# Patient Record
Sex: Female | Born: 1945
Health system: Southern US, Community
[De-identification: ages and names within clinical notes are randomized; demographics above are authoritative.]

## PROBLEM LIST (undated history)

## (undated) DIAGNOSIS — I639 Cerebral infarction, unspecified: Secondary | ICD-10-CM

## (undated) DIAGNOSIS — R9431 Abnormal electrocardiogram [ECG] [EKG]: Secondary | ICD-10-CM

## (undated) DIAGNOSIS — N189 Chronic kidney disease, unspecified: Secondary | ICD-10-CM

## (undated) DIAGNOSIS — R569 Unspecified convulsions: Secondary | ICD-10-CM

## (undated) DIAGNOSIS — J101 Influenza due to other identified influenza virus with other respiratory manifestations: Secondary | ICD-10-CM

## (undated) DIAGNOSIS — C50919 Malignant neoplasm of unspecified site of unspecified female breast: Secondary | ICD-10-CM

## (undated) DIAGNOSIS — K760 Fatty (change of) liver, not elsewhere classified: Secondary | ICD-10-CM

## (undated) DIAGNOSIS — I10 Essential (primary) hypertension: Secondary | ICD-10-CM

## (undated) DIAGNOSIS — T7840XA Allergy, unspecified, initial encounter: Secondary | ICD-10-CM

## (undated) DIAGNOSIS — K219 Gastro-esophageal reflux disease without esophagitis: Secondary | ICD-10-CM

## (undated) DIAGNOSIS — E7800 Pure hypercholesterolemia, unspecified: Secondary | ICD-10-CM

## (undated) DIAGNOSIS — Z923 Personal history of irradiation: Secondary | ICD-10-CM

## (undated) DIAGNOSIS — D352 Benign neoplasm of pituitary gland: Secondary | ICD-10-CM

## (undated) DIAGNOSIS — I4891 Unspecified atrial fibrillation: Secondary | ICD-10-CM

## (undated) DIAGNOSIS — M1990 Unspecified osteoarthritis, unspecified site: Secondary | ICD-10-CM

## (undated) DIAGNOSIS — E079 Disorder of thyroid, unspecified: Secondary | ICD-10-CM

## (undated) HISTORY — PX: UMBILICAL HERNIA REPAIR: SHX196

## (undated) HISTORY — PX: COLONOSCOPY: SHX174

## (undated) HISTORY — DX: Abnormal electrocardiogram (ECG) (EKG): R94.31

## (undated) HISTORY — PX: BREAST LUMPECTOMY: SHX2

## (undated) HISTORY — DX: Essential (primary) hypertension: I10

## (undated) HISTORY — DX: Unspecified atrial fibrillation: I48.91

## (undated) HISTORY — DX: Pure hypercholesterolemia, unspecified: E78.00

## (undated) HISTORY — DX: Fatty (change of) liver, not elsewhere classified: K76.0

## (undated) HISTORY — DX: Malignant neoplasm of unspecified site of unspecified female breast: C50.919

## (undated) HISTORY — DX: Unspecified convulsions: R56.9

## (undated) HISTORY — PX: PITUITARY SURGERY: SHX203

## (undated) HISTORY — DX: Cerebral infarction, unspecified: I63.9

## (undated) HISTORY — PX: SCAR REVISION: SHX5285

## (undated) HISTORY — DX: Gastro-esophageal reflux disease without esophagitis: K21.9

## (undated) HISTORY — DX: Disorder of thyroid, unspecified: E07.9

## (undated) HISTORY — DX: Influenza due to other identified influenza virus with other respiratory manifestations: J10.1

## (undated) HISTORY — DX: Chronic kidney disease, unspecified: N18.9

## (undated) HISTORY — PX: TUBAL LIGATION: SHX77

## (undated) HISTORY — DX: Allergy, unspecified, initial encounter: T78.40XA

## (undated) HISTORY — DX: Unspecified osteoarthritis, unspecified site: M19.90

---

## 1983-09-11 HISTORY — PX: RHINOPLASTY: SHX2354

## 1985-09-10 HISTORY — PX: BREAST ENHANCEMENT SURGERY: SHX7

## 2006-10-12 ENCOUNTER — Encounter: Admission: RE | Admit: 2006-10-12 | Discharge: 2006-10-12 | Payer: Self-pay | Admitting: Endocrinology

## 2007-10-30 ENCOUNTER — Encounter: Admission: RE | Admit: 2007-10-30 | Discharge: 2007-10-30 | Payer: Self-pay | Admitting: Endocrinology

## 2008-11-01 ENCOUNTER — Encounter: Admission: RE | Admit: 2008-11-01 | Discharge: 2008-11-01 | Payer: Self-pay | Admitting: Endocrinology

## 2009-02-28 ENCOUNTER — Ambulatory Visit: Payer: Self-pay | Admitting: Occupational Medicine

## 2009-02-28 DIAGNOSIS — L500 Allergic urticaria: Secondary | ICD-10-CM | POA: Insufficient documentation

## 2009-02-28 DIAGNOSIS — E785 Hyperlipidemia, unspecified: Secondary | ICD-10-CM | POA: Insufficient documentation

## 2009-02-28 DIAGNOSIS — I10 Essential (primary) hypertension: Secondary | ICD-10-CM | POA: Insufficient documentation

## 2009-09-10 DIAGNOSIS — C50919 Malignant neoplasm of unspecified site of unspecified female breast: Secondary | ICD-10-CM

## 2009-09-10 HISTORY — DX: Malignant neoplasm of unspecified site of unspecified female breast: C50.919

## 2009-10-31 ENCOUNTER — Encounter: Admission: RE | Admit: 2009-10-31 | Discharge: 2009-10-31 | Payer: Self-pay | Admitting: Internal Medicine

## 2009-11-08 ENCOUNTER — Encounter: Admission: RE | Admit: 2009-11-08 | Discharge: 2009-11-08 | Payer: Self-pay | Admitting: Internal Medicine

## 2009-11-16 ENCOUNTER — Ambulatory Visit: Payer: Self-pay | Admitting: Oncology

## 2009-11-23 LAB — CANCER ANTIGEN 27.29: CA 27.29: 26 U/mL (ref 0–39)

## 2009-11-23 LAB — CBC WITH DIFFERENTIAL/PLATELET
BASO%: 0.7 % (ref 0.0–2.0)
EOS%: 1.8 % (ref 0.0–7.0)
LYMPH%: 25.3 % (ref 14.0–49.7)
MCHC: 35.5 g/dL (ref 31.5–36.0)
MONO#: 0.7 10*3/uL (ref 0.1–0.9)
RBC: 4.57 10*6/uL (ref 3.70–5.45)
WBC: 7.2 10*3/uL (ref 3.9–10.3)
lymph#: 1.8 10*3/uL (ref 0.9–3.3)

## 2009-11-23 LAB — COMPREHENSIVE METABOLIC PANEL
ALT: 65 U/L — ABNORMAL HIGH (ref 0–35)
AST: 49 U/L — ABNORMAL HIGH (ref 0–37)
CO2: 30 mEq/L (ref 19–32)
Chloride: 101 mEq/L (ref 96–112)
Sodium: 138 mEq/L (ref 135–145)
Total Bilirubin: 1.1 mg/dL (ref 0.3–1.2)
Total Protein: 7.3 g/dL (ref 6.0–8.3)

## 2009-12-01 ENCOUNTER — Ambulatory Visit (HOSPITAL_COMMUNITY): Admission: RE | Admit: 2009-12-01 | Discharge: 2009-12-01 | Payer: Self-pay | Admitting: Surgery

## 2009-12-08 ENCOUNTER — Ambulatory Visit (HOSPITAL_COMMUNITY): Admission: RE | Admit: 2009-12-08 | Discharge: 2009-12-08 | Payer: Self-pay | Admitting: Oncology

## 2009-12-15 ENCOUNTER — Ambulatory Visit: Payer: Self-pay | Admitting: Oncology

## 2009-12-15 LAB — CBC WITH DIFFERENTIAL/PLATELET
BASO%: 1.3 % (ref 0.0–2.0)
Basophils Absolute: 0.1 10*3/uL (ref 0.0–0.1)
EOS%: 1.5 % (ref 0.0–7.0)
HGB: 15.7 g/dL (ref 11.6–15.9)
MCH: 33.7 pg (ref 25.1–34.0)
MONO%: 26.8 % — ABNORMAL HIGH (ref 0.0–14.0)
RBC: 4.66 10*6/uL (ref 3.70–5.45)
RDW: 11.8 % (ref 11.2–14.5)
lymph#: 1.2 10*3/uL (ref 0.9–3.3)
nRBC: 0 % (ref 0–0)

## 2009-12-22 LAB — CBC WITH DIFFERENTIAL/PLATELET
BASO%: 1.1 % (ref 0.0–2.0)
Eosinophils Absolute: 0 10*3/uL (ref 0.0–0.5)
MCHC: 34.7 g/dL (ref 31.5–36.0)
MONO#: 0.7 10*3/uL (ref 0.1–0.9)
NEUT#: 8.5 10*3/uL — ABNORMAL HIGH (ref 1.5–6.5)
RBC: 3.94 10*6/uL (ref 3.70–5.45)
RDW: 12.3 % (ref 11.2–14.5)
WBC: 11.1 10*3/uL — ABNORMAL HIGH (ref 3.9–10.3)
lymph#: 1.7 10*3/uL (ref 0.9–3.3)

## 2009-12-22 LAB — URINALYSIS, MICROSCOPIC - CHCC
Bilirubin (Urine): NEGATIVE
Glucose: NEGATIVE g/dL
Nitrite: NEGATIVE
Specific Gravity, Urine: 1.01 (ref 1.003–1.035)

## 2009-12-23 LAB — URINE CULTURE

## 2009-12-29 LAB — CBC WITH DIFFERENTIAL/PLATELET
BASO%: 0 % (ref 0.0–2.0)
Basophils Absolute: 0 10*3/uL (ref 0.0–0.1)
EOS%: 0 % (ref 0.0–7.0)
HCT: 42.4 % (ref 34.8–46.6)
HGB: 14.8 g/dL (ref 11.6–15.9)
MCH: 33.6 pg (ref 25.1–34.0)
MCHC: 34.9 g/dL (ref 31.5–36.0)
MCV: 96.4 fL (ref 79.5–101.0)
MONO%: 4.7 % (ref 0.0–14.0)
NEUT%: 90.3 % — ABNORMAL HIGH (ref 38.4–76.8)

## 2009-12-29 LAB — COMPREHENSIVE METABOLIC PANEL
ALT: 51 U/L — ABNORMAL HIGH (ref 0–35)
AST: 31 U/L (ref 0–37)
Albumin: 4.2 g/dL (ref 3.5–5.2)
Alkaline Phosphatase: 153 U/L — ABNORMAL HIGH (ref 39–117)
BUN: 14 mg/dL (ref 6–23)
Calcium: 9.5 mg/dL (ref 8.4–10.5)
Chloride: 100 mEq/L (ref 96–112)
Creatinine, Ser: 0.76 mg/dL (ref 0.40–1.20)
Potassium: 3.9 mEq/L (ref 3.5–5.3)

## 2009-12-29 LAB — URINALYSIS, MICROSCOPIC - CHCC
Leukocyte Esterase: NEGATIVE
Nitrite: NEGATIVE
Protein: NEGATIVE mg/dL
RBC count: NEGATIVE (ref 0–2)
pH: 6.5 (ref 4.6–8.0)

## 2010-01-05 LAB — CBC WITH DIFFERENTIAL/PLATELET
Basophils Absolute: 0.2 10*3/uL — ABNORMAL HIGH (ref 0.0–0.1)
EOS%: 1.8 % (ref 0.0–7.0)
HCT: 42 % (ref 34.8–46.6)
HGB: 14.8 g/dL (ref 11.6–15.9)
LYMPH%: 30.7 % (ref 14.0–49.7)
MCH: 34 pg (ref 25.1–34.0)
MCV: 96.6 fL (ref 79.5–101.0)
NEUT%: 43.1 % (ref 38.4–76.8)
Platelets: 171 10*3/uL (ref 145–400)
lymph#: 1.5 10*3/uL (ref 0.9–3.3)

## 2010-01-18 ENCOUNTER — Ambulatory Visit: Payer: Self-pay | Admitting: Oncology

## 2010-01-19 ENCOUNTER — Ambulatory Visit (HOSPITAL_COMMUNITY): Admission: RE | Admit: 2010-01-19 | Discharge: 2010-01-19 | Payer: Self-pay | Admitting: Oncology

## 2010-01-19 LAB — CBC WITH DIFFERENTIAL/PLATELET
Basophils Absolute: 0 10*3/uL (ref 0.0–0.1)
EOS%: 0 % (ref 0.0–7.0)
HGB: 14.4 g/dL (ref 11.6–15.9)
MCH: 34.2 pg — ABNORMAL HIGH (ref 25.1–34.0)
MCV: 96.2 fL (ref 79.5–101.0)
MONO%: 5.3 % (ref 0.0–14.0)
NEUT%: 89.8 % — ABNORMAL HIGH (ref 38.4–76.8)
RDW: 13.9 % (ref 11.2–14.5)

## 2010-01-19 LAB — COMPREHENSIVE METABOLIC PANEL
ALT: 38 U/L — ABNORMAL HIGH (ref 0–35)
CO2: 21 mEq/L (ref 19–32)
Calcium: 9 mg/dL (ref 8.4–10.5)
Chloride: 100 mEq/L (ref 96–112)
Sodium: 137 mEq/L (ref 135–145)
Total Protein: 6.6 g/dL (ref 6.0–8.3)

## 2010-01-26 LAB — CBC WITH DIFFERENTIAL/PLATELET
BASO%: 0.9 % (ref 0.0–2.0)
EOS%: 2.1 % (ref 0.0–7.0)
LYMPH%: 37.8 % (ref 14.0–49.7)
MCH: 34 pg (ref 25.1–34.0)
MCHC: 35.8 g/dL (ref 31.5–36.0)
MONO#: 0.8 10*3/uL (ref 0.1–0.9)
Platelets: 145 10*3/uL (ref 145–400)
RBC: 4.35 10*6/uL (ref 3.70–5.45)
WBC: 3.3 10*3/uL — ABNORMAL LOW (ref 3.9–10.3)
lymph#: 1.2 10*3/uL (ref 0.9–3.3)
nRBC: 1 % — ABNORMAL HIGH (ref 0–0)

## 2010-02-08 ENCOUNTER — Ambulatory Visit: Payer: Self-pay | Admitting: Oncology

## 2010-02-09 LAB — CBC WITH DIFFERENTIAL/PLATELET
Basophils Absolute: 0 10*3/uL (ref 0.0–0.1)
EOS%: 0.2 % (ref 0.0–7.0)
Eosinophils Absolute: 0 10*3/uL (ref 0.0–0.5)
LYMPH%: 3 % — ABNORMAL LOW (ref 14.0–49.7)
MCH: 34.2 pg — ABNORMAL HIGH (ref 25.1–34.0)
MCV: 99.8 fL (ref 79.5–101.0)
MONO%: 2.7 % (ref 0.0–14.0)
NEUT#: 14.4 10*3/uL — ABNORMAL HIGH (ref 1.5–6.5)
Platelets: 235 10*3/uL (ref 145–400)
RBC: 4.09 10*6/uL (ref 3.70–5.45)
nRBC: 0 % (ref 0–0)

## 2010-02-09 LAB — COMPREHENSIVE METABOLIC PANEL
AST: 23 U/L (ref 0–37)
Alkaline Phosphatase: 125 U/L — ABNORMAL HIGH (ref 39–117)
Glucose, Bld: 204 mg/dL — ABNORMAL HIGH (ref 70–99)
Potassium: 4.2 mEq/L (ref 3.5–5.3)
Sodium: 136 mEq/L (ref 135–145)
Total Bilirubin: 0.7 mg/dL (ref 0.3–1.2)
Total Protein: 6.3 g/dL (ref 6.0–8.3)

## 2010-02-15 ENCOUNTER — Encounter: Admission: RE | Admit: 2010-02-15 | Discharge: 2010-02-15 | Payer: Self-pay | Admitting: Oncology

## 2010-03-02 LAB — COMPREHENSIVE METABOLIC PANEL
ALT: 38 U/L — ABNORMAL HIGH (ref 0–35)
AST: 30 U/L (ref 0–37)
Alkaline Phosphatase: 125 U/L — ABNORMAL HIGH (ref 39–117)
Creatinine, Ser: 0.76 mg/dL (ref 0.40–1.20)
Total Bilirubin: 1 mg/dL (ref 0.3–1.2)

## 2010-03-02 LAB — CBC WITH DIFFERENTIAL/PLATELET
BASO%: 0 % (ref 0.0–2.0)
EOS%: 0 % (ref 0.0–7.0)
HCT: 38 % (ref 34.8–46.6)
LYMPH%: 3.5 % — ABNORMAL LOW (ref 14.0–49.7)
MCH: 34.6 pg — ABNORMAL HIGH (ref 25.1–34.0)
MCHC: 35 g/dL (ref 31.5–36.0)
NEUT%: 93.1 % — ABNORMAL HIGH (ref 38.4–76.8)
Platelets: 219 10*3/uL (ref 145–400)
RBC: 3.84 10*6/uL (ref 3.70–5.45)

## 2010-03-09 LAB — CBC WITH DIFFERENTIAL/PLATELET
BASO%: 0.8 % (ref 0.0–2.0)
HCT: 39.4 % (ref 34.8–46.6)
LYMPH%: 35.2 % (ref 14.0–49.7)
MCH: 34.3 pg — ABNORMAL HIGH (ref 25.1–34.0)
MCHC: 34.8 g/dL (ref 31.5–36.0)
MCV: 98.5 fL (ref 79.5–101.0)
MONO#: 0.8 10*3/uL (ref 0.1–0.9)
NEUT%: 32.2 % — ABNORMAL LOW (ref 38.4–76.8)
Platelets: 121 10*3/uL — ABNORMAL LOW (ref 145–400)

## 2010-03-23 ENCOUNTER — Ambulatory Visit: Payer: Self-pay | Admitting: Oncology

## 2010-03-23 LAB — COMPREHENSIVE METABOLIC PANEL
ALT: 36 U/L — ABNORMAL HIGH (ref 0–35)
AST: 32 U/L (ref 0–37)
Albumin: 4.1 g/dL (ref 3.5–5.2)
Alkaline Phosphatase: 129 U/L — ABNORMAL HIGH (ref 39–117)
BUN: 14 mg/dL (ref 6–23)
Calcium: 9 mg/dL (ref 8.4–10.5)
Chloride: 100 mEq/L (ref 96–112)
Creatinine, Ser: 0.65 mg/dL (ref 0.40–1.20)
Potassium: 4.4 mEq/L (ref 3.5–5.3)

## 2010-03-23 LAB — CBC WITH DIFFERENTIAL/PLATELET
BASO%: 0.2 % (ref 0.0–2.0)
Basophils Absolute: 0 10*3/uL (ref 0.0–0.1)
EOS%: 0 % (ref 0.0–7.0)
HCT: 41.1 % (ref 34.8–46.6)
HGB: 14.2 g/dL (ref 11.6–15.9)
MCH: 34.9 pg — ABNORMAL HIGH (ref 25.1–34.0)
MCHC: 34.5 g/dL (ref 31.5–36.0)
MCV: 101 fL (ref 79.5–101.0)
MONO%: 3 % (ref 0.0–14.0)
NEUT%: 92.2 % — ABNORMAL HIGH (ref 38.4–76.8)
lymph#: 0.4 10*3/uL — ABNORMAL LOW (ref 0.9–3.3)

## 2010-03-30 LAB — CBC WITH DIFFERENTIAL/PLATELET
BASO%: 0.8 % (ref 0.0–2.0)
EOS%: 0.6 % (ref 0.0–7.0)
LYMPH%: 28.8 % (ref 14.0–49.7)
MCH: 34.6 pg — ABNORMAL HIGH (ref 25.1–34.0)
MCHC: 34.4 g/dL (ref 31.5–36.0)
MCV: 100.5 fL (ref 79.5–101.0)
MONO%: 24 % — ABNORMAL HIGH (ref 0.0–14.0)
Platelets: 124 10*3/uL — ABNORMAL LOW (ref 145–400)
RBC: 4.05 10*6/uL (ref 3.70–5.45)
RDW: 14.4 % (ref 11.2–14.5)

## 2010-04-10 ENCOUNTER — Encounter: Admission: RE | Admit: 2010-04-10 | Discharge: 2010-04-10 | Payer: Self-pay | Admitting: Surgery

## 2010-04-13 ENCOUNTER — Ambulatory Visit (HOSPITAL_BASED_OUTPATIENT_CLINIC_OR_DEPARTMENT_OTHER): Admission: RE | Admit: 2010-04-13 | Discharge: 2010-04-14 | Payer: Self-pay | Admitting: Surgery

## 2010-04-13 ENCOUNTER — Encounter: Admission: RE | Admit: 2010-04-13 | Discharge: 2010-04-13 | Payer: Self-pay | Admitting: Surgery

## 2010-04-28 ENCOUNTER — Ambulatory Visit: Payer: Self-pay | Admitting: Oncology

## 2010-04-28 ENCOUNTER — Ambulatory Visit: Admission: RE | Admit: 2010-04-28 | Discharge: 2010-04-28 | Payer: Self-pay | Admitting: Oncology

## 2010-04-28 ENCOUNTER — Encounter: Payer: Self-pay | Admitting: Oncology

## 2010-04-28 ENCOUNTER — Ambulatory Visit: Payer: Self-pay | Admitting: Surgery

## 2010-04-28 LAB — URINALYSIS, MICROSCOPIC - CHCC
Blood: NEGATIVE
Glucose: NEGATIVE g/dL
Protein: NEGATIVE mg/dL
pH: 6 (ref 4.6–8.0)

## 2010-04-28 LAB — CBC WITH DIFFERENTIAL/PLATELET
BASO%: 0.9 % (ref 0.0–2.0)
Basophils Absolute: 0.1 10*3/uL (ref 0.0–0.1)
Eosinophils Absolute: 0.2 10*3/uL (ref 0.0–0.5)
MCH: 35.8 pg — ABNORMAL HIGH (ref 25.1–34.0)
MCHC: 35.2 g/dL (ref 31.5–36.0)
RBC: 3.91 10*6/uL (ref 3.70–5.45)
RDW: 14.6 % — ABNORMAL HIGH (ref 11.2–14.5)
lymph#: 0.8 10*3/uL — ABNORMAL LOW (ref 0.9–3.3)

## 2010-04-28 LAB — COMPREHENSIVE METABOLIC PANEL
AST: 27 U/L (ref 0–37)
Alkaline Phosphatase: 116 U/L (ref 39–117)
Chloride: 101 mEq/L (ref 96–112)
Creatinine, Ser: 0.7 mg/dL (ref 0.40–1.20)
Glucose, Bld: 83 mg/dL (ref 70–99)
Total Protein: 6.4 g/dL (ref 6.0–8.3)

## 2010-04-30 LAB — URINE CULTURE

## 2010-05-02 ENCOUNTER — Ambulatory Visit: Admission: RE | Admit: 2010-05-02 | Discharge: 2010-07-11 | Payer: Self-pay | Admitting: Radiation Oncology

## 2010-05-09 LAB — CBC WITH DIFFERENTIAL/PLATELET
BASO%: 1.1 % (ref 0.0–2.0)
EOS%: 3.5 % (ref 0.0–7.0)
MCH: 34.6 pg — ABNORMAL HIGH (ref 25.1–34.0)
MCHC: 34.2 g/dL (ref 31.5–36.0)
MCV: 101.2 fL — ABNORMAL HIGH (ref 79.5–101.0)
MONO%: 11.8 % (ref 0.0–14.0)
RDW: 13.6 % (ref 11.2–14.5)
lymph#: 0.9 10*3/uL (ref 0.9–3.3)

## 2010-05-15 ENCOUNTER — Ambulatory Visit: Payer: Self-pay | Admitting: Family Medicine

## 2010-05-15 DIAGNOSIS — N3000 Acute cystitis without hematuria: Secondary | ICD-10-CM | POA: Insufficient documentation

## 2010-05-15 LAB — CONVERTED CEMR LAB
Glucose, Urine, Semiquant: NEGATIVE
Ketones, urine, test strip: NEGATIVE
Nitrite: NEGATIVE
Protein, U semiquant: NEGATIVE
Urobilinogen, UA: 0.2

## 2010-05-16 ENCOUNTER — Encounter: Payer: Self-pay | Admitting: Family Medicine

## 2010-05-18 ENCOUNTER — Telehealth (INDEPENDENT_AMBULATORY_CARE_PROVIDER_SITE_OTHER): Payer: Self-pay | Admitting: *Deleted

## 2010-07-06 ENCOUNTER — Ambulatory Visit: Payer: Self-pay | Admitting: Oncology

## 2010-07-10 LAB — CBC WITH DIFFERENTIAL/PLATELET
BASO%: 0.8 % (ref 0.0–2.0)
Basophils Absolute: 0 10*3/uL (ref 0.0–0.1)
EOS%: 8.6 % — ABNORMAL HIGH (ref 0.0–7.0)
Eosinophils Absolute: 0.4 10*3/uL (ref 0.0–0.5)
HCT: 43 % (ref 34.8–46.6)
HGB: 15 g/dL (ref 11.6–15.9)
LYMPH%: 12.7 % — ABNORMAL LOW (ref 14.0–49.7)
MCH: 33 pg (ref 25.1–34.0)
MCHC: 34.9 g/dL (ref 31.5–36.0)
MCV: 94.7 fL (ref 79.5–101.0)
MONO#: 0.7 10*3/uL (ref 0.1–0.9)
MONO%: 15.1 % — ABNORMAL HIGH (ref 0.0–14.0)
NEUT#: 3.1 10*3/uL (ref 1.5–6.5)
NEUT%: 62.8 % (ref 38.4–76.8)
Platelets: 146 10*3/uL (ref 145–400)
RBC: 4.54 10*6/uL (ref 3.70–5.45)
RDW: 12.6 % (ref 11.2–14.5)
WBC: 4.9 10*3/uL (ref 3.9–10.3)
lymph#: 0.6 10*3/uL — ABNORMAL LOW (ref 0.9–3.3)
nRBC: 0 % (ref 0–0)

## 2010-09-19 ENCOUNTER — Ambulatory Visit: Payer: Self-pay | Admitting: Oncology

## 2010-09-21 ENCOUNTER — Encounter
Admission: RE | Admit: 2010-09-21 | Discharge: 2010-09-21 | Payer: Self-pay | Source: Home / Self Care | Attending: Oncology | Admitting: Oncology

## 2010-09-21 LAB — COMPREHENSIVE METABOLIC PANEL
ALT: 37 U/L — ABNORMAL HIGH (ref 0–35)
AST: 32 U/L (ref 0–37)
Albumin: 3.6 g/dL (ref 3.5–5.2)
Alkaline Phosphatase: 131 U/L — ABNORMAL HIGH (ref 39–117)
BUN: 12 mg/dL (ref 6–23)
CO2: 32 mEq/L (ref 19–32)
Calcium: 9.1 mg/dL (ref 8.4–10.5)
Chloride: 101 mEq/L (ref 96–112)
Creatinine, Ser: 0.65 mg/dL (ref 0.40–1.20)
Glucose, Bld: 109 mg/dL — ABNORMAL HIGH (ref 70–99)
Potassium: 3.8 mEq/L (ref 3.5–5.3)
Sodium: 142 mEq/L (ref 135–145)
Total Bilirubin: 0.6 mg/dL (ref 0.3–1.2)
Total Protein: 6.7 g/dL (ref 6.0–8.3)

## 2010-09-21 LAB — CBC WITH DIFFERENTIAL/PLATELET
BASO%: 0.2 % (ref 0.0–2.0)
Basophils Absolute: 0 10*3/uL (ref 0.0–0.1)
EOS%: 4.1 % (ref 0.0–7.0)
Eosinophils Absolute: 0.2 10*3/uL (ref 0.0–0.5)
HCT: 39.8 % (ref 34.8–46.6)
HGB: 14 g/dL (ref 11.6–15.9)
LYMPH%: 16.1 % (ref 14.0–49.7)
MCH: 34.7 pg — ABNORMAL HIGH (ref 25.1–34.0)
MCHC: 35.2 g/dL (ref 31.5–36.0)
MCV: 98.5 fL (ref 79.5–101.0)
MONO#: 0.6 10*3/uL (ref 0.1–0.9)
MONO%: 11.5 % (ref 0.0–14.0)
NEUT#: 3.6 10*3/uL (ref 1.5–6.5)
NEUT%: 68.1 % (ref 38.4–76.8)
Platelets: 194 10*3/uL (ref 145–400)
RBC: 4.04 10*6/uL (ref 3.70–5.45)
RDW: 12.4 % (ref 11.2–14.5)
WBC: 5.3 10*3/uL (ref 3.9–10.3)
lymph#: 0.8 10*3/uL — ABNORMAL LOW (ref 0.9–3.3)

## 2010-09-21 LAB — CANCER ANTIGEN 27.29: CA 27.29: 23 U/mL (ref 0–39)

## 2010-09-30 ENCOUNTER — Encounter: Payer: Self-pay | Admitting: Oncology

## 2010-10-01 ENCOUNTER — Encounter: Payer: Self-pay | Admitting: Endocrinology

## 2010-10-01 ENCOUNTER — Encounter: Payer: Self-pay | Admitting: Oncology

## 2010-10-01 ENCOUNTER — Encounter: Payer: Self-pay | Admitting: Internal Medicine

## 2010-10-02 ENCOUNTER — Other Ambulatory Visit: Payer: Self-pay | Admitting: Oncology

## 2010-10-02 DIAGNOSIS — Z9889 Other specified postprocedural states: Secondary | ICD-10-CM

## 2010-10-12 NOTE — Progress Notes (Signed)
  Phone Note Outgoing Call Call back at Work Phone [phone]   Call placed by: Lajean Saver RN,  May 18, 2010 9:45 AM Call placed to: Patient Action Taken: Phone Call Completed Summary of Call: Call back: patient informed of lab results and advised to finish antibiotic. PLease call back wit any questions.

## 2010-10-12 NOTE — Assessment & Plan Note (Signed)
Summary: Frequent,burning,painful urination x 1 dy rm 2   Vital Signs:  Patient Profile:   65 Years Old Female CC:      Frequent,painful urination x Height:     64 inches Weight:      200 pounds O2 Sat:      99 % O2 treatment:    Room Air Temp:     98.1 degrees F oral Pulse rate:   66 / minute Pulse rhythm:   regular Resp:     16 per minute BP sitting:   111 / 67  (left arm) Cuff size:   regular  Vitals Entered By: Areta Haber CMA (May 15, 2010 10:33 AM)                  Current Allergies: ! * TESSALON ! CODEINE      History of Present Illness Chief Complaint: Frequent,painful urination x History of Present Illness:  Subjective:  Patient presents complaining of UTI symptoms for 4 days.  Complains of dysuria, frequency, nocturia, mild hematuria, and urgency.   No abnormal vaginal discharge.  No fever/chills/sweats.  No abdominal pain.  No flank pain.  No nausea/vomiting.   She states that she was treated for a UTI 2 weeks ago with amoxicillin with resolution of symptoms.  Current Problems: ACUTE CYSTITIS (ICD-595.0) ALLERGIC URTICARIA (ICD-708.0) FAMILY HISTORY SEIZURES (ICD-V17.2) HYPERTENSION (ICD-401.9) HYPERLIPIDEMIA (ICD-272.4)   Current Meds AMLODIPINE BESYLATE 10 MG TABS (AMLODIPINE BESYLATE) 1 tab by mouth once daily ATENOLOL 25 MG TABS (ATENOLOL) 1 tab by mouth once daily TRIAMTERENE-HCTZ 37.5-25 MG TABS (TRIAMTERENE-HCTZ) 1 tab by mouth once daily RAMIPRIL 10 MG CAPS (RAMIPRIL) 1 tab po once daily ASPIRIN 325 MG TABS (ASPIRIN) 1 tab by mouth two times a day CRANBERRY 250 MG CAPS (CRANBERRY) 1 tab by mouth two times a day CIPROFLOXACIN HCL 250 MG TABS (CIPROFLOXACIN HCL) 1 po two times a day  REVIEW OF SYSTEMS Constitutional Symptoms      Denies fever, chills, night sweats, weight loss, weight gain, and fatigue.  Eyes       Denies change in vision, eye pain, eye discharge, glasses, contact lenses, and eye  surgery. Ear/Nose/Throat/Mouth       Denies hearing loss/aids, change in hearing, ear pain, ear discharge, dizziness, frequent runny nose, frequent nose bleeds, sinus problems, sore throat, hoarseness, and tooth pain or bleeding.  Respiratory       Denies dry cough, productive cough, wheezing, shortness of breath, asthma, bronchitis, and emphysema/COPD.  Cardiovascular       Denies murmurs, chest pain, and tires easily with exhertion.    Gastrointestinal       Denies stomach pain, nausea/vomiting, diarrhea, constipation, blood in bowel movements, and indigestion. Genitourniary       Complains of painful urination.      Denies kidney stones and loss of urinary control.      Comments: Frequent, burning x 1 dy Neurological       Denies paralysis, seizures, and fainting/blackouts. Musculoskeletal       Denies muscle pain, joint pain, joint stiffness, decreased range of motion, redness, swelling, muscle weakness, and gout.  Skin       Denies bruising, unusual mles/lumps or sores, and hair/skin or nail changes.  Psych       Denies mood changes, temper/anger issues, anxiety/stress, speech problems, depression, and sleep problems. Other Comments: Pt states she was seen by her Oncologist, Dx UTI, Tx w/ Amox 500mg  1 tab by mouth two times a day  x 7 dys which completed on 05/09/10. Pt states her symptoms returned 05/14/10.   Past History:  Past Medical History: Last updated: 02/28/2009 Hyperlipidemia Hypertension  Past Surgical History: Last updated: 02/28/2009 Caesarean section Pituitary adenomectomy Mammoplasty  Family History: Last updated: 02/28/2009 Family History Seizures Family History of Cardiovascular disorder - MI, CHF  Social History: Last updated: 02/28/2009 Married Never Smoked Alcohol use-no Drug use-no Regular exercise-no  Risk Factors: Exercise: no (02/28/2009)  Risk Factors: Smoking Status: never (02/28/2009)   Objective:  No acute distress  Mouth:   moist mucous membranes  Neck:  Supple.  No adenopathy is present.   Lungs:  Clear to auscultation.  Breath sounds are equal.  Heart:  Regular rate and rhythm without murmurs, rubs, or gallops.  Abdomen:  Nontender without masses or hepatosplenomegaly.  Bowel sounds are present.  No CVA or flank tenderness.  urinalysis (dipstick):  1+ leuks Assessment New Problems: ACUTE CYSTITIS (ICD-595.0)   Plan New Medications/Changes: CIPROFLOXACIN HCL 250 MG TABS (CIPROFLOXACIN HCL) 1 po two times a day  #14 x 0, 05/15/2010, Donna Christen MD  New Orders: Urinalysis [[id number]] T-Culture, Urine [[id number]] Est. Patient Level III [52841] Planning Comments:   Urine culture pending.  Begin Cipro.  Continue increased fluids. Follow-up with PCP if not improving.  If symptoms become significantly worse during the night  proceed to the local emergency room.   The patient and/or caregiver has been counseled thoroughly with regard to medications prescribed including dosage, schedule, interactions, rationale for use, and possible side effects and they verbalize understanding.  Diagnoses and expected course of recovery discussed and will return if not improved as expected or if the condition worsens. Patient and/or caregiver verbalized understanding.  Prescriptions: CIPROFLOXACIN HCL 250 MG TABS (CIPROFLOXACIN HCL) 1 po two times a day  #14 x 0   Entered and Authorized by:   Donna Christen MD   Signed by:   Donna Christen MD on 05/15/2010   Method used:   Print then Give to Patient   RxID:   [id number]   Orders Added: 1)  Urinalysis [[id number]] 2)  T-Culture, Urine [[id number]] 3)  Est. Patient Level III [38756]  Laboratory Results   Urine Tests  Date/Time Received: May 15, 2010 10:57 AM  Date/Time Reported: May 15, 2010 10:57 AM   Routine Urinalysis   Color: lt. yellow Appearance: Clear Glucose: negative   (Normal Range: Negative) Bilirubin: negative   (Normal Range:  Negative) Ketone: negative   (Normal Range: Negative) Spec. Gravity: <1.005   (Normal Range: 1.003-1.035) Blood: negative   (Normal Range: Negative) pH: 6.0   (Normal Range: 5.0-8.0) Protein: negative   (Normal Range: Negative) Urobilinogen: 0.2   (Normal Range: 0-1) Nitrite: negative   (Normal Range: Negative) Leukocyte Esterace: trace   (Normal Range: Negative)

## 2010-11-01 ENCOUNTER — Ambulatory Visit
Admission: RE | Admit: 2010-11-01 | Discharge: 2010-11-01 | Disposition: A | Discharge status: Home / Self Care | Payer: 59 | Source: Ambulatory Visit | Attending: Oncology | Admitting: Oncology

## 2010-11-01 ENCOUNTER — Other Ambulatory Visit: Payer: Self-pay | Admitting: Oncology

## 2010-11-01 DIAGNOSIS — Z9889 Other specified postprocedural states: Secondary | ICD-10-CM

## 2010-11-09 ENCOUNTER — Ambulatory Visit: Payer: 59 | Admitting: Radiation Oncology

## 2010-11-24 LAB — URINALYSIS, ROUTINE W REFLEX MICROSCOPIC
Bilirubin Urine: NEGATIVE
Hgb urine dipstick: NEGATIVE
Ketones, ur: NEGATIVE mg/dL
Nitrite: NEGATIVE
pH: 7 (ref 5.0–8.0)

## 2010-11-24 LAB — CBC
HCT: 39 % (ref 36.0–46.0)
MCHC: 35 g/dL (ref 30.0–36.0)
RDW: 15.7 % — ABNORMAL HIGH (ref 11.5–15.5)

## 2010-11-24 LAB — COMPREHENSIVE METABOLIC PANEL
BUN: 7 mg/dL (ref 6–23)
Calcium: 9 mg/dL (ref 8.4–10.5)
Glucose, Bld: 119 mg/dL — ABNORMAL HIGH (ref 70–99)
Sodium: 138 mEq/L (ref 135–145)
Total Protein: 5.6 g/dL — ABNORMAL LOW (ref 6.0–8.3)

## 2010-11-24 LAB — DIFFERENTIAL
Lymphs Abs: 0.8 10*3/uL (ref 0.7–4.0)
Monocytes Relative: 9 % (ref 3–12)
Neutro Abs: 5.1 10*3/uL (ref 1.7–7.7)
Neutrophils Relative %: 78 % — ABNORMAL HIGH (ref 43–77)

## 2010-12-03 ENCOUNTER — Emergency Department (HOSPITAL_COMMUNITY): Payer: Self-pay

## 2010-12-03 ENCOUNTER — Emergency Department (HOSPITAL_COMMUNITY)
Admission: EM | Admit: 2010-12-03 | Discharge: 2010-12-03 | Disposition: A | Discharge status: Home / Self Care | Payer: Self-pay | Attending: Emergency Medicine | Admitting: Emergency Medicine

## 2010-12-03 DIAGNOSIS — Z853 Personal history of malignant neoplasm of breast: Secondary | ICD-10-CM | POA: Insufficient documentation

## 2010-12-03 DIAGNOSIS — R002 Palpitations: Secondary | ICD-10-CM | POA: Insufficient documentation

## 2010-12-03 DIAGNOSIS — Z79899 Other long term (current) drug therapy: Secondary | ICD-10-CM | POA: Insufficient documentation

## 2010-12-03 DIAGNOSIS — R911 Solitary pulmonary nodule: Secondary | ICD-10-CM | POA: Insufficient documentation

## 2010-12-03 DIAGNOSIS — Z7982 Long term (current) use of aspirin: Secondary | ICD-10-CM | POA: Insufficient documentation

## 2010-12-03 DIAGNOSIS — R0609 Other forms of dyspnea: Secondary | ICD-10-CM | POA: Insufficient documentation

## 2010-12-03 DIAGNOSIS — R0989 Other specified symptoms and signs involving the circulatory and respiratory systems: Secondary | ICD-10-CM | POA: Insufficient documentation

## 2010-12-03 DIAGNOSIS — I4891 Unspecified atrial fibrillation: Secondary | ICD-10-CM | POA: Insufficient documentation

## 2010-12-03 DIAGNOSIS — R0602 Shortness of breath: Secondary | ICD-10-CM | POA: Insufficient documentation

## 2010-12-03 LAB — COMPREHENSIVE METABOLIC PANEL
AST: 59 U/L — ABNORMAL HIGH (ref 0–37)
Alkaline Phosphatase: 134 U/L — ABNORMAL HIGH (ref 39–117)
CO2: 27 mEq/L (ref 19–32)
Chloride: 103 mEq/L (ref 96–112)
Creatinine, Ser: 0.79 mg/dL (ref 0.4–1.2)
GFR calc Af Amer: >60 mL/min (ref >60)
GFR calc non Af Amer: >60 mL/min (ref >60)
Potassium: 4.4 mEq/L (ref 3.5–5.1)
Total Bilirubin: 0.7 mg/dL (ref 0.3–1.2)

## 2010-12-03 LAB — DIFFERENTIAL
Lymphocytes Relative: 21 % (ref 12–46)
Monocytes Absolute: 0.6 10*3/uL (ref 0.1–1.0)
Monocytes Relative: 10 % (ref 3–12)
Neutro Abs: 3.6 10*3/uL (ref 1.7–7.7)

## 2010-12-03 LAB — POCT I-STAT, CHEM 8
Creatinine, Ser: 1 mg/dL (ref 0.4–1.2)
Glucose, Bld: 145 mg/dL — ABNORMAL HIGH (ref 70–99)
Hemoglobin: 15 g/dL (ref 12.0–15.0)
Potassium: 4.5 mEq/L (ref 3.5–5.1)
TCO2: 26 mmol/L (ref 0–100)

## 2010-12-03 LAB — CBC
HCT: 42.8 % (ref 36.0–46.0)
Hemoglobin: 15.4 g/dL — ABNORMAL HIGH (ref 12.0–15.0)
MCH: 34.7 pg — ABNORMAL HIGH (ref 26.0–34.0)
MCHC: 36 g/dL (ref 30.0–36.0)

## 2010-12-04 LAB — BASIC METABOLIC PANEL
BUN: 13 mg/dL (ref 6–23)
Calcium: 9.6 mg/dL (ref 8.4–10.5)
Creatinine, Ser: 0.84 mg/dL (ref 0.4–1.2)
GFR calc non Af Amer: >60 mL/min (ref >60)
Potassium: 4.1 mEq/L (ref 3.5–5.1)

## 2010-12-04 LAB — CBC
Platelets: 176 10*3/uL (ref 150–400)
RDW: 12.1 % (ref 11.5–15.5)
WBC: 7.4 10*3/uL (ref 4.0–10.5)

## 2011-01-16 ENCOUNTER — Ambulatory Visit (HOSPITAL_COMMUNITY)
Admission: RE | Admit: 2011-01-16 | Discharge: 2011-01-16 | Disposition: A | Discharge status: Home / Self Care | Payer: Self-pay | Source: Ambulatory Visit | Attending: Oncology | Admitting: Oncology

## 2011-01-16 ENCOUNTER — Other Ambulatory Visit: Payer: Self-pay | Admitting: Oncology

## 2011-01-16 ENCOUNTER — Encounter (HOSPITAL_BASED_OUTPATIENT_CLINIC_OR_DEPARTMENT_OTHER): Payer: Self-pay | Admitting: Oncology

## 2011-01-16 DIAGNOSIS — C50919 Malignant neoplasm of unspecified site of unspecified female breast: Secondary | ICD-10-CM

## 2011-01-16 DIAGNOSIS — Z170 Estrogen receptor positive status [ER+]: Secondary | ICD-10-CM

## 2011-01-16 DIAGNOSIS — I4891 Unspecified atrial fibrillation: Secondary | ICD-10-CM | POA: Insufficient documentation

## 2011-01-16 DIAGNOSIS — R918 Other nonspecific abnormal finding of lung field: Secondary | ICD-10-CM | POA: Insufficient documentation

## 2011-01-16 DIAGNOSIS — I10 Essential (primary) hypertension: Secondary | ICD-10-CM | POA: Insufficient documentation

## 2011-01-16 DIAGNOSIS — C50219 Malignant neoplasm of upper-inner quadrant of unspecified female breast: Secondary | ICD-10-CM

## 2011-01-16 DIAGNOSIS — Z79899 Other long term (current) drug therapy: Secondary | ICD-10-CM | POA: Insufficient documentation

## 2011-01-16 LAB — COMPREHENSIVE METABOLIC PANEL
ALT: 46 U/L — ABNORMAL HIGH (ref 0–35)
AST: 44 U/L — ABNORMAL HIGH (ref 0–37)
Albumin: 3.8 g/dL (ref 3.5–5.2)
BUN: 13 mg/dL (ref 6–23)
CO2: 31 mEq/L (ref 19–32)
Calcium: 9.6 mg/dL (ref 8.4–10.5)
Chloride: 103 mEq/L (ref 96–112)
Creatinine, Ser: 0.75 mg/dL (ref 0.40–1.20)
Potassium: 4.1 mEq/L (ref 3.5–5.3)

## 2011-01-16 LAB — CBC WITH DIFFERENTIAL/PLATELET
Basophils Absolute: 0 10*3/uL (ref 0.0–0.1)
HCT: 40.2 % (ref 34.8–46.6)
HGB: 14.1 g/dL (ref 11.6–15.9)
MONO#: 0.5 10*3/uL (ref 0.1–0.9)
NEUT#: 3.6 10*3/uL (ref 1.5–6.5)
NEUT%: 66.8 % (ref 38.4–76.8)
RDW: 12.6 % (ref 11.2–14.5)
WBC: 5.4 10*3/uL (ref 3.9–10.3)
lymph#: 1 10*3/uL (ref 0.9–3.3)

## 2011-01-17 LAB — CANCER ANTIGEN 27.29: CA 27.29: 18 U/mL (ref 0–39)

## 2011-01-29 ENCOUNTER — Encounter (HOSPITAL_BASED_OUTPATIENT_CLINIC_OR_DEPARTMENT_OTHER): Payer: Self-pay | Admitting: Oncology

## 2011-01-29 DIAGNOSIS — Z170 Estrogen receptor positive status [ER+]: Secondary | ICD-10-CM

## 2011-01-29 DIAGNOSIS — C773 Secondary and unspecified malignant neoplasm of axilla and upper limb lymph nodes: Secondary | ICD-10-CM

## 2011-01-29 DIAGNOSIS — I4891 Unspecified atrial fibrillation: Secondary | ICD-10-CM

## 2011-01-29 DIAGNOSIS — C50219 Malignant neoplasm of upper-inner quadrant of unspecified female breast: Secondary | ICD-10-CM

## 2011-02-09 ENCOUNTER — Institutional Professional Consult (permissible substitution): Payer: Self-pay | Admitting: Cardiology

## 2011-07-24 ENCOUNTER — Telehealth: Payer: Self-pay | Admitting: Oncology

## 2011-07-25 NOTE — Telephone Encounter (Signed)
Patient has moved to [place], [place] and wants a referral to Fredderick Erb, MD @ [facility]; [address] [place], [place] [address] Phone [phone] She didn't know the fax number.

## 2011-09-27 ENCOUNTER — Telehealth: Payer: Self-pay | Admitting: Oncology

## 2011-09-27 ENCOUNTER — Other Ambulatory Visit: Payer: Self-pay | Admitting: Oncology

## 2011-09-27 DIAGNOSIS — Z853 Personal history of malignant neoplasm of breast: Secondary | ICD-10-CM

## 2011-09-27 NOTE — Telephone Encounter (Signed)
Pt called to r/s her appts that she missed in sept 2012

## 2011-10-18 ENCOUNTER — Other Ambulatory Visit: Payer: Self-pay | Admitting: Oncology

## 2011-10-19 ENCOUNTER — Encounter: Payer: Self-pay | Admitting: *Deleted

## 2011-10-19 ENCOUNTER — Other Ambulatory Visit: Payer: Self-pay | Admitting: Lab

## 2011-10-19 ENCOUNTER — Ambulatory Visit (HOSPITAL_BASED_OUTPATIENT_CLINIC_OR_DEPARTMENT_OTHER): Payer: Self-pay | Admitting: Oncology

## 2011-10-19 ENCOUNTER — Ambulatory Visit
Admission: RE | Admit: 2011-10-19 | Discharge: 2011-10-19 | Disposition: A | Discharge status: Home / Self Care | Payer: Self-pay | Source: Ambulatory Visit | Attending: Oncology | Admitting: Oncology

## 2011-10-19 VITALS — BP 130/78 | HR 64 | Temp 98.1°F | Ht 64.0 in | Wt 208.3 lb

## 2011-10-19 DIAGNOSIS — C50919 Malignant neoplasm of unspecified site of unspecified female breast: Secondary | ICD-10-CM | POA: Insufficient documentation

## 2011-10-19 DIAGNOSIS — Z853 Personal history of malignant neoplasm of breast: Secondary | ICD-10-CM

## 2011-10-19 DIAGNOSIS — Z170 Estrogen receptor positive status [ER+]: Secondary | ICD-10-CM

## 2011-10-19 DIAGNOSIS — Z79811 Long term (current) use of aromatase inhibitors: Secondary | ICD-10-CM

## 2011-10-19 LAB — CBC WITH DIFFERENTIAL/PLATELET
BASO%: 0.5 % (ref 0.0–2.0)
Eosinophils Absolute: 0.2 10*3/uL (ref 0.0–0.5)
LYMPH%: 15.1 % (ref 14.0–49.7)
MCHC: 35.1 g/dL (ref 31.5–36.0)
MCV: 97.5 fL (ref 79.5–101.0)
MONO%: 9.2 % (ref 0.0–14.0)
NEUT#: 3.7 10*3/uL (ref 1.5–6.5)
Platelets: 169 10*3/uL (ref 145–400)
RBC: 4.39 10*6/uL (ref 3.70–5.45)
RDW: 12.2 % (ref 11.2–14.5)
WBC: 5.1 10*3/uL (ref 3.9–10.3)

## 2011-10-19 LAB — COMPREHENSIVE METABOLIC PANEL
ALT: 45 U/L — ABNORMAL HIGH (ref 0–35)
AST: 45 U/L — ABNORMAL HIGH (ref 0–37)
Albumin: 4.2 g/dL (ref 3.5–5.2)
Alkaline Phosphatase: 134 U/L — ABNORMAL HIGH (ref 39–117)
Potassium: 4.2 mEq/L (ref 3.5–5.3)
Sodium: 140 mEq/L (ref 135–145)
Total Bilirubin: 0.6 mg/dL (ref 0.3–1.2)
Total Protein: 6.6 g/dL (ref 6.0–8.3)

## 2011-10-19 LAB — CANCER ANTIGEN 27.29: CA 27.29: 24 U/mL (ref 0–39)

## 2011-10-19 MED ORDER — LETROZOLE 2.5 MG PO TABS: 2.5000 mg | ORAL_TABLET | Freq: Every day | ORAL | Status: DC

## 2011-10-19 NOTE — Progress Notes (Signed)
ID: Jodene Nam  DOB: [birth year]  MR#: [id number]  CSN#: [id number]   Interval History:   Avanni returns today for follow-up of her breast cancer. Since the last visit here she and her husband Ron moved to [facility]. Unfortunately he suffered a stroke while driving and had a terrible AA, was in intensive care several weeks, and is only now able to live at home, still requiring 24/7 support (dressing, wiping after soiling, etc.). 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  ROS:  She is tolerating the letrozole well, with no significant hot flashes, no arthralgias/myalgias, and no worsening vaginal dryness problems. Occasionally she has pain in the Left breast, and the scar tissue there feels tight, though she has a fair ROM. Otherwise a detailed review of systems was negative.  Allergies  Allergen Reactions  . Benzonatate     REACTION: Swelling  . Codeine     REACTION: Nausea    Current Outpatient Prescriptions  Medication Sig Dispense Refill  . aspirin 325 MG EC tablet Take 325 mg by mouth daily.      Marland Kitchen atenolol (TENORMIN) 25 MG tablet Take 25 mg by mouth daily.      . calcium-vitamin D (OSCAL WITH D) 500-200 MG-UNIT per tablet Take 1 tablet by mouth daily.      . cholecalciferol (VITAMIN D) 1000 UNITS tablet Take 1,000 Units by mouth daily.      Marland Kitchen letrozole (FEMARA) 2.5 MG tablet Take 2.5 mg by mouth daily.      Marland Kitchen levothyroxine (SYNTHROID, LEVOTHROID) 125 MCG tablet Take 125 mcg by mouth daily.      Marland Kitchen losartan (COZAAR) 50 MG tablet Take 50 mg by mouth daily.      . rosuvastatin (CRESTOR) 20 MG tablet Take 20 mg by mouth daily.      Marland Kitchen triamterene-hydrochlorothiazide (MAXZIDE-25) 37.5-25 MG per tablet Take 1 tablet by mouth daily.         Objective: middle-aged White woman in no acute distress  Filed Vitals:   10/19/11 1129    BP: 130/78  Pulse: 64  Temp: 98.1 F (36.7 C)    BMI: Body mass index is 35.75 kg/(m^2).   ECOG FS: 0 Physical Exam:   Sclerae unicteric  Oropharynx clear  No peripheral adenopathy  Lungs clear -- no rales or rhonchi  Heart regular rate and rhythm  Abdomen benign  MSK no focal spinal tenderness, no peripheral edema  Neuro nonfocal  Breast exam: Right breast no suspicious findings. Left breast status post lumpectomy and radiation. No evidence of local recurrence. She has bilateral subglandular implants. There is mild capsule formation on the left  Lab Results:      Chemistry      Component Value Date/Time   NA 141 01/16/2011 1550   K 4.1 01/16/2011 1550   CL 103 01/16/2011 1550   CO2 31 01/16/2011 1550   BUN 13 01/16/2011 1550   CREATININE 0.75 01/16/2011 1550      Component Value Date/Time   CALCIUM 9.6 01/16/2011 1550   ALKPHOS 132* 01/16/2011 1550   AST 44* 01/16/2011 1550   ALT 46* 01/16/2011 1550   BILITOT 0.4 01/16/2011 1550       Lab Results  Component Value Date   WBC 5.1 10/19/2011   HGB 15.0 10/19/2011   HCT 42.8  10/19/2011   MCV 97.5 10/19/2011   PLT 169 10/19/2011   NEUTROABS 3.7 10/19/2011    Studies/Results:  Mm Digital Diagnostic Bilat  10/19/2011  *RADIOLOGY REPORT*  Clinical Data:  History of malignant left breast lumpectomy August, 2011, pathology revealing invasive lobular carcinoma with metastatic disease in 14 of 15 left axillary lymph nodes. Chemotherapy and radiation therapy.  Bilateral subpectoral implants in 1987.  Annual reevaluation, right breast.  DIGITAL DIAGNOSTIC BILATERAL MAMMOGRAM WITH CAD  Comparison:  11/01/2010, 04/13/2010, 10/31/2009 and dating back to 10/30/2007.  Findings:  Implant and standard CC and MLO views of both breasts and a spot tangential view of the left breast at the lumpectomy site were obtained.  Scattered fibroglandular parenchymal pattern. Post-surgical scarring in the lateral left breast at the site of lumpectomy.  Dystrophic calcification  involving the capsule of the left os subpectoral implant. No new suspicious mass, nonsurgical architectural distortion, or suspicious calcifications in either breast. Mammographic images were processed with CAD.  IMPRESSION:  1.  No mammographic evidence of malignancy. 2.  Post-surgical scarring at the lumpectomy site in the lateral left breast.  Recommendation:  Annual bilateral diagnostic mammography in 1 year, February, 2014.  BI-RADS CATEGORY 2:  Benign finding(s).  Original Report Authenticated By: Arnell Sieving, M.D.    Assessment: A 66 year old [place] woman,   (1) status post left breast biopsy in February of 2011 for an invasive lobular carcinoma measuring 2.9 cm by MRI, with a positive prechemotherapy axillary lymph node biopsy, strongly estrogen and progesterone receptor positive with no evidence of HER-2/neu amplification and an MIB-1 of 14%.   (2) received 6 cycles of docetaxel/cyclophosphamide in the neoadjuvant setting, completed in mid July of 2011  (3) s/p left lumpectomy and axillary lymph node dissection in August of 2011 for a ypT1b yTN3 (14 positive lymph nodes), Stage IIIC, grade 1 residual tumor.  HER-2/neu was repeated, again not amplified.   (4) Completed loco-regional radiation in late October of 2011  (5) on letrozole starting early November 2011, with good tolerance   Plan: She would like to establish yourself with an oncologist closer to home, and we are referring her to Kyla Balzarine, who used to be our partner here. Tentatively she has an appointment with him on February 18. The overall plan is still to continue letrozole and minimum of 5 years, and I would have very little objection to either switching her to tamoxifen or continuing letrozole an additional 5 years. We will be glad to see her at any point on an as needed basis but as of now no further appointments have been made for her here.    Lynx Goodrich C 10/19/2011

## 2011-10-19 NOTE — Progress Notes (Unsigned)
Per MD, request for medical records to fax all records to Dr Milton Ferguson. [phone] and fax [phone].

## 2012-09-15 ENCOUNTER — Other Ambulatory Visit: Payer: Self-pay | Admitting: Oncology

## 2012-09-15 DIAGNOSIS — Z853 Personal history of malignant neoplasm of breast: Secondary | ICD-10-CM

## 2012-10-10 ENCOUNTER — Other Ambulatory Visit: Payer: Self-pay | Admitting: *Deleted

## 2012-10-14 ENCOUNTER — Telehealth: Payer: Self-pay | Admitting: Oncology

## 2012-10-14 NOTE — Telephone Encounter (Signed)
Pt called for f/u w/GM. Per Val pt can be see 2/24 @ 4:30pm. Val will send pt script for lbs. S/w pt today re appt for 2/24 @ 4:30pm. Added phone number for home [phone].

## 2012-11-03 ENCOUNTER — Ambulatory Visit (HOSPITAL_BASED_OUTPATIENT_CLINIC_OR_DEPARTMENT_OTHER): Payer: No Typology Code available for payment source | Admitting: Oncology

## 2012-11-03 ENCOUNTER — Ambulatory Visit
Admission: RE | Admit: 2012-11-03 | Discharge: 2012-11-03 | Disposition: A | Discharge status: Home / Self Care | Payer: No Typology Code available for payment source | Source: Ambulatory Visit | Attending: Oncology | Admitting: Oncology

## 2012-11-03 VITALS — BP 114/74 | HR 67 | Temp 97.7°F | Resp 20 | Ht 64.0 in | Wt 223.0 lb

## 2012-11-03 DIAGNOSIS — C773 Secondary and unspecified malignant neoplasm of axilla and upper limb lymph nodes: Secondary | ICD-10-CM

## 2012-11-03 DIAGNOSIS — C50919 Malignant neoplasm of unspecified site of unspecified female breast: Secondary | ICD-10-CM

## 2012-11-03 DIAGNOSIS — Z853 Personal history of malignant neoplasm of breast: Secondary | ICD-10-CM

## 2012-11-03 DIAGNOSIS — Z170 Estrogen receptor positive status [ER+]: Secondary | ICD-10-CM

## 2012-11-03 MED ORDER — LETROZOLE 2.5 MG PO TABS: 2.5000 mg | ORAL_TABLET | Freq: Every day | ORAL | Status: DC

## 2012-11-03 NOTE — Progress Notes (Signed)
ID: Maria Pruitt   DOB: 08/11/1946  MR#: [id number]  DGU#:[id number]  PCP: Maria Pruitt GYN:  SU:  OTHER MD:   HISTORY OF PRESENT ILLNESS: Maria Pruitt herself palpated a change in her left breast, shortly before her mammogram was due.  She brought this to Dr. Jerelene Redden attention and he set her up for diagnostic mammography on October 31, 2009.   Routine and implant displaced views of both breasts showed a 2 cm irregular mass in the outer left breast, seen only on the spot tangential view.  The breast tissue itself was fatty.  There were no suspicious calcifications.  This mass was palpable to Dr. Si Gaul.  An ultrasound showed it to be 1.7 cm irregular and hypoechoic.  There were at least two enlarged left axillary lymph nodes identified.  Dr. Si Gaul recommended biopsy which was performed the same day and showed ([id number]) and invasive breast cancer felt most likely to be ductal with lobular features.  The left axillary lymph node biopsy also showed the same tumor (similar morphologic findings) and both prognostic profiles showed the cancer to be strongly ER and PR positive with a low to borderline proliferation fraction (13 and 14%).  Neither mass showed amplification of Her-2 by CISH.  (The ratios were 1.1 and 0.85.)    With this information, the patient was referred to Dr. Jamey Ripa and bilateral breast MRIs were obtained March 1st.  This showed several left axillary lymph nodes with cortical thickening, the largest one measuring 1.1 cm.  The mass itself measured 2.9 cm by MRI.  It was spiculated and irregular.  There was no evidence of contralateral disease.   Her subsequent history is as detailed below  INTERVAL HISTORY: Maria Pruitt returns today for followup of her breast cancer. She has moved to [facility], and establish herself there would Dr. Sabino Gasser, however he has left the area. Accordingly she is returning here for yearly followups.  REVIEW OF SYSTEMS: She tells me she has been  diagnosed with atrial fibrillation, but that is now well controlled on an unusual double beta blocker protocol. She has intermittent aches and pains, which are not more intense or frequent or extensive than before. She has some scalp keratoses and of course still has her hypothyroid problems. The biggest issue in her life is her husband's stroke leading to a severe automobile accident. He was in a coma for many weeks. He has made a miraculous recovery and of course she is still helping him with his rehabilitation and is and is "working as his chauffeur" at present. A detailed review of systems today was otherwise noncontributory and in particular she is tolerating the letrozole with no side effects that she is aware of  PAST MEDICAL HISTORY: No past medical history on file. 1. Significant for pituitary adenoma which was removed in 1983 but recurred, requiring radiation and briefly some Parlodel.  She has been off treatment since 1985.  She has some acromegaly secondary to this tumor.   2. She is status post bilateral submuscular breast implants under Dr. Shon Hough in 1987.   3. She is status post C-section for her third child who was hydrocephalic.   4. She underwent rhinoplasty in 1984.   5. She has a history of hypertension. 6. History of mitral valve prolapse, but she says she has not been receiving antibiotics prior to dental or similar procedures.   7. History of hypothyroidism.   8. History of congenital absence of one kidney.   9. History of hyperlipidemia.  10. History of obesity.   11. History of renal stones.   12. History of fatty liver.   13. History of gout.   14. History of palpitations.   History of childhood asthma  PAST SURGICAL HISTORY: No past surgical history on file.  FAMILY HISTORY No family history on file. The patient's father died from a myocardial infarction at the age of 73.  The patient's mother died from complications of congestive heart failure at the age of 85.   The patient had one brother who died from a myocardial infarction at the age of 3.  One of the brothers and two sisters are alive and well.   There is no history of breast or ovarian cancer in the family to her knowledge.  GYNECOLOGIC HISTORY: She is GX P3, first pregnancy to term at age 24.  She went through the change of life at the time of her pituitary surgery in 1983.  She took hormone replacement for less than a year because of poor tolerance.    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  She gave up her job in 2010-08-07.  Her first husband died from chronic myeloid leukemia in 1985.  Her three children from that marriage include her son Maria Pruitt who died from complications of hydrocephaly at age 33; son Maria Hua who is a Occupational hygienist and son Maria Pruitt who is an Youth worker in this area.  The patient has five grandchildren.  Her second husband of 20+ years, Maria Pruitt, is a Optician, dispensing of an independent [employer] and also does Production assistant, radio and is a Secretary/administrator.  He lost his first wife to endometrial cancer.  He has a daughter from that marriage.  She lives in [facility]   ADVANCED DIRECTIVES: not in place  HEALTH MAINTENANCE: History  Substance Use Topics  . Smoking status: Not on file  . Smokeless tobacco: Not on file  . Alcohol Use: Not on file     Colonoscopy:  PAP:  Bone density: January 2012/ osteopenia  Lipid panel:  Allergies  Allergen Reactions  . Benzonatate     REACTION: Swelling  . Codeine     REACTION: Nausea    Current Outpatient Prescriptions  Medication Sig Dispense Refill  . aspirin 325 MG EC tablet Take 325 mg by mouth daily.      Marland Kitchen atenolol (TENORMIN) 25 MG tablet Take 25 mg by mouth daily.      . calcium-vitamin D (OSCAL WITH D) 500-200 MG-UNIT per tablet Take 1 tablet by mouth daily.      . cholecalciferol (VITAMIN D) 1000 UNITS tablet Take 1,000 Units by mouth daily.      Marland Kitchen letrozole (FEMARA)  2.5 MG tablet Take 1 tablet (2.5 mg total) by mouth daily.  90 tablet  4  . levothyroxine (SYNTHROID, LEVOTHROID) 125 MCG tablet Take 125 mcg by mouth daily.      Marland Kitchen losartan (COZAAR) 50 MG tablet Take 50 mg by mouth daily.      . rosuvastatin (CRESTOR) 20 MG tablet Take 20 mg by mouth daily.      Marland Kitchen triamterene-hydrochlorothiazide (MAXZIDE-25) 37.5-25 MG per tablet Take 1 tablet by mouth daily.       No current facility-administered medications for this visit.    OBJECTIVE: Middle-aged white woman in no acute distress Filed Vitals:   11/03/12 1641  BP: 114/74  Pulse: 67  Temp: 97.7 F (36.5 C)  Resp: 20  Body mass index is 38.26 kg/(m^2).    ECOG FS: 1  Sclerae unicteric Oropharynx clear No cervical or supraclavicular adenopathy Lungs no rales or rhonchi Heart regular rate and rhythm Abd benign MSK no focal spinal tenderness Neuro: nonfocal, well oriented, positive affect Breasts: The right breast is unremarkable. The left breast is status post lumpectomy and radiation. There is no evidence of local recurrence. The left axilla is benign.   LAB RESULTS: Lab Results  Component Value Date   WBC 5.1 10/19/2011   NEUTROABS 3.7 10/19/2011   HGB 15.0 10/19/2011   HCT 42.8 10/19/2011   MCV 97.5 10/19/2011   PLT 169 10/19/2011      Chemistry      Component Value Date/Time   NA 140 10/19/2011 1111   K 4.2 10/19/2011 1111   CL 103 10/19/2011 1111   CO2 27 10/19/2011 1111   BUN 15 10/19/2011 1111   CREATININE 0.78 10/19/2011 1111      Component Value Date/Time   CALCIUM 9.5 10/19/2011 1111   ALKPHOS 134* 10/19/2011 1111   AST 45* 10/19/2011 1111   ALT 45* 10/19/2011 1111   BILITOT 0.6 10/19/2011 1111       Lab Results  Component Value Date   LABCA2 24 10/19/2011    No components found with this basename: [id number]    No results found for this basename: INR,  in the last 168 hours  Urinalysis    Component Value Date/Time   COLORURINE lt. yellow 05/15/2010 1025   APPEARANCEUR Clear 05/15/2010  1025   LABSPEC <1.005 05/15/2010 1025   LABSPEC 1.005 04/28/2010 1055   PHURINE 6.0 05/15/2010 1025   GLUCOSEU NEGATIVE 04/10/2010 0812   HGBUR negative 05/15/2010 1025   HGBUR NEGATIVE 04/10/2010 0812   BILIRUBINUR negative 05/15/2010 1025   KETONESUR NEGATIVE 04/10/2010 0812   PROTEINUR NEGATIVE 04/10/2010 0812   UROBILINOGEN 0.2 05/15/2010 1025   NITRITE negative 05/15/2010 1025   LEUKOCYTESUR NEGATIVE MICROSCOPIC NOT DONE ON URINES WITH NEGATIVE PROTEIN, BLOOD, LEUKOCYTES, NITRITE, OR GLUCOSE <1000 mg/dL. 04/10/2010 9562    STUDIES: Mm Digital Diagnostic Bilat  11/03/2012  *RADIOLOGY REPORT*  Clinical Data:  History of left breast cancer status post lumpectomy 2011  DIGITAL DIAGNOSTIC BILATERAL MAMMOGRAM WITH CAD  Comparison: With priors  Findings:  ACR Breast Density Category 2: There is a scattered fibroglandular pattern.  Standard and push back views were obtained.  There are bilateral subpectoral implants.  Stable lumpectomy changes are seen in the left breast.  There is no new suspicious mass or malignant-type microcalcifications in either breast.  Mammographic images were processed with CAD.  IMPRESSION: No evidence of malignancy in either breast.  RECOMMENDATION: Diagnostic mammogram in 1 year is recommended.  I have discussed the findings and recommendations with the patient. Results were also provided in writing at the conclusion of the visit.  BI-RADS CATEGORY 2:  Benign finding(s).   Original Report Authenticated By: Baird Lyons, M.D.     ASSESSMENT: 67 y.o. [place], [place] woman  (1) status post left breast biopsy in February of 2011 for an invasive lobular carcinoma measuring 2.9 cm by MRI, with a positive prechemotherapy axillary lymph node biopsy, strongly estrogen and progesterone receptor positive with no evidence of HER-2/neu amplification and an MIB-1 of 14%.  (2) received 6 cycles of docetaxel/cyclophosphamide in the neoadjuvant setting, completed in mid July of 2011  (3) s/p left lumpectomy  and axillary lymph node dissection in August of 2011 for a ypT1b yTN3 (14 positive lymph  nodes), Stage IIIC, grade 1 residual tumor. HER-2/neu was repeated, again not amplified.  (4) Completed loco-regional radiation in late October of 2011  (5) on letrozole starting early November 2011, with good tolerance   PLAN: Meloney is doing quite well, with no evidence of disease recurrence now 3 years out from her original diagnosis. The plan is to continue letrozole for a total of at least 5 years, and we will review data and see if we should be extending that to 10 years out when we get there.  Since Dr. Sabino Gasser is no longer available, she would like to be followed here and since we see her only once a year and she has quite a bit of family in [place] up it would not be a hardship for her. Accordingly she will see me again in one year. We will obtain lab work and her mammography the same day. She knows to call for any problems that may develop before the next visit.   Chelly Dombeck C    11/03/2012

## 2012-11-11 ENCOUNTER — Telehealth: Payer: Self-pay | Admitting: Oncology

## 2012-11-18 ENCOUNTER — Encounter: Payer: Self-pay | Admitting: Oncology

## 2013-05-13 ENCOUNTER — Telehealth: Payer: Self-pay | Admitting: Cardiology

## 2013-05-13 NOTE — Telephone Encounter (Signed)
New Problem  Pt is a New Pt// states that she was a prev pt w/ brackbill @ [facility]// pt has had problems w/ afib.Marland Kitchen Request to speak w nurse for help before appt on 10/30 w/Jordan

## 2013-05-13 NOTE — Telephone Encounter (Signed)
Returned call to patient she stated she has appointment with Dr.Jordan 07/09/13 and would like to be seen sooner.Stated she is having atrial fib more.Stated she is a old patient of Dr.Brackbill.Appointment scheduled with Dr.Jordan tomorrow 05/14/13 at 9:15 am.[facility] Cardiology chart requested.

## 2013-05-14 ENCOUNTER — Ambulatory Visit (INDEPENDENT_AMBULATORY_CARE_PROVIDER_SITE_OTHER): Payer: Medicare Other | Admitting: Cardiology

## 2013-05-14 ENCOUNTER — Encounter: Payer: Self-pay | Admitting: Cardiology

## 2013-05-14 VITALS — BP 126/76 | HR 54 | Ht 64.0 in | Wt 219.4 lb

## 2013-05-14 DIAGNOSIS — I10 Essential (primary) hypertension: Secondary | ICD-10-CM

## 2013-05-14 DIAGNOSIS — E785 Hyperlipidemia, unspecified: Secondary | ICD-10-CM

## 2013-05-14 DIAGNOSIS — R9431 Abnormal electrocardiogram [ECG] [EKG]: Secondary | ICD-10-CM | POA: Insufficient documentation

## 2013-05-14 DIAGNOSIS — I4891 Unspecified atrial fibrillation: Secondary | ICD-10-CM | POA: Insufficient documentation

## 2013-05-14 LAB — CBC WITH DIFFERENTIAL/PLATELET
Basophils Absolute: 0 10*3/uL (ref 0.0–0.1)
Eosinophils Absolute: 0.2 10*3/uL (ref 0.0–0.7)
HCT: 45.5 % (ref 36.0–46.0)
Lymphs Abs: 1.6 10*3/uL (ref 0.7–4.0)
Monocytes Absolute: 0.6 10*3/uL (ref 0.1–1.0)
Monocytes Relative: 9.3 % (ref 3.0–12.0)
Platelets: 196 10*3/uL (ref 150.0–400.0)
RDW: 12.4 % (ref 11.5–14.6)

## 2013-05-14 LAB — BASIC METABOLIC PANEL
CO2: 32 mEq/L (ref 19–32)
Calcium: 9.5 mg/dL (ref 8.4–10.5)
Chloride: 101 mEq/L (ref 96–112)
Glucose, Bld: 113 mg/dL — ABNORMAL HIGH (ref 70–99)
Potassium: 4.5 mEq/L (ref 3.5–5.1)
Sodium: 138 mEq/L (ref 135–145)

## 2013-05-14 LAB — TSH: TSH: 0.29 u[IU]/mL — ABNORMAL LOW (ref 0.35–5.50)

## 2013-05-14 LAB — LIPID PANEL
HDL: 41.9 mg/dL (ref >39.00)
Triglycerides: 121 mg/dL (ref 0.0–149.0)
VLDL: 24.2 mg/dL (ref 0.0–40.0)

## 2013-05-14 LAB — HEPATIC FUNCTION PANEL
Albumin: 4 g/dL (ref 3.5–5.2)
Bilirubin, Direct: 0.1 mg/dL (ref 0.0–0.3)
Total Bilirubin: 1.3 mg/dL — ABNORMAL HIGH (ref 0.3–1.2)

## 2013-05-14 NOTE — Patient Instructions (Signed)
We will check lab work today  We will schedule you for an Echocardiogram and Nuclear stress test.  Continue your current therapy.

## 2013-05-14 NOTE — Progress Notes (Signed)
Maria Pruitt Date of Birth: 1946-05-30 Medical Record #[id number]  History of Present Illness: Maria Pruitt is self-referred for evaluation of atrial fibrillation. She is a pleasant 67 year old white female who reports that she has had intermittent atrial fibrillation for years. She was told in the past that she probably has mitral valve prolapse. She has been on long-term therapy with beta blockers and Coumadin. Generally, her atrial fibrillation only occurs 3-4 times per year and is self terminated. She did go to the emergency room here in 2012 with a sustained episode of atrial fibrillation. This converted with IV procainamide. This past Tuesday she had been exercising when she developed recurrent tachycardia like her atrial fibrillation. She took an extra beta blocker and after several hours her arrhythmia subsided. She does note that when her arrhythmia last longer she does get short of breath and has some chest pain. She has cardiac risk factors of hypertension, hyperlipidemia, and very strong family history of early coronary disease.  Current Outpatient Prescriptions on File Prior to Visit  Medication Sig Dispense Refill  . atenolol (TENORMIN) 25 MG tablet Take 25 mg by mouth daily.      Marland Kitchen letrozole (FEMARA) 2.5 MG tablet Take 1 tablet (2.5 mg total) by mouth daily.  90 tablet  4  . levothyroxine (SYNTHROID, LEVOTHROID) 125 MCG tablet Take 112 mcg by mouth daily.       Marland Kitchen losartan (COZAAR) 50 MG tablet Take 50 mg by mouth daily.      Marland Kitchen triamterene-hydrochlorothiazide (MAXZIDE-25) 37.5-25 MG per tablet Take 1 tablet by mouth daily.       No current facility-administered medications on file prior to visit.    Allergies  Allergen Reactions  . Benzonatate     REACTION: Swelling  . Codeine     REACTION: Nausea    Past Medical History  Diagnosis Date  . Hypertension   . Breast cancer 2011    Stage 3  . Atrial fibrillation   . Hypercholesterolemia   . Abnormal ECG      Past Surgical History  Procedure Laterality Date  . Cesarean section  1980  . Breast enhancement surgery  1987  . Umbilical hernia repair    . Tubal ligation    . Scar revision    . Pituitary surgery      adenoma  . Rhinoplasty  1985    History  Smoking status  . Never Smoker   Smokeless tobacco  . Not on file    History  Alcohol Use No    Family History  Problem Relation Age of Onset  . Heart disease Mother   . Heart attack Father   . Heart attack Brother     Review of Systems: The review of systems is positive for elevated transaminases.  All other systems were reviewed and are negative.  Physical Exam: BP 126/76  Pulse 54  Ht 5\' 4"  (1.626 m)  Wt 219 lb 6.4 oz (99.519 kg)  BMI 37.64 kg/m2 She is a pleasant, overweight white female in no acute distress. HEENT: Normocephalic, atraumatic. Pupils are equal round and reactive to light and accommodation. Extraocular movements are full. Oropharynx is clear. Neck is supple no JVD, adenopathy, thyromegaly, or bruits. Lungs: Clear Cardiovascular: Regular rate and rhythm. Normal S1 and S2. No gallop, murmur, or click. Abdomen: Soft and nontender. No masses or bruits. No paraspinal medically. Extremities: Femoral and pedal pulses are 2+. She has no edema. Skin: Warm and dry Neuro: Alert and oriented x3  cranial nerves II through XII are intact.   LABORATORY DATA: ECG today demonstrates normal sinus rhythm with a rate of 56 beats per minute. There is left axis deviation. There is T wave abnormality consistent with anterolateral ischemia.  Assessment / Plan: 1. Paroxysmal atrial fibrillation. Fortunately, her episodes are fairly infrequent. I agree with continued strategy of rate control and anticoagulation. We discussed the fact that she is taking 2 beta blockers now. I recommended stopping the atenolol. If need be we could increase the metoprolol further. I recommended fasting lab work including chemistries, lipid  panel, CBC, TSH. We will also schedule her for an echocardiogram. 2. Abnormal ECG. Findings are consistent with anterolateral ischemia. She has multiple cardiac risk factors and does experience some chest pain when she is in atrial fibrillation. We will schedule her for a stress Myoview study. 3. Hypertension-controlled 4. Hyperlipidemia. She reports that she developed elevated liver function tests on Zocor. She has been on Crestor but states she quit taking it because of cost. We will assess her fasting lab work today.

## 2013-05-15 ENCOUNTER — Other Ambulatory Visit: Payer: Self-pay

## 2013-05-15 MED ORDER — LEVOTHYROXINE SODIUM 100 MCG PO TABS: 100.0000 ug | ORAL_TABLET | Freq: Every day | ORAL | Status: DC

## 2013-05-26 ENCOUNTER — Telehealth (HOSPITAL_COMMUNITY): Payer: Self-pay

## 2013-05-26 NOTE — Telephone Encounter (Addendum)
TC to Dr. Swaziland regarding patient,Maria Pruitt (DOB [birth year]),that is coming for an Exercise Cardiolite on Thursday, 05-28-13. She has history of PAF with recent episode of tachycardia while exercising that  lasted several hrs after took 2nd beta blocker. She has been on atenolol and Lopressor which you mentioned in your note recently to stop the atenolol.Routinely we hold the beta blocker 24 hrs prior test. My question is do we hold the lopressor 24 hrs prior to test? Leave on Lopressor per Dr. Swaziland for test. Irean Hong, RN

## 2013-05-28 ENCOUNTER — Ambulatory Visit (HOSPITAL_COMMUNITY): Payer: Medicare Other | Attending: Cardiology | Admitting: Radiology

## 2013-05-28 VITALS — BP 122/86 | HR 61 | Ht 64.0 in | Wt 218.0 lb

## 2013-05-28 DIAGNOSIS — R079 Chest pain, unspecified: Secondary | ICD-10-CM

## 2013-05-28 DIAGNOSIS — E785 Hyperlipidemia, unspecified: Secondary | ICD-10-CM | POA: Insufficient documentation

## 2013-05-28 DIAGNOSIS — R0602 Shortness of breath: Secondary | ICD-10-CM | POA: Insufficient documentation

## 2013-05-28 DIAGNOSIS — R002 Palpitations: Secondary | ICD-10-CM | POA: Insufficient documentation

## 2013-05-28 DIAGNOSIS — Z8249 Family history of ischemic heart disease and other diseases of the circulatory system: Secondary | ICD-10-CM | POA: Insufficient documentation

## 2013-05-28 DIAGNOSIS — I10 Essential (primary) hypertension: Secondary | ICD-10-CM | POA: Insufficient documentation

## 2013-05-28 DIAGNOSIS — R000 Tachycardia, unspecified: Secondary | ICD-10-CM | POA: Insufficient documentation

## 2013-05-28 DIAGNOSIS — R0789 Other chest pain: Secondary | ICD-10-CM | POA: Insufficient documentation

## 2013-05-28 DIAGNOSIS — R5381 Other malaise: Secondary | ICD-10-CM | POA: Insufficient documentation

## 2013-05-28 DIAGNOSIS — R9431 Abnormal electrocardiogram [ECG] [EKG]: Secondary | ICD-10-CM | POA: Insufficient documentation

## 2013-05-28 DIAGNOSIS — I4891 Unspecified atrial fibrillation: Secondary | ICD-10-CM

## 2013-05-28 MED ORDER — REGADENOSON 0.4 MG/5ML IV SOLN
0.4000 mg | Freq: Once | INTRAVENOUS | Status: AC
  Administered 2013-05-28: 0.4 mg via INTRAVENOUS

## 2013-05-28 MED ORDER — TECHNETIUM TC 99M SESTAMIBI GENERIC - CARDIOLITE
10.0000 | Freq: Once | INTRAVENOUS | Status: AC | PRN
  Administered 2013-05-28: 10 via INTRAVENOUS

## 2013-05-28 MED ORDER — TECHNETIUM TC 99M SESTAMIBI GENERIC - CARDIOLITE
30.0000 | Freq: Once | INTRAVENOUS | Status: AC | PRN
  Administered 2013-05-28: 30 via INTRAVENOUS

## 2013-05-28 NOTE — Progress Notes (Signed)
[facility] SITE 3 NUCLEAR MED [address] [place], [place] [address] [phone]    Cardiology Nuclear Med Study  Maria Pruitt is a 67 y.o. female     MRN : [id number]     DOB: [birth year]  Procedure Date: 05/28/2013  Nuclear Med Background Indication for Stress Test:  Evaluation for Ischemia and Abnormal EKG History:  ~15 yrs ago OZH:[id number] per pt.; h/o Chemo Cardiac Risk Factors: Family History - CAD, Hypertension and Lipids  Symptoms:  Chest Pain (last episode of chest discomfort was about 3-weeks ago), Fatigue, Palpitations, Rapid HR and SOB with atrial fib.   Nuclear Pre-Procedure Caffeine/Decaff Intake:  None NPO After: 9:30pm   Lungs:  Clear. O2 Sat: 94% on room air. IV 0.9% NS with Angio Cath:  22g  IV Site: R Hand  IV Started by:  Cathlyn Parsons, RN  Chest Size (in):  36 Cup Size: C  Height: 5\' 4"  (1.626 m)  Weight:  218 lb (98.884 kg)  BMI:  Body mass index is 37.4 kg/(m^2). Tech Comments:  Lopressor not held per MD    Nuclear Med Study 1 or 2 day study: 1 day  Stress Test Type:  Treadmill/Lexiscan  Reading MD: Wyvonna Plum  Order Authorizing Provider:  Vonna Drafts  Resting Radionuclide: Technetium 18m Sestamibi  Resting Radionuclide Dose: 11.0 mCi   Stress Radionuclide:  Technetium 56m Sestamibi  Stress Radionuclide Dose: 32.8 mCi           Stress Protocol Rest HR: 61 Stress HR: 116  Rest BP: 122/86 Stress BP: 204/89  Exercise Time (min): 8:00 METS: 7.0   Predicted Max HR: 155 bpm % Max HR: 74.84 bpm Rate Pressure Product: 46962   Dose of Adenosine (mg):  n/a Dose of Lexiscan: 0.4 mg  Dose of Atropine (mg): n/a Dose of Dobutamine: n/a mcg/kg/min (at max HR)  Stress Test Technologist: Smiley Houseman, CMA-N  Nuclear Technologist:  Domenic Polite, CNMT     Rest Procedure:  Myocardial perfusion imaging was performed at rest 45 minutes following the intravenous administration of Technetium 16m Sestamibi.  Rest ECG: A fib,  neg T waves in V1-4  Stress Procedure: The patient initially walked on the treadmill for six minutes utilizing the Bruce protocol, but was unable to obtain his target heart rate.   He was then given IV Lexiscan 0.4 mg over 15-seconds with concurrent low level exercise and then Technetium 39m Sestamibi was injected at 30-seconds while the patient continued walking one more minute.  There were occasional PVC's with rare couplets and occasional PAC's noted.  Quantitative spect images were obtained after a 45-minute delay.  Stress ECG: No significant change from baseline ECG  QPS Raw Data Images:  Gated studies not performed. Stress Images:  There is a perfusion defect in the basal and mid anterolateral walls.  Rest Images:  There is a perfusion defect in the basal and mid anterolateral walls.  Subtraction (SDS):  No evidence of ischemia. Transient Ischemic Dilatation (Normal <1.22):  n/a Lung/Heart Ratio (Normal <0.45):  n/a  Quantitative Gated Spect Images QGS EDV:  n/a ml QGS ESV:  n/a ml  Impression Exercise Capacity:  Good exercise capacity. BP Response:  Hypertensive blood pressure response. Clinical Symptoms:  There is dyspnea. ECG Impression:  No significant ST segment change suggestive of ischemia. Comparison with Prior Nuclear Study: No images to compare  Overall Impression:  Low risk stress nuclear study with  a fixed perfusion defect in the mid and basal anterolateral  walls consistent wiith a prior infarct in the diagonal/OM1 territory or an attenuation artifact caused by a breast implast. Because gated images were nor performed we are unable to provide information about wall motion in the area that would help [place] distinguish between prior inferct vs artifact. .  LV Ejection Fraction: Study not gated.  LV Wall Motion:  Gated images not perormed.   [name], [name] 05/28/2013

## 2013-06-01 ENCOUNTER — Telehealth: Payer: Self-pay | Admitting: Cardiology

## 2013-06-01 NOTE — Telephone Encounter (Signed)
Pt rtn call to cheryl

## 2013-06-01 NOTE — Telephone Encounter (Signed)
Returned call to patient myoview results given. 

## 2013-06-25 ENCOUNTER — Ambulatory Visit (HOSPITAL_COMMUNITY): Payer: Medicare Other | Attending: Cardiology | Admitting: Radiology

## 2013-06-25 ENCOUNTER — Encounter (INDEPENDENT_AMBULATORY_CARE_PROVIDER_SITE_OTHER): Payer: Self-pay

## 2013-06-25 ENCOUNTER — Other Ambulatory Visit (HOSPITAL_COMMUNITY): Payer: Self-pay | Admitting: Cardiology

## 2013-06-25 DIAGNOSIS — E785 Hyperlipidemia, unspecified: Secondary | ICD-10-CM | POA: Insufficient documentation

## 2013-06-25 DIAGNOSIS — I4891 Unspecified atrial fibrillation: Secondary | ICD-10-CM

## 2013-06-25 DIAGNOSIS — R079 Chest pain, unspecified: Secondary | ICD-10-CM | POA: Insufficient documentation

## 2013-06-25 DIAGNOSIS — R0609 Other forms of dyspnea: Secondary | ICD-10-CM | POA: Insufficient documentation

## 2013-06-25 DIAGNOSIS — R9431 Abnormal electrocardiogram [ECG] [EKG]: Secondary | ICD-10-CM

## 2013-06-25 DIAGNOSIS — C50919 Malignant neoplasm of unspecified site of unspecified female breast: Secondary | ICD-10-CM | POA: Insufficient documentation

## 2013-06-25 DIAGNOSIS — R0989 Other specified symptoms and signs involving the circulatory and respiratory systems: Secondary | ICD-10-CM | POA: Insufficient documentation

## 2013-06-25 DIAGNOSIS — I10 Essential (primary) hypertension: Secondary | ICD-10-CM | POA: Insufficient documentation

## 2013-06-25 MED ORDER — PERFLUTREN PROTEIN A MICROSPH IV SUSP
3.0000 mL | Freq: Once | INTRAVENOUS | Status: AC
  Administered 2013-06-25: 3 mL via INTRAVENOUS

## 2013-06-25 NOTE — Progress Notes (Signed)
Echocardiogram performed with [employer].  

## 2013-07-09 ENCOUNTER — Ambulatory Visit: Payer: Self-pay | Admitting: Cardiology

## 2013-09-28 ENCOUNTER — Other Ambulatory Visit: Payer: Self-pay | Admitting: Oncology

## 2013-09-28 DIAGNOSIS — Z853 Personal history of malignant neoplasm of breast: Secondary | ICD-10-CM

## 2013-09-28 DIAGNOSIS — C50919 Malignant neoplasm of unspecified site of unspecified female breast: Secondary | ICD-10-CM

## 2013-09-29 ENCOUNTER — Telehealth: Payer: Self-pay | Admitting: Oncology

## 2013-09-29 NOTE — Telephone Encounter (Signed)
, °

## 2013-11-04 ENCOUNTER — Other Ambulatory Visit: Payer: Self-pay | Admitting: Physician Assistant

## 2013-11-04 ENCOUNTER — Telehealth: Payer: Self-pay | Admitting: *Deleted

## 2013-11-04 NOTE — Progress Notes (Signed)
I have tried repeatedly today to get in touch with Anderson Malta. I have left her a message on her home answering machine (which has an identified message) to let her know I will not be in the office on 11/05/2013 due to the expected inclement weather, and we are moving her appointment from 1:15 to 145 with Dr. Owens Loffler.  I have asked her to call us with any problems.  A POF was generated to make this change.  Micah Flesher, PA-C 11/04/2013

## 2013-11-04 NOTE — Telephone Encounter (Signed)
Lm informed the pt that AGB will be out of the office tomorrow. gv appt for 11/05/13 @ 1:45pm...td

## 2013-11-05 ENCOUNTER — Ambulatory Visit: Admission: RE | Admit: 2013-11-05 | Payer: Medicare Other | Source: Ambulatory Visit

## 2013-11-05 ENCOUNTER — Other Ambulatory Visit: Payer: Self-pay | Admitting: *Deleted

## 2013-11-05 ENCOUNTER — Telehealth: Payer: Self-pay | Admitting: Oncology

## 2013-11-05 ENCOUNTER — Ambulatory Visit (HOSPITAL_BASED_OUTPATIENT_CLINIC_OR_DEPARTMENT_OTHER): Payer: Medicare Other | Admitting: Hematology and Oncology

## 2013-11-05 ENCOUNTER — Ambulatory Visit
Admission: RE | Admit: 2013-11-05 | Discharge: 2013-11-05 | Disposition: A | Discharge status: Home / Self Care | Payer: Medicare Other | Source: Ambulatory Visit | Attending: Oncology | Admitting: Oncology

## 2013-11-05 ENCOUNTER — Ambulatory Visit: Payer: Medicare Other | Admitting: Physician Assistant

## 2013-11-05 ENCOUNTER — Encounter (INDEPENDENT_AMBULATORY_CARE_PROVIDER_SITE_OTHER): Payer: Self-pay

## 2013-11-05 VITALS — BP 134/85 | HR 67 | Temp 98.2°F | Resp 18 | Ht 64.0 in | Wt 223.0 lb

## 2013-11-05 DIAGNOSIS — Z79890 Hormone replacement therapy (postmenopausal): Secondary | ICD-10-CM

## 2013-11-05 DIAGNOSIS — M949 Disorder of cartilage, unspecified: Secondary | ICD-10-CM

## 2013-11-05 DIAGNOSIS — Z170 Estrogen receptor positive status [ER+]: Secondary | ICD-10-CM

## 2013-11-05 DIAGNOSIS — C50919 Malignant neoplasm of unspecified site of unspecified female breast: Secondary | ICD-10-CM

## 2013-11-05 DIAGNOSIS — Z853 Personal history of malignant neoplasm of breast: Secondary | ICD-10-CM

## 2013-11-05 DIAGNOSIS — M899 Disorder of bone, unspecified: Secondary | ICD-10-CM

## 2013-11-05 DIAGNOSIS — C773 Secondary and unspecified malignant neoplasm of axilla and upper limb lymph nodes: Secondary | ICD-10-CM

## 2013-11-05 DIAGNOSIS — I4891 Unspecified atrial fibrillation: Secondary | ICD-10-CM

## 2013-11-05 MED ORDER — LETROZOLE 2.5 MG PO TABS: 2.5000 mg | ORAL_TABLET | Freq: Every day | ORAL | Status: DC

## 2013-11-05 NOTE — Telephone Encounter (Signed)
gv and printed aptp sched and avs for pt for Feb 2016 °

## 2013-11-05 NOTE — Telephone Encounter (Signed)
lvm for pt regarding to Feb 2016 appts....mailed pt appt sched/avs and letter

## 2013-11-05 NOTE — Progress Notes (Signed)
. [name]   DOB: 1946-08-16  MR#: [id number]  CSN#:[id number]  PCP: Everlene Other GYN:  SU:  OTHER MD:   HISTORY OF PRESENT ILLNESS: Maria Pruitt herself palpated a change in her left breast, shortly before her mammogram was due.  She brought this to [name] attention and he set her up for diagnostic mammography on October 31, 2009.   Routine and implant displaced views of both breasts showed a 2 cm irregular mass in the outer left breast, seen only on the spot tangential view.  The breast tissue itself was fatty.  There were no suspicious calcifications.  This mass was palpable to Dr. [name].  An ultrasound showed it to be 1.7 cm irregular and hypoechoic.  There were at least two enlarged left axillary lymph nodes identified.  Dr. [name] recommended biopsy which was performed the same day and showed ([id number]) and invasive breast cancer felt most likely to be ductal with lobular features.  The left axillary lymph node biopsy also showed the same tumor (similar morphologic findings) and both prognostic profiles showed the cancer to be strongly ER and PR positive with a low to borderline proliferation fraction (13 and 14%).  Neither mass showed amplification of Her-2 by CISH.  (The ratios were 1.1 and 0.85.)    With this information, the patient was referred to Dr. [name] and bilateral breast MRIs were obtained March 1st.  This showed several left axillary lymph nodes with cortical thickening, the largest one measuring 1.1 cm.  The mass itself measured 2.9 cm by MRI.  It was spiculated and irregular.  There was no evidence of contralateral disease.   Her subsequent history is as detailed below  INTERVAL HISTORY: Maria Pruitt returns today for followup of her breast cancer. She has moved to [facility], and establish herself there would Dr. Venia Minks, however he has left the area. Accordingly she is returning here for yearly followups.She reports today feeling well.She denies bone pain  or muscle aching due to Femara.She denies significant hot flushes.She denies abdominal pain,cough or bone pain.She does not take Vit D or calcium.She reports in the past hx of pituitary adenoma and at that time having elevated calcium.Her last Bone Density was in 2012.She takes regular Femara without skipping days.She is scheduled for mammogram today.She denies breast problems.  REVIEW OF SYSTEMS: She tells me she has been diagnosed with atrial fibrillation, but that is now well controlled on an unusual double beta blocker protocol.She continues on Coumadin.She has intermittent aches and pains, which are not more intense or frequent or extensive than before. She has some scalp keratoses and of course still has her hypothyroid problems.Patient has history of elevated liver enzymes seen by GI in the past and reports she was tested negative for hepatitis She denies alcohol.Caries diagnosis of fatty liver.   A detailed review of systems today was otherwise noncontributory and in particular she is tolerating the letrozole with no side effects that she is aware of  PAST MEDICAL HISTORY: Past Medical History  Diagnosis Date  . Hypertension   . Breast cancer 2011    Stage 3  . Atrial fibrillation   . Hypercholesterolemia   . Abnormal ECG    1. Significant for pituitary adenoma which was removed in 1983 but recurred, requiring radiation and briefly some Parlodel.  She has been off treatment since 1985.  She has some acromegaly secondary to this tumor.   2. She is status post bilateral submuscular breast implants under Dr. Towanda Malkin in 1987.  3. She is status post C-section for her third child who was hydrocephalic.   4. She underwent rhinoplasty in 1984.   5. She has a history of hypertension. 6. History of mitral valve prolapse, but she says she has not been receiving antibiotics prior to dental or similar procedures.   7. History of hypothyroidism.   8. History of congenital absence of one kidney.    9. History of hyperlipidemia.  10. History of obesity.   11. History of renal stones.   12. History of fatty liver.   13. History of gout.   14. History of palpitations.   History of childhood asthma  PAST SURGICAL HISTORY: Past Surgical History  Procedure Laterality Date  . Cesarean section  1980  . Breast enhancement surgery  1987  . Umbilical hernia repair    . Tubal ligation    . Scar revision    . Pituitary surgery      adenoma  . Rhinoplasty  1985    FAMILY HISTORY Family History  Problem Relation Age of Onset  . Heart disease Mother   . Heart attack Father   . Heart attack Brother    The patient's father died from a myocardial infarction at the age of 52.  The patient's mother died from complications of congestive heart failure at the age of 29.  The patient had one brother who died from a myocardial infarction at the age of 71.  One of the brothers and two sisters are alive and well.   There is no history of breast or ovarian cancer in the family to her knowledge.  GYNECOLOGIC HISTORY: She is GX P3, first pregnancy to term at age 24.  She went through the change of life at the time of her pituitary surgery in 1983.  She took hormone replacement for less than a year because of poor tolerance.    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  She gave up her job in 08-31-10.  Her first husband died from chronic myeloid leukemia in 1985.  Her three children from that marriage include her son Maria Pruitt who died from complications of hydrocephaly at age 50; son Maria Pruitt who is a Insurance underwriter and son Maria Pruitt who is an Public house manager in this area.  The patient has five grandchildren.  Her second husband of 20+ years, Ron, is a Company secretary of an independent [employer] and also does Writer and is a Oceanographer.  He lost his first wife to endometrial cancer.  He has a daughter from that marriage.  She lives in [place]: not in place  HEALTH MAINTENANCE: History  Substance Use Topics  . Smoking status: Never Smoker   . Smokeless tobacco: Not on file  . Alcohol Use: No     Colonoscopy:  PAP:  Bone density: January 2012/ osteopenia  Lipid panel:  Allergies  Allergen Reactions  . Benzonatate     REACTION: Swelling  . Codeine     REACTION: Nausea    Current Outpatient Prescriptions  Medication Sig Dispense Refill  . BIOTIN PO Take by mouth daily.      Marland Kitchen levothyroxine (SYNTHROID) 100 MCG tablet Take 1 tablet (100 mcg total) by mouth daily before breakfast.  30 tablet  6  . losartan (COZAAR) 50 MG tablet Take 50 mg by mouth daily.      . metoprolol (LOPRESSOR) 50 MG tablet Take  50 mg by mouth 2 (two) times daily.      Marland Kitchen triamterene-hydrochlorothiazide (MAXZIDE-25) 37.5-25 MG per tablet Take 1 tablet by mouth daily.      Marland Kitchen warfarin (COUMADIN) 5 MG tablet Take 5 mg by mouth as directed.      Marland Kitchen letrozole (FEMARA) 2.5 MG tablet Take 1 tablet (2.5 mg total) by mouth daily.  90 tablet  4   No current facility-administered medications for this visit.    OBJECTIVE: Middle-aged white woman in no acute distress Filed Vitals:   11/05/13 1229  BP: 134/85  Pulse: 67  Temp: 98.2 F (36.8 C)  Resp: 18     Body mass index is 38.26 kg/(m^2).    ECOG FS: 1  Sclerae unicteric Oropharynx clear No cervical or supraclavicular adenopathy Lungs no rales or rhonchi Heart regular rate and rhythm Abd benign MSK no focal spinal tenderness Neuro: nonfocal, well oriented, positive affect Breasts: The right breast is unremarkable. The left breast is status post lumpectomy and radiation. There is no evidence of local recurrence. The left axilla is benign.Well healed lumpectomy incision.Bilateral breast implants present.   LAB RESULTS: Lab Results  Component Value Date   WBC 6.7 05/14/2013   NEUTROABS 4.3 05/14/2013   HGB 15.9* 05/14/2013   HCT 45.5 05/14/2013   MCV 98.1 05/14/2013   PLT  196.0 05/14/2013      Chemistry      Component Value Date/Time   NA 138 05/14/2013 1031   K 4.5 05/14/2013 1031   CL 101 05/14/2013 1031   CO2 32 05/14/2013 1031   BUN 18 05/14/2013 1031   CREATININE 0.9 05/14/2013 1031      Component Value Date/Time   CALCIUM 9.5 05/14/2013 1031   ALKPHOS 120* 05/14/2013 1031   AST 93* 05/14/2013 1031   ALT 110* 05/14/2013 1031   BILITOT 1.3* 05/14/2013 1031     Outpatiet labs provided by patient collected 10/28/2013  WBC8.0 HCT 34.3 MCV 101 Plat 199  BS 89  Creat 0.76 Calcium 9.7 ALK phos 147 AST 52 ALT 67 TB 0.7  STUDIES: Mm Digital Diagnostic Bilat  11/03/2012  *RADIOLOGY REPORT*  Clinical Data:  History of left breast cancer status post lumpectomy 2011  DIGITAL DIAGNOSTIC BILATERAL MAMMOGRAM WITH CAD  Comparison: With priors  Findings:  ACR Breast Density Category 2: There is a scattered fibroglandular pattern.  Standard and push back views were obtained.  There are bilateral subpectoral implants.  Stable lumpectomy changes are seen in the left breast.  There is no new suspicious mass or malignant-type microcalcifications in either breast.  Mammographic images were processed with CAD.  IMPRESSION: No evidence of malignancy in either breast.  RECOMMENDATION: Diagnostic mammogram in 1 year is recommended.  I have discussed the findings and recommendations with the patient. Results were also provided in writing at the conclusion of the visit.  BI-RADS CATEGORY 2:  Benign finding(s).   Original Report Authenticated By: Lillia Mountain, M.D.     ASSESSMENT: 68 y.o. [place], [place] woman  (1) status post left breast biopsy in February of 2011 for an invasive lobular carcinoma measuring 2.9 cm by MRI, with a positive prechemotherapy axillary lymph node biopsy, strongly estrogen and progesterone receptor positive with no evidence of HER-2/neu amplification and an MIB-1 of 14%.  (2) received 6 cycles of docetaxel/cyclophosphamide in the neoadjuvant setting, completed in mid July  of 2011  (3) s/p left lumpectomy and axillary lymph node dissection in August of 2011 for a ypT1b yTN3 (  14 positive lymph nodes), Stage IIIC, grade 1 residual tumor. HER-2/neu was repeated, again not amplified.  (4) Completed loco-regional radiation in late October of 2011  (5) on letrozole starting early November 2011, with good tolerance   PLAN: Nisa is doing quite well, with no evidence of disease recurrence now 4 years out from her original diagnosis. The plan is to continue letrozole for a total of at least 5 years, and we will review data and see if we should be extending that to 10 years out . Will review mammogram scheduled for today. We were able to have a Bone density performed today as well since the patient is returning to [facility] tomorrow.I will call the patient depending on results.Bone density 09/2010 showed osteopenia. Emailed refill for Femara Patient advised to take Calcium 600 mg 2 tabl daily and Vit D3 2000 IU one daily OTC.Primary MD will draw Vit D level.Also monitor calcium level. If she continues to do well will plan to see her in 1 year.Patient to call us for any new concerns.  Since Dr. Venia Minks is no longer available, she would like to be followed here and since we see her only once a year and she has quite a bit of family in [place] up it would not be a hardship for her. Accordingly she will see me again in one year. We will obtain lab work and her mammography the same day. She knows to call for any problems that may develop before the next visit.   Amada Kingfisher  11/05/2013   Oncology/hematology

## 2013-11-07 ENCOUNTER — Encounter: Payer: Self-pay | Admitting: Oncology

## 2013-11-07 ENCOUNTER — Other Ambulatory Visit: Payer: Self-pay | Admitting: Oncology

## 2013-11-11 ENCOUNTER — Ambulatory Visit: Payer: No Typology Code available for payment source | Admitting: Physician Assistant

## 2013-11-11 ENCOUNTER — Other Ambulatory Visit: Payer: No Typology Code available for payment source | Admitting: Lab

## 2013-11-13 ENCOUNTER — Ambulatory Visit: Payer: No Typology Code available for payment source | Admitting: Physician Assistant

## 2013-11-13 ENCOUNTER — Other Ambulatory Visit: Payer: No Typology Code available for payment source

## 2013-11-13 ENCOUNTER — Other Ambulatory Visit: Payer: Self-pay | Admitting: Hematology and Oncology

## 2013-11-20 ENCOUNTER — Telehealth: Payer: Self-pay | Admitting: *Deleted

## 2013-11-20 NOTE — Telephone Encounter (Signed)
Rec'd message from patient that she understands Charlina Dwight Gwenlyn Found is out of the office today, bur request she call her next week when she has a moment. Will leave message for her return to clinic.

## 2013-11-30 ENCOUNTER — Encounter: Payer: Self-pay | Admitting: Oncology

## 2013-12-08 ENCOUNTER — Ambulatory Visit: Payer: Medicare Other | Admitting: Cardiology

## 2014-02-22 ENCOUNTER — Encounter: Payer: Self-pay | Admitting: Cardiology

## 2014-02-22 ENCOUNTER — Ambulatory Visit (INDEPENDENT_AMBULATORY_CARE_PROVIDER_SITE_OTHER): Payer: Medicare Other | Admitting: Cardiology

## 2014-02-22 VITALS — BP 129/61 | HR 58 | Ht 64.0 in | Wt 221.4 lb

## 2014-02-22 DIAGNOSIS — E785 Hyperlipidemia, unspecified: Secondary | ICD-10-CM

## 2014-02-22 DIAGNOSIS — I10 Essential (primary) hypertension: Secondary | ICD-10-CM

## 2014-02-22 DIAGNOSIS — I4891 Unspecified atrial fibrillation: Secondary | ICD-10-CM

## 2014-02-22 NOTE — Patient Instructions (Addendum)
Continue your current therapy  I will see you in one year.  Look up Wendi Snipes- cardiologist in [facility].

## 2014-02-22 NOTE — Progress Notes (Signed)
Maria Pruitt Date of Birth: 21-Jul-1946 Medical Record #[id number]  History of Present Illness: Maria Pruitt is seen for follow up of atrial fibrillation. She has had intermittent atrial fibrillation for years. She was told in the past that she probably has mitral valve prolapse. She has been on long-term therapy with beta blockers and Coumadin. Generally, her atrial fibrillation only occurs 3-4 times per year and is self terminated. She did go to the emergency room here in 2012 with a sustained episode of atrial fibrillation. This converted with IV procainamide.  She does note that when her arrhythmia last longer she does get short of breath and has some chest pain. She has cardiac risk factors of hypertension, hyperlipidemia, and very strong family history of early coronary disease. On her last visit her TSH was low at 0.29. Her thyroid dose was reduced to 88 mcg daily. Since then she has had no recurrent AFib. Feels well. Had TFTs checked by primary care recently and was told they were normal.   Current Outpatient Prescriptions on File Prior to Visit  Medication Sig Dispense Refill  . BIOTIN PO Take by mouth daily.      Marland Kitchen letrozole (FEMARA) 2.5 MG tablet Take 1 tablet (2.5 mg total) by mouth daily.  90 tablet  4  . losartan (COZAAR) 50 MG tablet Take 50 mg by mouth daily.      . metoprolol (LOPRESSOR) 50 MG tablet Take 50 mg by mouth 2 (two) times daily.      Marland Kitchen triamterene-hydrochlorothiazide (MAXZIDE-25) 37.5-25 MG per tablet Take 1 tablet by mouth daily.      Marland Kitchen warfarin (COUMADIN) 5 MG tablet Take 5 mg by mouth as directed.       No current facility-administered medications on file prior to visit.    Allergies  Allergen Reactions  . Benzonatate     REACTION: Swelling  . Codeine     REACTION: Nausea    Past Medical History  Diagnosis Date  . Hypertension   . Breast cancer 2011    Stage 3  . Atrial fibrillation   . Hypercholesterolemia   . Abnormal ECG     Past  Surgical History  Procedure Laterality Date  . Cesarean section  1980  . Breast enhancement surgery  1987  . Umbilical hernia repair    . Tubal ligation    . Scar revision    . Pituitary surgery      adenoma  . Rhinoplasty  1985    History  Smoking status  . Never Smoker   Smokeless tobacco  . Not on file    History  Alcohol Use No    Family History  Problem Relation Age of Onset  . Heart disease Mother   . Heart attack Father   . Heart attack Brother     Review of Systems: As noted in HPI. All other systems were reviewed and are negative.  Physical Exam: BP 129/61  Pulse 58  Ht 5\' 4"  (1.626 m)  Wt 221 lb 6.4 oz (100.426 kg)  BMI 37.98 kg/m2 She is a pleasant, overweight white female in no acute distress. HEENT: Normal. Neck is supple no JVD, adenopathy, thyromegaly, or bruits. Lungs: Clear Cardiovascular: Regular rate and rhythm. Normal S1 and S2. No gallop, murmur, or click. Abdomen: Soft and nontender. No masses or bruits. No paraspinal medically. Extremities: Femoral and pedal pulses are 2+. She has no edema. Skin: Warm and dry Neuro: Alert and oriented x3 cranial nerves II through  XII are intact.   LABORATORY DATA: Echo: 06/25/13:Study Conclusions  - Left ventricle: The cavity size was normal. There was mild concentric hypertrophy. Systolic function was normal. The estimated ejection fraction was in the range of 50% to 55%. Wall motion was normal; there were no regional wall motion abnormalities. Doppler parameters are consistent with abnormal left ventricular relaxation (grade 1 diastolic dysfunction). - Left atrium: The atrium was mildly dilated. - Atrial septum: No defect or patent foramen ovale was identified.  Stress Myoview: 05/14/13:Overall Impression: Low risk stress nuclear study with a fixed perfusion defect in the mid and basal anterolateral walls consistent wiith a prior infarct in the diagonal/OM1 territory or an attenuation artifact  caused by a breast implast. Because gated images were nor performed we are unable to provide information about wall motion in the area that would help [place] distinguish between prior inferct vs artifact. .  LV Ejection Fraction: Study not gated. LV Wall Motion: Gated images not perormed.  Maria Pruitt, H  05/28/2013   Assessment / Plan: 1. Paroxysmal atrial fibrillation.  I agree with continued strategy of rate control and anticoagulation. Symptoms significantly improved with reduction in thyroid dose. Echo shows mild LAE without MV prolapse.   2. Abnormal ECG. No definite ischemia by stress testing. Likely breast attenuation artifact. No symptoms.   4. Hyperlipidemia. Statins on hold due to elevated LFTs.

## 2014-10-26 ENCOUNTER — Ambulatory Visit
Admission: RE | Admit: 2014-10-26 | Discharge: 2014-10-26 | Disposition: A | Discharge status: Home / Self Care | Payer: Medicare Other | Source: Ambulatory Visit | Attending: Oncology | Admitting: Oncology

## 2014-10-26 ENCOUNTER — Other Ambulatory Visit: Payer: Self-pay | Admitting: *Deleted

## 2014-10-26 ENCOUNTER — Other Ambulatory Visit: Payer: Self-pay | Admitting: Oncology

## 2014-10-26 ENCOUNTER — Telehealth: Payer: Self-pay | Admitting: Oncology

## 2014-10-26 ENCOUNTER — Telehealth: Payer: Self-pay

## 2014-10-26 DIAGNOSIS — C50912 Malignant neoplasm of unspecified site of left female breast: Secondary | ICD-10-CM

## 2014-10-26 DIAGNOSIS — Z853 Personal history of malignant neoplasm of breast: Secondary | ICD-10-CM

## 2014-10-26 NOTE — Telephone Encounter (Signed)
Pt has found a new lump in her L breast. Same side she had cancer in before. She is requesting an order for a mammogram. She cannot get until after 2/25 without an order b/c insurance won't allow sooner.  She is requesting we notify [facility] on [facility] that she does not want the technician who did her mammogram last year. She is willing to pay the $50 if we feel a 3D mammogram would be more beneficial. She is also asking if she needs an appt w/Dr Magrinat sooner than her currently scheduled 2/25.

## 2014-10-26 NOTE — Telephone Encounter (Signed)
Please enter order for L diagnostic mammography with 3D at [facility] ASAP; visit w GM will depend on results but can schedule visit w Frutoso Chase i 2 weeks or so

## 2014-10-26 NOTE — Telephone Encounter (Signed)
, °

## 2014-11-03 ENCOUNTER — Telehealth: Payer: Self-pay | Admitting: Oncology

## 2014-11-03 NOTE — Telephone Encounter (Signed)
pt son Maria Pruitt cld to get time of appt for 2/26 wanting to know if it was f/u-adv not on appt-stated they would be here

## 2014-11-04 ENCOUNTER — Other Ambulatory Visit (HOSPITAL_BASED_OUTPATIENT_CLINIC_OR_DEPARTMENT_OTHER): Payer: Medicare Other

## 2014-11-04 ENCOUNTER — Telehealth: Payer: Self-pay | Admitting: Oncology

## 2014-11-04 ENCOUNTER — Ambulatory Visit (HOSPITAL_BASED_OUTPATIENT_CLINIC_OR_DEPARTMENT_OTHER): Payer: Medicare Other | Admitting: Oncology

## 2014-11-04 VITALS — BP 149/77 | HR 67 | Temp 98.5°F | Resp 18 | Ht 64.0 in | Wt 186.7 lb

## 2014-11-04 DIAGNOSIS — C773 Secondary and unspecified malignant neoplasm of axilla and upper limb lymph nodes: Secondary | ICD-10-CM

## 2014-11-04 DIAGNOSIS — C50412 Malignant neoplasm of upper-outer quadrant of left female breast: Secondary | ICD-10-CM | POA: Insufficient documentation

## 2014-11-04 DIAGNOSIS — C50919 Malignant neoplasm of unspecified site of unspecified female breast: Secondary | ICD-10-CM

## 2014-11-04 DIAGNOSIS — C50812 Malignant neoplasm of overlapping sites of left female breast: Secondary | ICD-10-CM

## 2014-11-04 DIAGNOSIS — Z8673 Personal history of transient ischemic attack (TIA), and cerebral infarction without residual deficits: Secondary | ICD-10-CM

## 2014-11-04 DIAGNOSIS — Z170 Estrogen receptor positive status [ER+]: Secondary | ICD-10-CM

## 2014-11-04 DIAGNOSIS — C50912 Malignant neoplasm of unspecified site of left female breast: Secondary | ICD-10-CM

## 2014-11-04 LAB — CBC WITH DIFFERENTIAL/PLATELET
BASO%: 0.7 % (ref 0.0–2.0)
Basophils Absolute: 0.1 10*3/uL (ref 0.0–0.1)
EOS%: 2.5 % (ref 0.0–7.0)
Eosinophils Absolute: 0.2 10*3/uL (ref 0.0–0.5)
HEMATOCRIT: 40.9 % (ref 34.8–46.6)
HGB: 14.8 g/dL (ref 11.6–15.9)
LYMPH#: 1.4 10*3/uL (ref 0.9–3.3)
LYMPH%: 21.5 % (ref 14.0–49.7)
MCH: 34.7 pg — ABNORMAL HIGH (ref 25.1–34.0)
MCHC: 36.2 g/dL — AB (ref 31.5–36.0)
MCV: 96 fL (ref 79.5–101.0)
MONO#: 0.7 10*3/uL (ref 0.1–0.9)
MONO%: 11.1 % (ref 0.0–14.0)
NEUT#: 4.3 10*3/uL (ref 1.5–6.5)
NEUT%: 64.2 % (ref 38.4–76.8)
Platelets: 176 10*3/uL (ref 145–400)
RBC: 4.26 10*6/uL (ref 3.70–5.45)
RDW: 12.3 % (ref 11.2–14.5)
WBC: 6.7 10*3/uL (ref 3.9–10.3)
nRBC: 0 % (ref 0–0)

## 2014-11-04 LAB — COMPREHENSIVE METABOLIC PANEL (CC13)
ALBUMIN: 3.7 g/dL (ref 3.5–5.0)
ALK PHOS: 197 U/L — AB (ref 40–150)
ALT: 50 U/L (ref 0–55)
AST: 59 U/L — ABNORMAL HIGH (ref 5–34)
Anion Gap: 11 mEq/L (ref 3–11)
BUN: 14.9 mg/dL (ref 7.0–26.0)
CALCIUM: 9.4 mg/dL (ref 8.4–10.4)
CO2: 25 mEq/L (ref 22–29)
Chloride: 101 mEq/L (ref 98–109)
Creatinine: 0.8 mg/dL (ref 0.6–1.1)
EGFR: 72 mL/min/{1.73_m2} — AB (ref >90)
Glucose: 101 mg/dl (ref 70–140)
POTASSIUM: 4.3 meq/L (ref 3.5–5.1)
Sodium: 136 mEq/L (ref 136–145)
Total Bilirubin: 0.73 mg/dL (ref 0.20–1.20)
Total Protein: 7.7 g/dL (ref 6.4–8.3)

## 2014-11-04 MED ORDER — ASPIRIN 81 MG PO CHEW: 81.0000 mg | CHEWABLE_TABLET | Freq: Every day | ORAL | Status: AC

## 2014-11-04 MED ORDER — LACOSAMIDE 100 MG PO TABS: 100.0000 mg | ORAL_TABLET | Freq: Two times a day (BID) | ORAL | Status: DC

## 2014-11-04 MED ORDER — FAMOTIDINE 20 MG PO TABS: 20.0000 mg | ORAL_TABLET | Freq: Two times a day (BID) | ORAL | Status: DC

## 2014-11-04 MED ORDER — ATORVASTATIN CALCIUM 40 MG PO TABS: 40.0000 mg | ORAL_TABLET | Freq: Every day | ORAL | Status: DC

## 2014-11-04 MED ORDER — LETROZOLE 2.5 MG PO TABS: 2.5000 mg | ORAL_TABLET | Freq: Every day | ORAL | Status: DC

## 2014-11-04 NOTE — Progress Notes (Signed)
. [name]   DOB: 02-Jun-1946  MR#: [id number]  CSN#:[id number]  PCP: Everlene Other GYN:  SU:  OTHER MD:    CHIEF COMPLAINT: Estrogen receptor positive breast cancer  CURRENT TREATMENT: letrozole  HISTORY OF PRESENT ILLNESS:  from the original intake note:  Ernisha herself palpated a change in her left breast, shortly before her mammogram was due.  She brought this to [name] attention and he set her up for diagnostic mammography on October 31, 2009.   Routine and implant displaced views of both breasts showed a 2 cm irregular mass in the outer left breast, seen only on the spot tangential view.  The breast tissue itself was fatty.  There were no suspicious calcifications.  This mass was palpable to Dr. [name].  An ultrasound showed it to be 1.7 cm irregular and hypoechoic.  There were at least two enlarged left axillary lymph nodes identified.  Dr. [name] recommended biopsy which was performed the same day and showed ([id number]) and invasive breast cancer felt most likely to be ductal with lobular features.  The left axillary lymph node biopsy also showed the same tumor (similar morphologic findings) and both prognostic profiles showed the cancer to be strongly ER and PR positive with a low to borderline proliferation fraction (13 and 14%).  Neither mass showed amplification of Her-2 by CISH.  (The ratios were 1.1 and 0.85.)    With this information, the patient was referred to Dr. [name] and bilateral breast MRIs were obtained March 1st.  This showed several left axillary lymph nodes with cortical thickening, the largest one measuring 1.1 cm.  The mass itself measured 2.9 cm by MRI.  It was spiculated and irregular.  There was no evidence of contralateral disease.   Her subsequent history is as detailed below  INTERVAL HISTORY: Yentl returns today for followup of her breast cancer Accompanied by her son. Since her last visit here she suffered a hemorrhagic stroke. She  was in [place] at the time. She was eventually flown to [facility]. She is making a good recovery , currently staying at her stepsons'.  She has mild headaches occasionally which are new and are easily controlled with Tylenol. From a breast cancer point of view she continues on letrozole. She is not aware of any side effects from that medication it she obtains it at a good price.  REVIEW OF SYSTEMS:  She is having some visual issues likely related to her recent stroke, but denies falls. She has no difficulty swallowing or pain on swallowing. Has been no cough, phlegm production, or pleurisy. There's been no change in bowel or bladder habits. A detailed review of systems today was otherwise stable.  PAST MEDICAL HISTORY: Past Medical History  Diagnosis Date  . Hypertension   . Breast cancer 2011    Stage 3  . Atrial fibrillation   . Hypercholesterolemia   . Abnormal ECG    1. Significant for pituitary adenoma which was removed in 1983 but recurred, requiring radiation and briefly some Parlodel.  She has been off treatment since 1985.  She has some acromegaly secondary to this tumor.   2. She is status post bilateral submuscular breast implants under Dr. Towanda Malkin in 1987.   3. She is status post C-section for her third child who was hydrocephalic.   4. She underwent rhinoplasty in 1984.   5. She has a history of hypertension. 6. History of mitral valve prolapse, but she says she has not been  receiving antibiotics prior to dental or similar procedures.   7. History of hypothyroidism.   8. History of congenital absence of one kidney.   9. History of hyperlipidemia.  10. History of obesity.   11. History of renal stones.   12. History of fatty liver.   13. History of gout.   14. History of palpitations.   History of childhood asthma  PAST SURGICAL HISTORY: Past Surgical History  Procedure Laterality Date  . Cesarean section  1980  . Breast enhancement surgery  1987  .  Umbilical hernia repair    . Tubal ligation    . Scar revision    . Pituitary surgery      adenoma  . Rhinoplasty  1985    FAMILY HISTORY Family History  Problem Relation Age of Onset  . Heart disease Mother   . Heart attack Father   . Heart attack Brother    The patient's father died from a myocardial infarction at the age of 79.  The patient's mother died from complications of congestive heart failure at the age of 32.  The patient had one brother who died from a myocardial infarction at the age of 40.  One of the brothers and two sisters are alive and well.   There is no history of breast or ovarian cancer in the family to her knowledge.  GYNECOLOGIC HISTORY: She is GX P3, first pregnancy to term at age 34.  She went through the change of life at the time of her pituitary surgery in 1983.  She took hormone replacement for less than a year because of poor tolerance.    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  She gave up her job in 2010-08-10.  Her first husband died from chronic myeloid leukemia in 1985.  Her three children from that marriage include her son Randall Hiss who died from complications of hydrocephaly at age 68; son [name] who is a Insurance underwriter and son [name] who is an Public house manager in this area.  The patient has five grandchildren.  Her second husband of 20+ years, Ron, is a Company secretary of an independent [employer] and also does Writer and is a Oceanographer.  He lost his first wife to endometrial cancer.  He has a daughter from that marriage.  She lives in [place]: not in place  HEALTH MAINTENANCE: History  Substance Use Topics  . Smoking status: Never Smoker   . Smokeless tobacco: Not on file  . Alcohol Use: No     Colonoscopy:  PAP:  Bone density: January 2012/ osteopenia  Lipid panel:  Allergies  Allergen Reactions  . Ciprofloxacin Other (See Comments)  .  Clonidine Derivatives Other (See Comments)  . Phenytoin     Other reaction(s): Other Elevated LFTs  . Sulfa Antibiotics Hives  . Benzonatate     REACTION: Swelling  . Codeine     REACTION: Nausea    Current Outpatient Prescriptions  Medication Sig Dispense Refill  . aspirin (ASPIRIN CHILDRENS) 81 MG chewable tablet Chew 1 tablet (81 mg total) by mouth daily. 100 tablet 4  . atorvastatin (LIPITOR) 40 MG tablet Take 1 tablet (40 mg total) by mouth daily. 90 tablet 4  . famotidine (PEPCID) 20 MG tablet Take 1 tablet (20 mg total) by mouth 2 (two) times daily. 180 tablet 4  . Lacosamide (VIMPAT) 100 MG TABS Take 1 tablet (100  mg total) by mouth 2 (two) times daily. 60 tablet 3  . letrozole (FEMARA) 2.5 MG tablet Take 1 tablet (2.5 mg total) by mouth daily. 90 tablet 4  . levothyroxine (SYNTHROID, LEVOTHROID) 100 MCG tablet Take 88 mcg by mouth daily before breakfast.    . losartan (COZAAR) 50 MG tablet Take 50 mg by mouth daily.    . metoprolol (LOPRESSOR) 50 MG tablet Take 50 mg by mouth 2 (two) times daily.     No current facility-administered medications for this visit.    OBJECTIVE: Middle-aged white woman Who appears stated age 36 Vitals:   11/04/14 1421  BP: 149/77  Pulse: 67  Temp: 98.5 F (36.9 C)  Resp: 18     Body mass index is 32.03 kg/(m^2).    ECOG FS: 1  Sclerae unicteric, pupils equal and  round Oropharynx clear and moist-- no thrush or other lesions No cervical or supraclavicular adenopathy Lungs no rales or rhonchi Heart regular rate and rhythm Abd soft,  Obese,nontender, positive bowel sounds MSK no focal spinal tenderness, no upper extremity lymphedema Neuro: nonfocal and specifically motor is 5 over 5 both upper and lower extremities, well oriented with some mild memory deficits,  positive affect Breasts:  The right breast is unremarkable. The left breast is status post remote biopsy and radiation. There is no evidence of chest wall recurrence. The left  axilla is benign.    LAB RESULTS: Lab Results  Component Value Date   WBC 6.7 11/04/2014   NEUTROABS 4.3 11/04/2014   HGB 14.8 11/04/2014   HCT 40.9 11/04/2014   MCV 96.0 11/04/2014   PLT 176 11/04/2014      Chemistry      Component Value Date/Time   NA 136 11/04/2014 1346   NA 138 05/14/2013 1031   K 4.3 11/04/2014 1346   K 4.5 05/14/2013 1031   CL 101 05/14/2013 1031   CO2 25 11/04/2014 1346   CO2 32 05/14/2013 1031   BUN 14.9 11/04/2014 1346   BUN 18 05/14/2013 1031   CREATININE 0.8 11/04/2014 1346   CREATININE 0.9 05/14/2013 1031      Component Value Date/Time   CALCIUM 9.4 11/04/2014 1346   CALCIUM 9.5 05/14/2013 1031   ALKPHOS 197* 11/04/2014 1346   ALKPHOS 120* 05/14/2013 1031   AST 59* 11/04/2014 1346   AST 93* 05/14/2013 1031   ALT 50 11/04/2014 1346   ALT 110* 05/14/2013 1031   BILITOT 0.73 11/04/2014 1346   BILITOT 1.3* 05/14/2013 1031     Outpatiet labs provided by patient collected 10/28/2013  WBC8.0 HCT 34.3 MCV 101 Plat 199  BS 89  Creat 0.76 Calcium 9.7 ALK phos 147 AST 52 ALT 67 TB 0.7  STUDIES: CLINICAL DATA: Postmenopausal. History of breast cancer. The patient has taken tamoxifen for the past 3 years.  EXAM: DUAL X-RAY ABSORPTIOMETRY (DXA) FOR BONE MINERAL DENSITY  FINDINGS: AP LUMBAR SPINE  Bone Mineral Density (BMD): 0.847 g/cm2  Young Adult T-Score: -1.8  Z-Score: 0.0  LEFT FEMUR NECK  Bone Mineral Density (BMD): 0.767 g/cm2  Young Adult T-Score: -0.7  Z-Score: 0.8  ASSESSMENT: Patient's diagnostic category is LOW BONE MASS by WHO Criteria.  FRACTURE RISK: Moderate  FRAX: Based on the [place], the 10 year probability of a major osteoporotic fracture is 11%. The 10 year probability of a hip fracture is 0.7%.  COMPARISON: 09/21/2010. Since that time, there has been a statistically significant 3.3% decrease in bone mineral density of  the lumbar spine and a  statistically significant 6.4% increase in bone mineral density of the left hip.  Mm Digital Diagnostic Unilat L  10/26/2014   CLINICAL DATA:  Palpable lump in the upper outer quadrant of the left breast which the patient noticed initially approximately 1 week ago. Prior malignant lumpectomy of the left breast in 2011.  EXAM: DIGITAL DIAGNOSTIC LEFT MAMMOGRAM WITH IMPLANTS AND CAD  ULTRASOUND LEFT BREAST  COMPARISON:  Mammography 11/05/2013 dating back to 10/30/2007. Left breast ultrasound 10/31/2009, 04/13/2010.  ACR Breast Density Category b: There are scattered areas of fibroglandular density.  FINDINGS: The patient has retropectoral implants. Standard and implant displaced views were performed. A spot tangential view of the area of palpable concern in the upper outer left breast was also obtained.  Post lumpectomy scarring in the upper outer quadrant of the left breast, unchanged. No new or suspicious findings in the left breast.  Mammographic images were processed with CAD.  On physical exam, there is no discrete palpable mass in the upper outer quadrant of the left breast. The implant is palpable and is firm.  Targeted ultrasound is performed, showing normal fibrofatty and scattered fibroglandular tissue throughout the upper outer quadrant of the left breast, including the area of palpable concern. No cyst, solid mass or abnormal acoustic shadowing was identified.  IMPRESSION: No mammographic or sonographic evidence of malignancy, left breast.  RECOMMENDATION: Annual bilateral diagnostic mammography which is due in 1 month.  I have discussed the findings and recommendations with the patient. Results were also provided in writing at the conclusion of the visit. If applicable, a reminder letter will be sent to the patient regarding the next appointment.  BI-RADS CATEGORY  2: Benign.   Electronically Signed   By: Evangeline Dakin M.D.   On: 10/26/2014 16:37   US Breast Ltd Uni Left Inc Axilla  10/26/2014    CLINICAL DATA:  Palpable lump in the upper outer quadrant of the left breast which the patient noticed initially approximately 1 week ago. Prior malignant lumpectomy of the left breast in 2011.  EXAM: DIGITAL DIAGNOSTIC LEFT MAMMOGRAM WITH IMPLANTS AND CAD  ULTRASOUND LEFT BREAST  COMPARISON:  Mammography 11/05/2013 dating back to 10/30/2007. Left breast ultrasound 10/31/2009, 04/13/2010.  ACR Breast Density Category b: There are scattered areas of fibroglandular density.  FINDINGS: The patient has retropectoral implants. Standard and implant displaced views were performed. A spot tangential view of the area of palpable concern in the upper outer left breast was also obtained.  Post lumpectomy scarring in the upper outer quadrant of the left breast, unchanged. No new or suspicious findings in the left breast.  Mammographic images were processed with CAD.  On physical exam, there is no discrete palpable mass in the upper outer quadrant of the left breast. The implant is palpable and is firm.  Targeted ultrasound is performed, showing normal fibrofatty and scattered fibroglandular tissue throughout the upper outer quadrant of the left breast, including the area of palpable concern. No cyst, solid mass or abnormal acoustic shadowing was identified.  IMPRESSION: No mammographic or sonographic evidence of malignancy, left breast.  RECOMMENDATION: Annual bilateral diagnostic mammography which is due in 1 month.  I have discussed the findings and recommendations with the patient. Results were also provided in writing at the conclusion of the visit. If applicable, a reminder letter will be sent to the patient regarding the next appointment.  BI-RADS CATEGORY  2: Benign.   Electronically Signed   By: Evangeline Dakin  M.D.   On: 10/26/2014 16:37    ASSESSMENT: 69 y.o. [place], [place] woman  (1) status post left breast biopsy in February of 2011 for an invasive lobular carcinoma measuring 2.9 cm by MRI, with a positive  prechemotherapy axillary lymph node biopsy, strongly estrogen and progesterone receptor positive with no evidence of HER-2/neu amplification and an MIB-1 of 14%.   (2) received 6 cycles of docetaxel/cyclophosphamide in the neoadjuvant setting, completed in mid July of 2011   (3) s/p left lumpectomy and axillary lymph node dissection in August of 2011 for a ypT1b yTN3 (14 positive lymph nodes), Stage IIIC, grade 1 residual tumor. HER-2/neu was repeated, again not amplified.   (4) Completed loco-regional radiation in late October of 2011   (5) on letrozole starting early November 2011, with good tolerance   PLAN:   Gianni is recovering well from her stroke. She does have some new limitations. The headaches are going to be secondary to irritation from the blood and hopefully they will improve. Thankfully they are not terribly serious and she is doing well controlling them on Tylenol.  From a breast cancer point of view she is doing very well. She will complete 5 years of letrozole this November and she will be ready to "graduate" at that point.   I went ahead and revised all her medications. Her hydrochlorothiazide/ triamterene has dropped out of her medicine medication list. If additional blood pressure control were decided that could be added again.   She will need to have her right mammogram in March. That order was put in as well.   data concerned because someone had  Added a "congestive heart failure" diagnosis to her chart. She had an echocardiogram with normal ejection fraction here in 2014. That apparently has been corrected.  They know to call for any problems that may develop the for her next visit here.  [name], MD 2:56 PM  11/04/2014   Oncology/hematology

## 2014-11-04 NOTE — Telephone Encounter (Signed)
per pof to sch to sch pt appt-sch pt mamma-gave pt copy of sch

## 2014-12-03 ENCOUNTER — Ambulatory Visit
Admission: RE | Admit: 2014-12-03 | Discharge: 2014-12-03 | Disposition: A | Discharge status: Home / Self Care | Payer: Medicare Other | Source: Ambulatory Visit | Attending: Oncology | Admitting: Oncology

## 2014-12-03 DIAGNOSIS — C50912 Malignant neoplasm of unspecified site of left female breast: Secondary | ICD-10-CM

## 2015-05-10 ENCOUNTER — Telehealth: Payer: Self-pay | Admitting: *Deleted

## 2015-05-10 NOTE — Telephone Encounter (Signed)
This RN received call from pt stating she has recently had a CT of the abdomen " which shows a mass on my adrenal gland ".  Maria Pruitt is now living in [place] due to having a stroke in January 2016 - " but the Maria Pruitt has touched me and though I was on the ventilator for 3 weeks and then had to have a lot of rehab - I am pretty much back to my normal except forget words at time "  Maria Pruitt went to her primary MD due to ongoing nausea and vomiting- obtained the CT.  Her inquiry is - who should do the biopsy and if surgery is needed who would Dr Jannifer Rodney recommend.  Return call number for patient given as [phone].  This RN obtained results per Care Everywhere of CT.  Per review with MD recommendation is to proceed with biopsy per IR in [place].  Then if it is positive for cancer schedule an appointment to be seen by Dr Jannifer Rodney.  This RN attempted to contact patient. Pt not at home at the time.

## 2015-05-13 ENCOUNTER — Telehealth: Payer: Self-pay

## 2015-05-13 NOTE — Telephone Encounter (Signed)
Writer called patient back regarding questions on biopsy.  Per Dr. Jana Hakim patient should go ahead and schedule biopsy in [place] then call and schedule a follow up with Dr. Jana Hakim .  LVM at patient's home, also spoke to patient's daughter in law and she was given information.  She stated an understanding and will call back to schedule an appt after the biopsy has been scheduled.

## 2015-05-17 ENCOUNTER — Telehealth: Payer: Self-pay | Admitting: *Deleted

## 2015-05-17 ENCOUNTER — Telehealth: Payer: Self-pay

## 2015-05-17 NOTE — Telephone Encounter (Signed)
"  DR.MAGRINAT'S NURSE KNOWS THE SITUATION".

## 2015-05-17 NOTE — Telephone Encounter (Signed)
Patient called regarding having a repeat CT scan of her abdomen prior to having the biopsy of the mass on her adrenal gland.  Writer called the number back that the patient left on VM and reached her son.  Patient and her husband are presently living with patient's son since she had her stroke.  Patient's son states that patient no longer feels that she has an adrenal mass and that is why she is requesting a repeat CT scan.  Writer explained that the procedure will be done with imaging and if the lesion has disappeared the biopsy will not be done.  Patient's son stated an understanding of the situation especially after it was stressed that patient have the biopsy if the mass is there so treatment can be started now rather then later if applicable.  Patient to have biopsy on 05/25/15.

## 2015-05-27 ENCOUNTER — Telehealth: Payer: Self-pay | Admitting: *Deleted

## 2015-05-27 NOTE — Telephone Encounter (Signed)
Call received via voicemail from patient to "Update Dr. Jana Hakim on my progress here for biopsy of adrenal gland.  Tuesday's biopsy was postponed till Monday (05-30-2015)because I wasn't instructed to stop the aspirin for five days.  I currently have a cold so this was not neglected on my part."

## 2015-05-28 NOTE — Telephone Encounter (Signed)
Please thanks  The patient for calling and reassure her she's not at fault. Please ask her to call us a few days after Bx to discuss recults

## 2015-06-03 ENCOUNTER — Telehealth: Payer: Self-pay | Admitting: *Deleted

## 2015-06-03 NOTE — Telephone Encounter (Signed)
Patient called stating that she would like to know the results of her recent biopsy. Message forwarded to MD Magrinat

## 2015-06-06 ENCOUNTER — Other Ambulatory Visit: Payer: Self-pay | Admitting: Oncology

## 2015-06-07 ENCOUNTER — Other Ambulatory Visit: Payer: Self-pay | Admitting: Oncology

## 2015-06-07 NOTE — Telephone Encounter (Signed)
Cannot find results in EPIC-- please f/u, let me have copy of results and I can call pt

## 2015-06-08 ENCOUNTER — Other Ambulatory Visit (HOSPITAL_BASED_OUTPATIENT_CLINIC_OR_DEPARTMENT_OTHER): Payer: Medicare Other

## 2015-06-08 ENCOUNTER — Ambulatory Visit (HOSPITAL_BASED_OUTPATIENT_CLINIC_OR_DEPARTMENT_OTHER): Payer: Medicare Other | Admitting: Oncology

## 2015-06-08 VITALS — BP 142/51 | HR 90 | Temp 98.1°F | Resp 18 | Ht 64.0 in | Wt 176.7 lb

## 2015-06-08 DIAGNOSIS — C50912 Malignant neoplasm of unspecified site of left female breast: Secondary | ICD-10-CM | POA: Diagnosis not present

## 2015-06-08 DIAGNOSIS — C7972 Secondary malignant neoplasm of left adrenal gland: Secondary | ICD-10-CM

## 2015-06-08 DIAGNOSIS — C773 Secondary and unspecified malignant neoplasm of axilla and upper limb lymph nodes: Secondary | ICD-10-CM

## 2015-06-08 DIAGNOSIS — E278 Other specified disorders of adrenal gland: Secondary | ICD-10-CM | POA: Insufficient documentation

## 2015-06-08 DIAGNOSIS — Z8673 Personal history of transient ischemic attack (TIA), and cerebral infarction without residual deficits: Secondary | ICD-10-CM

## 2015-06-08 DIAGNOSIS — C50919 Malignant neoplasm of unspecified site of unspecified female breast: Secondary | ICD-10-CM

## 2015-06-08 LAB — COMPREHENSIVE METABOLIC PANEL (CC13)
ALT: 24 U/L (ref 0–55)
ANION GAP: 9 meq/L (ref 3–11)
AST: 33 U/L (ref 5–34)
Albumin: 3.7 g/dL (ref 3.5–5.0)
Alkaline Phosphatase: 156 U/L — ABNORMAL HIGH (ref 40–150)
BILIRUBIN TOTAL: 0.79 mg/dL (ref 0.20–1.20)
BUN: 17.8 mg/dL (ref 7.0–26.0)
CALCIUM: 9.1 mg/dL (ref 8.4–10.4)
CO2: 27 meq/L (ref 22–29)
CREATININE: 0.8 mg/dL (ref 0.6–1.1)
Chloride: 99 mEq/L (ref 98–109)
EGFR: 72 mL/min/{1.73_m2} — ABNORMAL LOW (ref >90)
Glucose: 95 mg/dl (ref 70–140)
Potassium: 3.7 mEq/L (ref 3.5–5.1)
Sodium: 135 mEq/L — ABNORMAL LOW (ref 136–145)
TOTAL PROTEIN: 7.1 g/dL (ref 6.4–8.3)

## 2015-06-08 LAB — CBC WITH DIFFERENTIAL/PLATELET
BASO%: 0.5 % (ref 0.0–2.0)
Basophils Absolute: 0 10*3/uL (ref 0.0–0.1)
EOS ABS: 0.1 10*3/uL (ref 0.0–0.5)
EOS%: 1.9 % (ref 0.0–7.0)
HCT: 36 % (ref 34.8–46.6)
HGB: 12.9 g/dL (ref 11.6–15.9)
LYMPH%: 25.7 % (ref 14.0–49.7)
MCH: 34.2 pg — ABNORMAL HIGH (ref 25.1–34.0)
MCHC: 35.8 g/dL (ref 31.5–36.0)
MCV: 95.5 fL (ref 79.5–101.0)
MONO#: 0.8 10*3/uL (ref 0.1–0.9)
MONO%: 12.8 % (ref 0.0–14.0)
NEUT#: 3.7 10*3/uL (ref 1.5–6.5)
NEUT%: 59.1 % (ref 38.4–76.8)
PLATELETS: 181 10*3/uL (ref 145–400)
RBC: 3.77 10*6/uL (ref 3.70–5.45)
RDW: 12.3 % (ref 11.2–14.5)
WBC: 6.2 10*3/uL (ref 3.9–10.3)
lymph#: 1.6 10*3/uL (ref 0.9–3.3)

## 2015-06-08 NOTE — Progress Notes (Signed)
. ID: Maria Pruitt   DOB: 02-19-1946  MR#: [id number]  EVO#:[id number]  Maria Nova, PA-C GYN:  SU:  OTHER MD:    CHIEF COMPLAINT: left adrenal metastatsis CURRENT TREATMENT: workup in progress  HISTORY OF PRESENT ILLNESS:  from the original intake note:  Maria Pruitt herself palpated a change in her left breast, shortly before her mammogram was due.  She brought this to [name] attention and he set her up for diagnostic mammography on October 31, 2009.   Routine and implant displaced views of both breasts showed a 2 cm irregular mass in the outer left breast, seen only on the spot tangential view.  The breast tissue itself was fatty.  There were no suspicious calcifications.  This mass was palpable to Dr. [name].  An ultrasound showed it to be 1.7 cm irregular and hypoechoic.  There were at least two enlarged left axillary lymph nodes identified.  Dr. [name] recommended biopsy which was performed the same day and showed ([id number]) and invasive breast cancer felt most likely to be ductal with lobular features.  The left axillary lymph node biopsy also showed the same tumor (similar morphologic findings) and both prognostic profiles showed the cancer to be strongly ER and PR positive with a low to borderline proliferation fraction (13 and 14%).  Neither mass showed amplification of Her-2 by CISH.  (The ratios were 1.1 and 0.85.)    With this information, the patient was referred to Dr. [name] and bilateral breast MRIs were obtained March 1st.  This showed several left axillary lymph nodes with cortical thickening, the largest one measuring 1.1 cm.  The mass itself measured 2.9 cm by MRI.  It was spiculated and irregular.  There was no evidence of contralateral disease.   Her subsequent history is as detailed below  INTERVAL HISTORY: Maria Pruitt returns today for a new problem accompanied by her sister Maria Pruitt. To summarize the recent history: She had a hemorrhagic stroke in January 2016  with some residuals. She has been living next door to her son in the [place] area since then.--In August she presented to her primary care physician, Dr. Thornton Papas, with a complaint of abdominal discomfort, nausea and vomiting. He treated her for a UTI w/o resolution and obtained a KUB in his office; this was nondiagnostic. Accordingly he set up the patient for CT scan of the abdomen and pelvis 05/06/2015. This showed the left adrenal gland to be enlarged to 7.5 x 4.0 cm. There was eccentric wall thickening of the gastric cardia. There were small lymph nodes in the upper abdomen and retroperitoneum. There was no liver involvement. The right adrenal gland was unremarkable. There was severe left kidney atrophy (congenital).  The patient was then set up for biopsy of the left adrenal mass on 05/30/2015. This showed Maria Pruitt [id number]) metastatic carcinoma which was cytokeratin 7 positive, with rare cytokeratin 20 positivity and was negative for gross cystic disease fluid protein. The prognostic panel showed this tumor to be estrogen and progesterone receptor negative, HER-2 negative, with an MIB-1 of 90-100%.  The patient is now referred for further evaluation and treatment.  REVIEW OF SYSTEMS: Maria Pruitt generally has done well from her stroke. She did have a seizure and is on anti-seizure medicine, which she finds very expensive. She does housework, works in the garden with her son, and is the main caregiver for her husband, who lives with her, and who is disabled secondary to a stroke and an automobile accident (severe traumatic brain injury). He needs  assistant with dressing, and pretty much all activities of daily living.--She complains of pain in the left upper quadrant of the abdomen when she eats a full meal, especially if she has not had a bowel movement earlier in the day. Sometimes she eats and it doesn't hurt. She has nausea pretty much all the time but no vomiting. She describes the nausea is mild. She also  complains of back pain. When she sits a church she puts a prior book behind her and that helps. Bowel movements are regular, about every other day, normal in color, no melena, no blood. She does state that her appetite has been decreased. Her sense of taste has not changed. As far as his drug is concerned she denies headaches, and her peripheral vision she says is getting a little better. She hasn't had any recent falls. She has allergy symptoms but no significant cough, shortness of breath, phlegm production or pleurisy. She has stress urinary incontinence which is not a new symptom. A detailed review of systems today was otherwise stable  PAST MEDICAL HISTORY: Past Medical History  Diagnosis Date  . Hypertension   . Breast cancer 2011    Stage 3  . Atrial fibrillation   . Hypercholesterolemia   . Abnormal ECG    1. Significant for pituitary adenoma which was removed in 1983 but recurred, requiring radiation and briefly some Parlodel.  She has been off treatment since 1985.  She has some acromegaly secondary to this tumor.   2. She is status post bilateral submuscular breast implants under Dr. Towanda Malkin in 1987.   3. She is status post C-section for her third child who was hydrocephalic.   4. She underwent rhinoplasty in 1984.   5. She has a history of hypertension. 6. History of mitral valve prolapse, but she says she has not been receiving antibiotics prior to dental or similar procedures.   7. History of hypothyroidism.   8. History of congenital absence of one kidney.   9. History of hyperlipidemia.  10. History of obesity.   11. History of renal stones.   12. History of fatty liver.   13. History of gout.   14. History of palpitations.   History of childhood asthma  PAST SURGICAL HISTORY: Past Surgical History  Procedure Laterality Date  . Cesarean section  1980  . Breast enhancement surgery  1987  . Umbilical hernia repair    . Tubal ligation    . Scar revision    .  Pituitary surgery      adenoma  . Rhinoplasty  1985    FAMILY HISTORY Family History  Problem Relation Age of Onset  . Heart disease Mother   . Heart attack Father   . Heart attack Brother    The patient's father died from a myocardial infarction at the age of 81.  The patient's mother died from complications of congestive heart failure at the age of 36.  The patient had one brother who died from a myocardial infarction at the age of 72.  One of the brothers and two sisters are alive and well.  Sr. Maria Pruitt is an ICU nurse. There is no history of breast or ovarian cancer in the family to her knowledge.  GYNECOLOGIC HISTORY: She is GX P3, first pregnancy to term at age 15.  She went through the change of life at the time of her pituitary surgery in 1983.  She took hormone replacement for less than a year because of poor tolerance.  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  She gave up her job in 08/12/10.  Her first husband died from chronic myeloid leukemia in 1985.  Her three children from that marriage include her son Randall Hiss who died from complications of hydrocephaly at age 26; son [name] who is a Insurance underwriter and son [name] who is an Public house manager in this area.  The patient has five grandchildren.  Her second husband of 20+ years, Ron, is a Company secretary of an independent [employer] and also does Writer and is a Oceanographer. He is now disabled due toa traumatic brain injury. He lost his first wife to endometrial cancer.  He has a daughter from that marriage.  She lives in [place]: not in place  HEALTH MAINTENANCE: Social History  Substance Use Topics  . Smoking status: Never Smoker   . Smokeless tobacco: Not on file  . Alcohol Use: No     Colonoscopy:  PAP:  Bone density: January 2012/ osteopenia  Lipid panel:  Allergies  Allergen Reactions  . Ciprofloxacin Other (See  Comments)  . Clonidine Derivatives Other (See Comments)  . Phenytoin     Other reaction(s): Other Elevated LFTs  . Sulfa Antibiotics Hives  . Benzonatate     REACTION: Swelling  . Codeine     REACTION: Nausea    Current Outpatient Prescriptions  Medication Sig Dispense Refill  . aspirin (ASPIRIN CHILDRENS) 81 MG chewable tablet Chew 1 tablet (81 mg total) by mouth daily. 100 tablet 4  . atorvastatin (LIPITOR) 40 MG tablet Take 1 tablet (40 mg total) by mouth daily. 90 tablet 4  . famotidine (PEPCID) 20 MG tablet Take 1 tablet (20 mg total) by mouth 2 (two) times daily. 180 tablet 4  . hydrochlorothiazide (HYDRODIURIL) 25 MG tablet Take 1 tablet (25 mg total) by mouth daily.    . Lacosamide (VIMPAT) 100 MG TABS Take 1 tablet (100 mg total) by mouth 2 (two) times daily. 60 tablet 3  . letrozole (FEMARA) 2.5 MG tablet Take 1 tablet (2.5 mg total) by mouth daily. 90 tablet 4  . levothyroxine (SYNTHROID, LEVOTHROID) 100 MCG tablet Take 88 mcg by mouth daily before breakfast.    . losartan (COZAAR) 50 MG tablet Take 50 mg by mouth daily.    . metoprolol (LOPRESSOR) 50 MG tablet Take 50 mg by mouth 2 (two) times daily.     No current facility-administered medications for this visit.    OBJECTIVE: Middle-aged white woman in no acute distress Filed Vitals:   06/08/15 1628  BP: 142/51  Pulse: 90  Temp: 98.1 F (36.7 C)  Resp: 18     Body mass index is 30.32 kg/(m^2).    ECOG FS: 1  Sclerae unicteric, EOMs intact, pupils round and equal Oropharynx clear, and moist No cervical or supraclavicular adenopathy Lungs no rales or rhonchi Heart regular rate and rhythm Abd soft, nontender including palpation of the left upper quadrant, positive bowel sounds MSK no focal spinal tenderness to vigorous percussion including the lumbar spine Neuro: nonfocal, well oriented, appropriate affect Breasts: Implants bilaterally; right breast otherwise unremarkable; left breast status post lumpectomy and  radiation, with no evidence of local recurrence. Both axillae are benign.     LAB RESULTS: Lab Results  Component Value Date   WBC 6.2 06/08/2015   NEUTROABS 3.7 06/08/2015   HGB 12.9 06/08/2015   HCT 36.0 06/08/2015  MCV 95.5 06/08/2015   PLT 181 06/08/2015      Chemistry      Component Value Date/Time   NA 135* 06/08/2015 1611   NA 138 05/14/2013 1031   K 3.7 06/08/2015 1611   K 4.5 05/14/2013 1031   CL 101 05/14/2013 1031   CO2 27 06/08/2015 1611   CO2 32 05/14/2013 1031   BUN 17.8 06/08/2015 1611   BUN 18 05/14/2013 1031   CREATININE 0.8 06/08/2015 1611   CREATININE 0.9 05/14/2013 1031      Component Value Date/Time   CALCIUM 9.1 06/08/2015 1611   CALCIUM 9.5 05/14/2013 1031   ALKPHOS 156* 06/08/2015 1611   ALKPHOS 120* 05/14/2013 1031   AST 33 06/08/2015 1611   AST 93* 05/14/2013 1031   ALT 24 06/08/2015 1611   ALT 110* 05/14/2013 1031   BILITOT 0.79 06/08/2015 1611   BILITOT 1.3* 05/14/2013 1031      STUDIES:    PROCEDURE:  QCT 5053- CT ABD/PELVIS W/CONT - May 06 2015    [employer] Accession #: [id number] [facility] Accession #: [id number]     TECHNIQUE: Abdomen pelvic CT with contrast. 75 cc Isovue-370 IV. Multiple  contiguous axial images obtained.  HISTORY: Left lower quadrant pain. Breast cancer.   FINDINGS: Bilateral breast implants. Small hiatal hernia. Liver, spleen,  pancreas, right adrenal gland and right kidney unremarkable. Severe  atrophy left kidney. Complex process involving left adrenal gland  extending inferiorly measuring 7.5 x 4 cm. Eccentric wall thickening  gastric cardia. Small lymph nodes upper abdomen and retroperitoneum.  Small fat-containing periumbilical hernia. Vascular calcifications.  Appendix unremarkable.  Uterus and bladder unremarkable. No adnexal mass identified.      IMPRESSION: 1. Complex 7.5 x 4 cm mass involving left adrenal gland and extending  inferiorly. Its appearance is worrisome for a metastatic  process, but  small amount of hemorrhage cannot be excluded. A primary adrenal neoplasm  could also have this appearance. 2. Small upper abdominal and retroperitoneal lymph nodes. 3. Eccentric wall thickening gastric fundus. If clinically indicated,  upper endoscopy could be helpful in further evaluation.   ___________________________________________________________ Current Report Read byDorthula Perfect  [id number] on May 06 2015  3:59P Transcribed byJari Pigg   on May 06 2015  4:10P Electronically Signed by:  DR. Dorthula Perfect on:  May 06 2015  4:10P   Narrative  PROCEDURE:CT-guided percutaneous biopsy of the left adrenal mass.    DATE: 05/30/2015    CLINICAL HISTORY:Maria Pruitt with history of pituitary tumor and breast cancer with large left adrenal mass. Atrophic left kidney.    COMPARISON: CT May 06, 2015.    TECHNIQUE/FINDINGS:   Risks, benefits and alternatives to the procedure were discussed prior to the procedure.All questions were answered and written informed consent was obtained. A timeout was performed immediately prior to the procedure to confirm patient identity,   birthdate, and procedure to be performed including laterality.    Patient was placed on the CT table in the left lateral decubitus position and scout axial CT images obtained. This demonstrated left adrenal mass. Once appropriate site for skin entry was identified, the skin was prepped and draped in routine sterile   fashion utilizing maximum sterile barrier technique throughout the procedure. Buffered lidocaine was then injected subcutaneously for local anesthesia.    Under CT guidance, a 20-gauge 15 cm guider needle was advanced into the lesion from a left posterior paravertebral approach and core biopsy samples were obtained with the automated  biopsy gun. Adequate tissue specimens were obtained and given to   pathology for further analysis. The needle was removed  and hemostasis achieved with manual compression. Skin was cleaned and sterile dressing applied.The patient tolerated the procedure well, no immediate complications were identified. The patient was   comfortable throughout the procedure.      CT Needle Biopsy Adrenal Glands (05/30/2015 3:04 PM)  Procedure Note  Acute Interface, Incoming Rad Results - 05/31/2015 1:01 PM EDT Acute Interface, Incoming Rad Results - 05/31/2015 1:01 PM EDT  PROCEDURE: CT-guided percutaneous biopsy of the left adrenal mass.  DATE: 05/30/2015  CLINICAL HISTORY: Maria Pruitt with history of pituitary tumor and breast cancer with large left adrenal mass. Atrophic left kidney.  COMPARISON: CT May 06, 2015.  TECHNIQUE/FINDINGS:  Risks, benefits and alternatives to the procedure were discussed prior to the procedure. All questions were answered and written informed consent was obtained. A timeout was performed immediately prior to the procedure to confirm patient identity,  birthdate, and procedure to be performed including laterality.  Patient was placed on the CT table in the left lateral decubitus position and scout axial CT images obtained. This demonstrated left adrenal mass. Once appropriate site for skin entry was identified, the skin was prepped and draped in routine sterile  fashion utilizing maximum sterile barrier technique throughout the procedure. Buffered lidocaine was then injected subcutaneously for local anesthesia.  Under CT guidance, a 20-gauge 15 cm guider needle was advanced into the lesion from a left posterior paravertebral approach and core biopsy samples were obtained with the automated biopsy gun. Adequate tissue specimens were obtained and given to  pathology for further analysis. The needle was removed and hemostasis achieved with manual compression. Skin was cleaned and sterile dressing applied. The patient tolerated the procedure well, no immediate  complications were identified. The patient was  comfortable throughout the procedure.   IMPRESSION:  Successful and uncomplicated CT-guided percutaneous biopsy of the large left adrenal mass.       Gayla Doss MD   PATHOLOGIST   (SUPPLEMENTAL REPORT SIGNED 06/01/2015)   (REPORTED: 06/02/2015 AT 11:59)       DIAGNOSIS     ADRENAL GLAND, LEFT, NEEDLE CORE BIOPSY:   -METASTATIC CARCINOMA.     NOTE:BY IMMUNOHISTOCHEMICAL STAINS, TUMOR CELLS ARE POSITIVE FOR   CYTOKERATIN 7, HAVE RARE POSITIVITY FOR CYTOKERATIN 20 AND ARE NEGATIVE   FOR GCDFP.     THE STAINING PATTERN IS NOT SPECIFIC TO SITE BUT ON MORPHOLOGIC GROUNDS   AND IN CONSIDERATION OF THE PATIENT'S HISTORY OF BREAST CARCINOMA, WOULD   BE COMPATIBLE WITH BREAST PRIMARY.     ADDITIONAL STUDIES INCLUDING ER, PR AND HER-2/NEU ARE IN PROGRESS AND   THE RESULTS WILL BE REPORTED IN A SEPARATE ADDENDUM.   (BRA)         BILAL R. AHMAD MD   PATHOLOGIST   (CASE SIGNED 06/01/2015)    SUPPLEMENTAL REPORT   METASTATIC CARCINOMA.     BREAST CANCER PROGNOSTIC PANEL         TEST NAME STAINING PERCENT PROGNOSTIC SIGNIFICANCE  INTENSITYPOSITIVE   (AVERAGE)(AVERAGE)  ERNONE 0%UNFAVORABLE  PRNONE 0%UNFAVORABLE  HER2/NEU0NOT AMPLIFIED, SENT    FOR FISH  KI-67 STRONG 90-100% HIGH, UNFAVORABLE  REFERENCE  RANGES  TEST NAME FAVORABLEBORDERLINEUNFAVORABLE  ER> OR = 1%N/A < 1%  PR> OR = 1%N/A < 1%  HER2/NEU0 - 1+ 2+3+   KI-67 < OR = 20% N/A > 40%  NOTE: THE SPECIMEN INFORMALINBETWEEN 6 AND  72 HOURS;   FIXATIVE  PARAFFIN-EMBEDDEDSECTIONS. PERFORMED USING DAB   TEST  POLYMERHER2 CLONE SP3, CLONE SP1, PR CLONE 16,  DETECTION: ER   KI67 CLONE SP6.      Gayla Doss MD   PATHOLOGIST   (SUPPLEMENTAL REPORT SIGNED 06/01/2015)   (REPORTED: 06/02/2015 AT 11:59)       CLINICAL INFORMATION     HISTORY OF BREAST CANCER WITH PITUITARY MASS, ADRENAL MASS.     ASSESSMENT: 69 y.o. [place], [place] woman  (1) status post left breast biopsy in February of 2011 for an invasive lobular carcinoma measuring 2.9 cm by MRI, with a positive prechemotherapy axillary lymph node biopsy, strongly estrogen and progesterone receptor positive with no evidence of HER-2/neu amplification and an MIB-1 of 14%.   (2) received 6 cycles of docetaxel/cyclophosphamide in the neoadjuvant setting, completed in mid July of 2011   (3) s/p left lumpectomy and axillary lymph node dissection in August of 2011 for a ypT1b yTN3 (14 positive lymph nodes), Stage IIIC, grade 1 residual tumor. HER-2/neu was repeated, again not amplified.   (4) Completed loco-regional radiation in late October of 2011   (5) on letrozole starting early November 2011, with good tolerance   METASTATIC DISEASE: September 2016: Involving left adrenal gland  (6) biopsy of a 7.5 cm left adrenal mass 05/30/2015 showed metastatic carcinoma, estrogen, progesterone, and HER-2 negative, with an MIB-1 of 90-100%; the tumor cells were cytokeratin 7 positive, cytokeratin 20 focally positive, gross cystic disease fluid protein negative  (7) the patient is status post hemorrhagic CVA January 2016  (8) congenital absence of left kidney   PLAN:  I spent approximately an  hour with Maria Pruitt and her sister going over her situation. Lobular breast cancers are very distinctive and they're almost always estrogen receptor positive. I tried to get more information from pathology but multiple phone calls today proved unsuccessful. At any rate I do not believe we are dealing with melanoma metastasis from her old breast cancer. I think this is a different carcinoma and given the fact that she has persistent left upper quadrant discomfort associated with eating and the fact that some irregular thickening was noted on the gastric wall when she had her original CT in August, I think the money may be in an upper endoscopy.  Accordingly I am referring her to GI with that in mind. I am going to obtain a PET scan as well. Even if we get pathologic confirmation that this is a gastric cancer from the EGD we still need to complete her staging and that is what the PET will do for [place].  We discussed the fact that every type of cancer is treated differently and that until we know exactly what she has we will not know what her treatment options are. Because she wanted to know I did explain that metastatic carcinoma Gen. he is not curable. If this is gastric cancer we do have some combination chemotherapy regimens that can be effective and generally quite well tolerated. The goal would not be curable at control and we would have to carefully balance this with concerns regarding quality of life particularly since she already has other medical problems and because she is the primary caregiver for her husband who is severely disabled at this point.  I reassured her that if I thought treatment was futile I would let her know and we would then moved to a palliative/comfort care approach.  She will return to see me in 2 and  half weeks. I'm hopeful we will have the answer at that time and can come up with a definitive plan.    Maria Cruel, MD 5:37 PM  06/08/2015   Oncology/hematology

## 2015-06-09 ENCOUNTER — Telehealth: Payer: Self-pay | Admitting: Oncology

## 2015-06-09 ENCOUNTER — Encounter: Payer: Self-pay | Admitting: Internal Medicine

## 2015-06-09 ENCOUNTER — Telehealth: Payer: Self-pay | Admitting: Internal Medicine

## 2015-06-09 LAB — CEA: CEA: 9.7 ng/mL — AB (ref 0.0–5.0)

## 2015-06-09 LAB — CANCER ANTIGEN 27.29: CA 27.29: 125 U/mL — ABNORMAL HIGH (ref 0–39)

## 2015-06-09 LAB — CANCER ANTIGEN 19-9: CA 19-9: 19.4 U/mL (ref <35.0)

## 2015-06-09 NOTE — Telephone Encounter (Signed)
Sch'd Endo w/Dr. Carlean Purl for 06-16-15 and PV on 06-13-15 @ 2:30 per Barb Merino.

## 2015-06-09 NOTE — Telephone Encounter (Signed)
s.w. pt son and advised on OCT appt....ok and aware

## 2015-06-13 ENCOUNTER — Other Ambulatory Visit: Payer: Self-pay | Admitting: Oncology

## 2015-06-13 ENCOUNTER — Ambulatory Visit (AMBULATORY_SURGERY_CENTER): Payer: Self-pay | Admitting: *Deleted

## 2015-06-13 VITALS — Ht 64.0 in | Wt 175.0 lb

## 2015-06-13 DIAGNOSIS — R933 Abnormal findings on diagnostic imaging of other parts of digestive tract: Secondary | ICD-10-CM

## 2015-06-13 NOTE — Progress Notes (Signed)
No egg or soy allergy. No anesthesia problems.  No home O2.  No diet meds.  

## 2015-06-16 ENCOUNTER — Ambulatory Visit (AMBULATORY_SURGERY_CENTER): Payer: Medicare Other | Admitting: Internal Medicine

## 2015-06-16 ENCOUNTER — Encounter: Payer: Self-pay | Admitting: Internal Medicine

## 2015-06-16 VITALS — BP 158/99 | HR 87 | Temp 98.1°F | Resp 19 | Ht 64.0 in | Wt 175.0 lb

## 2015-06-16 DIAGNOSIS — K449 Diaphragmatic hernia without obstruction or gangrene: Secondary | ICD-10-CM

## 2015-06-16 DIAGNOSIS — R198 Other specified symptoms and signs involving the digestive system and abdomen: Secondary | ICD-10-CM

## 2015-06-16 DIAGNOSIS — R933 Abnormal findings on diagnostic imaging of other parts of digestive tract: Secondary | ICD-10-CM

## 2015-06-16 MED ORDER — SODIUM CHLORIDE 0.9 % IV SOLN: 500.0000 mL | INTRAVENOUS | Status: DC

## 2015-06-16 NOTE — Patient Instructions (Addendum)
You have a hiatal hernia - stomach moves from abdomen into chest. This is what was seen on the CT scan.  Good luck with your treatment.  I appreciate the opportunity to care for you. Gatha Mayer, MD, [facility]  Discharge instructions given. Handout on a hiatal hernia. Resume previous medications. YOU HAD AN ENDOSCOPIC PROCEDURE TODAY AT [facility]:   Refer to the procedure report that was given to you for any specific questions about what was found during the examination.  If the procedure report does not answer your questions, please call your gastroenterologist to clarify.  If you requested that your care partner not be given the details of your procedure findings, then the procedure report has been included in a sealed envelope for you to review at your convenience later.  YOU SHOULD EXPECT: Some feelings of bloating in the abdomen. Passage of more gas than usual.  Walking can help get rid of the air that was put into your GI tract during the procedure and reduce the bloating. If you had a lower endoscopy (such as a colonoscopy or flexible sigmoidoscopy) you may notice spotting of blood in your stool or on the toilet paper. If you underwent a bowel prep for your procedure, you may not have a normal bowel movement for a few days.  Please Note:  You might notice some irritation and congestion in your nose or some drainage.  This is from the oxygen used during your procedure.  There is no need for concern and it should clear up in a day or so.  SYMPTOMS TO REPORT IMMEDIATELY:    Following upper endoscopy (EGD)  Vomiting of blood or coffee ground material  New chest pain or pain under the shoulder blades  Painful or persistently difficult swallowing  New shortness of breath  Fever of 100F or higher  Black, tarry-looking stools  For urgent or emergent issues, a gastroenterologist can be reached at any hour by calling [phone].   DIET: Your first meal  following the procedure should be a small meal and then it is ok to progress to your normal diet. Heavy or fried foods are harder to digest and may make you feel nauseous or bloated.  Likewise, meals heavy in dairy and vegetables can increase bloating.  Drink plenty of fluids but you should avoid alcoholic beverages for 24 hours.  ACTIVITY:  You should plan to take it easy for the rest of today and you should NOT DRIVE or use heavy machinery until tomorrow (because of the sedation medicines used during the test).    FOLLOW UP: Our staff will call the number listed on your records the next business day following your procedure to check on you and address any questions or concerns that you may have regarding the information given to you following your procedure. If we do not reach you, we will leave a message.  However, if you are feeling well and you are not experiencing any problems, there is no need to return our call.  We will assume that you have returned to your regular daily activities without incident.  If any biopsies were taken you will be contacted by phone or by letter within the next 1-3 weeks.  Please call us at [phone] if you have not heard about the biopsies in 3 weeks.    SIGNATURES/CONFIDENTIALITY: You and/or your care partner have signed paperwork which will be entered into your electronic medical record.  These signatures attest to  the fact that that the information above on your After Visit Summary has been reviewed and is understood.  Full responsibility of the confidentiality of this discharge information lies with you and/or your care-partner.

## 2015-06-16 NOTE — Progress Notes (Signed)
Report to PACU, RN, vss, BBS= Clear.  

## 2015-06-16 NOTE — Op Note (Signed)
[facility]  [address] [place], [address]   ENDOSCOPY PROCEDURE REPORT  PATIENT: Maria, Pruitt  MR#: [id number] BIRTHDATE: 1946-06-08 , 16  yrs. old GENDER: female ENDOSCOPIST: Gatha Mayer, MD, [facility] PROCEDURE DATE:  06/16/2015 PROCEDURE:  EGD, diagnostic ASA CLASS:     Class III INDICATIONS:  abnormal CT of the GI tract. MEDICATIONS: Propofol 100 mg IV and Monitored anesthesia care TOPICAL ANESTHETIC: none  DESCRIPTION OF PROCEDURE: After the risks benefits and alternatives of the procedure were thoroughly explained, informed consent was obtained.  The LB [id number] endoscope was introduced through the mouth and advanced to the second portion of the duodenum , Without limitations.  The instrument was slowly withdrawn as the mucosa was fully examined.    Hiatal hernia 35-39 cm.  Otherwise normal EGD.  Retroflexed views revealed a hiatal hernia.     The scope was then withdrawn from the patient and the procedure completed.  COMPLICATIONS: There were no immediate complications.  ENDOSCOPIC IMPRESSION: Hiatal hernia 35-39 cm.  Otherwise normal EGD  RECOMMENDATIONS: No further GI eval at this time   eSigned:  Gatha Mayer, MD, [facility] 06/16/2015 2:41 PM    CC: The Patient, Thornton Papas, MD and Gus Magrinat, MD

## 2015-06-17 ENCOUNTER — Telehealth: Payer: Self-pay | Admitting: *Deleted

## 2015-06-17 ENCOUNTER — Encounter (HOSPITAL_COMMUNITY)
Admission: RE | Admit: 2015-06-17 | Discharge: 2015-06-17 | Disposition: A | Discharge status: Home / Self Care | Payer: Medicare Other | Source: Ambulatory Visit | Attending: Oncology | Admitting: Oncology

## 2015-06-17 DIAGNOSIS — C7972 Secondary malignant neoplasm of left adrenal gland: Secondary | ICD-10-CM | POA: Insufficient documentation

## 2015-06-17 DIAGNOSIS — C7951 Secondary malignant neoplasm of bone: Secondary | ICD-10-CM | POA: Insufficient documentation

## 2015-06-17 DIAGNOSIS — C50912 Malignant neoplasm of unspecified site of left female breast: Secondary | ICD-10-CM | POA: Insufficient documentation

## 2015-06-17 DIAGNOSIS — I700 Atherosclerosis of aorta: Secondary | ICD-10-CM | POA: Insufficient documentation

## 2015-06-17 DIAGNOSIS — K8020 Calculus of gallbladder without cholecystitis without obstruction: Secondary | ICD-10-CM | POA: Diagnosis not present

## 2015-06-17 DIAGNOSIS — C50919 Malignant neoplasm of unspecified site of unspecified female breast: Secondary | ICD-10-CM

## 2015-06-17 DIAGNOSIS — C772 Secondary and unspecified malignant neoplasm of intra-abdominal lymph nodes: Secondary | ICD-10-CM | POA: Diagnosis not present

## 2015-06-17 DIAGNOSIS — I2510 Atherosclerotic heart disease of native coronary artery without angina pectoris: Secondary | ICD-10-CM | POA: Insufficient documentation

## 2015-06-17 DIAGNOSIS — E278 Other specified disorders of adrenal gland: Secondary | ICD-10-CM

## 2015-06-17 LAB — GLUCOSE, CAPILLARY: GLUCOSE-CAPILLARY: 87 mg/dL (ref 65–99)

## 2015-06-17 MED ORDER — FLUDEOXYGLUCOSE F - 18 (FDG) INJECTION
8.5400 | Freq: Once | INTRAVENOUS | Status: DC | PRN
  Administered 2015-06-17: 8.54 via INTRAVENOUS
  Filled 2015-06-17: qty 8.54

## 2015-06-17 NOTE — Telephone Encounter (Signed)
Number identifier, left message, follow-up  

## 2015-06-19 ENCOUNTER — Other Ambulatory Visit: Payer: Self-pay | Admitting: Oncology

## 2015-06-24 ENCOUNTER — Ambulatory Visit (HOSPITAL_BASED_OUTPATIENT_CLINIC_OR_DEPARTMENT_OTHER): Payer: Medicare Other | Admitting: Oncology

## 2015-06-24 ENCOUNTER — Telehealth: Payer: Self-pay | Admitting: Oncology

## 2015-06-24 VITALS — BP 119/61 | HR 60 | Temp 98.2°F | Resp 19 | Ht 64.0 in | Wt 174.9 lb

## 2015-06-24 DIAGNOSIS — C50812 Malignant neoplasm of overlapping sites of left female breast: Secondary | ICD-10-CM

## 2015-06-24 DIAGNOSIS — C7951 Secondary malignant neoplasm of bone: Secondary | ICD-10-CM

## 2015-06-24 DIAGNOSIS — C773 Secondary and unspecified malignant neoplasm of axilla and upper limb lymph nodes: Secondary | ICD-10-CM | POA: Diagnosis not present

## 2015-06-24 DIAGNOSIS — E278 Other specified disorders of adrenal gland: Secondary | ICD-10-CM

## 2015-06-24 DIAGNOSIS — C50919 Malignant neoplasm of unspecified site of unspecified female breast: Secondary | ICD-10-CM

## 2015-06-24 DIAGNOSIS — C7972 Secondary malignant neoplasm of left adrenal gland: Secondary | ICD-10-CM

## 2015-06-24 DIAGNOSIS — Z171 Estrogen receptor negative status [ER-]: Secondary | ICD-10-CM | POA: Diagnosis not present

## 2015-06-24 MED ORDER — TRAMADOL HCL 50 MG PO TABS: 50.0000 mg | ORAL_TABLET | Freq: Four times a day (QID) | ORAL | Status: DC | PRN

## 2015-06-24 MED ORDER — CAPECITABINE 500 MG PO TABS: 1500.0000 mg | ORAL_TABLET | Freq: Two times a day (BID) | ORAL | Status: DC

## 2015-06-24 MED ORDER — HYDROCODONE-IBUPROFEN 5-200 MG PO TABS: 1.0000 | ORAL_TABLET | Freq: Three times a day (TID) | ORAL | Status: DC | PRN

## 2015-06-24 NOTE — Telephone Encounter (Signed)
Appointments made and avs printed for patient °

## 2015-06-24 NOTE — Progress Notes (Signed)
. [name]   DOB: 1946-02-27  MR#: [id number]  ZDG#:[id number]  [name], PA-C GYN:  SU:  OTHER MD:    CHIEF COMPLAINT: left adrenal metastatsis  CURRENT TREATMENT: capecitabine, denosumab  BREAST CANCER HISTORY:  from the original intake note:  Charlett herself palpated a change in her left breast, shortly before her mammogram was due.  She brought this to [name] attention and he set her up for diagnostic mammography on October 31, 2009.   Routine and implant displaced views of both breasts showed a 2 cm irregular mass in the outer left breast, seen only on the spot tangential view.  The breast tissue itself was fatty.  There were no suspicious calcifications.  This mass was palpable to Dr. [name].  An ultrasound showed it to be 1.7 cm irregular and hypoechoic.  There were at least two enlarged left axillary lymph nodes identified.  Dr. [name] recommended biopsy which was performed the same day and showed ([id number]) and invasive breast cancer felt most likely to be ductal with lobular features.  The left axillary lymph node biopsy also showed the same tumor (similar morphologic findings) and both prognostic profiles showed the cancer to be strongly ER and PR positive with a low to borderline proliferation fraction (13 and 14%).  Neither mass showed amplification of Her-2 by CISH.  (The ratios were 1.1 and 0.85.)    With this information, the patient was referred to Dr. [name] and bilateral breast MRIs were obtained March 1st.  This showed several left axillary lymph nodes with cortical thickening, the largest one measuring 1.1 cm.  The mass itself measured 2.9 cm by MRI.  It was spiculated and irregular.  There was no evidence of contralateral disease.   She was treated neoadjuvantly with docetaxel and cyclophosphamide for 6 cycles completed in July 2011, after which she underwent left lumpectomy and axillary dissection August 2011 for a ypT1b yTN3 (14 positive lymph  nodes), Stage IIIC, grade 1 residual tumor. HER-2/neu was repeated, again not amplified. After completing local regional radiation October 2011 she started letrozole November 2011, continued to September 2016,which she was found to have metastatic disease   METASTATIC DISEASE HISTORY: From the 06/08/2015 summary:   Roneisha returns today for a new problem accompanied by her sister [name]. To summarize the recent history: She had a hemorrhagic stroke in January 2016 with some residuals. She has been living next door to her son in the [place] area since then.--In August she presented to her primary care physician, Dr. Thornton Papas, with a complaint of abdominal discomfort, nausea and vomiting. He treated her for a UTI w/o resolution and obtained a KUB in his office; this was nondiagnostic. Accordingly he set up the patient for CT scan of the abdomen and pelvis 05/06/2015. This showed the left adrenal gland to be enlarged to 7.5 x 4.0 cm. There was eccentric wall thickening of the gastric cardia. There were small lymph nodes in the upper abdomen and retroperitoneum. There was no liver involvement. The right adrenal gland was unremarkable. There was severe left kidney atrophy (congenital).  The patient was then set up for biopsy of the left adrenal mass on 05/30/2015. This showed [name] [id number]) metastatic carcinoma which was cytokeratin 7 positive, with rare cytokeratin 20 positivity and was negative for gross cystic disease fluid protein. The prognostic panel showed this tumor to be estrogen and progesterone receptor negative, HER-2 negative, with an MIB-1 of 90-100%.  Her subsequent history is as detailed below  INTERVAL HISTORY: Audyn returns today with her sister for review of her recent developments. She had a PET scan10/03/2015 which showed in additional to the left adrenal mass, some regional lymph nodes and multiple bone lesions. There was no obvious primary tumor. She also had an EGD to evaluate a  possible gastric primary suggested by the yearly a CT scan of the chest. This was normal. We also obtained tumor markerswhich showed a normal CA 19,a mildly elevated CEA at 9.7,[name] is significantly elevated CA-27-29 at 125.  Given this data, the most likely interpretation is that we are dealing with an estrogen receptor negative recurrence of her earlier estrogen receptor positive breast cancer, now stage IV  REVIEW OF SYSTEMS: Jinna still feels significantly tired. She is having insomnia problems. She describes her back and joint pain as throbbing aching and cramping. It is very intermittent. She short of breath particularly with moderate activity or going up stairs. She sleeps on 4 pillows. She keeps a dry cough. She has heartburn. Her appetite is poor but she is gained 3 pounds. She has occasional cramping abdominal pain. Again this is very intermittent. She feels weak, anxious, and depressed. A detailed review of systems was otherwise stable  PAST MEDICAL HISTORY: Past Medical History  Diagnosis Date  . Hypertension   . Breast cancer ([place]) 2011    Stage 3  . Atrial fibrillation ([place])   . Hypercholesterolemia   . Abnormal ECG   . Allergy     seasonal  . Arthritis   . GERD (gastroesophageal reflux disease)   . 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 Seizures ([place])   . Stroke ([place])     dec 15  . Thyroid disease    1. Significant for pituitary adenoma which was removed in 1983 but recurred, requiring radiation and briefly some Parlodel.  She has been off treatment since 1985.  She has some acromegaly secondary to this tumor.   2. She is status post bilateral submuscular breast implants under Dr. Towanda Malkin in 1987.   3. She is status post C-section for her third child who was hydrocephalic.   4. She underwent rhinoplasty in 1984.   5. She has a history of hypertension. 6. History of mitral valve prolapse, but she says she has not been receiving antibiotics prior to  dental or similar procedures.   7. History of hypothyroidism.   8. History of congenital absence of one kidney.   9. History of hyperlipidemia.  10. History of obesity.   11. History of renal stones.   12. History of fatty liver.   13. History of gout.   14. History of palpitations.   History of childhood asthma  PAST SURGICAL HISTORY: Past Surgical History  Procedure Laterality Date  . Cesarean section  1980  . Breast enhancement surgery  1987  . Umbilical hernia repair    . Tubal ligation    . Scar revision    . Pituitary surgery      adenoma  . Rhinoplasty  1985  . Breast lumpectomy Left   . Colonoscopy      FAMILY HISTORY Family History  Problem Relation Age of Onset  . Heart disease Mother   . Heart attack Father   . Heart attack Brother   . Colon cancer Neg Hx    The patient's father died from a myocardial infarction at the age of 63.  The patient's mother died from complications of congestive heart failure  at the age of 91.  The patient had one brother who died from a myocardial infarction at the age of 73.  One of the brothers and two sisters are alive and well.  Sr. [name] is an ICU nurse. There is no history of breast or ovarian cancer in the family to her knowledge.  GYNECOLOGIC HISTORY: She is GX P3, first pregnancy to term at age 5.  She went through the change of life at the time of her pituitary surgery in 1983.  She took hormone replacement for less than a year because of poor tolerance.    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  She gave up her job in 08/23/2010.  Her first husband died from chronic myeloid leukemia in 1985.  Her three children from that marriage include her son Randall Hiss who died from complications of hydrocephaly at age 47; son [name] who is a Insurance underwriter and son [name] who is an Public house manager in this area.  The patient has five grandchildren.  Her second husband of  20+ years, Ron, is a Company secretary of an independent [employer] and also does Writer and is a Oceanographer. He is now disabled due toa traumatic brain injury. He lost his first wife to endometrial cancer.  He has a daughter from that marriage.  She lives in [place]: not in place  HEALTH MAINTENANCE: Social History  Substance Use Topics  . Smoking status: Never Smoker   . Smokeless tobacco: Never Used  . Alcohol Use: No     Colonoscopy:  PAP:  Bone density: January 2012/ osteopenia  Lipid panel:  Allergies  Allergen Reactions  . Ciprofloxacin Other (See Comments)  . Clonidine Derivatives Other (See Comments)  . Phenytoin     Other reaction(s): Other Elevated LFTs  . Sulfa Antibiotics Hives  . Benzonatate     REACTION: Swelling, tessalon perles  . Codeine     REACTION: Nausea    Current Outpatient Prescriptions  Medication Sig Dispense Refill  . ALPRAZolam (XANAX) 0.25 MG tablet Take 0.25 mg by mouth.    . Ascorbic Acid (VITAMIN C) 100 MG tablet Take 1,000 mg by mouth.    Marland Kitchen aspirin (ASPIRIN CHILDRENS) 81 MG chewable tablet Chew 1 tablet (81 mg total) by mouth daily. 100 tablet 4  . atorvastatin (LIPITOR) 40 MG tablet Take 1 tablet (40 mg total) by mouth daily. 90 tablet 4  . DANDELION PO Take by mouth.    . famotidine (PEPCID) 20 MG tablet Take 1 tablet (20 mg total) by mouth 2 (two) times daily. 180 tablet 4  . hydrALAZINE (APRESOLINE) 10 MG tablet Take 1 tablet (10 mg total) by mouth 4 (four) times a day as needed (For SBP > 200 or DBP > 110).    . hydrochlorothiazide (HYDRODIURIL) 25 MG tablet Take 1 tablet (25 mg total) by mouth daily.    Marland Kitchen HYDROcodone-acetaminophen (NORCO/VICODIN) 5-325 MG tablet Take 1 tablet by mouth.    . Lacosamide (VIMPAT) 100 MG TABS Take 1 tablet (100 mg total) by mouth 2 (two) times daily. 60 tablet 3  . letrozole (FEMARA) 2.5 MG tablet Take 1 tablet (2.5 mg total) by mouth daily. 90 tablet 4  . levothyroxine (SYNTHROID,  LEVOTHROID) 100 MCG tablet Take 88 mcg by mouth daily before breakfast.    . losartan (COZAAR) 50 MG tablet Take 50 mg by mouth daily.    Marland Kitchen  metoprolol (LOPRESSOR) 50 MG tablet Take 50 mg by mouth 2 (two) times daily.     No current facility-administered medications for this visit.    OBJECTIVE: Middle-aged white woman who appears stated age  89 Vitals:   06/24/15 1113  BP: 119/61  Pulse: 60  Temp: 98.2 F (36.8 C)  Resp: 19     Body mass index is 30.01 kg/(m^2).    ECOG FS: 1  Sclerae unicteric, pupils round and equal Oropharynx clear and moist-- dentition in good repair No cervical or supraclavicular adenopathy Lungs no rales or rhonchi Heart regular rate and rhythm Abd soft, nontender, positive bowel sounds, no masses palpated MSK no focal spinal tenderness, no upper extremity lymphedema Neuro: nonfocal, well oriented, appropriate affect Breasts: deferred    LAB RESULTS: Lab Results  Component Value Date   WBC 6.2 06/08/2015   NEUTROABS 3.7 06/08/2015   HGB 12.9 06/08/2015   HCT 36.0 06/08/2015   MCV 95.5 06/08/2015   PLT 181 06/08/2015      Chemistry      Component Value Date/Time   NA 135* 06/08/2015 1611   NA 138 05/14/2013 1031   K 3.7 06/08/2015 1611   K 4.5 05/14/2013 1031   CL 101 05/14/2013 1031   CO2 27 06/08/2015 1611   CO2 32 05/14/2013 1031   BUN 17.8 06/08/2015 1611   BUN 18 05/14/2013 1031   CREATININE 0.8 06/08/2015 1611   CREATININE 0.9 05/14/2013 1031      Component Value Date/Time   CALCIUM 9.1 06/08/2015 1611   CALCIUM 9.5 05/14/2013 1031   ALKPHOS 156* 06/08/2015 1611   ALKPHOS 120* 05/14/2013 1031   AST 33 06/08/2015 1611   AST 93* 05/14/2013 1031   ALT 24 06/08/2015 1611   ALT 110* 05/14/2013 1031   BILITOT 0.79 06/08/2015 1611   BILITOT 1.3* 05/14/2013 1031      STUDIES: Nm Pet Image Initial (pi) Skull Base To Thigh  06/17/2015  CLINICAL DATA:  Initial treatment strategy for breast cancer. EXAM: NUCLEAR MEDICINE PET  SKULL BASE TO THIGH TECHNIQUE: 8.54 mCi F-18 FDG was injected intravenously. Full-ring PET imaging was performed from the skull base to thigh after the radiotracer. CT data was obtained and used for attenuation correction and anatomic localization. FASTING BLOOD GLUCOSE:  Value: 87 mg/dl COMPARISON:  None. FINDINGS: NECK No hypermetabolic lymph nodes in the neck. CHEST The heart size is normal. There is no pericardial effusion. Aortic atherosclerosis noted. Calcification involving the LAD, RCA and left circumflex coronary artery noted. The trachea is patent and is midline. Normal appearance of the esophagus. Right paratracheal lymph node has an SUV max equal to 3.9. Bilateral breast implants noted. Left axillary lymph node dissection has been performed. No supraclavicular or axillary adenopathy. 6 mm nodule is identified within the posterior left lung base. This has an SUV max equal to 1.8. ABDOMEN/PELVIS No focal areas of increased uptake within the liver. Gallstones noted. The pancreas is unremarkable. Normal appearance of the spleen. The right adrenal gland is negative. There is a left adrenal mass which measures 5 x 2.3 cm and has an SUV max equal to 28.39. Adjacent perianal or Dick adenopathy is identified. Index node measures 2.9 cm and has an SUV max equal to 21.9. Extensive hypermetabolic SKELETON Multifocal hypermetabolic bone metastases identified. Index lesion involving the L2 vertebra has an SUV max equal to 13.6. On the corresponding CT images this appears sclerotic. T9 lesion has an SUV max equal to 13.7. Hypermetabolic lesion involving  the costovertebral junction on the right at the T2 level has an SUV max equal to 9.8. Multiple small hypermetabolic foci are identified within the ventral abdominal wall. Nonspecific but are favored to room reflect injection granulomas. Subcutaneous metastasis not excluded. IMPRESSION: 1. Multifocal hypermetabolic bone metastases. 2. Periaortic metastatic adenopathy  within the upper abdomen 3. Left adrenal gland metastasis. 4. Aortic atherosclerosis. Electronically Signed   By: Kerby Moors M.D.   On: 06/17/2015 10:07     Gayla Doss MD   PATHOLOGIST   (SUPPLEMENTAL REPORT SIGNED 06/01/2015)   (REPORTED: 06/02/2015 AT 11:59)       DIAGNOSIS     ADRENAL GLAND, LEFT, NEEDLE CORE BIOPSY:   -METASTATIC CARCINOMA.     NOTE:BY IMMUNOHISTOCHEMICAL STAINS, TUMOR CELLS ARE POSITIVE FOR   CYTOKERATIN 7, HAVE RARE POSITIVITY FOR CYTOKERATIN 20 AND ARE NEGATIVE   FOR GCDFP.     THE STAINING PATTERN IS NOT SPECIFIC TO SITE BUT ON MORPHOLOGIC GROUNDS   AND IN CONSIDERATION OF THE PATIENT'S HISTORY OF BREAST CARCINOMA, WOULD   BE COMPATIBLE WITH BREAST PRIMARY.     ADDITIONAL STUDIES INCLUDING ER, PR AND HER-2/NEU ARE IN PROGRESS AND   THE RESULTS WILL BE REPORTED IN A SEPARATE ADDENDUM.   (BRA)         BILAL R. AHMAD MD   PATHOLOGIST   (CASE SIGNED 06/01/2015)    SUPPLEMENTAL REPORT   METASTATIC CARCINOMA.     BREAST CANCER PROGNOSTIC PANEL         TEST NAME STAINING PERCENT PROGNOSTIC SIGNIFICANCE  INTENSITYPOSITIVE   (AVERAGE)(AVERAGE)  ERNONE 0%UNFAVORABLE  PRNONE 0%UNFAVORABLE  HER2/NEU0NOT AMPLIFIED, SENT    FOR FISH  KI-67 STRONG 90-100% HIGH, UNFAVORABLE  REFERENCE  RANGES  TEST NAME FAVORABLEBORDERLINEUNFAVORABLE  ER> OR = 1%N/A < 1%  PR> OR = 1%N/A < 1%  HER2/NEU0 - 1+ 2+3+  KI-67 < OR = 20%  N/A > 40%  NOTE: THE SPECIMEN INFORMALINBETWEEN 6 AND 72 HOURS;   FIXATIVE  PARAFFIN-EMBEDDEDSECTIONS. PERFORMED USING DAB   TEST  POLYMERHER2 CLONE SP3, CLONE SP1, PR CLONE 16,  DETECTION: ER   KI67 CLONE SP6.      Gayla Doss MD   PATHOLOGIST   (SUPPLEMENTAL REPORT SIGNED 06/01/2015)   (REPORTED: 06/02/2015 AT 11:59)     ASSESSMENT: 69 y.o. [place], [place] woman  (1) status post left breast biopsy in February of 2011 for an invasive lobular carcinoma measuring 2.9 cm by MRI, with a positive prechemotherapy axillary lymph node biopsy, strongly estrogen and progesterone receptor positive with no evidence of HER-2/neu amplification and an MIB-1 of 14%.   (2) received 6 cycles of docetaxel/ cyclophosphamide in the neoadjuvant setting, completed in mid July of 2011   (3) s/p left lumpectomy and axillary lymph node dissection in August of 2011 for a ypT1b yTN3 (14 positive lymph nodes), Stage IIIC, grade 1 residual tumor. HER-2/neu was repeated, again not amplified.   (4) Completed loco-regional radiation in late October of 2011   (5) on letrozole starting early November 2011, continued to October 2016, with progression  METASTATIC DISEASE: September 2016: Involving left adrenal gland, regional nodes and bones  (6) biopsy of a 7.5 cm left adrenal mass 05/30/2015 showed metastatic carcinoma, estrogen, progesterone, and HER-2 negative, with an MIB-1 of 90-100%; the tumor cells were cytokeratin 7 positive, cytokeratin 20 focally positive, gross cystic disease fluid protein negative  (a) the CA-27-29 is informative  (b) PET scan 06/17/2015 shows, in addition to the left adrenal  mass, some periaortic adenopathy and multiple hypermetabolic bone metastases (L2, T9, T2 and others)  (c) EGD on  06/16/2015 to evaluate suspicious cardiac wall thickening showed no evidence of malignancy  (7) capecitabine to start 07/04/2015 at 1.5 g BID 7/7  (8) denosumab/ Xgeva to start 07/18/2015, repeated monthly   OTHER PROBLEMS: (a) the patient is status post hemorrhagic CVA January 2016  (b) congenital absence of left kidney   PLAN:  I spent approximately one hour with Anderson [place] and her sister, who is a Marine scientist, going over the results of the PET scan and the prior biopsy. The fact that the CA-27-29 is elevated is consistent with a breast primary. The working diagnosis then is recurrent metastatic breast cancer, which is now triple negative where as before it was estrogen receptor positive.the PET scan does not suggest any other primary and EGD did not show gastric cancer, which was a consideration based on the earlier CT scan  We reviewed the fact that breast cancer, stage IV, or really any adenocarcinoma stage IV is not curable with her current knowledge base.the goal of treatment accordingly is control. The general strategy is to do the least necessary to keep the cancer under control. This should allow the patient to optimize quality of life despite having what is essentially chronic ailment  We then reviewed the 3 questions we ask in these cases. The first is whether to treat or not. In her case she has is sufficiently good functional status despite her other problems to undergo treatment. She is also the primary caregiver for her husband. Accordingly she does desire treatment.  The second question is whether we should go for very aggressive or less aggressive treatment. Since we do not have involvement of the liver or lungs, or another works we do not have an immediately life-threatening problem, I think being less aggressive makes sense.  The third question is which agents to use and in this case were going to go with capecitabine. We discussed the possible toxicities, side effects and  complications of this agent. I'm going to try to obtain it for her so she can start October 24. She will need to see [place] every 2 weeks, at the start of each cycle, when we will assess tolerance as well as repeat labs. She will be restaged after 3 cycles (9 weeks) of treatment  She has a good understanding of the possible toxicities, side effects and complications of this agent and wishes to proceed.  We also discussed Denosumab, which we will start on November 7. She tells me her teeth are "fine" and she has had no recent extractions. She was instructed to take Tums on the day of Denosumab and the following to prevent hypocalcemia. She understands she may have some bony aches and pains for one or 2 days after each dose.  As far as her pain is concerned she is going to use hydrocodone with APAP or tramadol, both of which I wrote for her today.  Raeana will call with any problems that may develop before her next visit here, which will be November 7.  [name], MD 11:39 AM  06/24/2015   Oncology/hematology

## 2015-06-27 ENCOUNTER — Telehealth: Payer: Self-pay | Admitting: *Deleted

## 2015-06-27 ENCOUNTER — Encounter: Payer: Self-pay | Admitting: Oncology

## 2015-06-27 NOTE — Progress Notes (Signed)
Returned pt's phone call about financial assistance. Advised pt I would try to find out who may have called her. Pt  States someone called on Friday and her sister took the message. I was on [place] on Friday. I researched and didn't see any notes in reference to this. I contacted pt back to obtain financial assistance information such as income verification and household size. Pt didn't answer so I left voicemail for her to return my call.

## 2015-06-27 NOTE — Telephone Encounter (Signed)
Received call from patient stating that she was returning a call about financial assistance for her [employer]el[employer]. Left voicemail on [place] phone.

## 2015-07-05 ENCOUNTER — Other Ambulatory Visit: Payer: Self-pay | Admitting: *Deleted

## 2015-07-05 MED ORDER — ONDANSETRON 8 MG PO TBDP: 8.0000 mg | ORAL_TABLET | Freq: Two times a day (BID) | ORAL | Status: DC | PRN

## 2015-07-05 MED ORDER — HYDROCODONE-ACETAMINOPHEN 5-325 MG PO TABS: 1.0000 | ORAL_TABLET | Freq: Four times a day (QID) | ORAL | Status: DC | PRN

## 2015-07-06 ENCOUNTER — Other Ambulatory Visit: Payer: Self-pay | Admitting: *Deleted

## 2015-07-06 MED ORDER — HYDROCODONE-ACETAMINOPHEN 5-325 MG PO TABS: 1.0000 | ORAL_TABLET | Freq: Four times a day (QID) | ORAL | Status: DC | PRN

## 2015-07-12 ENCOUNTER — Ambulatory Visit: Payer: Medicare Other | Admitting: Internal Medicine

## 2015-07-18 ENCOUNTER — Ambulatory Visit (HOSPITAL_BASED_OUTPATIENT_CLINIC_OR_DEPARTMENT_OTHER): Payer: Medicare Other

## 2015-07-18 ENCOUNTER — Other Ambulatory Visit (HOSPITAL_BASED_OUTPATIENT_CLINIC_OR_DEPARTMENT_OTHER): Payer: Medicare Other

## 2015-07-18 ENCOUNTER — Other Ambulatory Visit: Payer: Self-pay | Admitting: Oncology

## 2015-07-18 ENCOUNTER — Ambulatory Visit (HOSPITAL_BASED_OUTPATIENT_CLINIC_OR_DEPARTMENT_OTHER): Payer: Medicare Other | Admitting: Physician Assistant

## 2015-07-18 ENCOUNTER — Encounter: Payer: Self-pay | Admitting: Physician Assistant

## 2015-07-18 VITALS — BP 148/57 | HR 60 | Temp 99.0°F | Resp 19 | Ht 64.0 in | Wt 173.2 lb

## 2015-07-18 DIAGNOSIS — C50919 Malignant neoplasm of unspecified site of unspecified female breast: Secondary | ICD-10-CM

## 2015-07-18 DIAGNOSIS — C7951 Secondary malignant neoplasm of bone: Secondary | ICD-10-CM

## 2015-07-18 DIAGNOSIS — C50912 Malignant neoplasm of unspecified site of left female breast: Secondary | ICD-10-CM

## 2015-07-18 DIAGNOSIS — C50812 Malignant neoplasm of overlapping sites of left female breast: Secondary | ICD-10-CM

## 2015-07-18 DIAGNOSIS — C7972 Secondary malignant neoplasm of left adrenal gland: Secondary | ICD-10-CM | POA: Diagnosis not present

## 2015-07-18 DIAGNOSIS — C773 Secondary and unspecified malignant neoplasm of axilla and upper limb lymph nodes: Secondary | ICD-10-CM

## 2015-07-18 DIAGNOSIS — E278 Other specified disorders of adrenal gland: Secondary | ICD-10-CM

## 2015-07-18 DIAGNOSIS — Z8673 Personal history of transient ischemic attack (TIA), and cerebral infarction without residual deficits: Secondary | ICD-10-CM

## 2015-07-18 DIAGNOSIS — Z79890 Hormone replacement therapy (postmenopausal): Secondary | ICD-10-CM

## 2015-07-18 LAB — COMPREHENSIVE METABOLIC PANEL (CC13)
ALT: 39 U/L (ref 0–55)
ANION GAP: 8 meq/L (ref 3–11)
AST: 36 U/L — AB (ref 5–34)
Albumin: 3.7 g/dL (ref 3.5–5.0)
Alkaline Phosphatase: 154 U/L — ABNORMAL HIGH (ref 40–150)
BUN: 17.7 mg/dL (ref 7.0–26.0)
CHLORIDE: 98 meq/L (ref 98–109)
CO2: 28 meq/L (ref 22–29)
CREATININE: 0.8 mg/dL (ref 0.6–1.1)
Calcium: 9.8 mg/dL (ref 8.4–10.4)
EGFR: 75 mL/min/{1.73_m2} — ABNORMAL LOW (ref >90)
Glucose: 95 mg/dl (ref 70–140)
POTASSIUM: 4.4 meq/L (ref 3.5–5.1)
Sodium: 134 mEq/L — ABNORMAL LOW (ref 136–145)
Total Bilirubin: 0.84 mg/dL (ref 0.20–1.20)
Total Protein: 7.3 g/dL (ref 6.4–8.3)

## 2015-07-18 LAB — CBC WITH DIFFERENTIAL/PLATELET
BASO%: 0.8 % (ref 0.0–2.0)
Basophils Absolute: 0 10*3/uL (ref 0.0–0.1)
EOS ABS: 0.3 10*3/uL (ref 0.0–0.5)
EOS%: 4.7 % (ref 0.0–7.0)
HEMATOCRIT: 37.3 % (ref 34.8–46.6)
HEMOGLOBIN: 13.2 g/dL (ref 11.6–15.9)
LYMPH%: 20.3 % (ref 14.0–49.7)
MCH: 34.9 pg — ABNORMAL HIGH (ref 25.1–34.0)
MCHC: 35.3 g/dL (ref 31.5–36.0)
MCV: 98.8 fL (ref 79.5–101.0)
MONO#: 0.9 10*3/uL (ref 0.1–0.9)
MONO%: 15.1 % — ABNORMAL HIGH (ref 0.0–14.0)
NEUT%: 59.1 % (ref 38.4–76.8)
NEUTROS ABS: 3.4 10*3/uL (ref 1.5–6.5)
PLATELETS: 189 10*3/uL (ref 145–400)
RBC: 3.78 10*6/uL (ref 3.70–5.45)
RDW: 13 % (ref 11.2–14.5)
WBC: 5.7 10*3/uL (ref 3.9–10.3)
lymph#: 1.2 10*3/uL (ref 0.9–3.3)

## 2015-07-18 MED ORDER — DENOSUMAB 120 MG/1.7ML ~~LOC~~ SOLN
120.0000 mg | Freq: Once | SUBCUTANEOUS | Status: AC
  Administered 2015-07-18: 120 mg via SUBCUTANEOUS
  Filled 2015-07-18: qty 1.7

## 2015-07-18 NOTE — Progress Notes (Signed)
. ID: Maria Pruitt   DOB: [birth year]  MR#: [id number]  PPJ#:[id number]  Maria Pruitt GYN:  SU:  OTHER MD:    CHIEF COMPLAINT: left adrenal metastatsis  CURRENT TREATMENT: capecitabine, denosumab  BREAST CANCER HISTORY:  from the original intake note:  Maria Pruitt palpated a change in her left breast, shortly before her mammogram was due.  She brought this to [name] attention and he set her up for diagnostic mammography on October 31, 2009.   Routine and implant displaced views of both breasts showed a 2 cm irregular mass in the outer left breast, seen only on the spot tangential view.  The breast tissue itself was fatty.  There were no suspicious calcifications.  This mass was palpable to Dr. [name].  An ultrasound showed it to be 1.7 cm irregular and hypoechoic.  There were at least two enlarged left axillary lymph nodes identified.  Dr. [name] recommended biopsy which was performed the same day and showed ([id number]) and invasive breast cancer felt most likely to be ductal with lobular features.  The left axillary lymph node biopsy also showed the same tumor (similar morphologic findings) and both prognostic profiles showed the cancer to be strongly ER and PR positive with a low to borderline proliferation fraction (13 and 14%).  Neither mass showed amplification of Her-2 by CISH.  (The ratios were 1.1 and 0.85.)    With this information, the patient was referred to Dr. [name] and bilateral breast MRIs were obtained March 1st.  This showed several left axillary lymph nodes with cortical thickening, the largest one measuring 1.1 cm.  The mass itself measured 2.9 cm by MRI.  It was spiculated and irregular.  There was no evidence of contralateral disease.   She was treated neoadjuvantly with docetaxel and cyclophosphamide for 6 cycles completed in July 2011, after which she underwent left lumpectomy and axillary dissection August 2011 for a ypT1b yTN3 (14 positive lymph  nodes), Stage IIIC, grade 1 residual tumor. HER-2/neu was repeated, again not amplified. After completing local regional radiation October 2011 she started letrozole November 2011, continued to September 2016, which she was found to have metastatic disease   METASTATIC DISEASE HISTORY: From the 06/08/2015 summary:   Maria Pruitt returns today for a new problem accompanied by her sister Maria Pruitt. To summarize the recent history: She had a hemorrhagic stroke in January 2016 with some residuals. She has been living next door to her son in the [place] area since then.--In August she presented to her primary care physician, Dr. Thornton Papas, with a complaint of abdominal discomfort, nausea and vomiting. He treated her for a UTI w/o resolution and obtained a KUB in his office; this was nondiagnostic. Accordingly he set up the patient for CT scan of the abdomen and pelvis 05/06/2015. This showed the left adrenal gland to be enlarged to 7.5 x 4.0 cm. There was eccentric wall thickening of the gastric cardia. There were small lymph nodes in the upper abdomen and retroperitoneum. There was no liver involvement. The right adrenal gland was unremarkable. There was severe left kidney atrophy (congenital).  The patient was then set up for biopsy of the left adrenal mass on 05/30/2015. This showed [name] [id number]) metastatic carcinoma which was cytokeratin 7 positive, with rare cytokeratin 20 positivity and was negative for gross cystic disease fluid protein. The prognostic panel showed this tumor to be estrogen and progesterone receptor negative, HER-2 negative, with an MIB-1 of 90-100%.   PET scan10/03/2015 which showed in additional  to the left adrenal mass, some regional lymph nodes and multiple bone lesions. There was no obvious primary tumor. She also had an EGD to evaluate a possible gastric primary suggested by the yearly a CT scan of the chest. This was normal. We also obtained tumor markerswhich showed a normal CA 19,a  mildly elevated CEA at 9.7,Maria Pruitt is significantly elevated CA-27-29 at 125. Given this data, the most likely interpretation is that we are dealing with an estrogen receptor negative recurrence of her earlier estrogen receptor positive breast cancer, now stage IV  Her subsequent history is as detailed below  INTERVAL HISTORY: Maria Pruitt returns today prior to her scheduled cycle 1  of Denosumab therapy  REVIEW OF SYSTEMS: [name] reports being less tired. Denies insomnia. She describes her back and joint pain as throbbing aching, intermittent and cramping. She denies shortness of breath at rest, only  withmoderate activity or going up stairs. She sleeps on 4 pillows. She keeps a dry cough. She has intermittent heartburn. Her appetite is fair. She has occasional, intermittent cramping abdominal pain. She feels anxious, and depressed. A detailed review of systems was otherwise stable  PAST MEDICAL HISTORY: Past Medical History  Diagnosis Date  . Hypertension   . Breast cancer (HCC) 2011    Stage 3  . Atrial fibrillation (HCC)   . Hypercholesterolemia   . Abnormal ECG   . Allergy     seasonal  . Arthritis   . GERD (gastroesophageal reflux disease)   . 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 Seizures (HCC)   . Stroke (HCC)     dec 15  . Thyroid disease    1. Significant for pituitary adenoma which was removed in 1983 but recurred, requiring radiation and briefly some Parlodel.  She has been off treatment since 1985.  She has some acromegaly secondary to this tumor.   2. She is status post bilateral submuscular breast implants under Dr. Truesdale in 1987.   3. She is status post C-section for her third child who was hydrocephalic.   4. She underwent rhinoplasty in 1984.   5. She has a history of hypertension. 6. History of mitral valve prolapse, but she says she has not been receiving antibiotics prior to dental or similar procedures.   7. History of hypothyroidism.    8. History of congenital absence of one kidney.   9. History of hyperlipidemia.  10. History of obesity.   11. History of renal stones.   12. History of fatty liver.   13. History of gout.   14. History of palpitations.   History of childhood asthma  PAST SURGICAL HISTORY: Past Surgical History  Procedure Laterality Date  . Cesarean section  1980  . Breast enhancement surgery  1987  . Umbilical hernia repair    . Tubal ligation    . Scar revision    . Pituitary surgery      adenoma  . Rhinoplasty  1985  . Breast lumpectomy Left   . Colonoscopy      FAMILY HISTORY Family History  Problem Relation Age of Onset  . Heart disease Mother   . Heart attack Father   . Heart attack Brother   . Colon cancer Neg Hx    The patient's father died from a myocardial infarction at the age of 50.  The patient's mother died from complications of congestive heart failure at the age of 86.  The patient had one brother who died   from a myocardial infarction at the age of 36.  One of the brothers and two sisters are alive and well.  Sr. Joanne is an ICU nurse. There is no history of breast or ovarian cancer in the family to her knowledge.  GYNECOLOGIC HISTORY: She is GX P3, first pregnancy to term at age 22.  She went through the change of life at the time of her pituitary surgery in 1983.  She took hormone replacement for less than a year because of poor tolerance.    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  She gave up her job in November 2011.  Her first husband died from chronic myeloid leukemia in 1985.  Her three children from that marriage include her son Eric who died from complications of hydrocephaly at age 4; son David who is a pilot and son Brian who is an assistant pastor in this area.  The patient has five grandchildren.  Her second husband of 20+ years, Ron, is a minister of an independent [employer] church and  also does portraits and is a boat builder. He is now disabled due toa traumatic brain injury. He lost his first wife to endometrial cancer.  He has a daughter from that marriage.  She lives in [place]   ADVANCED DIRECTIVES: not in place  HEALTH MAINTENANCE: Social History  Substance Use Topics  . Smoking status: Never Smoker   . Smokeless tobacco: Never Used  . Alcohol Use: No     Colonoscopy:  PAP:  Bone density: January 2012/ osteopenia  Lipid panel:  Allergies  Allergen Reactions  . Ciprofloxacin Other (See Comments)  . Clonidine Derivatives Other (See Comments)  . Phenytoin     Other reaction(s): Other Elevated LFTs  . Sulfa Antibiotics Hives  . Benzonatate     REACTION: Swelling, tessalon perles  . Codeine     REACTION: Nausea    Current Outpatient Prescriptions  Medication Sig Dispense Refill  . ALPRAZolam (XANAX) 0.25 MG tablet Take 0.25 mg by mouth.    . Ascorbic Acid (VITAMIN C) 100 MG tablet Take 1,000 mg by mouth.    . aspirin (ASPIRIN CHILDRENS) 81 MG chewable tablet Chew 1 tablet (81 mg total) by mouth daily. 100 tablet 4  . atorvastatin (LIPITOR) 40 MG tablet Take 1 tablet (40 mg total) by mouth daily. 90 tablet 4  . capecitabine (XELODA) 500 MG tablet Take 3 tablets (1,500 mg total) by mouth 2 (two) times daily after a meal. 42 tablet 6  . DANDELION PO Take by mouth.    . famotidine (PEPCID) 20 MG tablet Take 1 tablet (20 mg total) by mouth 2 (two) times daily. 180 tablet 4  . hydrALAZINE (APRESOLINE) 10 MG tablet Take 1 tablet (10 mg total) by mouth 4 (four) times a day as needed (For SBP > 200 or DBP > 110).    . hydrochlorothiazide (HYDRODIURIL) 25 MG tablet Take 1 tablet (25 mg total) by mouth daily.    . HYDROcodone-acetaminophen (NORCO/VICODIN) 5-325 MG tablet Take 1 tablet by mouth every 6 (six) hours as needed for moderate pain. 60 tablet 0  . Lacosamide (VIMPAT) 100 MG TABS Take 1 tablet (100 mg total) by mouth 2 (two) times daily. 60 tablet 3  .  levothyroxine (SYNTHROID, LEVOTHROID) 100 MCG tablet Take 88 mcg by mouth daily before breakfast.    . losartan (COZAAR) 50 MG tablet Take 50 mg by mouth daily.    .   metoprolol (LOPRESSOR) 50 MG tablet Take 50 mg by mouth 2 (two) times daily.    . ondansetron (ZOFRAN-ODT) 8 MG disintegrating tablet Take 1 tablet (8 mg total) by mouth 2 (two) times daily as needed for nausea or vomiting. 20 tablet 3  . traMADol (ULTRAM) 50 MG tablet Take 1 tablet (50 mg total) by mouth every 6 (six) hours as needed. 30 tablet 3   No current facility-administered medications for this visit.    OBJECTIVE: Middle-aged white woman who appears stated age  Filed Vitals:   07/18/15 1027  BP: 148/57  Pulse: 60  Temp: 99 F (37.2 C)  Resp: 19     Body mass index is 29.72 kg/(m^2).    ECOG FS: 1  Sclerae unicteric, pupils round and equal Oropharynx clear and moist-- dentition in good repair No cervical or supraclavicular adenopathy Lungs no rales or rhonchi Heart regular rate and rhythm Abd soft, nontender, positive bowel sounds, no masses palpated MSK no focal spinal tenderness, no upper extremity lymphedema Neuro: nonfocal, well oriented, appropriate affect Breasts: deferred    LAB RESULTS: CBC Latest Ref Rng 07/18/2015 06/08/2015 11/04/2014  WBC 3.9 - 10.3 10e3/uL 5.7 6.2 6.7  Hemoglobin 11.6 - 15.9 g/dL 13.2 12.9 14.8  Hematocrit 34.8 - 46.6 % 37.3 36.0 40.9  Platelets 145 - 400 10e3/uL 189 181 176     CMP Latest Ref Rng 07/18/2015 06/08/2015 11/04/2014  Glucose 70 - 140 mg/dl 95 95 101  BUN 7.0 - 26.0 mg/dL 17.7 17.8 14.9  Creatinine 0.6 - 1.1 mg/dL 0.8 0.8 0.8  Sodium 136 - 145 mEq/L 134(L) 135(L) 136  Potassium 3.5 - 5.1 mEq/L 4.4 3.7 4.3  Chloride 96 - 112 mEq/L - - -  CO2 22 - 29 mEq/L 28 27 25  Calcium 8.4 - 10.4 mg/dL 9.8 9.1 9.4  Total Protein 6.4 - 8.3 g/dL 7.3 7.1 7.7  Total Bilirubin 0.20 - 1.20 mg/dL 0.84 0.79 0.73  Alkaline Phos 40 - 150 U/L 154(H) 156(H) 197(H)  AST 5 - 34 U/L  36(H) 33 59(H)  ALT 0 - 55 U/L 39 24 50    STUDIES: No results found.   Maria S. NORMAN MD   PATHOLOGIST   (SUPPLEMENTAL REPORT SIGNED 06/01/2015)   (REPORTED: 06/02/2015 AT 11:59)       DIAGNOSIS     ADRENAL GLAND, LEFT, NEEDLE CORE BIOPSY:   -METASTATIC CARCINOMA.     NOTE:BY IMMUNOHISTOCHEMICAL STAINS, TUMOR CELLS ARE POSITIVE FOR   CYTOKERATIN 7, HAVE RARE POSITIVITY FOR CYTOKERATIN 20 AND ARE NEGATIVE   FOR GCDFP.     THE STAINING PATTERN IS NOT SPECIFIC TO SITE BUT ON MORPHOLOGIC GROUNDS   AND IN CONSIDERATION OF THE PATIENT'S HISTORY OF BREAST CARCINOMA, WOULD   BE COMPATIBLE WITH BREAST PRIMARY.     ADDITIONAL STUDIES INCLUDING ER, PR AND HER-2/NEU ARE IN PROGRESS AND   THE RESULTS WILL BE REPORTED IN A SEPARATE ADDENDUM.   (BRA)         BILAL R. AHMAD MD   PATHOLOGIST   (CASE SIGNED 06/01/2015)    SUPPLEMENTAL REPORT   METASTATIC CARCINOMA.     BREAST CANCER PROGNOSTIC PANEL         TEST NAME STAINING PERCENT PROGNOSTIC SIGNIFICANCE  INTENSITYPOSITIVE   (AVERAGE)(AVERAGE)  ERNONE 0%UNFAVORABLE  PRNONE 0%UNFAVORABLE  HER2/NEU0NOT AMPLIFIED, SENT    FOR FISH  KI-67 STRONG 90-100% HIGH, UNFAVORABLE  REFERENCE  RANGES  TEST NAME FAVORABLEBORDERLINEUNFAVORABLE  ER> OR = 1%N/A < 1%  PR>   OR = 1%N/A < 1%  HER2/NEU0 - 1+ 2+3+  KI-67 < OR = 20% N/A > 40%  NOTE: THE SPECIMEN INFORMALINBETWEEN 6 AND 72 HOURS;    FIXATIVE  PARAFFIN-EMBEDDEDSECTIONS. PERFORMED USING DAB   TEST  POLYMERHER2 CLONE SP3, CLONE SP1, PR CLONE 16,  DETECTION: ER   KI67 CLONE SP6.      Gayla Doss MD   PATHOLOGIST   (SUPPLEMENTAL REPORT SIGNED 06/01/2015)   (REPORTED: 06/02/2015 AT 11:59)     ASSESSMENT: 69 y.o. Maria Pruitt, [place] woman  (1) status post left breast biopsy in February of 2011 for an invasive lobular carcinoma measuring 2.9 cm by MRI, with a positive prechemotherapy axillary lymph node biopsy, strongly estrogen and progesterone receptor positive with no evidence of HER-2/neu amplification and an MIB-1 of 14%.   (2) received 6 cycles of docetaxel/ cyclophosphamide in the neoadjuvant setting, completed in mid July of 2011   (3) s/p left lumpectomy and axillary lymph node dissection in August of 2011 for a ypT1b yTN3 (14 positive lymph nodes), Stage IIIC, grade 1 residual tumor. HER-2/neu was repeated, again not amplified.   (4) Completed loco-regional radiation in late October of 2011   (5) on letrozole starting early November 2011, continued to October 2016, with progression  METASTATIC DISEASE: September 2016: Involving left adrenal gland, regional nodes and bones  (6) biopsy of a 7.5 cm left adrenal mass 05/30/2015 showed metastatic carcinoma, estrogen, progesterone, and HER-2 negative, with an MIB-1 of 90-100%; the tumor cells were cytokeratin 7 positive, cytokeratin 20 focally positive, gross cystic disease fluid protein negative  (a) the CA-27-29 is informative  (b) PET scan 06/17/2015 shows, in addition to the left adrenal mass, some periaortic adenopathy and multiple hypermetabolic bone metastases (L2, T9, T2 and others)  (c) EGD on 06/16/2015 to evaluate suspicious cardiac wall thickening showed no evidence of  malignancy  (7) capecitabine to start 07/04/2015 at 1.5 g BID 7/7  (8) denosumab/ Xgeva to start 07/18/2015, repeated monthly   OTHER PROBLEMS: (a) the patient is status post hemorrhagic CVA January 2016  (b) congenital absence of left kidney   PLAN:  Recurrent metastatic breast cancer, which is now triple negative where as before it was estrogen receptor positive. We also discussed Delton See, which we will start today, on November 7.  We discussed the possible toxicities, side effects and complications of this agent. She will need to see [place] every 2 weeks, at the start of each cycle, when we will assess tolerance as well as repeat labs.  She will be restaged after 3 cycles (9 weeks) of treatment She wishes to proceed.  She was instructed to take Tums on the day of Denosumab and the following to prevent hypocalcemia. She understands she may have some bony aches and pains for one or 2 days after each dose.  As far as her pain is concerned she is going to use hydrocodone with APAP or tramadol written during her prior visit on 10/14. She apparently did not pick up her Vicodin prescription by mistake and she is going to go after her visit today  Annick will call with any problems that may develop before her next visit here, which will be November 7.  Rondel Jumbo, Pruitt 11:04 AM  07/18/2015  Oncology/hematology

## 2015-07-18 NOTE — Patient Instructions (Signed)
Denosumab injection  What is this medicine?  DENOSUMAB (den oh sue mab) slows bone breakdown. Prolia is used to treat osteoporosis in women after menopause and in men. Xgeva is used to prevent bone fractures and other bone problems caused by cancer bone metastases. Xgeva is also used to treat giant cell tumor of the bone.  This medicine may be used for other purposes; ask your health care provider or pharmacist if you have questions.  What should I tell my health care provider before I take this medicine?  They need to know if you have any of these conditions:  -dental disease  -eczema  -infection or history of infections  -kidney disease or on dialysis  -low blood calcium or vitamin D  -malabsorption syndrome  -scheduled to have surgery or tooth extraction  -taking medicine that contains denosumab  -thyroid or parathyroid disease  -an unusual reaction to denosumab, other medicines, foods, dyes, or preservatives  -pregnant or trying to get pregnant  -breast-feeding  How should I use this medicine?  This medicine is for injection under the skin. It is given by a health care professional in a hospital or clinic setting.  If you are getting Prolia, a special MedGuide will be given to you by the pharmacist with each prescription and refill. Be sure to read this information carefully each time.  For Prolia, talk to your pediatrician regarding the use of this medicine in children. Special care may be needed. For Xgeva, talk to your pediatrician regarding the use of this medicine in children. While this drug may be prescribed for children as young as 13 years for selected conditions, precautions do apply.  Overdosage: If you think you have taken too much of this medicine contact a poison control center or emergency room at once.  NOTE: This medicine is only for you. Do not share this medicine with others.  What if I miss a dose?  It is important not to miss your dose. Call your doctor or health care professional if you are  unable to keep an appointment.  What may interact with this medicine?  Do not take this medicine with any of the following medications:  -other medicines containing denosumab  This medicine may also interact with the following medications:  -medicines that suppress the immune system  -medicines that treat cancer  -steroid medicines like prednisone or cortisone  This list may not describe all possible interactions. Give your health care provider a list of all the medicines, herbs, non-prescription drugs, or dietary supplements you use. Also tell them if you smoke, drink alcohol, or use illegal drugs. Some items may interact with your medicine.  What should I watch for while using this medicine?  Visit your doctor or health care professional for regular checks on your progress. Your doctor or health care professional may order blood tests and other tests to see how you are doing.  Call your doctor or health care professional if you get a cold or other infection while receiving this medicine. Do not treat yourself. This medicine may decrease your body's ability to fight infection.  You should make sure you get enough calcium and vitamin D while you are taking this medicine, unless your doctor tells you not to. Discuss the foods you eat and the vitamins you take with your health care professional.  See your dentist regularly. Brush and floss your teeth as directed. Before you have any dental work done, tell your dentist you are receiving this medicine.  Do   not become pregnant while taking this medicine or for 5 months after stopping it. Women should inform their doctor if they wish to become pregnant or think they might be pregnant. There is a potential for serious side effects to an unborn child. Talk to your health care professional or pharmacist for more information.  What side effects may I notice from receiving this medicine?  Side effects that you should report to your doctor or health care professional as soon as  possible:  -allergic reactions like skin rash, itching or hives, swelling of the face, lips, or tongue  -breathing problems  -chest pain  -fast, irregular heartbeat  -feeling faint or lightheaded, falls  -fever, chills, or any other sign of infection  -muscle spasms, tightening, or twitches  -numbness or tingling  -skin blisters or bumps, or is dry, peels, or red  -slow healing or unexplained pain in the mouth or jaw  -unusual bleeding or bruising  Side effects that usually do not require medical attention (Report these to your doctor or health care professional if they continue or are bothersome.):  -muscle pain  -stomach upset, gas  This list may not describe all possible side effects. Call your doctor for medical advice about side effects. You may report side effects to FDA at 1-800-FDA-1088.  Where should I keep my medicine?  This medicine is only given in a clinic, doctor's office, or other health care setting and will not be stored at home.  NOTE: This sheet is a summary. It may not cover all possible information. If you have questions about this medicine, talk to your doctor, pharmacist, or health care provider.      2016, Elsevier/Gold Standard. (2012-02-25 12:37:47)

## 2015-07-18 NOTE — Progress Notes (Signed)
Patient's daughter will either pick up patient's prescription today before 4:30 or first thing in the morning.  Thayer Headings, RN aware.

## 2015-07-21 ENCOUNTER — Other Ambulatory Visit: Payer: Medicare Other

## 2015-07-22 ENCOUNTER — Telehealth: Payer: Self-pay | Admitting: Pharmacist

## 2015-07-22 NOTE — Telephone Encounter (Signed)
11/11: attempted to call patient for follow up on xeloda. No answer. Left VM requesting patient to call back ([phone]).   Thank you,  Montel Clock, PharmD, [facility]

## 2015-07-28 ENCOUNTER — Telehealth: Payer: Self-pay | Admitting: Nurse Practitioner

## 2015-07-28 ENCOUNTER — Ambulatory Visit (HOSPITAL_BASED_OUTPATIENT_CLINIC_OR_DEPARTMENT_OTHER): Payer: Medicare Other | Admitting: Oncology

## 2015-07-28 VITALS — BP 108/53 | HR 58 | Temp 98.3°F | Resp 18 | Ht 64.0 in | Wt 173.7 lb

## 2015-07-28 DIAGNOSIS — C7951 Secondary malignant neoplasm of bone: Secondary | ICD-10-CM | POA: Diagnosis not present

## 2015-07-28 DIAGNOSIS — C50412 Malignant neoplasm of upper-outer quadrant of left female breast: Secondary | ICD-10-CM

## 2015-07-28 DIAGNOSIS — C50912 Malignant neoplasm of unspecified site of left female breast: Secondary | ICD-10-CM

## 2015-07-28 DIAGNOSIS — C50812 Malignant neoplasm of overlapping sites of left female breast: Secondary | ICD-10-CM

## 2015-07-28 MED ORDER — CAPECITABINE 500 MG PO TABS: 1500.0000 mg | ORAL_TABLET | Freq: Two times a day (BID) | ORAL | Status: DC

## 2015-07-28 NOTE — Telephone Encounter (Signed)
Appointments made and avs printed for patient °

## 2015-07-28 NOTE — Progress Notes (Signed)
. Maria Pruitt   DOB: [birth year]  MR#: [id number]  LHT#:[id number]  Maria Nova, PA-C GYN:  SU:  OTHER MD:    CHIEF COMPLAINT: left adrenal metastatsis  CURRENT TREATMENT: capecitabine, denosumab  BREAST CANCER HISTORY:  from the original intake note:  Maria Pruitt herself palpated a change in her left breast, shortly before her mammogram was due.  She brought this to Maria Pruitt attention and he set her up for diagnostic mammography on October 31, 2009.   Routine and implant displaced views of both breasts showed a 2 cm irregular mass in the outer left breast, seen only on the spot tangential view.  The breast tissue itself was fatty.  There were no suspicious calcifications.  This mass was palpable to Dr. Melanee Pruitt.  An ultrasound showed it to be 1.7 cm irregular and hypoechoic.  There were at least two enlarged left axillary lymph nodes identified.  Dr. Melanee Pruitt recommended biopsy which was performed the same day and showed ([id number]) and invasive breast cancer felt most likely to be ductal with lobular features.  The left axillary lymph node biopsy also showed the same tumor (similar morphologic findings) and both prognostic profiles showed the cancer to be strongly ER and PR positive with a low to borderline proliferation fraction (13 and 14%).  Neither mass showed amplification of Her-2 by CISH.  (The ratios were 1.1 and 0.85.)    With this information, the patient was referred to Dr. Margot Pruitt and bilateral breast MRIs were obtained March 1st.  This showed several left axillary lymph nodes with cortical thickening, the largest one measuring 1.1 cm.  The mass itself measured 2.9 cm by MRI.  It was spiculated and irregular.  There was no evidence of contralateral disease.   She was treated neoadjuvantly with docetaxel and cyclophosphamide for 6 cycles completed in July 2011, after which she underwent left lumpectomy and axillary dissection August 2011 for a ypT1b yTN3 (14 positive lymph  nodes), Stage IIIC, grade 1 residual tumor. HER-2/neu was repeated, again not amplified. After completing local regional radiation October 2011 she started letrozole November 2011, continued to September 2016, which she was found to have metastatic disease   METASTATIC DISEASE HISTORY: From the 06/08/2015 summary:   Maria Pruitt returns today for a new problem accompanied by her sister Maria Pruitt. To summarize the recent history: She had a hemorrhagic stroke in January 2016 with some residuals. She has been living next door to her son in the [place] area since then.--In August she presented to her primary care physician, Dr. Thornton Pruitt, with a complaint of abdominal discomfort, nausea and vomiting. He treated her for a UTI w/o resolution and obtained a KUB in his office; this was nondiagnostic. Accordingly he set up the patient for CT scan of the abdomen and pelvis 05/06/2015. This showed the left adrenal gland to be enlarged to 7.5 x 4.0 cm. There was eccentric wall thickening of the gastric cardia. There were small lymph nodes in the upper abdomen and retroperitoneum. There was no liver involvement. The right adrenal gland was unremarkable. There was severe left kidney atrophy (congenital).  The patient was then set up for biopsy of the left adrenal mass on 05/30/2015. This showed Maria Pruitt [id number]) metastatic carcinoma which was cytokeratin 7 positive, with rare cytokeratin 20 positivity and was negative for gross cystic disease fluid protein. The prognostic panel showed this tumor to be estrogen and progesterone receptor negative, HER-2 negative, with an MIB-1 of 90-100%.   PET scan10/03/2015 which showed in additional  to the left adrenal mass, some regional lymph nodes and multiple bone lesions. There was no obvious primary tumor. She also had an EGD to evaluate a possible gastric primary suggested by the yearly a CT scan of the chest. This was normal. We also obtained tumor markerswhich showed a normal CA 19,a  mildly elevated CEA at 9.7,[name] is significantly elevated CA-27-29 at 125. Given this data, the most likely interpretation is that we are dealing with an estrogen receptor negative recurrence of her earlier estrogen receptor positive breast cancer, now stage IV  Her subsequent history is as detailed below  INTERVAL HISTORY: Maria Pruitt returns today for follow-up of her metastatic triple negative breast cancer, accompanied by her son Maria Pruitt. She is being treated with capecitabine, 1.5 g twice daily 7 days on and 7 days off. She is currently on the "off" week and is scheduled to restart 08/01/2015.  She is tolerating the capecitabine well. Specifically she has had no diarrhea, no mouth sores, and no palmar plantar erythrodysesthesia. She obtains the drug currently at no cost.. She also received her first dose of Denosumab this month. She had no side effects from that either.  REVIEW OF SYSTEMS: Maria Pruitt tells me the pain she was having in the left side and back is considerably diminished. She is not taking any pain medication for this. She is very encouraged by this. She does get leg cramps occasionally but they also are diminished. She feels forgetful but not depressed. She has stress urinary incontinence. She remains more concerned about her husband's situation than her up. A detailed review of systems today was otherwise noncontributory  PAST MEDICAL HISTORY: Past Medical History  Diagnosis Date  . Hypertension   . Breast cancer ([place]) 2011    Stage 3  . Atrial fibrillation ([place])   . Hypercholesterolemia   . Abnormal ECG   . Allergy     seasonal  . Arthritis   . GERD (gastroesophageal reflux disease)   . 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 Seizures ([place])   . Stroke ([place])     dec 15  . Thyroid disease    1. Significant for pituitary adenoma which was removed in 1983 but recurred, requiring radiation and briefly some Parlodel.  She has been off treatment since 1985.   She has some acromegaly secondary to this tumor.   2. She is status post bilateral submuscular breast implants under Dr. Towanda Malkin in 1987.   3. She is status post C-section for her third child who was hydrocephalic.   4. She underwent rhinoplasty in 1984.   5. She has a history of hypertension. 6. History of mitral valve prolapse, but she says she has not been receiving antibiotics prior to dental or similar procedures.   7. History of hypothyroidism.   8. History of congenital absence of one kidney.   9. History of hyperlipidemia.  10. History of obesity.   11. History of renal stones.   12. History of fatty liver.   13. History of gout.   14. History of palpitations.   History of childhood asthma  PAST SURGICAL HISTORY: Past Surgical History  Procedure Laterality Date  . Cesarean section  1980  . Breast enhancement surgery  1987  . Umbilical hernia repair    . Tubal ligation    . Scar revision    . Pituitary surgery      adenoma  . Rhinoplasty  1985  . Breast lumpectomy Left   .  Colonoscopy      FAMILY HISTORY Family History  Problem Relation Age of Onset  . Heart disease Mother   . Heart attack Father   . Heart attack Brother   . Colon cancer Neg Hx    The patient's father died from a myocardial infarction at the age of 67.  The patient's mother died from complications of congestive heart failure at the age of 90.  The patient had one brother who died from a myocardial infarction at the age of 86.  One of the brothers and two sisters are alive and well.  Sr. Maria Pruitt is an ICU nurse. There is no history of breast or ovarian cancer in the family to her knowledge.  GYNECOLOGIC HISTORY: She is GX P3, first pregnancy to term at age 39.  She went through the change of life at the time of her pituitary surgery in 1983.  She took hormone replacement for less than a year because of poor tolerance.    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  She gave up her job in August 12, 2010.  Her first husband died from chronic myeloid leukemia in 1985.  Her three children from that marriage include her son Randall Hiss who died from complications of hydrocephaly at age 51; son [name] who is a Insurance underwriter and son Maria Pruitt who is an Public house manager in this area.  The patient has five grandchildren.  Her second husband of 20+ years, Ron, is a Company secretary of an independent [employer] and also does Writer and is a Oceanographer. He is now disabled due toa traumatic brain injury. He lost his first wife to endometrial cancer.  He has a daughter from that marriage.  She lives in [place]: not in place  HEALTH MAINTENANCE: Social History  Substance Use Topics  . Smoking status: Never Smoker   . Smokeless tobacco: Never Used  . Alcohol Use: No     Colonoscopy:  PAP:  Bone density: January 2012/ osteopenia  Lipid panel:  Allergies  Allergen Reactions  . Ciprofloxacin Other (See Comments)  . Clonidine Derivatives Other (See Comments)  . Phenytoin     Other reaction(s): Other Elevated LFTs  . Sulfa Antibiotics Hives  . Benzonatate     REACTION: Swelling, tessalon perles  . Codeine     REACTION: Nausea    Current Outpatient Prescriptions  Medication Sig Dispense Refill  . ALPRAZolam (XANAX) 0.25 MG tablet Take 0.25 mg by mouth.    . Ascorbic Acid (VITAMIN C) 100 MG tablet Take 1,000 mg by mouth.    Marland Kitchen aspirin (ASPIRIN CHILDRENS) 81 MG chewable tablet Chew 1 tablet (81 mg total) by mouth daily. 100 tablet 4  . atorvastatin (LIPITOR) 40 MG tablet Take 1 tablet (40 mg total) by mouth daily. 90 tablet 4  . capecitabine (XELODA) 500 MG tablet Take 3 tablets (1,500 mg total) by mouth 2 (two) times daily after a meal. 42 tablet 6  . DANDELION PO Take by mouth.    . famotidine (PEPCID) 20 MG tablet Take 1 tablet (20 mg total) by mouth 2 (two) times daily. 180 tablet 4  . hydrALAZINE (APRESOLINE)  10 MG tablet Take 1 tablet (10 mg total) by mouth 4 (four) times a day as needed (For SBP > 200 or DBP > 110).    . hydrochlorothiazide (HYDRODIURIL) 25 MG tablet Take 1 tablet (25 mg total) by mouth  daily.    Marland Kitchen HYDROcodone-acetaminophen (NORCO/VICODIN) 5-325 MG tablet Take 1 tablet by mouth every 6 (six) hours as needed for moderate pain. 60 tablet 0  . Lacosamide (VIMPAT) 100 MG TABS Take 1 tablet (100 mg total) by mouth 2 (two) times daily. 60 tablet 3  . levothyroxine (SYNTHROID, LEVOTHROID) 100 MCG tablet Take 88 mcg by mouth daily before breakfast.    . losartan (COZAAR) 50 MG tablet Take 50 mg by mouth daily.    . metoprolol (LOPRESSOR) 50 MG tablet Take 50 mg by mouth 2 (two) times daily.    . ondansetron (ZOFRAN-ODT) 8 MG disintegrating tablet Take 1 tablet (8 mg total) by mouth 2 (two) times daily as needed for nausea or vomiting. 20 tablet 3  . traMADol (ULTRAM) 50 MG tablet Take 1 tablet (50 mg total) by mouth every 6 (six) hours as needed. 30 tablet 3   No current facility-administered medications for this visit.    OBJECTIVE: Middle-aged white woman in no acute distress Filed Vitals:   07/28/15 1507  BP: 108/53  Pulse: 58  Temp: 98.3 F (36.8 C)  Resp: 18     Body mass index is 29.8 kg/(m^2).    ECOG FS: 1  Sclerae unicteric, EOMs intact Oropharynx clear, no thrush or other lesions No cervical or supraclavicular adenopathy Lungs no rales or rhonchi Heart regular rate and rhythm Abd soft, nontender, positive bowel sounds MSK no focal spinal tenderness, no upper extremity lymphedema Neuro: nonfocal, well oriented, appropriate affect Breasts: The right breast is unremarkable. The left breast is status post lumpectomy and radiation. There is no evidence of local recurrence. The left axilla is benign.   LAB RESULTS: CBC Latest Ref Rng 07/18/2015 06/08/2015 11/04/2014  WBC 3.9 - 10.3 10e3/uL 5.7 6.2 6.7  Hemoglobin 11.6 - 15.9 g/dL 13.2 12.9 14.8  Hematocrit 34.8 - 46.6 %  37.3 36.0 40.9  Platelets 145 - 400 10e3/uL 189 181 176     CMP Latest Ref Rng 07/18/2015 06/08/2015 11/04/2014  Glucose 70 - 140 mg/dl 95 95 101  BUN 7.0 - 26.0 mg/dL 17.7 17.8 14.9  Creatinine 0.6 - 1.1 mg/dL 0.8 0.8 0.8  Sodium 136 - 145 mEq/L 134(L) 135(L) 136  Potassium 3.5 - 5.1 mEq/L 4.4 3.7 4.3  Chloride 96 - 112 mEq/L - - -  CO2 22 - 29 mEq/L 28 27 25   Calcium 8.4 - 10.4 mg/dL 9.8 9.1 9.4  Total Protein 6.4 - 8.3 g/dL 7.3 7.1 7.7  Total Bilirubin 0.20 - 1.20 mg/dL 0.84 0.79 0.73  Alkaline Phos 40 - 150 U/L 154(H) 156(H) 197(H)  AST 5 - 34 U/L 36(H) 33 59(H)  ALT 0 - 55 U/L 39 24 50    STUDIES: No results found.  ASSESSMENT: 69 y.o. [place], [place] woman  (1) status post left breast biopsy in February of 2011 for an invasive lobular carcinoma measuring 2.9 cm by MRI, with a positive prechemotherapy axillary lymph node biopsy, strongly estrogen and progesterone receptor positive with no evidence of HER-2/neu amplification and an MIB-1 of 14%.   (2) received 6 cycles of docetaxel/ cyclophosphamide in the neoadjuvant setting, completed in mid July of 2011   (3) s/p left lumpectomy and axillary lymph node dissection in August of 2011 for a ypT1b yTN3 (14 positive lymph nodes), Stage IIIC, grade 1 residual tumor. HER-2/neu was repeated, again not amplified.   (4) Completed loco-regional radiation in late October of 2011   (5) on letrozole starting early November 2011, continued  to October 2016, with progression  METASTATIC DISEASE: September 2016: Involving left adrenal gland, regional nodes and bones  (6) biopsy of a 7.5 cm left adrenal mass 05/30/2015 showed metastatic carcinoma, estrogen, progesterone, and HER-2 negative, with an MIB-1 of 90-100%; the tumor cells were cytokeratin 7 positive, cytokeratin 20 focally positive, gross cystic disease fluid protein negative  (a) the CA-27-29 is informative  (b) PET scan 06/17/2015 shows, in addition to the left adrenal  mass, some periaortic adenopathy and multiple hypermetabolic bone metastases (L2, T9, T2 and others)  (c) EGD on 06/16/2015 to evaluate suspicious cardiac wall thickening showed no evidence of malignancy  (7) capecitabine started 07/04/2015 at 1.5 g BID 7/7  (8) denosumab/ Xgeva started 07/18/2015, repeated monthly   OTHER PROBLEMS: (a) the patient is status post hemorrhagic CVA January 2016  (b) congenital absence of left kidney   PLAN:  Maria Pruitt is tolerating both the Denosumab and capecitabine treatments well and clinically she is already having a response, with decreased pain and improved functional status. Accordingly we are continuing as planned, with her next capecitabine cycle due to start 08/01/2015 and her next Denosumab dose 08/15/2015.  We're going to see her every 2 weeks to make sure she continues to tolerate treatment well and to check her counts before the start of each cycle. We will also be following the CA-27-29. I have set her up for repeat abdominal MRI mid December, to follow her measurable disease. If we can document at least disease control, we will probably repeat abdominal MRIs every 3-4 months for the next year. We will need a repeat bone density sometime early in 2017 since Denosumab can unmask sites of bony disease not seen on prior scans, and we need to make sure we do not miss take this for disease progression.  Maria Pruitt knows to call for any problems that may develop before the next visit here. Maria Cruel, MD 3:18 PM  07/28/2015

## 2015-08-11 ENCOUNTER — Telehealth: Payer: Self-pay | Admitting: Oncology

## 2015-08-11 NOTE — Telephone Encounter (Signed)
Patient left a message today re calling several times over the past few days and not getting a return call. I have returned patient calls multiple times and have not been able to reach her or leave a message. I called patients son who happens to live next door and took the phone to her as he also cannot reach her by phone. Gave patient new appointment for tomorrow at 11:15 am. Patient made aware that I returned her calls but was not able to reach her or leave a message.

## 2015-08-12 ENCOUNTER — Other Ambulatory Visit: Payer: Self-pay | Admitting: *Deleted

## 2015-08-12 ENCOUNTER — Telehealth: Payer: Self-pay | Admitting: Oncology

## 2015-08-12 ENCOUNTER — Ambulatory Visit (HOSPITAL_BASED_OUTPATIENT_CLINIC_OR_DEPARTMENT_OTHER): Payer: Medicare Other | Admitting: Nurse Practitioner

## 2015-08-12 ENCOUNTER — Other Ambulatory Visit (HOSPITAL_BASED_OUTPATIENT_CLINIC_OR_DEPARTMENT_OTHER): Payer: Medicare Other

## 2015-08-12 ENCOUNTER — Ambulatory Visit (HOSPITAL_BASED_OUTPATIENT_CLINIC_OR_DEPARTMENT_OTHER): Payer: Medicare Other

## 2015-08-12 ENCOUNTER — Encounter: Payer: Self-pay | Admitting: Nurse Practitioner

## 2015-08-12 VITALS — BP 138/78 | HR 54 | Temp 97.5°F | Resp 18 | Ht 64.0 in | Wt 173.2 lb

## 2015-08-12 DIAGNOSIS — C50412 Malignant neoplasm of upper-outer quadrant of left female breast: Secondary | ICD-10-CM

## 2015-08-12 DIAGNOSIS — Z171 Estrogen receptor negative status [ER-]: Secondary | ICD-10-CM

## 2015-08-12 DIAGNOSIS — Z905 Acquired absence of kidney: Secondary | ICD-10-CM

## 2015-08-12 DIAGNOSIS — R197 Diarrhea, unspecified: Secondary | ICD-10-CM

## 2015-08-12 DIAGNOSIS — C7972 Secondary malignant neoplasm of left adrenal gland: Secondary | ICD-10-CM

## 2015-08-12 DIAGNOSIS — C7951 Secondary malignant neoplasm of bone: Secondary | ICD-10-CM

## 2015-08-12 DIAGNOSIS — C50912 Malignant neoplasm of unspecified site of left female breast: Secondary | ICD-10-CM

## 2015-08-12 DIAGNOSIS — C773 Secondary and unspecified malignant neoplasm of axilla and upper limb lymph nodes: Secondary | ICD-10-CM

## 2015-08-12 DIAGNOSIS — G893 Neoplasm related pain (acute) (chronic): Secondary | ICD-10-CM

## 2015-08-12 DIAGNOSIS — Z8673 Personal history of transient ischemic attack (TIA), and cerebral infarction without residual deficits: Secondary | ICD-10-CM

## 2015-08-12 DIAGNOSIS — R682 Dry mouth, unspecified: Secondary | ICD-10-CM

## 2015-08-12 DIAGNOSIS — C50812 Malignant neoplasm of overlapping sites of left female breast: Secondary | ICD-10-CM

## 2015-08-12 LAB — COMPREHENSIVE METABOLIC PANEL
ALT: 25 U/L (ref 0–55)
AST: 38 U/L — AB (ref 5–34)
Albumin: 3.8 g/dL (ref 3.5–5.0)
Alkaline Phosphatase: 156 U/L — ABNORMAL HIGH (ref 40–150)
Anion Gap: 10 mEq/L (ref 3–11)
BUN: 19.6 mg/dL (ref 7.0–26.0)
CHLORIDE: 98 meq/L (ref 98–109)
CO2: 25 meq/L (ref 22–29)
CREATININE: 0.9 mg/dL (ref 0.6–1.1)
Calcium: 9 mg/dL (ref 8.4–10.4)
EGFR: 69 mL/min/{1.73_m2} — ABNORMAL LOW (ref >90)
GLUCOSE: 118 mg/dL (ref 70–140)
Potassium: 4 mEq/L (ref 3.5–5.1)
Sodium: 134 mEq/L — ABNORMAL LOW (ref 136–145)
TOTAL PROTEIN: 7.5 g/dL (ref 6.4–8.3)
Total Bilirubin: 1.11 mg/dL (ref 0.20–1.20)

## 2015-08-12 LAB — CBC WITH DIFFERENTIAL/PLATELET
BASO%: 1 % (ref 0.0–2.0)
Basophils Absolute: 0 10*3/uL (ref 0.0–0.1)
EOS ABS: 0.2 10*3/uL (ref 0.0–0.5)
EOS%: 3.9 % (ref 0.0–7.0)
HCT: 36.6 % (ref 34.8–46.6)
HGB: 12.7 g/dL (ref 11.6–15.9)
LYMPH#: 1 10*3/uL (ref 0.9–3.3)
LYMPH%: 20.4 % (ref 14.0–49.7)
MCH: 36.1 pg — ABNORMAL HIGH (ref 25.1–34.0)
MCHC: 34.6 g/dL (ref 31.5–36.0)
MCV: 104.4 fL — ABNORMAL HIGH (ref 79.5–101.0)
MONO#: 0.6 10*3/uL (ref 0.1–0.9)
MONO%: 12.3 % (ref 0.0–14.0)
NEUT%: 62.4 % (ref 38.4–76.8)
NEUTROS ABS: 3 10*3/uL (ref 1.5–6.5)
Platelets: 195 10*3/uL (ref 145–400)
RBC: 3.51 10*6/uL — AB (ref 3.70–5.45)
RDW: 14.5 % (ref 11.2–14.5)
WBC: 4.7 10*3/uL (ref 3.9–10.3)

## 2015-08-12 MED ORDER — CAPECITABINE 500 MG PO TABS
1500.0000 mg | ORAL_TABLET | Freq: Two times a day (BID) | ORAL | Status: DC
Start: 2015-08-12 — End: 2015-10-04

## 2015-08-12 MED ORDER — HYDROCODONE-ACETAMINOPHEN 5-325 MG PO TABS: 1.0000 | ORAL_TABLET | Freq: Four times a day (QID) | ORAL | Status: DC | PRN

## 2015-08-12 MED ORDER — DENOSUMAB 120 MG/1.7ML ~~LOC~~ SOLN
120.0000 mg | Freq: Once | SUBCUTANEOUS | Status: AC
  Administered 2015-08-12: 120 mg via SUBCUTANEOUS
  Filled 2015-08-12: qty 1.7

## 2015-08-12 NOTE — Patient Instructions (Signed)
Denosumab injection  What is this medicine?  DENOSUMAB (den oh sue mab) slows bone breakdown. Prolia is used to treat osteoporosis in women after menopause and in men. Xgeva is used to prevent bone fractures and other bone problems caused by cancer bone metastases. Xgeva is also used to treat giant cell tumor of the bone.  This medicine may be used for other purposes; ask your health care provider or pharmacist if you have questions.  What should I tell my health care provider before I take this medicine?  They need to know if you have any of these conditions:  -dental disease  -eczema  -infection or history of infections  -kidney disease or on dialysis  -low blood calcium or vitamin D  -malabsorption syndrome  -scheduled to have surgery or tooth extraction  -taking medicine that contains denosumab  -thyroid or parathyroid disease  -an unusual reaction to denosumab, other medicines, foods, dyes, or preservatives  -pregnant or trying to get pregnant  -breast-feeding  How should I use this medicine?  This medicine is for injection under the skin. It is given by a health care professional in a hospital or clinic setting.  If you are getting Prolia, a special MedGuide will be given to you by the pharmacist with each prescription and refill. Be sure to read this information carefully each time.  For Prolia, talk to your pediatrician regarding the use of this medicine in children. Special care may be needed. For Xgeva, talk to your pediatrician regarding the use of this medicine in children. While this drug may be prescribed for children as young as 13 years for selected conditions, precautions do apply.  Overdosage: If you think you have taken too much of this medicine contact a poison control center or emergency room at once.  NOTE: This medicine is only for you. Do not share this medicine with others.  What if I miss a dose?  It is important not to miss your dose. Call your doctor or health care professional if you are  unable to keep an appointment.  What may interact with this medicine?  Do not take this medicine with any of the following medications:  -other medicines containing denosumab  This medicine may also interact with the following medications:  -medicines that suppress the immune system  -medicines that treat cancer  -steroid medicines like prednisone or cortisone  This list may not describe all possible interactions. Give your health care provider a list of all the medicines, herbs, non-prescription drugs, or dietary supplements you use. Also tell them if you smoke, drink alcohol, or use illegal drugs. Some items may interact with your medicine.  What should I watch for while using this medicine?  Visit your doctor or health care professional for regular checks on your progress. Your doctor or health care professional may order blood tests and other tests to see how you are doing.  Call your doctor or health care professional if you get a cold or other infection while receiving this medicine. Do not treat yourself. This medicine may decrease your body's ability to fight infection.  You should make sure you get enough calcium and vitamin D while you are taking this medicine, unless your doctor tells you not to. Discuss the foods you eat and the vitamins you take with your health care professional.  See your dentist regularly. Brush and floss your teeth as directed. Before you have any dental work done, tell your dentist you are receiving this medicine.  Do   not become pregnant while taking this medicine or for 5 months after stopping it. Women should inform their doctor if they wish to become pregnant or think they might be pregnant. There is a potential for serious side effects to an unborn child. Talk to your health care professional or pharmacist for more information.  What side effects may I notice from receiving this medicine?  Side effects that you should report to your doctor or health care professional as soon as  possible:  -allergic reactions like skin rash, itching or hives, swelling of the face, lips, or tongue  -breathing problems  -chest pain  -fast, irregular heartbeat  -feeling faint or lightheaded, falls  -fever, chills, or any other sign of infection  -muscle spasms, tightening, or twitches  -numbness or tingling  -skin blisters or bumps, or is dry, peels, or red  -slow healing or unexplained pain in the mouth or jaw  -unusual bleeding or bruising  Side effects that usually do not require medical attention (Report these to your doctor or health care professional if they continue or are bothersome.):  -muscle pain  -stomach upset, gas  This list may not describe all possible side effects. Call your doctor for medical advice about side effects. You may report side effects to FDA at 1-800-FDA-1088.  Where should I keep my medicine?  This medicine is only given in a clinic, doctor's office, or other health care setting and will not be stored at home.  NOTE: This sheet is a summary. It may not cover all possible information. If you have questions about this medicine, talk to your doctor, pharmacist, or health care provider.      2016, Elsevier/Gold Standard. (2012-02-25 12:37:47)

## 2015-08-12 NOTE — Progress Notes (Signed)
. [name]   DOB: Nov 15, 1945  MR#: [id number]  PTW#:[id number]  Maria Nova, PA-C GYN:  SU:  OTHER MD:    CHIEF COMPLAINT: left adrenal metastatsis  CURRENT TREATMENT: capecitabine, denosumab  BREAST CANCER HISTORY:  from the original intake note:  Maria Pruitt palpated a change in her left breast, shortly before her mammogram was due.  Maria Pruitt brought this to [name] attention and he set her up for diagnostic mammography on October 31, 2009.   Routine and implant displaced views of both breasts showed a 2 cm irregular mass in the outer left breast, seen only on the spot tangential view.  The breast tissue itself was fatty.  There were no suspicious calcifications.  This mass was palpable to Dr. [name].  An ultrasound showed it to be 1.7 cm irregular and hypoechoic.  There were at least two enlarged left axillary lymph nodes identified.  Dr. [name] recommended biopsy which was performed the same day and showed ([id number]) and invasive breast cancer felt most likely to be ductal with lobular features.  The left axillary lymph node biopsy also showed the same tumor (similar morphologic findings) and both prognostic profiles showed the cancer to be strongly ER and PR positive with a low to borderline proliferation fraction (13 and 14%).  Neither mass showed amplification of Her-2 by CISH.  (The ratios were 1.1 and 0.85.)    With this information, the patient was referred to Dr. [name] and bilateral breast MRIs were obtained March 1st.  This showed several left axillary lymph nodes with cortical thickening, the largest one measuring 1.1 cm.  The mass itself measured 2.9 cm by MRI.  It was spiculated and irregular.  There was no evidence of contralateral disease.   Maria Pruitt was treated neoadjuvantly with docetaxel and cyclophosphamide for 6 cycles completed in July 2011, after which Maria Pruitt underwent left lumpectomy and axillary dissection August 2011 for a ypT1b yTN3 (14 positive lymph  nodes), Stage IIIC, grade 1 residual tumor. HER-2/neu was repeated, again not amplified. After completing local regional radiation October 2011 Maria Pruitt started letrozole November 2011, continued to September 2016, which Maria Pruitt was found to have metastatic disease   METASTATIC DISEASE HISTORY: From the 06/08/2015 summary:   Maria Pruitt for a Maria problem accompanied by her sister Maria Pruitt. To summarize the recent history: Maria Pruitt had a hemorrhagic stroke in January 2016 with some residuals. Maria Pruitt has been living next door to her son in the [place] area since then.--In August Maria Pruitt presented to her primary care physician, Dr. Thornton Papas, with a complaint of abdominal discomfort, nausea and vomiting. He treated her for a UTI w/o resolution and obtained a KUB in his office; this was nondiagnostic. Accordingly he set up the patient for CT scan of the abdomen and pelvis 05/06/2015. This showed the left adrenal gland to be enlarged to 7.5 x 4.0 cm. There was eccentric wall thickening of the gastric cardia. There were small lymph nodes in the upper abdomen and retroperitoneum. There was no liver involvement. The right adrenal gland was unremarkable. There was severe left kidney atrophy (congenital).  The patient was then set up for biopsy of the left adrenal mass on 05/30/2015. This showed Maria Pruitt [id number]) metastatic carcinoma which was cytokeratin 7 positive, with rare cytokeratin 20 positivity and was negative for gross cystic disease fluid protein. The prognostic panel showed this tumor to be estrogen and progesterone receptor negative, HER-2 negative, with an MIB-1 of 90-100%.   PET scan10/03/2015 which showed in additional  to the left adrenal mass, some regional lymph nodes and multiple bone lesions. There was no obvious primary tumor. Maria Pruitt also had an EGD to evaluate a possible gastric primary suggested by the yearly a CT scan of the chest. This was normal. We also obtained tumor markerswhich showed a normal CA 19,a  mildly elevated CEA at 9.7,Maria Pruitt is significantly elevated CA-27-29 at 125. Given this data, the most likely interpretation is that we are dealing with an estrogen receptor negative recurrence of her earlier estrogen receptor positive breast cancer, now stage IV  Her subsequent history is as detailed below  INTERVAL HISTORY: Maria Pruitt for follow-up of her metastatic triple negative breast cancer, accompanied by a friend. Maria Pruitt is being treated with capecitabine, 1.5 g twice daily 7 days on and 7 days off. Maria Pruitt is currently on the "off" week and is scheduled to restart 08/15/2015. Maria Pruitt is due for her next dose of denosumab Pruitt.  Maria Pruitt tolerates the capecitabine well for the most part. Maria Pruitt had diarrhea for the first time with this cycle. Maria Pruitt denies mouth sores or palmar plantar erythrodysesthesia. Her mouth is excessively dry, however.   REVIEW OF SYSTEMS: Maria Pruitt feels better ON the drug, than on her off week. The pain to her left back and flank increases and the pain spurs nausea. Maria Pruitt is using zofran PRN. The pain was more intense this cycle, but Maria Pruitt is reluctant to use her norco prescription. Maria Pruitt has leg cramps as well. Maria Pruitt has had blurred vision off and on, especially when going from high to dim light situations. A detailed review of systems is otherwise stable.   PAST MEDICAL HISTORY: Past Medical History  Diagnosis Date  . Hypertension   . Breast cancer ([place]) 2011    Stage 3  . Atrial fibrillation ([place])   . Hypercholesterolemia   . Abnormal ECG   . Allergy     seasonal  . Arthritis   . GERD (gastroesophageal reflux disease)   . 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 Seizures ([place])   . Stroke ([place])     dec 15  . Thyroid disease    1. Significant for pituitary adenoma which was removed in 1983 but recurred, requiring radiation and briefly some Parlodel.  Maria Pruitt has been off treatment since 1985.  Maria Pruitt has some acromegaly secondary to this tumor.   2. Maria Pruitt is  status post bilateral submuscular breast implants under Dr. Towanda Malkin in 1987.   3. Maria Pruitt is status post C-section for her third child who was hydrocephalic.   4. Maria Pruitt underwent rhinoplasty in 1984.   5. Maria Pruitt has a history of hypertension. 6. History of mitral valve prolapse, but Maria Pruitt says Maria Pruitt has not been receiving antibiotics prior to dental or similar procedures.   7. History of hypothyroidism.   8. History of congenital absence of one kidney.   9. History of hyperlipidemia.  10. History of obesity.   11. History of renal stones.   12. History of fatty liver.   13. History of gout.   14. History of palpitations.   History of childhood asthma  PAST SURGICAL HISTORY: Past Surgical History  Procedure Laterality Date  . Cesarean section  1980  . Breast enhancement surgery  1987  . Umbilical hernia repair    . Tubal ligation    . Scar revision    . Pituitary surgery      adenoma  . Rhinoplasty  1985  . Breast lumpectomy Left   .  Colonoscopy      FAMILY HISTORY Family History  Problem Relation Age of Onset  . Heart disease Mother   . Heart attack Father   . Heart attack Brother   . Colon cancer Neg Hx    The patient's father died from a myocardial infarction at the age of 74.  The patient's mother died from complications of congestive heart failure at the age of 50.  The patient had one brother who died from a myocardial infarction at the age of 76.  One of the brothers and two sisters are alive and well.  Sr. Maria Pruitt is an ICU nurse. There is no history of breast or ovarian cancer in the family to her knowledge.  GYNECOLOGIC HISTORY: Maria Pruitt is GX P3, first pregnancy to term at age 22.  Maria Pruitt went through the change of life at the time of her pituitary surgery in 1983.  Maria Pruitt took hormone replacement for less than a year because of poor tolerance.    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  Maria Pruitt gave up her job  in 08-13-10.  Her first husband died from chronic myeloid leukemia in 1985.  Her three children from that marriage include her son Randall Hiss who died from complications of hydrocephaly at age 45; son [name] who is a Insurance underwriter and son [name] who is an Public house manager in this area.  The patient has five grandchildren.  Her second husband of 20+ years, Ron, is a Company secretary of an independent [employer] and also does Writer and is a Oceanographer. He is now disabled due toa traumatic brain injury. He lost his first wife to endometrial cancer.  He has a daughter from that marriage.  Maria Pruitt lives in [place]: not in place  HEALTH MAINTENANCE: Social History  Substance Use Topics  . Smoking status: Never Smoker   . Smokeless tobacco: Never Used  . Alcohol Use: No     Colonoscopy:  PAP:  Bone density: January 2012/ osteopenia  Lipid panel:  Allergies  Allergen Reactions  . Ciprofloxacin Other (See Comments)  . Clonidine Derivatives Other (See Comments)  . Phenytoin     Other reaction(s): Other Elevated LFTs  . Sulfa Antibiotics Hives  . Benzonatate     REACTION: Swelling, tessalon perles  . Codeine     REACTION: Nausea    Current Outpatient Prescriptions  Medication Sig Dispense Refill  . acetaminophen (TYLENOL) 500 MG tablet Take 500 mg by mouth every 6 (six) hours as needed.    . Ascorbic Acid (VITAMIN C) 100 MG tablet Take 1,000 mg by mouth.    Marland Kitchen aspirin (ASPIRIN CHILDRENS) 81 MG chewable tablet Chew 1 tablet (81 mg total) by mouth daily. 100 tablet 4  . atorvastatin (LIPITOR) 40 MG tablet Take 1 tablet (40 mg total) by mouth daily. 90 tablet 4  . DANDELION PO Take by mouth.    . famotidine (PEPCID) 20 MG tablet Take 1 tablet (20 mg total) by mouth 2 (two) times daily. 180 tablet 4  . hydrochlorothiazide (HYDRODIURIL) 25 MG tablet Take 1 tablet (25 mg total) by mouth daily.    Marland Kitchen levothyroxine (SYNTHROID, LEVOTHROID) 100 MCG tablet Take 88 mcg by mouth daily before  breakfast.    . losartan (COZAAR) 50 MG tablet Take 50 mg by mouth daily.    . metoprolol (LOPRESSOR) 50 MG tablet Take 50 mg by mouth 2 (two) times  daily.    . ondansetron (ZOFRAN-ODT) 8 MG disintegrating tablet Take 1 tablet (8 mg total) by mouth 2 (two) times daily as needed for nausea or vomiting. 20 tablet 3  . ALPRAZolam (XANAX) 0.25 MG tablet Take 0.25 mg by mouth.    . capecitabine (XELODA) 500 MG tablet Take 3 tablets (1,500 mg total) by mouth 2 (two) times daily after a meal. 42 tablet 6  . hydrALAZINE (APRESOLINE) 10 MG tablet Take 1 tablet (10 mg total) by mouth 4 (four) times a day as needed (For SBP > 200 or DBP > 110).    Marland Kitchen HYDROcodone-acetaminophen (NORCO/VICODIN) 5-325 MG tablet Take 1 tablet by mouth every 6 (six) hours as needed for moderate pain. 60 tablet 0  . traMADol (ULTRAM) 50 MG tablet Take 1 tablet (50 mg total) by mouth every 6 (six) hours as needed. (Patient not taking: Reported on 08/12/2015) 30 tablet 3   No current facility-administered medications for this visit.    OBJECTIVE: Middle-aged white Pruitt in no acute distress Filed Vitals:   08/12/15 1142 08/12/15 1247  BP: 127/49 138/78  Pulse: 54   Temp: 97.5 F (36.4 C)   Resp: 18      Body mass index is 29.72 kg/(m^2).    ECOG FS: 1  Skin: warm, dry  HEENT: sclerae anicteric, conjunctivae pink, oropharynx clear. No thrush or mucositis.  Lymph Nodes: No cervical or supraclavicular lymphadenopathy  Lungs: clear to auscultation bilaterally, no rales, wheezes, or rhonci  Heart: regular rate and rhythm  Abdomen: round, soft, non tender, positive bowel sounds  Musculoskeletal: No focal spinal tenderness, no peripheral edema  Neuro: non focal, well oriented, positive affect  Breasts: deferred   LAB RESULTS: CBC Latest Ref Rng 08/12/2015 07/18/2015 06/08/2015  WBC 3.9 - 10.3 10e3/uL 4.7 5.7 6.2  Hemoglobin 11.6 - 15.9 g/dL 12.7 13.2 12.9  Hematocrit 34.8 - 46.6 % 36.6 37.3 36.0  Platelets 145 - 400 10e3/uL  195 189 181     CMP Latest Ref Rng 08/12/2015 07/18/2015 06/08/2015  Glucose 70 - 140 mg/dl 118 95 95  BUN 7.0 - 26.0 mg/dL 19.6 17.7 17.8  Creatinine 0.6 - 1.1 mg/dL 0.9 0.8 0.8  Sodium 136 - 145 mEq/L 134(L) 134(L) 135(L)  Potassium 3.5 - 5.1 mEq/L 4.0 4.4 3.7  Chloride 96 - 112 mEq/L - - -  CO2 22 - 29 mEq/L _0 Calcium 8.4 - 10.4 mg/dL 9.0 9.8 9.1  Total Protein 6.4 - 8.3 g/dL 7.5 7.3 7.1  Total Bilirubin 0.20 - 1.20 mg/dL 1.11 0.84 0.79  Alkaline Phos 40 - 150 U/L 156(H) 154(H) 156(H)  AST 5 - 34 U/L 38(H) 36(H) 33  ALT 0 - 55 U/L 25 39 24    STUDIES: No results found.  ASSESSMENT: 69 y.o. Maria Pruitt, Maria Pruitt  (1) status post left breast biopsy in February of 2011 for an invasive lobular carcinoma measuring 2.9 cm by MRI, with a positive prechemotherapy axillary lymph node biopsy, strongly estrogen and progesterone receptor positive with no evidence of HER-2/neu amplification and an MIB-1 of 14%.   (2) received 6 cycles of docetaxel/ cyclophosphamide in the neoadjuvant setting, completed in mid July of 2011   (3) s/p left lumpectomy and axillary lymph node dissection in August of 2011 for a ypT1b yTN3 (14 positive lymph nodes), Stage IIIC, grade 1 residual tumor. HER-2/neu was repeated, again not amplified.   (4) Completed loco-regional radiation in late October of 2011   (5) on letrozole  starting early November 2011, continued to October 2016, with progression  METASTATIC DISEASE: September 2016: Involving left adrenal gland, regional nodes and bones  (6) biopsy of a 7.5 cm left adrenal mass 05/30/2015 showed metastatic carcinoma, estrogen, progesterone, and HER-2 negative, with an MIB-1 of 90-100%; the tumor cells were cytokeratin 7 positive, cytokeratin 20 focally positive, gross cystic disease fluid protein negative  (a) the CA-27-29 is informative  (b) PET scan 06/17/2015 shows, in addition to the left adrenal mass, some periaortic adenopathy and  multiple hypermetabolic bone metastases (L2, T9, T2 and others)  (c) EGD on 06/16/2015 to evaluate suspicious cardiac wall thickening showed no evidence of malignancy  (7) capecitabine started 07/04/2015 at 1.5 g BID 7/7  (8) denosumab/ Xgeva started 07/18/2015, repeated monthly   OTHER PROBLEMS: (a) the patient is status post hemorrhagic CVA January 2016  (b) congenital absence of left kidney   PLAN:  Maria Pruitt continues to manage the capecitabine well. We reviewed the use of imodium should her diarrhea be a recurrent symptom, though it has not been a problem in the past. We discussed biotene and restarting the xylitol rinses for her dry mouth. I also encouraged her not to let the pain get too uncontrolled before considering use of her narcotics.  Maria Pruitt will proceed with her next xgeva injection as planned Pruitt.  The abdominal MRI that is supposed to occur within in the next 2 weeks has not been scheduled yet. I will try to follow up on this.   Maria Pruitt will return in 2 weeks prior to the start of her next cycle. Maria Pruitt understands and agrees with this plan. Maria Pruitt knows the goal of treatment in her case is control. Maria Pruitt has been encouraged to call with any issues that might arise before her next visit here.   Maria Panda, NP 4:41 PM  08/12/2015

## 2015-08-12 NOTE — Telephone Encounter (Signed)
Gave and printd appt sched and avs for pt for DEC and Jan  °

## 2015-08-13 LAB — CANCER ANTIGEN 27.29: CA 27.29: 207 U/mL — ABNORMAL HIGH (ref 0–39)

## 2015-08-15 ENCOUNTER — Ambulatory Visit: Payer: Medicare Other | Admitting: Nurse Practitioner

## 2015-08-15 ENCOUNTER — Ambulatory Visit: Payer: Medicare Other

## 2015-08-15 ENCOUNTER — Other Ambulatory Visit: Payer: Medicare Other

## 2015-08-23 ENCOUNTER — Ambulatory Visit (HOSPITAL_COMMUNITY)
Admission: RE | Admit: 2015-08-23 | Discharge: 2015-08-23 | Disposition: A | Discharge status: Home / Self Care | Payer: Medicare Other | Source: Ambulatory Visit | Attending: Nurse Practitioner | Admitting: Nurse Practitioner

## 2015-08-23 DIAGNOSIS — C7951 Secondary malignant neoplasm of bone: Secondary | ICD-10-CM | POA: Insufficient documentation

## 2015-08-23 DIAGNOSIS — C50412 Malignant neoplasm of upper-outer quadrant of left female breast: Secondary | ICD-10-CM | POA: Diagnosis not present

## 2015-08-23 DIAGNOSIS — R599 Enlarged lymph nodes, unspecified: Secondary | ICD-10-CM | POA: Insufficient documentation

## 2015-08-23 DIAGNOSIS — C779 Secondary and unspecified malignant neoplasm of lymph node, unspecified: Secondary | ICD-10-CM | POA: Diagnosis not present

## 2015-08-23 MED ORDER — GADOBENATE DIMEGLUMINE 529 MG/ML IV SOLN
15.0000 mL | Freq: Once | INTRAVENOUS | Status: AC | PRN
  Administered 2015-08-23: 15 mL via INTRAVENOUS

## 2015-08-29 ENCOUNTER — Other Ambulatory Visit: Payer: Self-pay | Admitting: *Deleted

## 2015-08-29 ENCOUNTER — Encounter: Payer: Self-pay | Admitting: Nurse Practitioner

## 2015-08-29 ENCOUNTER — Telehealth: Payer: Self-pay | Admitting: Nurse Practitioner

## 2015-08-29 ENCOUNTER — Ambulatory Visit (HOSPITAL_BASED_OUTPATIENT_CLINIC_OR_DEPARTMENT_OTHER): Payer: Medicare Other | Admitting: Nurse Practitioner

## 2015-08-29 ENCOUNTER — Other Ambulatory Visit (HOSPITAL_BASED_OUTPATIENT_CLINIC_OR_DEPARTMENT_OTHER): Payer: Medicare Other

## 2015-08-29 VITALS — BP 114/75 | HR 68 | Temp 97.4°F | Resp 18 | Wt 171.6 lb

## 2015-08-29 DIAGNOSIS — C50912 Malignant neoplasm of unspecified site of left female breast: Secondary | ICD-10-CM

## 2015-08-29 DIAGNOSIS — C7972 Secondary malignant neoplasm of left adrenal gland: Secondary | ICD-10-CM | POA: Diagnosis not present

## 2015-08-29 DIAGNOSIS — Z8673 Personal history of transient ischemic attack (TIA), and cerebral infarction without residual deficits: Secondary | ICD-10-CM

## 2015-08-29 DIAGNOSIS — C7951 Secondary malignant neoplasm of bone: Secondary | ICD-10-CM

## 2015-08-29 LAB — CBC WITH DIFFERENTIAL/PLATELET
BASO%: 0.8 % (ref 0.0–2.0)
Basophils Absolute: 0 10*3/uL (ref 0.0–0.1)
EOS%: 2.1 % (ref 0.0–7.0)
Eosinophils Absolute: 0.1 10*3/uL (ref 0.0–0.5)
HEMATOCRIT: 38.9 % (ref 34.8–46.6)
HGB: 13.4 g/dL (ref 11.6–15.9)
LYMPH#: 1.2 10*3/uL (ref 0.9–3.3)
LYMPH%: 22.9 % (ref 14.0–49.7)
MCH: 36.3 pg — ABNORMAL HIGH (ref 25.1–34.0)
MCHC: 34.6 g/dL (ref 31.5–36.0)
MCV: 105.1 fL — ABNORMAL HIGH (ref 79.5–101.0)
MONO#: 0.7 10*3/uL (ref 0.1–0.9)
MONO%: 13.5 % (ref 0.0–14.0)
NEUT%: 60.7 % (ref 38.4–76.8)
NEUTROS ABS: 3.2 10*3/uL (ref 1.5–6.5)
PLATELETS: 215 10*3/uL (ref 145–400)
RBC: 3.7 10*6/uL (ref 3.70–5.45)
RDW: 17.6 % — ABNORMAL HIGH (ref 11.2–14.5)
WBC: 5.3 10*3/uL (ref 3.9–10.3)

## 2015-08-29 LAB — COMPREHENSIVE METABOLIC PANEL
ALT: 33 U/L (ref 0–55)
ANION GAP: 8 meq/L (ref 3–11)
AST: 45 U/L — ABNORMAL HIGH (ref 5–34)
Albumin: 3.9 g/dL (ref 3.5–5.0)
Alkaline Phosphatase: 183 U/L — ABNORMAL HIGH (ref 40–150)
BILIRUBIN TOTAL: 1.38 mg/dL — AB (ref 0.20–1.20)
BUN: 16.5 mg/dL (ref 7.0–26.0)
CALCIUM: 8.8 mg/dL (ref 8.4–10.4)
CO2: 28 meq/L (ref 22–29)
CREATININE: 1 mg/dL (ref 0.6–1.1)
Chloride: 95 mEq/L — ABNORMAL LOW (ref 98–109)
EGFR: 59 mL/min/{1.73_m2} — ABNORMAL LOW (ref >90)
Glucose: 87 mg/dl (ref 70–140)
Potassium: 4.2 mEq/L (ref 3.5–5.1)
Sodium: 132 mEq/L — ABNORMAL LOW (ref 136–145)
TOTAL PROTEIN: 7.7 g/dL (ref 6.4–8.3)

## 2015-08-29 MED ORDER — ONDANSETRON 8 MG PO TBDP: 8.0000 mg | ORAL_TABLET | Freq: Two times a day (BID) | ORAL | Status: DC | PRN

## 2015-08-29 NOTE — Telephone Encounter (Signed)
Appointments made and avs printed for patient,i called [facility] for [facility] to call me to coordinate her chemo ed class as well  Maria Pruitt

## 2015-08-29 NOTE — Telephone Encounter (Signed)
Spoke with patients son whom was at her home and he is aware of her chemo ed and port placement appointment,tiffany states baby asp. Is ok to take

## 2015-08-29 NOTE — Progress Notes (Signed)
. IDEna Pruitt   DOB: 1946/06/22  MR#: [id number]  PPI#:[id number]  Maria Nova, PA-C GYN:  SU:  OTHER MD:  CHIEF COMPLAINT: left adrenal metastatsis  CURRENT TREATMENT: capecitabine, denosumab  BREAST CANCER HISTORY:  from the original intake note:  Maria Pruitt herself palpated a change in her left breast, shortly before her mammogram was due.  She brought this to Dr. Wilder Pruitt attention and he set her up for diagnostic mammography on October 31, 2009.   Routine and implant displaced views of both breasts showed a 2 cm irregular mass in the outer left breast, seen only on the spot tangential view.  The breast tissue itself was fatty.  There were no suspicious calcifications.  This mass was palpable to Dr. Melanee Pruitt.  An ultrasound showed it to be 1.7 cm irregular and hypoechoic.  There were at least two enlarged left axillary lymph nodes identified.  Dr. Melanee Pruitt recommended biopsy which was performed the same day and showed ([id number]) and invasive breast cancer felt most likely to be ductal with lobular features.  The left axillary lymph node biopsy also showed the same tumor (similar morphologic findings) and both prognostic profiles showed the cancer to be strongly ER and PR positive with a low to borderline proliferation fraction (13 and 14%).  Neither mass showed amplification of Her-2 by CISH.  (The ratios were 1.1 and 0.85.)    With this information, the patient was referred to Dr. Margot Pruitt and bilateral breast MRIs were obtained March 1st.  This showed several left axillary lymph nodes with cortical thickening, the largest one measuring 1.1 cm.  The mass itself measured 2.9 cm by MRI.  It was spiculated and irregular.  There was no evidence of contralateral disease.   She was treated neoadjuvantly with docetaxel and cyclophosphamide for 6 cycles completed in July 2011, after which she underwent left lumpectomy and axillary dissection August 2011 for a ypT1b yTN3 (14 positive lymph  nodes), Stage IIIC, grade 1 residual tumor. HER-2/neu was repeated, again not amplified. After completing local regional radiation October 2011 she started letrozole November 2011, continued to September 2016, which she was found to have metastatic disease   METASTATIC DISEASE HISTORY: From the 06/08/2015 summary:   Maria Pruitt returns today for a new problem accompanied by her sister Maria Pruitt. To summarize the recent history: She had a hemorrhagic stroke in January 2016 with some residuals. She has been living next door to her son in the [place] area since then.--In August she presented to her primary care physician, Dr. Thornton Pruitt, with a complaint of abdominal discomfort, nausea and vomiting. He treated her for a UTI w/o resolution and obtained a KUB in his office; this was nondiagnostic. Accordingly he set up the patient for CT scan of the abdomen and pelvis 05/06/2015. This showed the left adrenal gland to be enlarged to 7.5 x 4.0 cm. There was eccentric wall thickening of the gastric cardia. There were small lymph nodes in the upper abdomen and retroperitoneum. There was no liver involvement. The right adrenal gland was unremarkable. There was severe left kidney atrophy (congenital).  The patient was then set up for biopsy of the left adrenal mass on 05/30/2015. This showed Maria Pruitt [id number]) metastatic carcinoma which was cytokeratin 7 positive, with rare cytokeratin 20 positivity and was negative for gross cystic disease fluid protein. The prognostic panel showed this tumor to be estrogen and progesterone receptor negative, HER-2 negative, with an MIB-1 of 90-100%.   PET scan10/03/2015 which showed in additional to the  left adrenal mass, some regional lymph nodes and multiple bone lesions. There was no obvious primary tumor. She also had an EGD to evaluate a possible gastric primary suggested by the yearly a CT scan of the chest. This was normal. We also obtained tumor markerswhich showed a normal CA 19,a  mildly elevated CEA at 9.7,Maria Pruitt is significantly elevated CA-27-29 at 125. Given this data, the most likely interpretation is that we are dealing with an estrogen receptor negative recurrence of her earlier estrogen receptor positive breast cancer, now stage IV  Her subsequent history is as detailed below  INTERVAL HISTORY: Maria Pruitt returns today for follow-up of her metastatic triple negative breast cancer, accompanied by her son. She is being treated with capecitabine, 1.5 g twice daily 7 days on and 7 days off. She is here to review the results of the abdominal MRI she had performed last week.  REVIEW OF SYSTEMS: Maria Pruitt feels better ON the drug, than on her off week. The pain to her left back and flank increases and the pain spurs nausea. She is using zofran PRN. She has used her norco more for pain and this is helpful along side tylenol. This constipates her and she is using miralax off and on. She has some shortness of breath, but denies chest pain, cough, or palpitations. She has no headaches, dizziness, or vision changes. A detailed review of systems is otherwise stable.   PAST MEDICAL HISTORY: Past Medical History  Diagnosis Date  . Hypertension   . Breast cancer ([place]) 2011    Stage 3  . Atrial fibrillation ([place])   . Hypercholesterolemia   . Abnormal ECG   . Allergy     seasonal  . Arthritis   . GERD (gastroesophageal reflux disease)   . 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 Seizures ([place])   . Stroke ([place])     dec 15  . Thyroid disease    1. Significant for pituitary adenoma which was removed in 1983 but recurred, requiring radiation and briefly some Parlodel.  She has been off treatment since 1985.  She has some acromegaly secondary to this tumor.   2. She is status post bilateral submuscular breast implants under Maria Pruitt in 1987.   3. She is status post C-section for her third child who was hydrocephalic.   4. She underwent rhinoplasty in 1984.    5. She has a history of hypertension. 6. History of mitral valve prolapse, but she says she has not been receiving antibiotics prior to dental or similar procedures.   7. History of hypothyroidism.   8. History of congenital absence of one kidney.   9. History of hyperlipidemia.  10. History of obesity.   11. History of renal stones.   12. History of fatty liver.   13. History of gout.   14. History of palpitations.   History of childhood asthma  PAST SURGICAL HISTORY: Past Surgical History  Procedure Laterality Date  . Cesarean section  1980  . Breast enhancement surgery  1987  . Umbilical hernia repair    . Tubal ligation    . Scar revision    . Pituitary surgery      adenoma  . Rhinoplasty  1985  . Breast lumpectomy Left   . Colonoscopy      FAMILY HISTORY Family History  Problem Relation Age of Onset  . Heart disease Mother   . Heart attack Father   . Heart attack Brother   .  Colon cancer Neg Hx    The patient's father died from a myocardial infarction at the age of 67.  The patient's mother died from complications of congestive heart failure at the age of 37.  The patient had one brother who died from a myocardial infarction at the age of 42.  One of the brothers and two sisters are alive and well.  Sr. Maria Pruitt is an ICU nurse. There is no history of breast or ovarian cancer in the family to her knowledge.  GYNECOLOGIC HISTORY: She is GX P3, first pregnancy to term at age 39.  She went through the change of life at the time of her pituitary surgery in 1983.  She took hormone replacement for less than a year because of poor tolerance.    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  She gave up her job in 08/24/2010.  Her first husband died from chronic myeloid leukemia in 1985.  Her three children from that marriage include her son Randall Hiss who died from complications of hydrocephaly at age 42; son  [name] who is a Insurance underwriter and son [name] who is an Public house manager in this area.  The patient has five grandchildren.  Her second husband of 20+ years, Ron, is a Company secretary of an independent [employer] and also does Writer and is a Oceanographer. He is now disabled due toa traumatic brain injury. He lost his first wife to endometrial cancer.  He has a daughter from that marriage.  She lives in [place]: not in place  HEALTH MAINTENANCE: Social History  Substance Use Topics  . Smoking status: Never Smoker   . Smokeless tobacco: Never Used  . Alcohol Use: No     Colonoscopy:  PAP:  Bone density: January 2012/ osteopenia  Lipid panel:  Allergies  Allergen Reactions  . Ciprofloxacin Other (See Comments)  . Clonidine Derivatives Other (See Comments)  . Phenytoin     Other reaction(s): Other Elevated LFTs  . Sulfa Antibiotics Hives  . Benzonatate     REACTION: Swelling, tessalon perles  . Codeine     REACTION: Nausea    Current Outpatient Prescriptions  Medication Sig Dispense Refill  . acetaminophen (TYLENOL) 500 MG tablet Take 500 mg by mouth every 6 (six) hours as needed.    . ALPRAZolam (XANAX) 0.25 MG tablet Take 0.25 mg by mouth.    . Ascorbic Acid (VITAMIN C) 100 MG tablet Take 1,000 mg by mouth.    Marland Kitchen aspirin (ASPIRIN CHILDRENS) 81 MG chewable tablet Chew 1 tablet (81 mg total) by mouth daily. 100 tablet 4  . atorvastatin (LIPITOR) 40 MG tablet Take 1 tablet (40 mg total) by mouth daily. 90 tablet 4  . capecitabine (XELODA) 500 MG tablet Take 3 tablets (1,500 mg total) by mouth 2 (two) times daily after a meal. 42 tablet 6  . DANDELION PO Take by mouth.    . famotidine (PEPCID) 20 MG tablet Take 1 tablet (20 mg total) by mouth 2 (two) times daily. 180 tablet 4  . hydrALAZINE (APRESOLINE) 10 MG tablet Take 1 tablet (10 mg total) by mouth 4 (four) times a day as needed (For SBP > 200 or DBP > 110).    . hydrochlorothiazide (HYDRODIURIL) 25 MG tablet Take  1 tablet (25 mg total) by mouth daily.    Marland Kitchen HYDROcodone-acetaminophen (NORCO/VICODIN) 5-325 MG tablet Take 1 tablet by mouth every  6 (six) hours as needed for moderate pain. 60 tablet 0  . levothyroxine (SYNTHROID, LEVOTHROID) 100 MCG tablet Take 88 mcg by mouth daily before breakfast.    . losartan (COZAAR) 50 MG tablet Take 50 mg by mouth daily.    . metoprolol (LOPRESSOR) 50 MG tablet Take 50 mg by mouth 2 (two) times daily.    . traMADol (ULTRAM) 50 MG tablet Take 1 tablet (50 mg total) by mouth every 6 (six) hours as needed. 30 tablet 3  . ondansetron (ZOFRAN-ODT) 8 MG disintegrating tablet Take 1 tablet (8 mg total) by mouth 2 (two) times daily as needed for nausea or vomiting. 20 tablet 3   No current facility-administered medications for this visit.    OBJECTIVE: Middle-aged white woman in no acute distress Filed Vitals:   08/29/15 1221  BP: 114/75  Pulse: 68  Temp: 97.4 F (36.3 C)  Resp: 18     Body mass index is 29.44 kg/(m^2).    ECOG FS: 1  Sclerae unicteric, pupils round and equal Oropharynx clear and moist-- no thrush or other lesions No cervical or supraclavicular adenopathy Lungs no rales or rhonchi Heart regular rate and rhythm Abd soft, nontender, positive bowel sounds MSK no focal spinal tenderness, no upper extremity lymphedema Neuro: nonfocal, well oriented, appropriate affect Breasts: deferred   LAB RESULTS: CBC Latest Ref Rng 08/29/2015 08/12/2015 07/18/2015  WBC 3.9 - 10.3 10e3/uL 5.3 4.7 5.7  Hemoglobin 11.6 - 15.9 g/dL 13.4 12.7 13.2  Hematocrit 34.8 - 46.6 % 38.9 36.6 37.3  Platelets 145 - 400 10e3/uL 215 195 189     CMP Latest Ref Rng 08/29/2015 08/12/2015 07/18/2015  Glucose 70 - 140 mg/dl 87 118 95  BUN 7.0 - 26.0 mg/dL 16.5 19.6 17.7  Creatinine 0.6 - 1.1 mg/dL 1.0 0.9 0.8  Sodium 136 - 145 mEq/L 132(L) 134(L) 134(L)  Potassium 3.5 - 5.1 mEq/L 4.2 4.0 4.4  Chloride 96 - 112 mEq/L - - -  CO2 22 - 29 mEq/L _0 Calcium 8.4 - 10.4 mg/dL  8.8 9.0 9.8  Total Protein 6.4 - 8.3 g/dL 7.7 7.5 7.3  Total Bilirubin 0.20 - 1.20 mg/dL 1.38(H) 1.11 0.84  Alkaline Phos 40 - 150 U/L 183(H) 156(H) 154(H)  AST 5 - 34 U/L 45(H) 38(H) 36(H)  ALT 0 - 55 U/L 33 25 39    STUDIES: Mr Abdomen W Wo Contrast  08/24/2015  CLINICAL DATA:  69 year old female with history of metastatic breast cancer. Known bone metastasis, metastatic periaortic adenopathy in the upper abdomen and left adrenal gland metastasis. EXAM: MRI ABDOMEN WITHOUT AND WITH CONTRAST TECHNIQUE: Multiplanar multisequence MR imaging of the abdomen was performed both before and after the administration of intravenous contrast. CONTRAST:  23m MULTIHANCE GADOBENATE DIMEGLUMINE 529 MG/ML IV SOLN COMPARISON:  PET-CT 06/17/2015. FINDINGS: Lower chest: Bilateral breast implants incidentally noted. Otherwise, unremarkable. Hepatobiliary: The liver has a nodular contour, suggestive of underlying cirrhosis. No discrete cystic or solid hepatic lesions are identified. No intra or extrahepatic biliary ductal dilatation. Gallbladder is normal in appearance. Pancreas: No pancreatic mass. No pancreatic ductal dilatation. No pancreatic or peripancreatic fluid or inflammatory changes. Spleen: Unremarkable. Adrenals/Urinary Tract: 4.7 x 2.5 cm mass in the left adrenal gland demonstrates heterogeneous enhancement (image 39 of series 1303), compatible with a metastatic lesion. This lesion is inseparable from the adjacent left para-aortic lymphadenopathy. Right adrenal gland and right kidney are normal in appearance. Left kidney is not visualized, presumably surgically resected or congenitally absent (no  susceptibility artifact from surgical clips are identified). No right-sided hydroureteronephrosis in the visualized abdomen. Stomach/Bowel: Visualized portions are unremarkable. Vascular/Lymphatic: No aneurysm identified in the visualized abdominal vasculature. Bulky of left-sided para-aortic lymphadenopathy again  noted, measuring up to 4.7 x 3.9 cm (image 50 of series 1303), extending cephalad from the level of the aortic bifurcation to the upper retroperitoneum. Mildly enlarged right common iliac lymph node measuring 1 cm in short axis also noted. Other: No significant volume of ascites in the visualized peritoneal cavity. Musculoskeletal: Aggressive appearing lesion in the L2 vertebral body anteriorly is low T1 signal intensity, heterogeneously hyperintense T2 signal intensity, and avid enhancement, compatible with a metastatic lesion. IMPRESSION: 1. Bulky metastatic lymphadenopathy in the left para-aortic nodal stations and right common iliac station appears slightly increased in number and bulk compared to prior PET-CT 06/17/2015, presumably reflective of progression of metastatic adenopathy. 2. 4.7 x 2.5 cm left adrenal mass is unchanged, also likely metastatic. 3. Aggressive appearing osseous lesion in the L2 vertebral body is similar to the prior examination in terms of size, also likely metastatic. 4. Left kidney is not visualized, either surgically absent or congenitally absent. Electronically Signed   By: Vinnie Langton M.D.   On: 08/24/2015 08:18    ASSESSMENT: 69 y.o. Maria Pruitt, [place] woman  (1) status post left breast biopsy in February of 2011 for an invasive lobular carcinoma measuring 2.9 cm by MRI, with a positive prechemotherapy axillary lymph node biopsy, strongly estrogen and progesterone receptor positive with no evidence of HER-2/neu amplification and an MIB-1 of 14%.   (2) received 6 cycles of docetaxel/ cyclophosphamide in the neoadjuvant setting, completed in mid July of 2011   (3) s/p left lumpectomy and axillary lymph node dissection in August of 2011 for a ypT1b yTN3 (14 positive lymph nodes), Stage IIIC, grade 1 residual tumor. HER-2/neu was repeated, again not amplified.   (4) Completed loco-regional radiation in late October of 2011   (5) on letrozole starting early  November 2011, continued to October 2016, with progression  METASTATIC DISEASE: September 2016: Involving left adrenal gland, regional nodes and bones  (6) biopsy of a 7.5 cm left adrenal mass 05/30/2015 showed metastatic carcinoma, estrogen, progesterone, and HER-2 negative, with an MIB-1 of 90-100%; the tumor cells were cytokeratin 7 positive, cytokeratin 20 focally positive, gross cystic disease fluid protein negative  (a) the CA-27-29 is informative  (b) PET scan 06/17/2015 shows, in addition to the left adrenal mass, some periaortic adenopathy and multiple hypermetabolic bone metastases (L2, T9, T2 and others)  (c) EGD on 06/16/2015 to evaluate suspicious cardiac wall thickening showed no evidence of malignancy  (7) capecitabine started 07/04/2015 at 1.5 g BID 7/7  (8) denosumab/ Xgeva started 07/18/2015, repeated monthly   OTHER PROBLEMS: (a) the patient is status post hemorrhagic CVA January 2016  (b) congenital absence of left kidney   PLAN:  I reviewed the results of Maria Pruitt's most recent abdominal MRI with Dr. Jana Hakim. Unfortunately these is progressive disease noted. She will stop the capecitabine immediately, and we will move on to another treatment.   Dr. Jana Hakim discussed initiating eribulin every 21 days, given on days 1 and day 8 of every cycle. She understands that like any chemotherapy she runs the risk of nausea, vomiting, bowel changes, drop in counts, and alopecia. Eribulin has also been knows to cause peripheral neuropathy symptoms, which we will monitor closely. We anticipate beginning after the holidays in the first week in July. She understands that this is a "  milder" treatment in comparison to other chemotherapy regimens, and wants to make it clear that should this not be effective, harder treatments are not off the table as far as she is concerned. We will give the eribulin 9 weeks (6 doses) before we repeat a liver MRI.   Maysoon will have a port placed next  week, and will attend chemotherapy school before treatment is initiated. She will return next Friday to discuss her antiemetic schedule since she will not be able to see me the day of her first cycle. She understands and agrees with this plan. She knows the goal of treatment in her case is control. She has been encouraged to call with any issues that might arise before her next visit here.   Laurie Panda, NP 08/29/2015 3:40 PM

## 2015-09-01 ENCOUNTER — Telehealth: Payer: Self-pay | Admitting: *Deleted

## 2015-09-01 NOTE — Telephone Encounter (Signed)
TC from pt's sister, Olean Ree. She states that pt is experiencing increased back pain that radiates to her abd, not being controlled with current meds. She is concerned about the tylenol dosages she is taking daily.  Currently she takes Tylenol ES 2 tabs in the morning and 2 tabs at night and then uses hydrocodone/apap 5/325 for break through pain, though now using 2 tabs 2-3 times a day. She is requesting stronger pain meds but with  less tylenol-perhaps plain oxycodone.  Cannot atke Ibuprofen d/t h/o hemorrhagic stroke.  Call sister, Di Kindle on her cell phone  [phone])

## 2015-09-03 ENCOUNTER — Other Ambulatory Visit: Payer: Self-pay | Admitting: Oncology

## 2015-09-03 NOTE — Progress Notes (Unsigned)
Called patient-- she tells me she had a couple of rough days w low BP and nausea-- she was unable to take pain meds-- so she had more pain-- but it was the same pain. The problem is now over-- she feels back to normal-- and her pain is well controlled.  (She feels her sister, who is now working in a Hospice setting, is overly concenred).  No changes made

## 2015-09-06 NOTE — Telephone Encounter (Signed)
This RN spoke with pt today - she states MD contacted her over the weekend.  Overall her pain is controlled- with " pain really more nausea ".  Maria Pruitt states " it was my sister that called because she does not like to see uncomfortable "  Per discussion today Maria Pruitt states overall symptoms are " ok " . She verbalized understanding to call if needed.  No other concerns at this time.

## 2015-09-07 ENCOUNTER — Other Ambulatory Visit: Payer: Self-pay | Admitting: Radiology

## 2015-09-07 ENCOUNTER — Telehealth: Payer: Self-pay | Admitting: *Deleted

## 2015-09-07 NOTE — Telephone Encounter (Signed)
Patient called "requesting antibiotic.  May have a fever but do not have a thermometer. My cheeks are flushed, I have a nagging headache to my forehead, ache at my left adrenal mass, nausea.  I do not want to put off port-a-cath placement tomorrow.  Cannot take ibuprofen due to stroke this year so I'm taking the Norco 5-325 mg and occasional ES Tylenol knowing not to take more than 3,000 mg.  Will send a family member to get a thermometer when someone arrives home this evening."    Denies cough, nasal or chest congestion, trouble urinating, Advised she FF and continue tylenol.  Notified APP Gentry Fitz of this call.  Verbal orders received to FF, take tylenol, assure her temperature will be checked before port placement.  If her warmth persists, go to ED or temperature persists, doesn't respond to tylenol, T >101.5 with chills go to the ED.  No questions or further complaints at this time.

## 2015-09-08 ENCOUNTER — Encounter: Payer: Self-pay | Admitting: *Deleted

## 2015-09-08 ENCOUNTER — Other Ambulatory Visit: Payer: Self-pay | Admitting: *Deleted

## 2015-09-08 ENCOUNTER — Other Ambulatory Visit: Payer: Medicare Other

## 2015-09-08 ENCOUNTER — Ambulatory Visit (HOSPITAL_COMMUNITY)
Admission: RE | Admit: 2015-09-08 | Discharge: 2015-09-08 | Disposition: A | Discharge status: Home / Self Care | Payer: Medicare Other | Source: Ambulatory Visit | Attending: Nurse Practitioner | Admitting: Nurse Practitioner

## 2015-09-08 ENCOUNTER — Other Ambulatory Visit: Payer: Self-pay | Admitting: Nurse Practitioner

## 2015-09-08 ENCOUNTER — Encounter (HOSPITAL_COMMUNITY): Payer: Self-pay

## 2015-09-08 ENCOUNTER — Ambulatory Visit (HOSPITAL_BASED_OUTPATIENT_CLINIC_OR_DEPARTMENT_OTHER): Payer: Medicare Other

## 2015-09-08 ENCOUNTER — Ambulatory Visit (HOSPITAL_COMMUNITY)
Admission: RE | Admit: 2015-09-08 | Discharge: 2015-09-08 | Disposition: A | Discharge status: Home / Self Care | Payer: Medicare Other | Source: Ambulatory Visit | Attending: Oncology | Admitting: Oncology

## 2015-09-08 DIAGNOSIS — C50912 Malignant neoplasm of unspecified site of left female breast: Secondary | ICD-10-CM

## 2015-09-08 DIAGNOSIS — C50919 Malignant neoplasm of unspecified site of unspecified female breast: Secondary | ICD-10-CM | POA: Diagnosis present

## 2015-09-08 DIAGNOSIS — R300 Dysuria: Secondary | ICD-10-CM

## 2015-09-08 DIAGNOSIS — C7951 Secondary malignant neoplasm of bone: Principal | ICD-10-CM

## 2015-09-08 DIAGNOSIS — Z7982 Long term (current) use of aspirin: Secondary | ICD-10-CM | POA: Insufficient documentation

## 2015-09-08 LAB — URINALYSIS, MICROSCOPIC - CHCC
BLOOD: NEGATIVE
Bilirubin (Urine): NEGATIVE
GLUCOSE UR CHCC: NEGATIVE mg/dL
Ketones: NEGATIVE mg/dL
LEUKOCYTE ESTERASE: NEGATIVE
Nitrite: NEGATIVE
Protein: NEGATIVE mg/dL
SPECIFIC GRAVITY, URINE: 1.01 (ref 1.003–1.035)
UROBILINOGEN UR: 0.2 mg/dL (ref 0.2–1)
pH: 7.5 (ref 4.6–8.0)

## 2015-09-08 LAB — CBC WITH DIFFERENTIAL/PLATELET
BASOS ABS: 0 10*3/uL (ref 0.0–0.1)
BASOS PCT: 1 %
EOS ABS: 0.1 10*3/uL (ref 0.0–0.7)
Eosinophils Relative: 1 %
HEMATOCRIT: 37.3 % (ref 36.0–46.0)
HEMOGLOBIN: 13.4 g/dL (ref 12.0–15.0)
Lymphocytes Relative: 23 %
Lymphs Abs: 1.1 10*3/uL (ref 0.7–4.0)
MCH: 36.4 pg — ABNORMAL HIGH (ref 26.0–34.0)
MCHC: 35.9 g/dL (ref 30.0–36.0)
MCV: 101.4 fL — ABNORMAL HIGH (ref 78.0–100.0)
Monocytes Absolute: 0.8 10*3/uL (ref 0.1–1.0)
Monocytes Relative: 16 %
NEUTROS ABS: 2.9 10*3/uL (ref 1.7–7.7)
NEUTROS PCT: 59 %
Platelets: 178 10*3/uL (ref 150–400)
RBC: 3.68 MIL/uL — AB (ref 3.87–5.11)
RDW: 14.6 % (ref 11.5–15.5)
WBC: 4.9 10*3/uL (ref 4.0–10.5)

## 2015-09-08 LAB — PROTIME-INR
INR: 1.07 (ref 0.00–1.49)
PROTHROMBIN TIME: 14.1 s (ref 11.6–15.2)

## 2015-09-08 MED ORDER — SODIUM CHLORIDE 0.9 % IV SOLN
INTRAVENOUS | Status: DC
  Administered 2015-09-08: 13:00:00 via INTRAVENOUS

## 2015-09-08 MED ORDER — MIDAZOLAM HCL 2 MG/2ML IJ SOLN
INTRAMUSCULAR | Status: AC | PRN
  Administered 2015-09-08 (×4): 1 mg via INTRAVENOUS

## 2015-09-08 MED ORDER — FENTANYL CITRATE (PF) 100 MCG/2ML IJ SOLN
INTRAMUSCULAR | Status: AC
  Filled 2015-09-08: qty 4

## 2015-09-08 MED ORDER — HEPARIN SOD (PORK) LOCK FLUSH 100 UNIT/ML IV SOLN
INTRAVENOUS | Status: AC | PRN
  Administered 2015-09-08: 500 [IU]

## 2015-09-08 MED ORDER — LIDOCAINE-EPINEPHRINE 2 %-1:100000 IJ SOLN
INTRAMUSCULAR | Status: AC
  Filled 2015-09-08: qty 1

## 2015-09-08 MED ORDER — CEFAZOLIN SODIUM-DEXTROSE 2-3 GM-% IV SOLR
2.0000 g | Freq: Once | INTRAVENOUS | Status: AC
  Administered 2015-09-08: 2 g via INTRAVENOUS

## 2015-09-08 MED ORDER — HEPARIN SOD (PORK) LOCK FLUSH 100 UNIT/ML IV SOLN
INTRAVENOUS | Status: AC
  Filled 2015-09-08: qty 5

## 2015-09-08 MED ORDER — OXYCODONE HCL 5 MG PO TABS: 5.0000 mg | ORAL_TABLET | ORAL | Status: DC | PRN

## 2015-09-08 MED ORDER — LIDOCAINE HCL 1 % IJ SOLN
INTRAMUSCULAR | Status: AC
  Filled 2015-09-08: qty 20

## 2015-09-08 MED ORDER — CEFAZOLIN SODIUM-DEXTROSE 2-3 GM-% IV SOLR
INTRAVENOUS | Status: AC
  Administered 2015-09-08: 2 g via INTRAVENOUS
  Filled 2015-09-08: qty 50

## 2015-09-08 MED ORDER — FENTANYL CITRATE (PF) 100 MCG/2ML IJ SOLN
INTRAMUSCULAR | Status: AC | PRN
  Administered 2015-09-08: 50 ug via INTRAVENOUS

## 2015-09-08 MED ORDER — MIDAZOLAM HCL 2 MG/2ML IJ SOLN
INTRAMUSCULAR | Status: AC
  Filled 2015-09-08: qty 6

## 2015-09-08 NOTE — H&P (Signed)
HPI: Patient with metastatic breast cancer who has been seen by Oncology on 08/29/15 and scheduled today for image guided port a catheter placement  The patient has had a H&P performed within the last 30 days, all history, medications, and exam have been reviewed. The patient denies any interval changes since the H&P.  Medications: Prior to Admission medications   Medication Sig Start Date End Date Taking? Authorizing Provider  acetaminophen (TYLENOL) 500 MG tablet Take 500 mg by mouth every 6 (six) hours as needed.   Yes Historical Provider, MD  ALPRAZolam (XANAX) 0.25 MG tablet Take 0.25 mg by mouth. 10/21/14 10/21/15 Yes Historical Provider, MD  Ascorbic Acid (VITAMIN C) 100 MG tablet Take 1,000 mg by mouth.   Yes Historical Provider, MD  aspirin (ASPIRIN CHILDRENS) 81 MG chewable tablet Chew 1 tablet (81 mg total) by mouth daily. 11/04/14  Yes [name], MD  atorvastatin (LIPITOR) 40 MG tablet Take 1 tablet (40 mg total) by mouth daily. 11/04/14  Yes [name], MD  capecitabine (XELODA) 500 MG tablet Take 3 tablets (1,500 mg total) by mouth 2 (two) times daily after a meal. 08/12/15  Yes Laurie Panda, NP  famotidine (PEPCID) 20 MG tablet Take 1 tablet (20 mg total) by mouth 2 (two) times daily. 11/04/14  Yes [name], MD  hydrochlorothiazide (HYDRODIURIL) 25 MG tablet Take 1 tablet (25 mg total) by mouth daily. 06/08/15  Yes [name], MD  HYDROcodone-acetaminophen (NORCO/VICODIN) 5-325 MG tablet Take 1 tablet by mouth every 6 (six) hours as needed for moderate pain. 08/12/15  Yes Laurie Panda, NP  levothyroxine (SYNTHROID, LEVOTHROID) 100 MCG tablet Take 88 mcg by mouth daily before breakfast. 05/15/13  Yes Peter M Martinique, MD  losartan (COZAAR) 50 MG tablet Take 50 mg by mouth daily.   Yes Historical Provider, MD  metoprolol (LOPRESSOR) 50 MG tablet Take 50 mg by mouth 2 (two) times daily.   Yes Historical Provider, MD  DANDELION PO Take by mouth.     Historical Provider, MD  hydrALAZINE (APRESOLINE) 10 MG tablet Take 1 tablet (10 mg total) by mouth 4 (four) times a day as needed (For SBP > 200 or DBP > 110). 12/09/14   Historical Provider, MD  ondansetron (ZOFRAN-ODT) 8 MG disintegrating tablet Take 1 tablet (8 mg total) by mouth 2 (two) times daily as needed for nausea or vomiting. 08/29/15   Laurie Panda, NP  traMADol (ULTRAM) 50 MG tablet Take 1 tablet (50 mg total) by mouth every 6 (six) hours as needed. 06/24/15   [name], MD     Vital Signs: BP 153/87 mmHg  Pulse 54  Temp(Src) 97.7 F (36.5 C) (Oral)  Resp 18  Ht 5\' 4"  (1.626 m)  Wt 171 lb 9.6 oz (77.837 kg)  BMI 29.44 kg/m2  SpO2 100%  Physical Exam  Constitutional: She is oriented to person, place, and time. No distress.  HENT:  Head: Normocephalic and atraumatic.  Cardiovascular: Normal rate and regular rhythm.  Exam reveals no gallop and no friction rub.   No murmur heard. Pulmonary/Chest: Effort normal and breath sounds normal. No respiratory distress. She has no wheezes. She has no rales.  Abdominal: Soft. Bowel sounds are normal. She exhibits no distension. There is no tenderness.  Neurological: She is alert and oriented to person, place, and time.  Skin: Skin is warm and dry. She is not diaphoretic.    Mallampati Score:  MD Evaluation Airway: WNL Heart:  WNL Abdomen: WNL Chest/ Lungs: WNL ASA  Classification: 3 Mallampati/Airway Score: Two  Labs:  CBC:  Recent Labs  07/18/15 1010 08/12/15 1127 08/29/15 1144 09/08/15 1203  WBC 5.7 4.7 5.3 4.9  HGB 13.2 12.7 13.4 13.4  HCT 37.3 36.6 38.9 37.3  PLT 189 195 215 178    COAGS:  Recent Labs  09/08/15 1203  INR 1.07    BMP:  Recent Labs  06/08/15 1611 07/18/15 1011 08/12/15 1127 08/29/15 1145  NA 135* 134* 134* 132*  K 3.7 4.4 4.0 4.2  CO2 27 28 25 28   GLUCOSE 95 95 118 87  BUN 17.8 17.7 19.6 16.5  CALCIUM 9.1 9.8 9.0 8.8  CREATININE 0.8 0.8 0.9 1.0    LIVER  FUNCTION TESTS:  Recent Labs  06/08/15 1611 07/18/15 1011 08/12/15 1127 08/29/15 1145  BILITOT 0.79 0.84 1.11 1.38*  AST 33 36* 38* 45*  ALT 24 39 25 33  ALKPHOS 156* 154* 156* 183*  PROT 7.1 7.3 7.5 7.7  ALBUMIN 3.7 3.7 3.8 3.9    Assessment/Plan:  Metastatic breast cancer Seen by Oncology 08/29/15  Scheduled today for image guided port a catheter placement with sedation The patient has been NPO, no blood thinners taken, labs and vitals have been reviewed. Risks and Benefits discussed with the patient including, but not limited to bleeding, infection, pneumothorax, or fibrin sheath development and need for additional procedures. All of the patient's questions were answered, patient is agreeable to proceed. Consent signed and in chart.   SignedHedy Jacob 09/08/2015, 1:03 PM

## 2015-09-08 NOTE — Procedures (Signed)
Interventional Radiology Procedure Note  Procedure: Placement of a right IJ approach single lumen PowerPort.  Tip is positioned at the superior cavoatrial junction and catheter is ready for immediate use.  Complications: No immediate Recommendations:  - Ok to shower tomorrow - Do not submerge for 7 days - Routine line care   Signed,  Jawann Urbani S. Destry Bezdek, DO    

## 2015-09-08 NOTE — Discharge Instructions (Signed)
Implanted Port Insertion, Care After °Refer to this sheet in the next few weeks. These instructions provide you with information on caring for yourself after your procedure. Your health care provider may also give you more specific instructions. Your treatment has been planned according to current medical practices, but problems sometimes occur. Call your health care provider if you have any problems or questions after your procedure. °WHAT TO EXPECT AFTER THE PROCEDURE °After your procedure, it is typical to have the following:  °· Discomfort at the port insertion site. Ice packs to the area will help. °· Bruising on the skin over the port. This will subside in 3-4 days. °HOME CARE INSTRUCTIONS °· After your port is placed, you will get a manufacturer's information card. The card has information about your port. Keep this card with you at all times.   °· Know what kind of port you have. There are many types of ports available.   °· Wear a medical alert bracelet in case of an emergency. This can help alert health care workers that you have a port.   °· The port can stay in for as long as your health care provider believes it is necessary.   °· A home health care nurse may give medicines and take care of the port.   °· You or a family member can get special training and directions for giving medicine and taking care of the port at home.   °SEEK MEDICAL CARE IF:  °· Your port does not flush or you are unable to get a blood return.   °· You have a fever or chills. °SEEK IMMEDIATE MEDICAL CARE IF: °· You have new fluid or pus coming from your incision.   °· You notice a bad smell coming from your incision site.   °· You have swelling, pain, or more redness at the incision or port site.   °· You have chest pain or shortness of breath. °  °This information is not intended to replace advice given to you by your health care provider. Make sure you discuss any questions you have with your health care provider. °  °Document  Released: 06/17/2013 Document Revised: 09/01/2013 Document Reviewed: 06/17/2013 °Elsevier Interactive Patient Education ©2016 [employer] °Implanted Port Home Guide °An implanted port is a type of central line that is placed under the skin. Central lines are used to provide IV access when treatment or nutrition needs to be given through a person's veins. Implanted ports are used for long-term IV access. An implanted port may be placed because:  °· You need IV medicine that would be irritating to the small veins in your hands or arms.   °· You need long-term IV medicines, such as antibiotics.   °· You need IV nutrition for a long period.   °· You need frequent blood draws for lab tests.   °· You need dialysis.   °Implanted ports are usually placed in the chest area, but they can also be placed in the upper arm, the abdomen, or the leg. An implanted port has two main parts:  °· Reservoir. The reservoir is round and will appear as a small, raised area under your skin. The reservoir is the part where a needle is inserted to give medicines or draw blood.   °· Catheter. The catheter is a thin, flexible tube that extends from the reservoir. The catheter is placed into a large vein. Medicine that is inserted into the reservoir goes into the catheter and then into the vein.   °HOW WILL I CARE FOR MY INCISION SITE? °Do not get the   incision site wet. Bathe or shower as directed by your health care provider.  °HOW IS MY PORT ACCESSED? °Special steps must be taken to access the port:  °· Before the port is accessed, a numbing cream can be placed on the skin. This helps numb the skin over the port site.   °· Your health care provider uses a sterile technique to access the port. °· Your health care provider must put on a mask and sterile gloves. °· The skin over your port is cleaned carefully with an antiseptic and allowed to dry. °· The port is gently pinched between sterile gloves, and a needle is inserted into the  port. °· Only "non-coring" port needles should be used to access the port. Once the port is accessed, a blood return should be checked. This helps ensure that the port is in the vein and is not clogged.   °· If your port needs to remain accessed for a constant infusion, a clear (transparent) bandage will be placed over the needle site. The bandage and needle will need to be changed every week, or as directed by your health care provider.   °· Keep the bandage covering the needle clean and dry. Do not get it wet. Follow your health care provider's instructions on how to take a shower or bath while the port is accessed.   °· If your port does not need to stay accessed, no bandage is needed over the port.   °WHAT IS FLUSHING? °Flushing helps keep the port from getting clogged. Follow your health care provider's instructions on how and when to flush the port. Ports are usually flushed with saline solution or a medicine called heparin. The need for flushing will depend on how the port is used.  °· If the port is used for intermittent medicines or blood draws, the port will need to be flushed:   °· After medicines have been given.   °· After blood has been drawn.   °· As part of routine maintenance.   °· If a constant infusion is running, the port may not need to be flushed.   °HOW LONG WILL MY PORT STAY IMPLANTED? °The port can stay in for as long as your health care provider thinks it is needed. When it is time for the port to come out, surgery will be done to remove it. The procedure is similar to the one performed when the port was put in.  °WHEN SHOULD I SEEK IMMEDIATE MEDICAL CARE? °When you have an implanted port, you should seek immediate medical care if:  °· You notice a bad smell coming from the incision site.   °· You have swelling, redness, or drainage at the incision site.   °· You have more swelling or pain at the port site or the surrounding area.   °· You have a fever that is not controlled with  medicine. °  °This information is not intended to replace advice given to you by your health care provider. Make sure you discuss any questions you have with your health care provider. °  °Document Released: 08/27/2005 Document Revised: 06/17/2013 Document Reviewed: 05/04/2013 °Elsevier Interactive Patient Education ©2016 [employer] °Moderate Conscious Sedation, Adult °Sedation is the use of medicines to promote relaxation and relieve discomfort and anxiety. Moderate conscious sedation is a type of sedation. Under moderate conscious sedation you are less alert than normal but are still able to respond to instructions or stimulation. Moderate conscious sedation is used during short medical and dental procedures. It is milder than deep sedation or general anesthesia and   allows you to return to your regular activities sooner. °LET YOUR HEALTH CARE PROVIDER KNOW ABOUT:  °· Any allergies you have. °· All medicines you are taking, including vitamins, herbs, eye drops, creams, and over-the-counter medicines. °· Use of steroids (by mouth or creams). °· Previous problems you or members of your family have had with the use of anesthetics. °· Any blood disorders you have. °· Previous surgeries you have had. °· Medical conditions you have. °· Possibility of pregnancy, if this applies. °· Use of cigarettes, alcohol, or illegal drugs. °RISKS AND COMPLICATIONS °Generally, this is a safe procedure. However, as with any procedure, problems can occur. Possible problems include: °· Oversedation. °· Trouble breathing on your own. You may need to have a breathing tube until you are awake and breathing on your own. °· Allergic reaction to any of the medicines used for the procedure. °BEFORE THE PROCEDURE °· You may have blood tests done. These tests can help show how well your kidneys and liver are working. They can also show how well your blood clots. °· A physical exam will be done.   °· Only take medicines as directed by your  health care provider. You may need to stop taking medicines (such as blood thinners, aspirin, or nonsteroidal anti-inflammatory drugs) before the procedure.   °· Do not eat or drink at least 6 hours before the procedure or as directed by your health care provider. °· Arrange for a responsible adult, family member, or friend to take you home after the procedure. He or she should stay with you for at least 24 hours after the procedure, until the medicine has worn off. °PROCEDURE  °· An intravenous (IV) catheter will be inserted into one of your veins. Medicine will be able to flow directly into your body through this catheter. You may be given medicine through this tube to help prevent pain and help you relax. °· The medical or dental procedure will be done. °AFTER THE PROCEDURE °· You will stay in a recovery area until the medicine has worn off. Your blood pressure and pulse will be checked.   °·  Depending on the procedure you had, you may be allowed to go home when you can tolerate liquids and your pain is under control. °  °This information is not intended to replace advice given to you by your health care provider. Make sure you discuss any questions you have with your health care provider. °  °Document Released: 05/22/2001 Document Revised: 09/17/2014 Document Reviewed: 05/04/2013 °Elsevier Interactive Patient Education ©2016 [employer] °Moderate Conscious Sedation, Adult, Care After °Refer to this sheet in the next few weeks. These instructions provide you with information on caring for yourself after your procedure. Your health care provider may also give you more specific instructions. Your treatment has been planned according to current medical practices, but problems sometimes occur. Call your health care provider if you have any problems or questions after your procedure. °WHAT TO EXPECT AFTER THE PROCEDURE  °After your procedure: °· You may feel sleepy, clumsy, and have poor balance for several  hours. °· Vomiting may occur if you eat too soon after the procedure. °HOME CARE INSTRUCTIONS °· Do not participate in any activities where you could become injured for at least 24 hours. Do not: °¨ Drive. °¨ Swim. °¨ Ride a bicycle. °¨ Operate heavy machinery. °¨ Cook. °¨ Use power tools. °¨ Climb ladders. °¨ Work from a high place. °· Do not make important decisions or sign legal documents until you are improved. °·   If you vomit, drink water, juice, or soup when you can drink without vomiting. Make sure you have little or no nausea before eating solid foods. °· Only take over-the-counter or prescription medicines for pain, discomfort, or fever as directed by your health care provider. °· Make sure you and your family fully understand everything about the medicines given to you, including what side effects may occur. °· You should not drink alcohol, take sleeping pills, or take medicines that cause drowsiness for at least 24 hours. °· If you smoke, do not smoke without supervision. °· If you are feeling better, you may resume normal activities 24 hours after you were sedated. °· Keep all appointments with your health care provider. °SEEK MEDICAL CARE IF: °· Your skin is pale or bluish in color. °· You continue to feel nauseous or vomit. °· Your pain is getting worse and is not helped by medicine. °· You have bleeding or swelling. °· You are still sleepy or feeling clumsy after 24 hours. °SEEK IMMEDIATE MEDICAL CARE IF: °· You develop a rash. °· You have difficulty breathing. °· You develop any type of allergic problem. °· You have a fever. °MAKE SURE YOU: °· Understand these instructions. °· Will watch your condition. °· Will get help right away if you are not doing well or get worse. °  °This information is not intended to replace advice given to you by your health care provider. Make sure you discuss any questions you have with your health care provider. °  °Document Released: 06/17/2013 Document Revised:  09/17/2014 Document Reviewed: 06/17/2013 °Elsevier Interactive Patient Education ©2016 [employer] ° °

## 2015-09-08 NOTE — Progress Notes (Signed)
Here for class.  Still c/o of pain in side.  No fever.  Requests a u/a. Called Heather.  She will put in order.

## 2015-09-09 ENCOUNTER — Encounter: Payer: Self-pay | Admitting: Nurse Practitioner

## 2015-09-09 ENCOUNTER — Other Ambulatory Visit (HOSPITAL_BASED_OUTPATIENT_CLINIC_OR_DEPARTMENT_OTHER): Payer: Medicare Other

## 2015-09-09 ENCOUNTER — Ambulatory Visit (HOSPITAL_BASED_OUTPATIENT_CLINIC_OR_DEPARTMENT_OTHER): Payer: Medicare Other | Admitting: Nurse Practitioner

## 2015-09-09 ENCOUNTER — Ambulatory Visit (HOSPITAL_BASED_OUTPATIENT_CLINIC_OR_DEPARTMENT_OTHER): Payer: Medicare Other

## 2015-09-09 VITALS — BP 120/59 | HR 58 | Temp 97.5°F | Resp 20 | Ht 64.0 in | Wt 168.7 lb

## 2015-09-09 DIAGNOSIS — Z8673 Personal history of transient ischemic attack (TIA), and cerebral infarction without residual deficits: Secondary | ICD-10-CM

## 2015-09-09 DIAGNOSIS — C7951 Secondary malignant neoplasm of bone: Secondary | ICD-10-CM

## 2015-09-09 DIAGNOSIS — C50919 Malignant neoplasm of unspecified site of unspecified female breast: Secondary | ICD-10-CM

## 2015-09-09 DIAGNOSIS — C50912 Malignant neoplasm of unspecified site of left female breast: Secondary | ICD-10-CM | POA: Diagnosis not present

## 2015-09-09 DIAGNOSIS — C7972 Secondary malignant neoplasm of left adrenal gland: Secondary | ICD-10-CM | POA: Diagnosis not present

## 2015-09-09 DIAGNOSIS — Z171 Estrogen receptor negative status [ER-]: Secondary | ICD-10-CM

## 2015-09-09 DIAGNOSIS — C773 Secondary and unspecified malignant neoplasm of axilla and upper limb lymph nodes: Secondary | ICD-10-CM

## 2015-09-09 DIAGNOSIS — C50812 Malignant neoplasm of overlapping sites of left female breast: Secondary | ICD-10-CM

## 2015-09-09 DIAGNOSIS — C50412 Malignant neoplasm of upper-outer quadrant of left female breast: Secondary | ICD-10-CM

## 2015-09-09 LAB — COMPREHENSIVE METABOLIC PANEL
ALBUMIN: 4 g/dL (ref 3.5–5.0)
ALK PHOS: 206 U/L — AB (ref 40–150)
ALT: 17 U/L (ref 0–55)
AST: 26 U/L (ref 5–34)
Anion Gap: 10 mEq/L (ref 3–11)
BILIRUBIN TOTAL: 1.69 mg/dL — AB (ref 0.20–1.20)
BUN: 10.8 mg/dL (ref 7.0–26.0)
CO2: 27 mEq/L (ref 22–29)
CREATININE: 1 mg/dL (ref 0.6–1.1)
Calcium: 9.9 mg/dL (ref 8.4–10.4)
Chloride: 92 mEq/L — ABNORMAL LOW (ref 98–109)
EGFR: 57 mL/min/{1.73_m2} — ABNORMAL LOW (ref >90)
GLUCOSE: 102 mg/dL (ref 70–140)
POTASSIUM: 4.8 meq/L (ref 3.5–5.1)
SODIUM: 130 meq/L — AB (ref 136–145)
TOTAL PROTEIN: 8.4 g/dL — AB (ref 6.4–8.3)

## 2015-09-09 LAB — CBC WITH DIFFERENTIAL/PLATELET
BASO%: 0.7 % (ref 0.0–2.0)
Basophils Absolute: 0 10*3/uL (ref 0.0–0.1)
EOS%: 1.5 % (ref 0.0–7.0)
Eosinophils Absolute: 0.1 10*3/uL (ref 0.0–0.5)
HEMATOCRIT: 43.1 % (ref 34.8–46.6)
HEMOGLOBIN: 14.5 g/dL (ref 11.6–15.9)
LYMPH#: 1.1 10*3/uL (ref 0.9–3.3)
LYMPH%: 18 % (ref 14.0–49.7)
MCH: 35.9 pg — ABNORMAL HIGH (ref 25.1–34.0)
MCHC: 33.7 g/dL (ref 31.5–36.0)
MCV: 106.6 fL — ABNORMAL HIGH (ref 79.5–101.0)
MONO#: 0.9 10*3/uL (ref 0.1–0.9)
MONO%: 14.8 % — AB (ref 0.0–14.0)
NEUT%: 65 % (ref 38.4–76.8)
NEUTROS ABS: 4.1 10*3/uL (ref 1.5–6.5)
Platelets: 186 10*3/uL (ref 145–400)
RBC: 4.04 10*6/uL (ref 3.70–5.45)
RDW: 15.8 % — AB (ref 11.2–14.5)
WBC: 6.2 10*3/uL (ref 3.9–10.3)

## 2015-09-09 LAB — URINE CULTURE

## 2015-09-09 MED ORDER — PROCHLORPERAZINE MALEATE 10 MG PO TABS: 10.0000 mg | ORAL_TABLET | Freq: Four times a day (QID) | ORAL | Status: DC | PRN

## 2015-09-09 MED ORDER — LIDOCAINE-PRILOCAINE 2.5-2.5 % EX CREA: TOPICAL_CREAM | CUTANEOUS | Status: DC

## 2015-09-09 MED ORDER — ONDANSETRON HCL 8 MG PO TABS: 8.0000 mg | ORAL_TABLET | Freq: Two times a day (BID) | ORAL | Status: DC

## 2015-09-09 MED ORDER — DENOSUMAB 120 MG/1.7ML ~~LOC~~ SOLN
120.0000 mg | Freq: Once | SUBCUTANEOUS | Status: AC
  Administered 2015-09-09: 120 mg via SUBCUTANEOUS
  Filled 2015-09-09: qty 1.7

## 2015-09-09 NOTE — Progress Notes (Signed)
. ID: Maria Pruitt   DOB: June 22, 1946  MR#: [id number]  CHY#:[id number]  Maria Nova, PA-C GYN:  SU:  OTHER MD:  CHIEF COMPLAINT: left adrenal metastatsis  CURRENT TREATMENT: capecitabine, denosumab  BREAST CANCER HISTORY:  from the original intake note:  Maria Pruitt herself palpated a change in her left breast, shortly before her mammogram was due.  She brought this to [name] attention and he set her up for diagnostic mammography on October 31, 2009.   Routine and implant displaced views of both breasts showed a 2 cm irregular mass in the outer left breast, seen only on the spot tangential view.  The breast tissue itself was fatty.  There were no suspicious calcifications.  This mass was palpable to Dr. [name].  An ultrasound showed it to be 1.7 cm irregular and hypoechoic.  There were at least two enlarged left axillary lymph nodes identified.  Dr. [name] recommended biopsy which was performed the same day and showed ([id number]) and invasive breast cancer felt most likely to be ductal with lobular features.  The left axillary lymph node biopsy also showed the same tumor (similar morphologic findings) and both prognostic profiles showed the cancer to be strongly ER and PR positive with a low to borderline proliferation fraction (13 and 14%).  Neither mass showed amplification of Her-2 by CISH.  (The ratios were 1.1 and 0.85.)    With this information, the patient was referred to Dr. [name] and bilateral breast MRIs were obtained March 1st.  This showed several left axillary lymph nodes with cortical thickening, the largest one measuring 1.1 cm.  The mass itself measured 2.9 cm by MRI.  It was spiculated and irregular.  There was no evidence of contralateral disease.   She was treated neoadjuvantly with docetaxel and cyclophosphamide for 6 cycles completed in July 2011, after which she underwent left lumpectomy and axillary dissection August 2011 for a ypT1b yTN3 (14 positive lymph  nodes), Stage IIIC, grade 1 residual tumor. HER-2/neu was repeated, again not amplified. After completing local regional radiation October 2011 she started letrozole November 2011, continued to September 2016, which she was found to have metastatic disease   METASTATIC DISEASE HISTORY: From the 06/08/2015 summary:   Maria Pruitt returns today for a new problem accompanied by her sister Maria Pruitt. To summarize the recent history: She had a hemorrhagic stroke in January 2016 with some residuals. She has been living next door to her son in the [place] area since then.--In August she presented to her primary care physician, Dr. Thornton Papas, with a complaint of abdominal discomfort, nausea and vomiting. He treated her for a UTI w/o resolution and obtained a KUB in his office; this was nondiagnostic. Accordingly he set up the patient for CT scan of the abdomen and pelvis 05/06/2015. This showed the left adrenal gland to be enlarged to 7.5 x 4.0 cm. There was eccentric wall thickening of the gastric cardia. There were small lymph nodes in the upper abdomen and retroperitoneum. There was no liver involvement. The right adrenal gland was unremarkable. There was severe left kidney atrophy (congenital).  The patient was then set up for biopsy of the left adrenal mass on 05/30/2015. This showed Maria Pruitt [id number]) metastatic carcinoma which was cytokeratin 7 positive, with rare cytokeratin 20 positivity and was negative for gross cystic disease fluid protein. The prognostic panel showed this tumor to be estrogen and progesterone receptor negative, HER-2 negative, with an Maria Pruitt of 90-100%.   PET scan10/03/2015 which showed in additional to the  left adrenal mass, some regional lymph nodes and multiple bone lesions. There was no obvious primary tumor. She also had an EGD to evaluate a possible gastric primary suggested by the yearly a CT scan of the chest. This was normal. We also obtained tumor markerswhich showed a normal CA 19,a  mildly elevated CEA at 9.7,Maria Pruitt is significantly elevated CA-27-29 at 125. Given this data, the most likely interpretation is that we are dealing with an estrogen receptor negative recurrence of her earlier estrogen receptor positive breast cancer, now stage IV  Her subsequent history is as detailed below  INTERVAL HISTORY: Maria Pruitt returns today for follow-up of her metastatic triple negative breast cancer, accompanied by her son. Since her last visit, she has had a port placed and has attended chemotherapy school. She is to begin eribulin, given on day 1 and day 8 every 21 days, with neulasta onpro given on day 2 for granulocyte support   REVIEW OF SYSTEMS: Maria Pruitt's appetite has decreased. She has experienced more nausea with the pain to her left back and flank. She vomited twice. She is using zofran PRN. She continues on norco PRN. She is using miralax and colace for constipation. She had an episode of facial flushing and warmth, but couldn't check her temperature because her thermometer was broken. This broke with tylenol use. Her blood pressure was very high during this time, but is stable today. She has some shortness of breath, but denies chest pain, cough, or palpitations. She has no headaches, dizziness, or vision changes. A detailed review of systems is otherwise stable.   PAST MEDICAL HISTORY: Past Medical History  Diagnosis Date  . Hypertension   . Breast cancer ([place]) 2011    Stage 3  . Atrial fibrillation ([place])   . Hypercholesterolemia   . Abnormal ECG   . Allergy     seasonal  . Arthritis   . GERD (gastroesophageal reflux disease)   . 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 Seizures ([place])   . Stroke ([place])     dec 15  . Thyroid disease    1. Significant for pituitary adenoma which was removed in 1983 but recurred, requiring radiation and briefly some Parlodel.  She has been off treatment since 1985.  She has some acromegaly secondary to this tumor.    2. She is status post bilateral submuscular breast implants under Dr. Towanda Malkin in 1987.   3. She is status post C-section for her third child who was hydrocephalic.   4. She underwent rhinoplasty in 1984.   5. She has a history of hypertension. 6. History of mitral valve prolapse, but she says she has not been receiving antibiotics prior to dental or similar procedures.   7. History of hypothyroidism.   8. History of congenital absence of one kidney.   9. History of hyperlipidemia.  10. History of obesity.   11. History of renal stones.   12. History of fatty liver.   13. History of gout.   14. History of palpitations.   History of childhood asthma  PAST SURGICAL HISTORY: Past Surgical History  Procedure Laterality Date  . Cesarean section  1980  . Breast enhancement surgery  1987  . Umbilical hernia repair    . Tubal ligation    . Scar revision    . Pituitary surgery      adenoma  . Rhinoplasty  1985  . Breast lumpectomy Left   . Colonoscopy  FAMILY HISTORY Family History  Problem Relation Age of Onset  . Heart disease Mother   . Heart attack Father   . Heart attack Brother   . Colon cancer Neg Hx    The patient's father died from a myocardial infarction at the age of 75.  The patient's mother died from complications of congestive heart failure at the age of 24.  The patient had one brother who died from a myocardial infarction at the age of 64.  One of the brothers and two sisters are alive and well.  Sr. Maria Pruitt is an ICU nurse. There is no history of breast or ovarian cancer in the family to her knowledge.  GYNECOLOGIC HISTORY: She is GX P3, first pregnancy to term at age 69.  She went through the change of life at the time of her pituitary surgery in 1983.  She took hormone replacement for less than a year because of poor tolerance.    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  She  gave up her job in 08-12-2010.  Her first husband died from chronic myeloid leukemia in 1985.  Her three children from that marriage include her son Randall Hiss who died from complications of hydrocephaly at age 43; son [name] who is a Insurance underwriter and son [name] who is an Public house manager in this area.  The patient has five grandchildren.  Her second husband of 20+ years, Ron, is a Company secretary of an independent [employer] and also does Writer and is a Oceanographer. He is now disabled due toa traumatic brain injury. He lost his first wife to endometrial cancer.  He has a daughter from that marriage.  She lives in [place]: not in place  HEALTH MAINTENANCE: Social History  Substance Use Topics  . Smoking status: Never Smoker   . Smokeless tobacco: Never Used  . Alcohol Use: No     Colonoscopy:  PAP:  Bone density: January 2012/ osteopenia  Lipid panel:  Allergies  Allergen Reactions  . Ciprofloxacin Other (See Comments)  . Clonidine Derivatives Other (See Comments)  . Phenytoin     Other reaction(s): Other Elevated LFTs  . Sulfa Antibiotics Hives  . Benzonatate     REACTION: Swelling, tessalon perles  . Codeine     REACTION: Nausea    Current Outpatient Prescriptions  Medication Sig Dispense Refill  . acetaminophen (TYLENOL) 500 MG tablet Take 500 mg by mouth every 6 (six) hours as needed.    . ALPRAZolam (XANAX) 0.25 MG tablet Take 0.25 mg by mouth.    . Ascorbic Acid (VITAMIN C) 100 MG tablet Take 1,000 mg by mouth.    Marland Kitchen aspirin (ASPIRIN CHILDRENS) 81 MG chewable tablet Chew 1 tablet (81 mg total) by mouth daily. 100 tablet 4  . atorvastatin (LIPITOR) 40 MG tablet Take 1 tablet (40 mg total) by mouth daily. 90 tablet 4  . capecitabine (XELODA) 500 MG tablet Take 3 tablets (1,500 mg total) by mouth 2 (two) times daily after a meal. 42 tablet 6  . DANDELION PO Take by mouth.    . famotidine (PEPCID) 20 MG tablet Take 1 tablet (20 mg total) by mouth 2 (two) times  daily. 180 tablet 4  . hydrALAZINE (APRESOLINE) 10 MG tablet Take 1 tablet (10 mg total) by mouth 4 (four) times a day as needed (For SBP > 200 or DBP > 110).    Marland Kitchen  hydrochlorothiazide (HYDRODIURIL) 25 MG tablet Take 1 tablet (25 mg total) by mouth daily.    Marland Kitchen levothyroxine (SYNTHROID, LEVOTHROID) 100 MCG tablet Take 88 mcg by mouth daily before breakfast.    . losartan (COZAAR) 50 MG tablet Take 50 mg by mouth daily.    . metoprolol (LOPRESSOR) 50 MG tablet Take 50 mg by mouth 2 (two) times daily.    . ondansetron (ZOFRAN-ODT) 8 MG disintegrating tablet Take 1 tablet (8 mg total) by mouth 2 (two) times daily as needed for nausea or vomiting. 20 tablet 3  . oxyCODONE (OXY IR/ROXICODONE) 5 MG immediate release tablet Take 1 tablet (5 mg total) by mouth every 4 (four) hours as needed for severe pain. 30 tablet 0  . HYDROcodone-acetaminophen (NORCO/VICODIN) 5-325 MG tablet Take 1 tablet by mouth every 6 (six) hours as needed for moderate pain. (Patient not taking: Reported on 09/09/2015) 60 tablet 0  . lidocaine-prilocaine (EMLA) cream Apply to affected area once 30 g 3  . ondansetron (ZOFRAN) 8 MG tablet Take 1 tablet (8 mg total) by mouth 2 (two) times daily. Start the day after chemo for 2 days. Then take as needed for nausea or vomiting. 30 tablet 1  . prochlorperazine (COMPAZINE) 10 MG tablet Take 1 tablet (10 mg total) by mouth every 6 (six) hours as needed (Nausea or vomiting). 30 tablet 1  . traMADol (ULTRAM) 50 MG tablet Take 1 tablet (50 mg total) by mouth every 6 (six) hours as needed. (Patient not taking: Reported on 09/09/2015) 30 tablet 3   No current facility-administered medications for this visit.    OBJECTIVE: Middle-aged white woman in no acute distress Filed Vitals:   09/09/15 1106  BP: 120/59  Pulse: 58  Temp: 97.5 F (36.4 C)  Resp: 20     Body mass index is 28.94 kg/(m^2).    ECOG FS: 1  Skin: warm, dry  HEENT: sclerae anicteric, conjunctivae pink, oropharynx clear. No  thrush or mucositis.  Lymph Nodes: No cervical or supraclavicular lymphadenopathy  Lungs: clear to auscultation bilaterally, no rales, wheezes, or rhonci  Heart: regular rate and rhythm  Abdomen: round, soft, non tender, positive bowel sounds  Musculoskeletal: No focal spinal tenderness, no peripheral edema  Neuro: non focal, well oriented, positive affect  Breasts; Deferred  LAB RESULTS: CBC Latest Ref Rng 09/09/2015 09/08/2015 08/29/2015  WBC 3.9 - 10.3 10e3/uL 6.2 4.9 5.3  Hemoglobin 11.6 - 15.9 g/dL 14.5 13.4 13.4  Hematocrit 34.8 - 46.6 % 43.1 37.3 38.9  Platelets 145 - 400 10e3/uL 186 178 215     CMP Latest Ref Rng 08/29/2015 08/12/2015 07/18/2015  Glucose 70 - 140 mg/dl 87 118 95  BUN 7.0 - 26.0 mg/dL 16.5 19.6 17.7  Creatinine 0.6 - 1.1 mg/dL 1.0 0.9 0.8  Sodium 136 - 145 mEq/L 132(L) 134(L) 134(L)  Potassium 3.5 - 5.1 mEq/L 4.2 4.0 4.4  Chloride 96 - 112 mEq/L - - -  CO2 22 - 29 mEq/L 28 25 28   Calcium 8.4 - 10.4 mg/dL 8.8 9.0 9.8  Total Protein 6.4 - 8.3 g/dL 7.7 7.5 7.3  Total Bilirubin 0.20 - 1.20 mg/dL 1.38(H) 1.11 0.84  Alkaline Phos 40 - 150 U/L 183(H) 156(H) 154(H)  AST 5 - 34 U/L 45(H) 38(H) 36(H)  ALT 0 - 55 U/L 33 25 39    STUDIES: Mr Abdomen W Wo Contrast  08/24/2015  CLINICAL DATA:  69 year old female with history of metastatic breast cancer. Known bone metastasis, metastatic periaortic adenopathy in the  upper abdomen and left adrenal gland metastasis. EXAM: MRI ABDOMEN WITHOUT AND WITH CONTRAST TECHNIQUE: Multiplanar multisequence MR imaging of the abdomen was performed both before and after the administration of intravenous contrast. CONTRAST:  1m MULTIHANCE GADOBENATE DIMEGLUMINE 529 MG/ML IV SOLN COMPARISON:  PET-CT 06/17/2015. FINDINGS: Lower chest: Bilateral breast implants incidentally noted. Otherwise, unremarkable. Hepatobiliary: The liver has a nodular contour, suggestive of underlying cirrhosis. No discrete cystic or solid hepatic lesions are  identified. No intra or extrahepatic biliary ductal dilatation. Gallbladder is normal in appearance. Pancreas: No pancreatic mass. No pancreatic ductal dilatation. No pancreatic or peripancreatic fluid or inflammatory changes. Spleen: Unremarkable. Adrenals/Urinary Tract: 4.7 x 2.5 cm mass in the left adrenal gland demonstrates heterogeneous enhancement (image 39 of series 1303), compatible with a metastatic lesion. This lesion is inseparable from the adjacent left para-aortic lymphadenopathy. Right adrenal gland and right kidney are normal in appearance. Left kidney is not visualized, presumably surgically resected or congenitally absent (no susceptibility artifact from surgical clips are identified). No right-sided hydroureteronephrosis in the visualized abdomen. Stomach/Bowel: Visualized portions are unremarkable. Vascular/Lymphatic: No aneurysm identified in the visualized abdominal vasculature. Bulky of left-sided para-aortic lymphadenopathy again noted, measuring up to 4.7 x 3.9 cm (image 50 of series 1303), extending cephalad from the level of the aortic bifurcation to the upper retroperitoneum. Mildly enlarged right common iliac lymph node measuring 1 cm in short axis also noted. Other: No significant volume of ascites in the visualized peritoneal cavity. Musculoskeletal: Aggressive appearing lesion in the L2 vertebral body anteriorly is low T1 signal intensity, heterogeneously hyperintense T2 signal intensity, and avid enhancement, compatible with a metastatic lesion. IMPRESSION: 1. Bulky metastatic lymphadenopathy in the left para-aortic nodal stations and right common iliac station appears slightly increased in number and bulk compared to prior PET-CT 06/17/2015, presumably reflective of progression of metastatic adenopathy. 2. 4.7 x 2.5 cm left adrenal mass is unchanged, also likely metastatic. 3. Aggressive appearing osseous lesion in the L2 vertebral body is similar to the prior examination in terms of  size, also likely metastatic. 4. Left kidney is not visualized, either surgically absent or congenitally absent. Electronically Signed   By: DVinnie LangtonM.D.   On: 08/24/2015 08:18   Ir Fluoro Guide Cv Line Right  09/08/2015  CLINICAL DATA:  69year old female with a history of breast cancer. She has been referred for port catheter placement EXAM: IR RIGHT FLOURO GUIDE CV LINE; IR ULTRASOUND GUIDANCE VASC ACCESS RIGHT Date: 09/08/2015 ANESTHESIA/SEDATION: Moderate (conscious) sedation was administered during this procedure. A total of 4.0 mg Versed and 50 mg Fentanyl were administered intravenously. The patient's vital signs were monitored continuously by radiology nursing throughout the course of the procedure. Total sedation time: 25 minutes FLUOROSCOPY TIME:  18 second TECHNIQUE: The procedure, risks, benefits, and alternatives were explained to the patient. Questions regarding the procedure were encouraged and answered. The patient understands and consents to the procedure. Ultrasound survey was performed with images stored and sent to PACs. The right neck and chest was prepped with chlorhexidine, and draped in the usual sterile fashion using maximum barrier technique (cap and mask, sterile gown, sterile gloves, large sterile sheet, hand hygiene and cutaneous antiseptic). Antibiotic prophylaxis was provided with 2.0g Ancef administered IV one hour prior to skin incision. Local anesthesia was attained by infiltration with 1% lidocaine without epinephrine. Ultrasound demonstrated patency of the right internal jugular vein, and this was documented with an image. Under real-time ultrasound guidance, this vein was accessed with a 21 gauge micropuncture needle  and image documentation was performed. A small dermatotomy was made at the access site with an 11 scalpel. A 0.018" wire was advanced into the SVC and used to estimate the length of the internal catheter. The access needle exchanged for a 72F  micropuncture vascular sheath. The 0.018" wire was then removed and a 0.035" wire advanced into the IVC. An appropriate location for the subcutaneous reservoir was selected below the clavicle and an incision was made through the skin and underlying soft tissues. The subcutaneous tissues were then dissected using a combination of blunt and sharp surgical technique and a pocket was formed. A single lumen power injectable portacatheter was then tunneled through the subcutaneous tissues from the pocket to the dermatotomy and the port reservoir placed within the subcutaneous pocket. The venous access site was then serially dilated and a peel away vascular sheath placed over the wire. The wire was removed and the port catheter advanced into position under fluoroscopic guidance. The catheter tip is positioned in the cavoatrial junction. This was documented with a spot image. The portacatheter was then tested and found to flush and aspirate well. The port was flushed with saline followed by 100 units/mL heparinized saline. The pocket was then closed in two layers using first subdermal inverted interrupted absorbable sutures followed by a running subcuticular suture. The epidermis was then sealed with Dermabond. The dermatotomy at the venous access site was also seal with Dermabond. Patient tolerated the procedure well and remained hemodynamically stable throughout. No complications encountered and no significant blood loss encountered COMPLICATIONS: None.  The patient tolerated the procedure well. IMPRESSION: Status post placement of right IJ port.  Catheter ready for use. Signed, Dulcy Fanny. Earleen Newport, DO [facility] Rad[facility] Electronically Signed   By: Corrie Mckusick D.O.   On: 09/08/2015 18:38   Ir US Guide Vasc Access Right  09/08/2015  CLINICAL DATA:  69 year old female with a history of breast cancer. She has been referred for port catheter placement EXAM: IR RIGHT FLOURO  GUIDE CV LINE; IR ULTRASOUND GUIDANCE VASC ACCESS RIGHT Date: 09/08/2015 ANESTHESIA/SEDATION: Moderate (conscious) sedation was administered during this procedure. A total of 4.0 mg Versed and 50 mg Fentanyl were administered intravenously. The patient's vital signs were monitored continuously by radiology nursing throughout the course of the procedure. Total sedation time: 25 minutes FLUOROSCOPY TIME:  18 second TECHNIQUE: The procedure, risks, benefits, and alternatives were explained to the patient. Questions regarding the procedure were encouraged and answered. The patient understands and consents to the procedure. Ultrasound survey was performed with images stored and sent to PACs. The right neck and chest was prepped with chlorhexidine, and draped in the usual sterile fashion using maximum barrier technique (cap and mask, sterile gown, sterile gloves, large sterile sheet, hand hygiene and cutaneous antiseptic). Antibiotic prophylaxis was provided with 2.0g Ancef administered IV one hour prior to skin incision. Local anesthesia was attained by infiltration with 1% lidocaine without epinephrine. Ultrasound demonstrated patency of the right internal jugular vein, and this was documented with an image. Under real-time ultrasound guidance, this vein was accessed with a 21 gauge micropuncture needle and image documentation was performed. A small dermatotomy was made at the access site with an 11 scalpel. A 0.018" wire was advanced into the SVC and used to estimate the length of the internal catheter. The access needle exchanged for a 72F micropuncture vascular sheath. The 0.018" wire was then removed and a 0.035" wire advanced into the IVC. An appropriate location for  the subcutaneous reservoir was selected below the clavicle and an incision was made through the skin and underlying soft tissues. The subcutaneous tissues were then dissected using a combination of blunt and sharp surgical technique and a pocket was  formed. A single lumen power injectable portacatheter was then tunneled through the subcutaneous tissues from the pocket to the dermatotomy and the port reservoir placed within the subcutaneous pocket. The venous access site was then serially dilated and a peel away vascular sheath placed over the wire. The wire was removed and the port catheter advanced into position under fluoroscopic guidance. The catheter tip is positioned in the cavoatrial junction. This was documented with a spot image. The portacatheter was then tested and found to flush and aspirate well. The port was flushed with saline followed by 100 units/mL heparinized saline. The pocket was then closed in two layers using first subdermal inverted interrupted absorbable sutures followed by a running subcuticular suture. The epidermis was then sealed with Dermabond. The dermatotomy at the venous access site was also seal with Dermabond. Patient tolerated the procedure well and remained hemodynamically stable throughout. No complications encountered and no significant blood loss encountered COMPLICATIONS: None.  The patient tolerated the procedure well. IMPRESSION: Status post placement of right IJ port.  Catheter ready for use. Signed, Dulcy Fanny. Earleen Newport, DO [facility] Radiology Electronically Signed   By: Corrie Mckusick D.O.   On: 09/08/2015 18:38    ASSESSMENT: 69 y.o. [place], [place] woman  (1) status post left breast biopsy in February of 2011 for an invasive lobular carcinoma measuring 2.9 cm by MRI, with a positive prechemotherapy axillary lymph node biopsy, strongly estrogen and progesterone receptor positive with no evidence of HER-2/neu amplification and an Maria Pruitt of 14%.   (2) received 6 cycles of docetaxel/ cyclophosphamide in the neoadjuvant setting, completed in mid July of 2011   (3) s/p left lumpectomy and axillary lymph node dissection in August of 2011 for a ypT1b yTN3 (14  positive lymph nodes), Stage IIIC, grade 1 residual tumor. HER-2/neu was repeated, again not amplified.   (4) Completed loco-regional radiation in late October of 2011   (5) on letrozole starting early November 2011, continued to October 2016, with progression  METASTATIC DISEASE: September 2016: Involving left adrenal gland, regional nodes and bones  (6) biopsy of a 7.5 cm left adrenal mass 05/30/2015 showed metastatic carcinoma, estrogen, progesterone, and HER-2 negative, with an Maria Pruitt of 90-100%; the tumor cells were cytokeratin 7 positive, cytokeratin 20 focally positive, gross cystic disease fluid protein negative  (a) the CA-27-29 is informative  (b) PET scan 06/17/2015 shows, in addition to the left adrenal mass, some periaortic adenopathy and multiple hypermetabolic bone metastases (L2, T9, T2 and others)  (c) EGD on 06/16/2015 to evaluate suspicious cardiac wall thickening showed no evidence of malignancy  (7) capecitabine started 07/04/2015 at 1.5 g BID 7/7  (8) denosumab/ Xgeva started 07/18/2015, repeated monthly   OTHER PROBLEMS: (a) 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 She was given paper copies of the "roadmap" to share with her children. The prescriptions for these meds were sent to her pharmacy during this visit. She understands that we will be utilizing the neulasta onpro device on day 8 of treatment, and that she is to wear this  device until it is no longer active and the full dose has been administered.  We will complete 3 total cycles (9 weeks) before restaging scans are performed again.  Dystany will have her labs performed this afternoon prior to her xgeva injection. She understands and agrees with this plan. She knows the goal of treatment in her case is control. She  has been encouraged to call with any issues that might arise before her next visit here.  Laurie Panda, NP 09/09/2015 12:33 PM

## 2015-09-10 LAB — CANCER ANTIGEN 27.29: CA 27.29: 280 U/mL — AB (ref 0–39)

## 2015-09-13 ENCOUNTER — Other Ambulatory Visit: Payer: Self-pay | Admitting: *Deleted

## 2015-09-13 ENCOUNTER — Other Ambulatory Visit: Payer: Medicare Other

## 2015-09-13 ENCOUNTER — Ambulatory Visit: Payer: Medicare Other

## 2015-09-13 DIAGNOSIS — I959 Hypotension, unspecified: Secondary | ICD-10-CM

## 2015-09-13 MED ORDER — SODIUM CHLORIDE 0.9 % IV SOLN: Freq: Once | INTRAVENOUS | Status: DC

## 2015-09-13 NOTE — Progress Notes (Unsigned)
Patient here today for first time halaven.  Patient's BP was 81/44 initially. Per Dr. Jana Hakim he wanted patient to get fluids 530ml, recheck BP and give halaven if her BP was back to her normal baseline which is much higher.  This RN was unable to access her newly placed port x's 2 with Jan, Therapist, sports.  A peripheral line was attempted 5 times with no success.  Patient to go home and drink fluids.  She will be in tomorrow for BP check and possibly halaven.  Patient's BP upon leaving was 115/63. Val, RN to set up study for port.

## 2015-09-14 ENCOUNTER — Ambulatory Visit (HOSPITAL_BASED_OUTPATIENT_CLINIC_OR_DEPARTMENT_OTHER): Payer: Medicare Other

## 2015-09-14 ENCOUNTER — Other Ambulatory Visit: Payer: Self-pay | Admitting: Nurse Practitioner

## 2015-09-14 ENCOUNTER — Other Ambulatory Visit: Payer: Self-pay | Admitting: Oncology

## 2015-09-14 VITALS — BP 137/73 | HR 58 | Temp 96.9°F | Resp 18

## 2015-09-14 DIAGNOSIS — C50912 Malignant neoplasm of unspecified site of left female breast: Secondary | ICD-10-CM

## 2015-09-14 DIAGNOSIS — Z5111 Encounter for antineoplastic chemotherapy: Secondary | ICD-10-CM | POA: Diagnosis present

## 2015-09-14 DIAGNOSIS — C7951 Secondary malignant neoplasm of bone: Principal | ICD-10-CM

## 2015-09-14 DIAGNOSIS — C50412 Malignant neoplasm of upper-outer quadrant of left female breast: Secondary | ICD-10-CM

## 2015-09-14 MED ORDER — HEPARIN SOD (PORK) LOCK FLUSH 100 UNIT/ML IV SOLN
500.0000 [IU] | Freq: Once | INTRAVENOUS | Status: AC | PRN
  Administered 2015-09-14: 500 [IU]
  Filled 2015-09-14: qty 5

## 2015-09-14 MED ORDER — SODIUM CHLORIDE 0.9 % IV SOLN
Freq: Once | INTRAVENOUS | Status: AC
  Administered 2015-09-14: 15:00:00 via INTRAVENOUS

## 2015-09-14 MED ORDER — SODIUM CHLORIDE 0.9 % IV SOLN
Freq: Once | INTRAVENOUS | Status: AC
  Administered 2015-09-14: 16:00:00 via INTRAVENOUS
  Filled 2015-09-14: qty 4

## 2015-09-14 MED ORDER — SODIUM CHLORIDE 0.9 % IJ SOLN
10.0000 mL | INTRAMUSCULAR | Status: DC | PRN
  Administered 2015-09-14: 10 mL
  Filled 2015-09-14: qty 10

## 2015-09-14 MED ORDER — SODIUM CHLORIDE 0.9 % IV SOLN
1.4000 mg/m2 | Freq: Once | INTRAVENOUS | Status: AC
  Administered 2015-09-14: 2.6 mg via INTRAVENOUS
  Filled 2015-09-14: qty 5.2

## 2015-09-14 NOTE — Patient Instructions (Signed)
[facility] Discharge Instructions for Patients Receiving Chemotherapy  Today you received the following chemotherapy agents halaven   If you develop nausea and vomiting that is not controlled by your nausea medication, call the clinic.   BELOW ARE SYMPTOMS THAT SHOULD BE REPORTED IMMEDIATELY:  *FEVER GREATER THAN 100.5 F  *CHILLS WITH OR WITHOUT FEVER  NAUSEA AND VOMITING THAT IS NOT CONTROLLED WITH YOUR NAUSEA MEDICATION  *UNUSUAL SHORTNESS OF BREATH  *UNUSUAL BRUISING OR BLEEDING  TENDERNESS IN MOUTH AND THROAT WITH OR WITHOUT PRESENCE OF ULCERS  *URINARY PROBLEMS  *BOWEL PROBLEMS  UNUSUAL RASH Items with * indicate a potential emergency and should be followed up as soon as possible.  Feel free to call the clinic you have any questions or concerns. The clinic phone number is [phone].  Please show the [place] at check-in to the Emergency Department and triage nurse.

## 2015-09-19 ENCOUNTER — Ambulatory Visit: Payer: Medicare Other

## 2015-09-19 ENCOUNTER — Inpatient Hospital Stay (HOSPITAL_COMMUNITY): Admission: RE | Admit: 2015-09-19 | Payer: Medicare Other | Source: Ambulatory Visit

## 2015-09-19 ENCOUNTER — Other Ambulatory Visit: Payer: Self-pay | Admitting: *Deleted

## 2015-09-19 ENCOUNTER — Telehealth: Payer: Self-pay | Admitting: Oncology

## 2015-09-19 ENCOUNTER — Ambulatory Visit: Payer: Medicare Other | Admitting: Nurse Practitioner

## 2015-09-19 ENCOUNTER — Other Ambulatory Visit: Payer: Medicare Other

## 2015-09-19 NOTE — Telephone Encounter (Signed)
Per 1/9 pof r/s 1/9 appointments to 1/10. Spoke with patient she is aware - also confirmed other [place] appointment. Per patient she does not think she need port evaluation. Patient aware if would give message to desk nurse. Per desk nurse they will decide on this at visit tomorrow.

## 2015-09-20 ENCOUNTER — Ambulatory Visit (HOSPITAL_BASED_OUTPATIENT_CLINIC_OR_DEPARTMENT_OTHER): Payer: Medicare Other

## 2015-09-20 ENCOUNTER — Encounter: Payer: Self-pay | Admitting: Nurse Practitioner

## 2015-09-20 ENCOUNTER — Other Ambulatory Visit: Payer: Self-pay | Admitting: Oncology

## 2015-09-20 ENCOUNTER — Ambulatory Visit (HOSPITAL_BASED_OUTPATIENT_CLINIC_OR_DEPARTMENT_OTHER): Payer: Medicare Other | Admitting: Nurse Practitioner

## 2015-09-20 ENCOUNTER — Other Ambulatory Visit (HOSPITAL_BASED_OUTPATIENT_CLINIC_OR_DEPARTMENT_OTHER): Payer: Medicare Other

## 2015-09-20 VITALS — BP 104/79 | HR 90 | Temp 97.6°F | Resp 18 | Ht 64.0 in | Wt 169.3 lb

## 2015-09-20 DIAGNOSIS — C50912 Malignant neoplasm of unspecified site of left female breast: Secondary | ICD-10-CM | POA: Diagnosis not present

## 2015-09-20 DIAGNOSIS — C7951 Secondary malignant neoplasm of bone: Secondary | ICD-10-CM | POA: Diagnosis not present

## 2015-09-20 DIAGNOSIS — K30 Functional dyspepsia: Secondary | ICD-10-CM

## 2015-09-20 DIAGNOSIS — Z8673 Personal history of transient ischemic attack (TIA), and cerebral infarction without residual deficits: Secondary | ICD-10-CM

## 2015-09-20 DIAGNOSIS — Z5111 Encounter for antineoplastic chemotherapy: Secondary | ICD-10-CM | POA: Diagnosis present

## 2015-09-20 DIAGNOSIS — E860 Dehydration: Secondary | ICD-10-CM

## 2015-09-20 DIAGNOSIS — C50412 Malignant neoplasm of upper-outer quadrant of left female breast: Secondary | ICD-10-CM

## 2015-09-20 DIAGNOSIS — C7972 Secondary malignant neoplasm of left adrenal gland: Secondary | ICD-10-CM | POA: Diagnosis not present

## 2015-09-20 DIAGNOSIS — Z5189 Encounter for other specified aftercare: Secondary | ICD-10-CM | POA: Diagnosis not present

## 2015-09-20 LAB — CBC WITH DIFFERENTIAL/PLATELET
BASO%: 0.1 % (ref 0.0–2.0)
Basophils Absolute: 0 10*3/uL (ref 0.0–0.1)
EOS ABS: 0.1 10*3/uL (ref 0.0–0.5)
EOS%: 1.7 % (ref 0.0–7.0)
HCT: 37.7 % (ref 34.8–46.6)
HGB: 13.1 g/dL (ref 11.6–15.9)
LYMPH%: 32.5 % (ref 14.0–49.7)
MCH: 35.9 pg — ABNORMAL HIGH (ref 25.1–34.0)
MCHC: 34.7 g/dL (ref 31.5–36.0)
MCV: 103.6 fL — AB (ref 79.5–101.0)
MONO#: 0.3 10*3/uL (ref 0.1–0.9)
MONO%: 9.9 % (ref 0.0–14.0)
NEUT%: 55.8 % (ref 38.4–76.8)
NEUTROS ABS: 1.7 10*3/uL (ref 1.5–6.5)
Platelets: 220 10*3/uL (ref 145–400)
RBC: 3.64 10*6/uL — AB (ref 3.70–5.45)
RDW: 14.2 % (ref 11.2–14.5)
WBC: 3.1 10*3/uL — AB (ref 3.9–10.3)
lymph#: 1 10*3/uL (ref 0.9–3.3)

## 2015-09-20 LAB — COMPREHENSIVE METABOLIC PANEL
ALT: 107 U/L — ABNORMAL HIGH (ref 0–55)
AST: 74 U/L — ABNORMAL HIGH (ref 5–34)
Albumin: 3.3 g/dL — ABNORMAL LOW (ref 3.5–5.0)
Alkaline Phosphatase: 187 U/L — ABNORMAL HIGH (ref 40–150)
Anion Gap: 13 mEq/L — ABNORMAL HIGH (ref 3–11)
BILIRUBIN TOTAL: 2.22 mg/dL — AB (ref 0.20–1.20)
BUN: 18.3 mg/dL (ref 7.0–26.0)
CO2: 23 meq/L (ref 22–29)
Calcium: 8.5 mg/dL (ref 8.4–10.4)
Chloride: 91 mEq/L — ABNORMAL LOW (ref 98–109)
Creatinine: 1 mg/dL (ref 0.6–1.1)
EGFR: 58 mL/min/{1.73_m2} — AB (ref >90)
GLUCOSE: 124 mg/dL (ref 70–140)
POTASSIUM: 3.2 meq/L — AB (ref 3.5–5.1)
SODIUM: 127 meq/L — AB (ref 136–145)
TOTAL PROTEIN: 7 g/dL (ref 6.4–8.3)

## 2015-09-20 MED ORDER — SODIUM CHLORIDE 0.9 % IV SOLN
Freq: Once | INTRAVENOUS | Status: AC
  Administered 2015-09-20: 16:00:00 via INTRAVENOUS
  Filled 2015-09-20: qty 4

## 2015-09-20 MED ORDER — FAMOTIDINE IN NACL 20-0.9 MG/50ML-% IV SOLN
20.0000 mg | Freq: Two times a day (BID) | INTRAVENOUS | Status: DC
  Administered 2015-09-20: 20 mg via INTRAVENOUS

## 2015-09-20 MED ORDER — SODIUM CHLORIDE 0.9 % IJ SOLN
10.0000 mL | INTRAMUSCULAR | Status: DC | PRN
  Administered 2015-09-20: 10 mL
  Filled 2015-09-20: qty 10

## 2015-09-20 MED ORDER — FAMOTIDINE IN NACL 20-0.9 MG/50ML-% IV SOLN
INTRAVENOUS | Status: AC
  Filled 2015-09-20: qty 50

## 2015-09-20 MED ORDER — SODIUM CHLORIDE 0.9 % IV SOLN
Freq: Once | INTRAVENOUS | Status: AC
  Administered 2015-09-20: 16:00:00 via INTRAVENOUS

## 2015-09-20 MED ORDER — HEPARIN SOD (PORK) LOCK FLUSH 100 UNIT/ML IV SOLN
500.0000 [IU] | Freq: Once | INTRAVENOUS | Status: AC | PRN
  Administered 2015-09-20: 500 [IU]
  Filled 2015-09-20: qty 5

## 2015-09-20 MED ORDER — PEGFILGRASTIM 6 MG/0.6ML ~~LOC~~ PSKT
6.0000 mg | PREFILLED_SYRINGE | Freq: Once | SUBCUTANEOUS | Status: AC
  Administered 2015-09-20: 6 mg via SUBCUTANEOUS
  Filled 2015-09-20: qty 0.6

## 2015-09-20 MED ORDER — ERIBULIN MESYLATE CHEMO INJECTION 1 MG/2ML
2.2000 mg | Freq: Once | INTRAVENOUS | Status: AC
  Administered 2015-09-20: 2.2 mg via INTRAVENOUS
  Filled 2015-09-20: qty 4.4

## 2015-09-20 NOTE — Progress Notes (Signed)
. Maria Pruitt   DOB: [birth year]  MR#: [id number]  GDJ#:[id number]  Maria Nova, PA-C GYN:  SU:  OTHER MD:  CHIEF COMPLAINT: left adrenal metastatsis  CURRENT TREATMENT: capecitabine, denosumab  BREAST CANCER HISTORY:  from the original intake note:  Maria Pruitt herself palpated a change in her left breast, shortly before her mammogram was due.  She brought this to Dr. Wilder Pruitt attention and he set her up for diagnostic mammography on October 31, 2009.   Routine and implant displaced views of both breasts showed a 2 cm irregular mass in the outer left breast, seen only on the spot tangential view.  The breast tissue itself was fatty.  There were no suspicious calcifications.  This mass was palpable to Dr. Melanee Pruitt.  An ultrasound showed it to be 1.7 cm irregular and hypoechoic.  There were at least two enlarged left axillary lymph nodes identified.  Dr. Melanee Pruitt recommended biopsy which was performed the same day and showed ([id number]) and invasive breast cancer felt most likely to be ductal with lobular features.  The left axillary lymph node biopsy also showed the same tumor (similar morphologic findings) and both prognostic profiles showed the cancer to be strongly ER and PR positive with a low to borderline proliferation fraction (13 and 14%).  Neither mass showed amplification of Her-2 by CISH.  (The ratios were 1.1 and 0.85.)    With this information, the patient was referred to Dr. Margot Pruitt and bilateral breast MRIs were obtained March 1st.  This showed several left axillary lymph nodes with cortical thickening, the largest one measuring 1.1 cm.  The mass itself measured 2.9 cm by MRI.  It was spiculated and irregular.  There was no evidence of contralateral disease.   She was treated neoadjuvantly with docetaxel and cyclophosphamide for 6 cycles completed in July 2011, after which she underwent left lumpectomy and axillary dissection August 2011 for a ypT1b yTN3 (14 positive lymph  nodes), Stage IIIC, grade 1 residual tumor. HER-2/neu was repeated, again not amplified. After completing local regional radiation October 2011 she started letrozole November 2011, continued to September 2016, which she was found to have metastatic disease   METASTATIC DISEASE HISTORY: From the 06/08/2015 summary:   Maria Pruitt returns today for a new problem accompanied by her sister Maria Pruitt. To summarize the recent history: She had a hemorrhagic stroke in January 2016 with some residuals. She has been living next door to her son in the [place] area since then.--In August she presented to her primary care physician, Maria Pruitt, with a complaint of abdominal discomfort, nausea and vomiting. He treated her for a UTI w/o resolution and obtained a KUB in his office; this was nondiagnostic. Accordingly he set up the patient for CT scan of the abdomen and pelvis 05/06/2015. This showed the left adrenal gland to be enlarged to 7.5 x 4.0 cm. There was eccentric wall thickening of the gastric cardia. There were small lymph nodes in the upper abdomen and retroperitoneum. There was no liver involvement. The right adrenal gland was unremarkable. There was severe left kidney atrophy (congenital).  The patient was then set up for biopsy of the left adrenal mass on 05/30/2015. This showed Maria Pruitt [id number] metastatic carcinoma which was cytokeratin 7 positive, with rare cytokeratin 20 positivity and was negative for gross cystic disease fluid protein. The prognostic panel showed this tumor to be estrogen and progesterone receptor negative, HER-2 negative, with an MIB-1 of 90-100%.   PET scan10/03/2015 which showed in additional to the  left adrenal mass, some regional lymph nodes and multiple bone lesions. There was no obvious primary tumor. She also had an EGD to evaluate a possible gastric primary suggested by the yearly a CT scan of the chest. This was normal. We also obtained tumor markerswhich showed a normal CA 19,a  mildly elevated CEA at 9.7,Maria Pruitt is significantly elevated CA-27-29 at 125. Given this data, the most likely interpretation is that we are dealing with an estrogen receptor negative recurrence of her earlier estrogen receptor positive breast cancer, now stage IV  Her subsequent history is as detailed below  INTERVAL HISTORY: Maria Pruitt returns today for follow-up of her metastatic triple negative breast cancer, accompanied by her son Maria Pruitt. She is due for day 8, cycle 1 of eribulin, given on day 1 and day 8 of every 21 day cycle, with neulasta onpro given on day 2 for granulocyte support   REVIEW OF SYSTEMS: Miles had a rough weekend, she believes she has picked up a stomach virus from someone at church on Sunday. She vomited several times that day, despite zofran use. She is complaining of indigestion today, but forgot to take her pepcid this morning. She had a large bowel movement on Sunday night, but has been constipated since. She is on sennakot BID. Her appetite is down and she has only been eating Jello, but intends to start a protein supplement soon. She lost her balance on Sunday as well, as she felt weak, but she is on target with drinking at least 60oz of water daily. Surprising she denies pain, including the areas of chronic discomfort to her left flank and back. She denies fevers, chills, mouth sores, rashes, or neuropathy symptoms. A detailed review of systems is otherwise stable.  PAST MEDICAL HISTORY: Past Medical History  Diagnosis Date  . Hypertension   . Breast cancer ([place]) 2011    Stage 3  . Atrial fibrillation ([place])   . Hypercholesterolemia   . Abnormal ECG   . Allergy     seasonal  . Arthritis   . GERD (gastroesophageal reflux disease)   . 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 Seizures ([place])   . St[place] ([place])     dec 15  . Thyroid disease    1. Significant for pituitary adenoma which was removed in 1983 but recurred, requiring radiation and briefly  some Parlodel.  She has been off treatment since 1985.  She has some acromegaly secondary to this tumor.   2. She is status post bilateral submuscular breast implants under Dr. Towanda Malkin in 1987.   3. She is status post C-section for her third child who was hydrocephalic.   4. She underwent rhinoplasty in 1984.   5. She has a history of hypertension. 6. History of mitral valve prolapse, but she says she has not been receiving antibiotics prior to dental or similar procedures.   7. History of hypothyroidism.   8. History of congenital absence of one kidney.   9. History of hyperlipidemia.  10. History of obesity.   11. History of renal stones.   12. History of fatty liver.   13. History of gout.   14. History of palpitations.   History of childhood asthma  PAST SURGICAL HISTORY: Past Surgical History  Procedure Laterality Date  . Cesarean section  1980  . Breast enhancement surgery  1987  . Umbilical hernia repair    . Tubal ligation    . Scar revision    .  Pituitary surgery      adenoma  . Rhinoplasty  1985  . Breast lumpectomy Left   . Colonoscopy      FAMILY HISTORY Family History  Problem Relation Age of Onset  . Heart disease Mother   . Heart attack Father   . Heart attack Brother   . Colon cancer Neg Hx    The patient's father died from a myocardial infarction at the age of 90.  The patient's mother died from complications of congestive heart failure at the age of 40.  The patient had one brother who died from a myocardial infarction at the age of 67.  One of the brothers and two sisters are alive and well.  Sr. Maria Pruitt is an ICU nurse. There is no history of breast or ovarian cancer in the family to her knowledge.  GYNECOLOGIC HISTORY: She is GX P3, first pregnancy to term at age 30.  She went through the change of life at the time of her pituitary surgery in 1983.  She took hormone replacement for less than a year because of poor tolerance.    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  She gave up her job in 08/09/2010.  Her first husband died from chronic myeloid leukemia in 1985.  Her three children from that marriage include her son Randall Hiss who died from complications of hydrocephaly at age 27; son Maria Pruitt who is a Insurance underwriter and son [name] who is an Public house manager in this area.  The patient has five grandchildren.  Her second husband of 20+ years, Ron, is a Company secretary of an independent [employer] and also does Writer and is a Oceanographer. He is now disabled due toa traumatic brain injury. He lost his first wife to endometrial cancer.  He has a daughter from that marriage.  She lives in [place]: not in place  HEALTH MAINTENANCE: Social History  Substance Use Topics  . Smoking status: Never Smoker   . Smokeless tobacco: Never Used  . Alcohol Use: No     Colonoscopy:  PAP:  Bone density: January 2012/ osteopenia  Lipid panel:  Allergies  Allergen Reactions  . Ciprofloxacin Other (See Comments)  . Clonidine Derivatives Other (See Comments)  . Phenytoin     Other reaction(s): Other Elevated LFTs  . Sulfa Antibiotics Hives  . Benzonatate     REACTION: Swelling, tessalon perles  . Codeine     REACTION: Nausea    Current Outpatient Prescriptions  Medication Sig Dispense Refill  . Ascorbic Acid (VITAMIN C) 100 MG tablet Take 1,000 mg by mouth.    Marland Kitchen aspirin (ASPIRIN CHILDRENS) 81 MG chewable tablet Chew 1 tablet (81 mg total) by mouth daily. 100 tablet 4  . atorvastatin (LIPITOR) 40 MG tablet Take 1 tablet (40 mg total) by mouth daily. 90 tablet 4  . hydrochlorothiazide (HYDRODIURIL) 25 MG tablet Take 1 tablet (25 mg total) by mouth daily.    Marland Kitchen levothyroxine (SYNTHROID, LEVOTHROID) 100 MCG tablet Take 88 mcg by mouth daily before breakfast.    . lidocaine-prilocaine (EMLA) cream Apply to affected area once 30 g 3  . losartan  (COZAAR) 50 MG tablet Take 50 mg by mouth daily.    . metoprolol (LOPRESSOR) 50 MG tablet Take 50 mg by mouth 2 (two) times daily.    . ondansetron (ZOFRAN-ODT) 8 MG disintegrating tablet Take 1 tablet (8 mg total)  by mouth 2 (two) times daily as needed for nausea or vomiting. 20 tablet 3  . acetaminophen (TYLENOL) 500 MG tablet Take 500 mg by mouth every 6 (six) hours as needed. Reported on 09/20/2015    . ALPRAZolam (XANAX) 0.25 MG tablet Take 0.25 mg by mouth. Reported on 09/20/2015    . capecitabine (XELODA) 500 MG tablet Take 3 tablets (1,500 mg total) by mouth 2 (two) times daily after a meal. (Patient not taking: Reported on 09/20/2015) 42 tablet 6  . DANDELION PO Take by mouth. Reported on 09/20/2015    . famotidine (PEPCID) 20 MG tablet Take 1 tablet (20 mg total) by mouth 2 (two) times daily. (Patient not taking: Reported on 09/20/2015) 180 tablet 4  . hydrALAZINE (APRESOLINE) 10 MG tablet Reported on 09/20/2015    . HYDROcodone-acetaminophen (NORCO/VICODIN) 5-325 MG tablet Take 1 tablet by mouth every 6 (six) hours as needed for moderate pain. (Patient not taking: Reported on 09/09/2015) 60 tablet 0  . ondansetron (ZOFRAN) 8 MG tablet Take 1 tablet (8 mg total) by mouth 2 (two) times daily. Start the day after chemo for 2 days. Then take as needed for nausea or vomiting. (Patient not taking: Reported on 09/20/2015) 30 tablet 1  . oxyCODONE (OXY IR/ROXICODONE) 5 MG immediate release tablet Take 1 tablet (5 mg total) by mouth every 4 (four) hours as needed for severe pain. (Patient not taking: Reported on 09/20/2015) 30 tablet 0  . prochlorperazine (COMPAZINE) 10 MG tablet Take 1 tablet (10 mg total) by mouth every 6 (six) hours as needed (Nausea or vomiting). (Patient not taking: Reported on 09/20/2015) 30 tablet 1  . traMADol (ULTRAM) 50 MG tablet Take 1 tablet (50 mg total) by mouth every 6 (six) hours as needed. (Patient not taking: Reported on 09/09/2015) 30 tablet 3   Current  Facility-Administered Medications  Medication Dose Route Frequency Provider Last Rate Last Dose  . famotidine (PEPCID) IVPB 20 mg premix  20 mg Intravenous Q12H Laurie Panda, NP       Facility-Administered Medications Ordered in Other Visits  Medication Dose Route Frequency Provider Last Rate Last Dose  . 0.9 %  sodium chloride infusion   Intravenous Once Maria Cruel, MD        OBJECTIVE: Middle-aged white woman in no acute distress, seated in wheelchair Filed Vitals:   09/20/15 1522  BP: 104/79  Pulse: 90  Temp: 97.6 F (36.4 C)  Resp: 18     Body mass index is 29.05 kg/(m^2).    ECOG FS: 1  Skin: warm, dry  HEENT: sclerae anicteric, conjunctivae pink, oropharynx clear. No thrush or mucositis.  Lymph Nodes: No cervical or supraclavicular lymphadenopathy  Lungs: clear to auscultation bilaterally, no rales, wheezes, or rhonci  Heart: regular rate and rhythm  Abdomen: round, soft, non tender, positive bowel sounds  Musculoskeletal: No focal spinal tenderness, no peripheral edema  Neuro: non focal, well oriented, fatigued affect  Breasts; Deferred   LAB RESULTS: CBC Latest Ref Rng 09/20/2015 09/09/2015 09/08/2015  WBC 3.9 - 10.3 10e3/uL 3.1(L) 6.2 4.9  Hemoglobin 11.6 - 15.9 g/dL 13.1 14.5 13.4  Hematocrit 34.8 - 46.6 % 37.7 43.1 37.3  Platelets 145 - 400 10e3/uL 220 186 178     CMP Latest Ref Rng 09/20/2015 09/09/2015 08/29/2015  Glucose 70 - 140 mg/dl 124 102 87  BUN 7.0 - 26.0 mg/dL 18.3 10.8 16.5  Creatinine 0.6 - 1.1 mg/dL 1.0 1.0 1.0  Sodium 136 - 145 mEq/L 127(L) 130(L)  132(L)  Potassium 3.5 - 5.1 mEq/L 3.2(L) 4.8 4.2  Chloride 96 - 112 mEq/L - - -  CO2 22 - 29 mEq/L 23 27 28   Calcium 8.4 - 10.4 mg/dL 8.5 9.9 8.8  Total Protein 6.4 - 8.3 g/dL 7.0 8.4(H) 7.7  Total Bilirubin 0.20 - 1.20 mg/dL 2.22(H) 1.69(H) 1.38(H)  Alkaline Phos 40 - 150 U/L 187(H) 206(H) 183(H)  AST 5 - 34 U/L 74(H) 26 45(H)  ALT 0 - 55 U/L 107(H) 17 33    STUDIES: Mr Abdomen W Wo  Contrast  08/24/2015  CLINICAL DATA:  70 year old female with history of metastatic breast cancer. Known bone metastasis, metastatic periaortic adenopathy in the upper abdomen and left adrenal gland metastasis. EXAM: MRI ABDOMEN WITHOUT AND WITH CONTRAST TECHNIQUE: Multiplanar multisequence MR imaging of the abdomen was performed both before and after the administration of intravenous contrast. CONTRAST:  52m MULTIHANCE GADOBENATE DIMEGLUMINE 529 MG/ML IV SOLN COMPARISON:  PET-CT 06/17/2015. FINDINGS: Lower chest: Bilateral breast implants incidentally noted. Otherwise, unremarkable. Hepatobiliary: The liver has a nodular contour, suggestive of underlying cirrhosis. No discrete cystic or solid hepatic lesions are identified. No intra or extrahepatic biliary ductal dilatation. Gallbladder is normal in appearance. Pancreas: No pancreatic mass. No pancreatic ductal dilatation. No pancreatic or peripancreatic fluid or inflammatory changes. Spleen: Unremarkable. Adrenals/Urinary Tract: 4.7 x 2.5 cm mass in the left adrenal gland demonstrates heterogeneous enhancement (image 39 of series 1303), compatible with a metastatic lesion. This lesion is inseparable from the adjacent left para-aortic lymphadenopathy. Right adrenal gland and right kidney are normal in appearance. Left kidney is not visualized, presumably surgically resected or congenitally absent (no susceptibility artifact from surgical clips are identified). No right-sided hydroureteronephrosis in the visualized abdomen. Stomach/Bowel: Visualized portions are unremarkable. Vascular/Lymphatic: No aneurysm identified in the visualized abdominal vasculature. Bulky of left-sided para-aortic lymphadenopathy again noted, measuring up to 4.7 x 3.9 cm (image 50 of series 1303), extending cephalad from the level of the aortic bifurcation to the upper retroperitoneum. Mildly enlarged right common iliac lymph node measuring 1 cm in short axis also noted. Other: No  significant volume of ascites in the visualized peritoneal cavity. Musculoskeletal: Aggressive appearing lesion in the L2 vertebral body anteriorly is low T1 signal intensity, heterogeneously hyperintense T2 signal intensity, and avid enhancement, compatible with a metastatic lesion. IMPRESSION: 1. Bulky metastatic lymphadenopathy in the left para-aortic nodal stations and right common iliac station appears slightly increased in number and bulk compared to prior PET-CT 06/17/2015, presumably reflective of progression of metastatic adenopathy. 2. 4.7 x 2.5 cm left adrenal mass is unchanged, also likely metastatic. 3. Aggressive appearing osseous lesion in the L2 vertebral body is similar to the prior examination in terms of size, also likely metastatic. 4. Left kidney is not visualized, either surgically absent or congenitally absent. Electronically Signed   By: DVinnie LangtonM.D.   On: 08/24/2015 08:18   Ir Fluoro Guide Cv Line Right  09/08/2015  CLINICAL DATA:  70year old female with a history of breast cancer. She has been referred for port catheter placement EXAM: IR RIGHT FLOURO GUIDE CV LINE; IR ULTRASOUND GUIDANCE VASC ACCESS RIGHT Date: 09/08/2015 ANESTHESIA/SEDATION: Moderate (conscious) sedation was administered during this procedure. A total of 4.0 mg Versed and 50 mg Fentanyl were administered intravenously. The patient's vital signs were monitored continuously by radiology nursing throughout the course of the procedure. Total sedation time: 25 minutes FLUOROSCOPY TIME:  18 second TECHNIQUE: The procedure, risks, benefits, and alternatives were explained to the patient. Questions regarding  the procedure were encouraged and answered. The patient understands and consents to the procedure. Ultrasound survey was performed with images stored and sent to PACs. The right neck and chest was prepped with chlorhexidine, and draped in the usual sterile fashion using maximum barrier technique (cap and mask,  sterile gown, sterile gloves, large sterile sheet, hand hygiene and cutaneous antiseptic). Antibiotic prophylaxis was provided with 2.0g Ancef administered IV one hour prior to skin incision. Local anesthesia was attained by infiltration with 1% lidocaine without epinephrine. Ultrasound demonstrated patency of the right internal jugular vein, and this was documented with an image. Under real-time ultrasound guidance, this vein was accessed with a 21 gauge micropuncture needle and image documentation was performed. A small dermatotomy was made at the access site with an 11 scalpel. A 0.018" wire was advanced into the SVC and used to estimate the length of the internal catheter. The access needle exchanged for a 41F micropuncture vascular sheath. The 0.018" wire was then removed and a 0.035" wire advanced into the IVC. An appropriate location for the subcutaneous reservoir was selected below the clavicle and an incision was made through the skin and underlying soft tissues. The subcutaneous tissues were then dissected using a combination of blunt and sharp surgical technique and a pocket was formed. A single lumen power injectable portacatheter was then tunneled through the subcutaneous tissues from the pocket to the dermatotomy and the port reservoir placed within the subcutaneous pocket. The venous access site was then serially dilated and a peel away vascular sheath placed over the wire. The wire was removed and the port catheter advanced into position under fluoroscopic guidance. The catheter tip is positioned in the cavoatrial junction. This was documented with a spot image. The portacatheter was then tested and found to flush and aspirate well. The port was flushed with saline followed by 100 units/mL heparinized saline. The pocket was then closed in two layers using first subdermal inverted interrupted absorbable sutures followed by a running subcuticular suture. The epidermis was then sealed with Dermabond. The  dermatotomy at the venous access site was also seal with Dermabond. Patient tolerated the procedure well and remained hemodynamically stable throughout. No complications encountered and no significant blood loss encountered COMPLICATIONS: None.  The patient tolerated the procedure well. IMPRESSION: Status post placement of right IJ port.  Catheter ready for use. Signed, Dulcy Fanny. Earleen Newport, DO [facility] Radiology Electronically Signed   By: Corrie Mckusick D.O.   On: 09/08/2015 18:38   Ir US Guide Vasc Access Right  09/08/2015  CLINICAL DATA:  70 year old female with a history of breast cancer. She has been referred for port catheter placement EXAM: IR RIGHT FLOURO GUIDE CV LINE; IR ULTRASOUND GUIDANCE VASC ACCESS RIGHT Date: 09/08/2015 ANESTHESIA/SEDATION: Moderate (conscious) sedation was administered during this procedure. A total of 4.0 mg Versed and 50 mg Fentanyl were administered intravenously. The patient's vital signs were monitored continuously by radiology nursing throughout the course of the procedure. Total sedation time: 25 minutes FLUOROSCOPY TIME:  18 second TECHNIQUE: The procedure, risks, benefits, and alternatives were explained to the patient. Questions regarding the procedure were encouraged and answered. The patient understands and consents to the procedure. Ultrasound survey was performed with images stored and sent to PACs. The right neck and chest was prepped with chlorhexidine, and draped in the usual sterile fashion using maximum barrier technique (cap and mask, sterile gown, sterile gloves, large sterile sheet, hand hygiene and cutaneous antiseptic). Antibiotic prophylaxis was provided  with 2.0g Ancef administered IV one hour prior to skin incision. Local anesthesia was attained by infiltration with 1% lidocaine without epinephrine. Ultrasound demonstrated patency of the right internal jugular vein, and this was documented with an image.  Under real-time ultrasound guidance, this vein was accessed with a 21 gauge micropuncture needle and image documentation was performed. A small dermatotomy was made at the access site with an 11 scalpel. A 0.018" wire was advanced into the SVC and used to estimate the length of the internal catheter. The access needle exchanged for a 57F micropuncture vascular sheath. The 0.018" wire was then removed and a 0.035" wire advanced into the IVC. An appropriate location for the subcutaneous reservoir was selected below the clavicle and an incision was made through the skin and underlying soft tissues. The subcutaneous tissues were then dissected using a combination of blunt and sharp surgical technique and a pocket was formed. A single lumen power injectable portacatheter was then tunneled through the subcutaneous tissues from the pocket to the dermatotomy and the port reservoir placed within the subcutaneous pocket. The venous access site was then serially dilated and a peel away vascular sheath placed over the wire. The wire was removed and the port catheter advanced into position under fluoroscopic guidance. The catheter tip is positioned in the cavoatrial junction. This was documented with a spot image. The portacatheter was then tested and found to flush and aspirate well. The port was flushed with saline followed by 100 units/mL heparinized saline. The pocket was then closed in two layers using first subdermal inverted interrupted absorbable sutures followed by a running subcuticular suture. The epidermis was then sealed with Dermabond. The dermatotomy at the venous access site was also seal with Dermabond. Patient tolerated the procedure well and remained hemodynamically stable throughout. No complications encountered and no significant blood loss encountered COMPLICATIONS: None.  The patient tolerated the procedure well. IMPRESSION: Status post placement of right IJ port.  Catheter ready for use. Signed, Dulcy Fanny.  Earleen Newport, DO [facility] Radiology Electronically Signed   By: Corrie Mckusick D.O.   On: 09/08/2015 18:38    ASSESSMENT: 70 y.o. [place], [place] woman  (1) status post left breast biopsy in February of 2011 for an invasive lobular carcinoma measuring 2.9 cm by MRI, with a positive prechemotherapy axillary lymph node biopsy, strongly estrogen and progesterone receptor positive with no evidence of HER-2/neu amplification and an MIB-1 of 14%.   (2) received 6 cycles of docetaxel/ cyclophosphamide in the neoadjuvant setting, completed in mid July of 2011   (3) s/p left lumpectomy and axillary lymph node dissection in August of 2011 for a ypT1b yTN3 (14 positive lymph nodes), Stage IIIC, grade 1 residual tumor. HER-2/neu was repeated, again not amplified.   (4) Completed loco-regional radiation in late October of 2011   (5) on letrozole starting early November 2011, continued to October 2016, with progression  METASTATIC DISEASE: September 2016: Involving left adrenal gland, regional nodes and bones  (6) biopsy of a 7.5 cm left adrenal mass 05/30/2015 showed metastatic carcinoma, estrogen, progesterone, and HER-2 negative, with an MIB-1 of 90-100%; the tumor cells were cytokeratin 7 positive, cytokeratin 20 focally positive, gross cystic disease fluid protein negative  (a) the CA-27-29 is informative  (b) PET scan 06/17/2015 shows, in addition to the left adrenal mass, some periaortic adenopathy and multiple hypermetabolic bone metastases (L2, T9, T2 and others)  (c) EGD on 06/16/2015 to evaluate suspicious cardiac wall thickening showed no  evidence of malignancy  (7) capecitabine started 07/04/2015 at 1.5 g BID 7/7. 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 I reviewed the results of her liver enzymes with Dr. Jana Hakim. He recommends proceeding with day 8 of eribulin, but dropping the dose by 15% given her bilirubin of 2.22. I am having the infusion nurse run the IV fluids at 500 cc/hr as well, for rehydration. I suggested Mechille come in another day this week for more IV fluids, but she declined. She did ask for pepcid prior to her infusion, and I am happy to oblige.   Shanasia will return in 2 weeks for cycle 2 of treatment. She understands and agrees with this plan. She knows the goal of treatment in her case is control. She has been encouraged to call with any issues that might arise before her next visit here.   Laurie Panda, NP 09/20/2015 4:00 PM

## 2015-09-20 NOTE — Progress Notes (Signed)
Per Gentry Fitz, NP; OK to treat despite labs (Bil 2.2, AST 74, ALT 107)

## 2015-09-20 NOTE — Patient Instructions (Signed)
[facility] [place] Discharge Instructions for Patients Receiving Chemotherapy  Today you received the following chemotherapy agents Eribulin  To help prevent nausea and vomiting after your treatment, we encourage you to take your nausea medication Compazine 10mg  every 6 hours as needed.   If you develop nausea and vomiting that is not controlled by your nausea medication, call the clinic.   BELOW ARE SYMPTOMS THAT SHOULD BE REPORTED IMMEDIATELY:  *FEVER GREATER THAN 100.5 F  *CHILLS WITH OR WITHOUT FEVER  NAUSEA AND VOMITING THAT IS NOT CONTROLLED WITH YOUR NAUSEA MEDICATION  *UNUSUAL SHORTNESS OF BREATH  *UNUSUAL BRUISING OR BLEEDING  TENDERNESS IN MOUTH AND THROAT WITH OR WITHOUT PRESENCE OF ULCERS  *URINARY PROBLEMS  *BOWEL PROBLEMS  UNUSUAL RASH Items with * indicate a potential emergency and should be followed up as soon as possible.  Feel free to call the clinic you have any questions or concerns. The clinic phone number is [phone].  Please show the [place] at check-in to the Emergency Department and triage nurse.

## 2015-09-24 ENCOUNTER — Inpatient Hospital Stay (HOSPITAL_COMMUNITY): Payer: Medicare Other

## 2015-09-24 ENCOUNTER — Inpatient Hospital Stay (HOSPITAL_COMMUNITY)
Admission: EM | Admit: 2015-09-24 | Discharge: 2015-10-04 | DRG: 193 | Disposition: A | Discharge status: Home / Self Care | Payer: Medicare Other | Attending: Internal Medicine | Admitting: Internal Medicine

## 2015-09-24 ENCOUNTER — Emergency Department (HOSPITAL_COMMUNITY): Payer: Medicare Other

## 2015-09-24 ENCOUNTER — Encounter (HOSPITAL_COMMUNITY): Payer: Self-pay

## 2015-09-24 DIAGNOSIS — Z8249 Family history of ischemic heart disease and other diseases of the circulatory system: Secondary | ICD-10-CM | POA: Diagnosis not present

## 2015-09-24 DIAGNOSIS — R0600 Dyspnea, unspecified: Secondary | ICD-10-CM

## 2015-09-24 DIAGNOSIS — J101 Influenza due to other identified influenza virus with other respiratory manifestations: Secondary | ICD-10-CM | POA: Diagnosis present

## 2015-09-24 DIAGNOSIS — Z79891 Long term (current) use of opiate analgesic: Secondary | ICD-10-CM

## 2015-09-24 DIAGNOSIS — I480 Paroxysmal atrial fibrillation: Secondary | ICD-10-CM | POA: Diagnosis present

## 2015-09-24 DIAGNOSIS — I2690 Septic pulmonary embolism without acute cor pulmonale: Secondary | ICD-10-CM | POA: Diagnosis not present

## 2015-09-24 DIAGNOSIS — Z79899 Other long term (current) drug therapy: Secondary | ICD-10-CM | POA: Diagnosis not present

## 2015-09-24 DIAGNOSIS — G9340 Encephalopathy, unspecified: Secondary | ICD-10-CM | POA: Diagnosis not present

## 2015-09-24 DIAGNOSIS — Y95 Nosocomial condition: Secondary | ICD-10-CM | POA: Diagnosis present

## 2015-09-24 DIAGNOSIS — E878 Other disorders of electrolyte and fluid balance, not elsewhere classified: Secondary | ICD-10-CM | POA: Diagnosis present

## 2015-09-24 DIAGNOSIS — C773 Secondary and unspecified malignant neoplasm of axilla and upper limb lymph nodes: Secondary | ICD-10-CM | POA: Diagnosis not present

## 2015-09-24 DIAGNOSIS — R627 Adult failure to thrive: Secondary | ICD-10-CM | POA: Diagnosis present

## 2015-09-24 DIAGNOSIS — C7972 Secondary malignant neoplasm of left adrenal gland: Secondary | ICD-10-CM | POA: Diagnosis present

## 2015-09-24 DIAGNOSIS — E876 Hypokalemia: Secondary | ICD-10-CM | POA: Diagnosis present

## 2015-09-24 DIAGNOSIS — I4891 Unspecified atrial fibrillation: Secondary | ICD-10-CM | POA: Diagnosis present

## 2015-09-24 DIAGNOSIS — R112 Nausea with vomiting, unspecified: Secondary | ICD-10-CM | POA: Diagnosis present

## 2015-09-24 DIAGNOSIS — Z8673 Personal history of transient ischemic attack (TIA), and cerebral infarction without residual deficits: Secondary | ICD-10-CM

## 2015-09-24 DIAGNOSIS — Z9882 Breast implant status: Secondary | ICD-10-CM | POA: Diagnosis not present

## 2015-09-24 DIAGNOSIS — I2699 Other pulmonary embolism without acute cor pulmonale: Secondary | ICD-10-CM | POA: Diagnosis present

## 2015-09-24 DIAGNOSIS — J189 Pneumonia, unspecified organism: Secondary | ICD-10-CM | POA: Diagnosis not present

## 2015-09-24 DIAGNOSIS — J181 Lobar pneumonia, unspecified organism: Secondary | ICD-10-CM

## 2015-09-24 DIAGNOSIS — T451X5A Adverse effect of antineoplastic and immunosuppressive drugs, initial encounter: Secondary | ICD-10-CM | POA: Diagnosis present

## 2015-09-24 DIAGNOSIS — R945 Abnormal results of liver function studies: Secondary | ICD-10-CM

## 2015-09-24 DIAGNOSIS — J09X1 Influenza due to identified novel influenza A virus with pneumonia: Secondary | ICD-10-CM | POA: Diagnosis present

## 2015-09-24 DIAGNOSIS — R609 Edema, unspecified: Secondary | ICD-10-CM

## 2015-09-24 DIAGNOSIS — C50912 Malignant neoplasm of unspecified site of left female breast: Secondary | ICD-10-CM | POA: Diagnosis not present

## 2015-09-24 DIAGNOSIS — C7951 Secondary malignant neoplasm of bone: Secondary | ICD-10-CM | POA: Diagnosis present

## 2015-09-24 DIAGNOSIS — D708 Other neutropenia: Secondary | ICD-10-CM

## 2015-09-24 DIAGNOSIS — E871 Hypo-osmolality and hyponatremia: Secondary | ICD-10-CM | POA: Diagnosis present

## 2015-09-24 DIAGNOSIS — R932 Abnormal findings on diagnostic imaging of liver and biliary tract: Secondary | ICD-10-CM | POA: Diagnosis not present

## 2015-09-24 DIAGNOSIS — K7469 Other cirrhosis of liver: Secondary | ICD-10-CM | POA: Diagnosis present

## 2015-09-24 DIAGNOSIS — E7800 Pure hypercholesterolemia, unspecified: Secondary | ICD-10-CM | POA: Diagnosis present

## 2015-09-24 DIAGNOSIS — D696 Thrombocytopenia, unspecified: Secondary | ICD-10-CM | POA: Diagnosis present

## 2015-09-24 DIAGNOSIS — R5081 Fever presenting with conditions classified elsewhere: Secondary | ICD-10-CM | POA: Diagnosis not present

## 2015-09-24 DIAGNOSIS — D709 Neutropenia, unspecified: Secondary | ICD-10-CM | POA: Diagnosis not present

## 2015-09-24 DIAGNOSIS — K1230 Oral mucositis (ulcerative), unspecified: Secondary | ICD-10-CM | POA: Diagnosis present

## 2015-09-24 DIAGNOSIS — C50919 Malignant neoplasm of unspecified site of unspecified female breast: Secondary | ICD-10-CM | POA: Diagnosis present

## 2015-09-24 DIAGNOSIS — I10 Essential (primary) hypertension: Secondary | ICD-10-CM | POA: Diagnosis present

## 2015-09-24 DIAGNOSIS — E860 Dehydration: Secondary | ICD-10-CM | POA: Diagnosis present

## 2015-09-24 DIAGNOSIS — Q600 Renal agenesis, unilateral: Secondary | ICD-10-CM

## 2015-09-24 DIAGNOSIS — E872 Acidosis: Secondary | ICD-10-CM | POA: Diagnosis present

## 2015-09-24 DIAGNOSIS — R740 Nonspecific elevation of levels of transaminase and lactic acid dehydrogenase [LDH]: Secondary | ICD-10-CM

## 2015-09-24 DIAGNOSIS — R0602 Shortness of breath: Secondary | ICD-10-CM

## 2015-09-24 DIAGNOSIS — R509 Fever, unspecified: Secondary | ICD-10-CM

## 2015-09-24 DIAGNOSIS — C50412 Malignant neoplasm of upper-outer quadrant of left female breast: Secondary | ICD-10-CM | POA: Diagnosis present

## 2015-09-24 DIAGNOSIS — D638 Anemia in other chronic diseases classified elsewhere: Secondary | ICD-10-CM | POA: Diagnosis present

## 2015-09-24 DIAGNOSIS — N179 Acute kidney failure, unspecified: Secondary | ICD-10-CM | POA: Diagnosis present

## 2015-09-24 DIAGNOSIS — K1231 Oral mucositis (ulcerative) due to antineoplastic therapy: Secondary | ICD-10-CM | POA: Diagnosis present

## 2015-09-24 DIAGNOSIS — D61810 Antineoplastic chemotherapy induced pancytopenia: Secondary | ICD-10-CM | POA: Diagnosis present

## 2015-09-24 DIAGNOSIS — Z6830 Body mass index (BMI) 30.0-30.9, adult: Secondary | ICD-10-CM

## 2015-09-24 DIAGNOSIS — A419 Sepsis, unspecified organism: Secondary | ICD-10-CM | POA: Diagnosis present

## 2015-09-24 DIAGNOSIS — K7460 Unspecified cirrhosis of liver: Secondary | ICD-10-CM | POA: Diagnosis not present

## 2015-09-24 DIAGNOSIS — D630 Anemia in neoplastic disease: Secondary | ICD-10-CM | POA: Diagnosis present

## 2015-09-24 DIAGNOSIS — I481 Persistent atrial fibrillation: Secondary | ICD-10-CM | POA: Diagnosis not present

## 2015-09-24 DIAGNOSIS — G252 Other specified forms of tremor: Secondary | ICD-10-CM | POA: Diagnosis present

## 2015-09-24 DIAGNOSIS — I482 Chronic atrial fibrillation: Secondary | ICD-10-CM | POA: Diagnosis not present

## 2015-09-24 DIAGNOSIS — R7989 Other specified abnormal findings of blood chemistry: Secondary | ICD-10-CM

## 2015-09-24 DIAGNOSIS — R7401 Elevation of levels of liver transaminase levels: Secondary | ICD-10-CM | POA: Diagnosis present

## 2015-09-24 LAB — CBC WITH DIFFERENTIAL/PLATELET
Basophils Absolute: 0 10*3/uL (ref 0.0–0.1)
Basophils Relative: 0 %
EOS PCT: 0 %
Eosinophils Absolute: 0 10*3/uL (ref 0.0–0.7)
HEMATOCRIT: 30.7 % — AB (ref 36.0–46.0)
Hemoglobin: 11.1 g/dL — ABNORMAL LOW (ref 12.0–15.0)
LYMPHS ABS: 0.3 10*3/uL — AB (ref 0.7–4.0)
Lymphocytes Relative: 27 %
MCH: 35.7 pg — ABNORMAL HIGH (ref 26.0–34.0)
MCHC: 36.2 g/dL — AB (ref 30.0–36.0)
MCV: 98.7 fL (ref 78.0–100.0)
MONOS PCT: 14 %
Monocytes Absolute: 0.2 10*3/uL (ref 0.1–1.0)
NEUTROS ABS: 0.7 10*3/uL — AB (ref 1.7–7.7)
Neutrophils Relative %: 59 %
Platelets: 182 10*3/uL (ref 150–400)
RBC: 3.11 MIL/uL — ABNORMAL LOW (ref 3.87–5.11)
RDW: 13.8 % (ref 11.5–15.5)
WBC: 1.2 10*3/uL — CL (ref 4.0–10.5)

## 2015-09-24 LAB — COMPREHENSIVE METABOLIC PANEL
ALT: 123 U/L — ABNORMAL HIGH (ref 14–54)
ANION GAP: 13 (ref 5–15)
AST: 127 U/L — AB (ref 15–41)
Albumin: 3.1 g/dL — ABNORMAL LOW (ref 3.5–5.0)
Alkaline Phosphatase: 198 U/L — ABNORMAL HIGH (ref 38–126)
BILIRUBIN TOTAL: 4.2 mg/dL — AB (ref 0.3–1.2)
BUN: 13 mg/dL (ref 6–20)
CALCIUM: 7.9 mg/dL — AB (ref 8.9–10.3)
CO2: 25 mmol/L (ref 22–32)
CREATININE: 1.51 mg/dL — AB (ref 0.44–1.00)
Chloride: 93 mmol/L — ABNORMAL LOW (ref 101–111)
GFR calc Af Amer: 40 mL/min — ABNORMAL LOW (ref >60)
GFR, EST NON AFRICAN AMERICAN: 35 mL/min — AB (ref >60)
Glucose, Bld: 252 mg/dL — ABNORMAL HIGH (ref 65–99)
POTASSIUM: 2.6 mmol/L — AB (ref 3.5–5.1)
Sodium: 131 mmol/L — ABNORMAL LOW (ref 135–145)
Total Protein: 6.1 g/dL — ABNORMAL LOW (ref 6.5–8.1)

## 2015-09-24 LAB — I-STAT CG4 LACTIC ACID, ED: LACTIC ACID, VENOUS: 3.29 mmol/L — AB (ref 0.5–2.0)

## 2015-09-24 LAB — MAGNESIUM: Magnesium: 0.9 mg/dL — CL (ref 1.7–2.4)

## 2015-09-24 LAB — TSH: TSH: 0.066 u[IU]/mL — AB (ref 0.350–4.500)

## 2015-09-24 LAB — APTT: APTT: 30 s (ref 24–37)

## 2015-09-24 LAB — AMMONIA: Ammonia: 43 umol/L — ABNORMAL HIGH (ref 9–35)

## 2015-09-24 LAB — PROCALCITONIN: PROCALCITONIN: 0.98 ng/mL

## 2015-09-24 LAB — PROTIME-INR
INR: 1.2 (ref 0.00–1.49)
PROTHROMBIN TIME: 15.3 s — AB (ref 11.6–15.2)

## 2015-09-24 LAB — PHOSPHORUS: Phosphorus: 1.1 mg/dL — ABNORMAL LOW (ref 2.5–4.6)

## 2015-09-24 LAB — LACTIC ACID, PLASMA: Lactic Acid, Venous: 2.1 mmol/L (ref 0.5–2.0)

## 2015-09-24 MED ORDER — SODIUM CHLORIDE 0.9 % IV BOLUS (SEPSIS)
1000.0000 mL | Freq: Once | INTRAVENOUS | Status: AC
Start: 2015-09-24 — End: 2015-09-24
  Administered 2015-09-24: 1000 mL via INTRAVENOUS

## 2015-09-24 MED ORDER — SODIUM CHLORIDE 0.9 % IJ SOLN
3.0000 mL | Freq: Two times a day (BID) | INTRAMUSCULAR | Status: DC
  Administered 2015-09-25 – 2015-10-01 (×6): 3 mL via INTRAVENOUS

## 2015-09-24 MED ORDER — SODIUM CHLORIDE 0.9 % IV BOLUS (SEPSIS)
250.0000 mL | Freq: Once | INTRAVENOUS | Status: AC
  Administered 2015-09-25: 250 mL via INTRAVENOUS

## 2015-09-24 MED ORDER — METOPROLOL TARTRATE 50 MG PO TABS
50.0000 mg | ORAL_TABLET | Freq: Two times a day (BID) | ORAL | Status: DC
  Administered 2015-09-25 – 2015-09-26 (×2): 50 mg via ORAL
  Filled 2015-09-24 (×4): qty 1

## 2015-09-24 MED ORDER — DEXTROSE 5 % IV SOLN
2.0000 g | Freq: Once | INTRAVENOUS | Status: AC
  Administered 2015-09-24: 2 g via INTRAVENOUS
  Filled 2015-09-24: qty 2

## 2015-09-24 MED ORDER — ONDANSETRON HCL 4 MG/2ML IJ SOLN
4.0000 mg | Freq: Once | INTRAMUSCULAR | Status: AC
  Administered 2015-09-24: 4 mg via INTRAVENOUS
  Filled 2015-09-24: qty 2

## 2015-09-24 MED ORDER — VANCOMYCIN HCL IN DEXTROSE 1-5 GM/200ML-% IV SOLN
1000.0000 mg | INTRAVENOUS | Status: DC
  Administered 2015-09-25: 1000 mg via INTRAVENOUS
  Filled 2015-09-24: qty 200

## 2015-09-24 MED ORDER — ONDANSETRON HCL 4 MG PO TABS: 4.0000 mg | ORAL_TABLET | Freq: Four times a day (QID) | ORAL | Status: DC | PRN

## 2015-09-24 MED ORDER — PROCHLORPERAZINE EDISYLATE 5 MG/ML IJ SOLN
10.0000 mg | Freq: Four times a day (QID) | INTRAMUSCULAR | Status: DC | PRN
  Administered 2015-09-25 (×2): 10 mg via INTRAVENOUS
  Filled 2015-09-24 (×2): qty 2

## 2015-09-24 MED ORDER — SODIUM CHLORIDE 0.9 % IV SOLN
INTRAVENOUS | Status: DC
  Administered 2015-09-24: 75 mL/h via INTRAVENOUS

## 2015-09-24 MED ORDER — POTASSIUM PHOSPHATES 15 MMOLE/5ML IV SOLN
20.0000 meq | Freq: Once | INTRAVENOUS | Status: AC
  Administered 2015-09-25 (×2): 20 meq via INTRAVENOUS
  Filled 2015-09-24: qty 4.55

## 2015-09-24 MED ORDER — HYDROCODONE-ACETAMINOPHEN 5-325 MG PO TABS
1.0000 | ORAL_TABLET | ORAL | Status: DC | PRN
  Administered 2015-09-25: 1 via ORAL
  Filled 2015-09-24: qty 1

## 2015-09-24 MED ORDER — VANCOMYCIN HCL IN DEXTROSE 1-5 GM/200ML-% IV SOLN
1000.0000 mg | Freq: Once | INTRAVENOUS | Status: AC
  Administered 2015-09-24: 1000 mg via INTRAVENOUS
  Filled 2015-09-24: qty 200

## 2015-09-24 MED ORDER — SODIUM CHLORIDE 0.9 % IJ SOLN
10.0000 mL | INTRAMUSCULAR | Status: DC | PRN
Start: 2015-09-24 — End: 2015-10-04
  Administered 2015-09-26: 20 mL
  Administered 2015-09-26 – 2015-10-04 (×8): 10 mL
  Filled 2015-09-24 (×9): qty 40

## 2015-09-24 MED ORDER — SODIUM CHLORIDE 0.9 % IV BOLUS (SEPSIS): 1000.0000 mL | Freq: Once | INTRAVENOUS | Status: DC

## 2015-09-24 MED ORDER — MAGNESIUM SULFATE 2 GM/50ML IV SOLN
2.0000 g | Freq: Once | INTRAVENOUS | Status: AC
  Administered 2015-09-25: 2 g via INTRAVENOUS
  Filled 2015-09-24: qty 50

## 2015-09-24 MED ORDER — LEVOTHYROXINE SODIUM 88 MCG PO TABS
88.0000 ug | ORAL_TABLET | Freq: Every day | ORAL | Status: DC
  Administered 2015-09-25 – 2015-10-04 (×10): 88 ug via ORAL
  Filled 2015-09-24 (×10): qty 1

## 2015-09-24 MED ORDER — SODIUM CHLORIDE 0.9 % IV BOLUS (SEPSIS): 1500.0000 mL | Freq: Once | INTRAVENOUS | Status: DC

## 2015-09-24 MED ORDER — SODIUM CHLORIDE 0.9 % IV SOLN
Freq: Once | INTRAVENOUS | Status: AC
  Administered 2015-09-24: 16:00:00 via INTRAVENOUS

## 2015-09-24 MED ORDER — PIPERACILLIN-TAZOBACTAM 3.375 G IVPB 30 MIN: 3.3750 g | Freq: Once | INTRAVENOUS | Status: DC

## 2015-09-24 MED ORDER — VANCOMYCIN HCL IN DEXTROSE 1-5 GM/200ML-% IV SOLN: 1000.0000 mg | Freq: Once | INTRAVENOUS | Status: DC

## 2015-09-24 MED ORDER — ONDANSETRON HCL 4 MG/2ML IJ SOLN
4.0000 mg | Freq: Four times a day (QID) | INTRAMUSCULAR | Status: DC | PRN
  Administered 2015-09-25 – 2015-10-03 (×3): 4 mg via INTRAVENOUS
  Filled 2015-09-24 (×3): qty 2

## 2015-09-24 MED ORDER — ALPRAZOLAM 0.25 MG PO TABS: 0.2500 mg | ORAL_TABLET | Freq: Two times a day (BID) | ORAL | Status: DC | PRN

## 2015-09-24 MED ORDER — HYDRALAZINE HCL 10 MG PO TABS
10.0000 mg | ORAL_TABLET | Freq: Three times a day (TID) | ORAL | Status: DC
  Filled 2015-09-24 (×7): qty 1

## 2015-09-24 MED ORDER — SODIUM CHLORIDE 0.9 % IJ SOLN
10.0000 mL | Freq: Two times a day (BID) | INTRAMUSCULAR | Status: DC
  Administered 2015-09-29: 10 mL

## 2015-09-24 MED ORDER — PIPERACILLIN-TAZOBACTAM 3.375 G IVPB
3.3750 g | Freq: Three times a day (TID) | INTRAVENOUS | Status: DC
  Administered 2015-09-24 – 2015-09-29 (×15): 3.375 g via INTRAVENOUS
  Filled 2015-09-24 (×15): qty 50

## 2015-09-24 MED ORDER — ACETAMINOPHEN 325 MG PO TABS
650.0000 mg | ORAL_TABLET | Freq: Once | ORAL | Status: AC
  Administered 2015-09-24: 650 mg via ORAL
  Filled 2015-09-24: qty 2

## 2015-09-24 MED ORDER — POTASSIUM CHLORIDE 10 MEQ/100ML IV SOLN
10.0000 meq | INTRAVENOUS | Status: AC
  Administered 2015-09-24 (×3): 10 meq via INTRAVENOUS
  Filled 2015-09-24 (×3): qty 100

## 2015-09-24 NOTE — Progress Notes (Signed)
ANTIBIOTIC CONSULT NOTE - INITIAL  Pharmacy Consult for vancomycin/Zosyn Indication: sepsis due to PNA/febrile neutropenia  Allergies  Allergen Reactions  . Ciprofloxacin Other (See Comments)  . Clonidine Derivatives Other (See Comments)  . Phenytoin     Other reaction(s): Other Elevated LFTs  . Sulfa Antibiotics Hives  . Benzonatate     REACTION: Swelling, tessalon perles  . Codeine     REACTION: Nausea    Patient Measurements:   Adjusted Body Weight:   Vital Signs: Temp: 100.7 F (38.2 C) (01/14 1510) Temp Source: Oral (01/14 1510) BP: 106/53 mmHg (01/14 1840) Pulse Rate: 90 (01/14 1840) Intake/Output from previous day:   Intake/Output from this shift:    Labs:  Recent Labs  09/24/15 1530  WBC 1.2*  HGB 11.1*  PLT 182  CREATININE 1.51*   Estimated Creatinine Clearance: 36.2 mL/min (by C-G formula based on Cr of 1.51). No results for input(s): VANCOTROUGH, VANCOPEAK, VANCORANDOM, GENTTROUGH, GENTPEAK, GENTRANDOM, TOBRATROUGH, TOBRAPEAK, TOBRARND, AMIKACINPEAK, AMIKACINTROU, AMIKACIN in the last 72 hours.   Microbiology: Recent Results (from the past 720 hour(s))  Urine culture     Status: None   Collection Time: 09/08/15 10:10 AM  Result Value Ref Range Status   Urine Culture, Routine Culture, Urine  Final    Comment: Final - ===== COLONY COUNT: ===== 4,000 COLONIES/ML Insignificant Growth     Medical History: Past Medical History  Diagnosis Date  . Hypertension   . Breast cancer ([place]) 2011    Stage 3  . Atrial fibrillation ([place])   . Hypercholesterolemia   . Abnormal ECG   . Allergy     seasonal  . Arthritis   . GERD (gastroesophageal reflux disease)   . 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 Seizures ([place])   . Stroke ([place])     dec 15  . Thyroid disease     Medications:  Scheduled:  . hydrALAZINE  10 mg Oral 3 times per day  . [START ON 09/25/2015] levothyroxine  88 mcg Oral QAC breakfast  . metoprolol  50 mg Oral  BID  . piperacillin-tazobactam (ZOSYN)  IV  3.375 g Intravenous Q8H  . potassium chloride  10 mEq Intravenous Q1 Hr x 3  . sodium chloride  1,500 mL Intravenous Once  . sodium chloride  3 mL Intravenous Q12H  . [START ON 09/25/2015] vancomycin  1,000 mg Intravenous Q24H   Assessment: Patient is 70 year old female with invasive lobular breast carcinoma metastatic to left adrenal gland, regional nodes and bones, follows with Dr. Jana Hakim, patient started treatment 8 weeks prior to this admission currently on daily oral chemotherapy, started IV chemotherapy 2 weeks prior to this admission and gets these treatments every 2 weeks. She now presents to [facility] emergency department with several days duration of progressively worsening weakness, nausea and nonbloody vomiting, poor oral intake and fatigue. Patient also reports subjective fevers and chills, cough mixed nonproductive and productive clear sputum. Pt diagnosed with sepsis secondary to PNA and febrile neutropenia.  1/14: SCr 1.51, CrCl: 36 ml/min C-G   1/14 >> vancomycin>> 1/14 >> Zosyn >>    Cultures: 1/14 blood x 2 : 1/14 urine: 1/14: sputum    Goal of Therapy:  Vancomycin trough level 15-20 mcg/ml  Monitor clinical course Follow up culture results  Plan:  Follow up culture results  Vancomycin 1000 mg IV q24h Zosyn 3.375 gr IV q8h EI   Royetta Asal, PharmD, BCPS Pager [phone] 09/24/2015 7:19 PM

## 2015-09-24 NOTE — ED Notes (Signed)
She has felt weak all of this week; unable to do chemo. Monday, but was able to do chemo. Tues. At which time her Na+ was 127.  She is persistently weak and was found to have a fer at home of ~100 degrees.  She c/o nausea and anorexia also.

## 2015-09-24 NOTE — ED Notes (Signed)
I relay the K+ of 2.7 to [place], Therapist, sports.

## 2015-09-24 NOTE — ED Notes (Addendum)
Made MD aware of patient's potassium level

## 2015-09-24 NOTE — ED Provider Notes (Addendum)
CSN: [id number]     Arrival date & time 09/24/15  1415 History   First MD Initiated Contact with Patient 09/24/15 1454     Chief Complaint  Patient presents with  . Cancer  . Fever      HPI  Patient presents for evaluation of fever, weakness, any history of chemotherapy for adrenal cancer.  Patient started treatment 8 weeks ago on a daily oral chemotherapy. Started IV chemotherapy 2 weeks ago. Was to get treatment weekly for 2 weeks with a week of respite in between. First episode 2 weeks ago. Sunday night she developed nausea and vomiting. They delayed her chemotherapy Monday and Tuesday. She got her chemotherapy on Tuesday. She wore a Neulasta autoinjector until Thursday. For the last 2 days has had cough body aches fever nausea and weakness and presents here. Temperature 100.8 at home.  Poor appetite. Nausea. Vomiting daily. No diarrhea. Occasional cough. Was not immunized for influenza  Oncologic history:    (1) status post left breast biopsy in February of 2011 for an invasive lobular carcinoma  (2) received 6 cycles of docetaxel/ cyclophosphamide in the neoadjuvant setting,   (3) s/p left lumpectomy and axillary lymph node dissection in August of 2011 .  (4) Completed loco-regional radiation in late October of 2011  (5) on letrozole starting early November 2011, continued to October 2016, with progression  METASTATIC DISEASE: September 2016: Involving left adrenal gland, regional nodes and bones  (6) biopsy of a 7.5 cm left adrenal mass 05/30/2015 showed metastatic carcinoma, estrogen, progesterone, and HER-2 negative,  (7) capecitabine started 07/04/2015 at 1.5 g BID 7/7. Discontinued 08/29/15 with progression (8) denosumab/ Xgeva started 07/18/2015, repeated monthly (9) eribulin D1/D8 q 21 days started 09/23/14   Past Medical History  Diagnosis Date  . Hypertension   . Breast cancer ([place]) 2011    Stage 3  . Atrial fibrillation ([place])   . Hypercholesterolemia   .  Abnormal ECG   . Allergy     seasonal  . Arthritis   . GERD (gastroesophageal reflux disease)   . 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 Seizures ([place])   . Stroke ([place])     dec 15  . Thyroid disease    Past Surgical History  Procedure Laterality Date  . Cesarean section  1980  . Breast enhancement surgery  1987  . Umbilical hernia repair    . Tubal ligation    . Scar revision    . Pituitary surgery      adenoma  . Rhinoplasty  1985  . Breast lumpectomy Left   . Colonoscopy     Family History  Problem Relation Age of Onset  . Heart disease Mother   . Heart attack Father   . Heart attack Brother   . Colon cancer Neg Hx    Social History  Substance Use Topics  . Smoking status: Never Smoker   . Smokeless tobacco: Never Used  . Alcohol Use: No   OB History    No data available     Review of Systems  Constitutional: Positive for fever, chills, activity change, appetite change and fatigue. Negative for diaphoresis.  HENT: Positive for congestion, rhinorrhea and sinus pressure. Negative for mouth sores, sore throat and trouble swallowing.   Eyes: Negative for visual disturbance.  Respiratory: Positive for cough. Negative for chest tightness, shortness of breath and wheezing.   Cardiovascular: Negative for chest pain.  Gastrointestinal: Positive for nausea and  vomiting. Negative for abdominal pain, diarrhea and abdominal distention.  Endocrine: Negative for polydipsia, polyphagia and polyuria.  Genitourinary: Negative for dysuria, frequency and hematuria.  Musculoskeletal: Positive for myalgias. Negative for gait problem.  Skin: Negative for color change, pallor and rash.  Neurological: Positive for weakness. Negative for dizziness, syncope, light-headedness and headaches.  Hematological: Does not bruise/bleed easily.  Psychiatric/Behavioral: Negative for behavioral problems and confusion.      Allergies  Ciprofloxacin; Clonidine derivatives;  Phenytoin; Sulfa antibiotics; Benzonatate; and Codeine  Home Medications   Prior to Admission medications   Medication Sig Start Date End Date Taking? Authorizing Provider  acetaminophen (TYLENOL) 500 MG tablet Take 500 mg by mouth every 6 (six) hours as needed for mild pain. Reported on 09/20/2015   Yes Historical Provider, MD  ALPRAZolam Duanne Moron) 0.25 MG tablet Take 0.25 mg by mouth 2 (two) times daily as needed for anxiety. Reported on 09/20/2015 10/21/14 10/21/15 Yes Historical Provider, MD  Ascorbic Acid (VITAMIN C) 100 MG tablet Take 1,000 mg by mouth daily.    Yes Historical Provider, MD  aspirin (ASPIRIN CHILDRENS) 81 MG chewable tablet Chew 1 tablet (81 mg total) by mouth daily. 11/04/14  Yes [name], MD  atorvastatin (LIPITOR) 40 MG tablet Take 1 tablet (40 mg total) by mouth daily. 11/04/14  Yes [name], MD  hydrALAZINE (APRESOLINE) 10 MG tablet Take 10 mg by mouth 4 (four) times daily as needed. FOR SBP>200 or DBP>110 12/09/14  Yes Historical Provider, MD  hydrochlorothiazide (HYDRODIURIL) 25 MG tablet Take 1 tablet (25 mg total) by mouth daily. 06/08/15  Yes [name], MD  levothyroxine (SYNTHROID, LEVOTHROID) 100 MCG tablet Take 88 mcg by mouth daily before breakfast. 05/15/13  Yes Peter M Martinique, MD  lidocaine-prilocaine (EMLA) cream Apply to affected area once 09/09/15  Yes Laurie Panda, NP  loratadine (CLARITIN) 10 MG tablet Take 10 mg by mouth daily.   Yes Historical Provider, MD  losartan (COZAAR) 50 MG tablet Take 50 mg by mouth daily.   Yes Historical Provider, MD  metoprolol (LOPRESSOR) 50 MG tablet Take 50 mg by mouth 2 (two) times daily.   Yes Historical Provider, MD  ondansetron (ZOFRAN) 8 MG tablet Take 1 tablet (8 mg total) by mouth 2 (two) times daily. Start the day after chemo for 2 days. Then take as needed for nausea or vomiting. 09/09/15  Yes Laurie Panda, NP  ondansetron (ZOFRAN-ODT) 8 MG disintegrating tablet Take 1 tablet (8 mg total)  by mouth 2 (two) times daily as needed for nausea or vomiting. 08/29/15  Yes Laurie Panda, NP  capecitabine (XELODA) 500 MG tablet Take 3 tablets (1,500 mg total) by mouth 2 (two) times daily after a meal. Patient not taking: Reported on 09/20/2015 08/12/15   Laurie Panda, NP  famotidine (PEPCID) 20 MG tablet Take 1 tablet (20 mg total) by mouth 2 (two) times daily. Patient not taking: Reported on 09/20/2015 11/04/14   [name], MD  HYDROcodone-acetaminophen (NORCO/VICODIN) 5-325 MG tablet Take 1 tablet by mouth every 6 (six) hours as needed for moderate pain. Patient not taking: Reported on 09/09/2015 08/12/15   Laurie Panda, NP  oxyCODONE (OXY IR/ROXICODONE) 5 MG immediate release tablet Take 1 tablet (5 mg total) by mouth every 4 (four) hours as needed for severe pain. Patient not taking: Reported on 09/20/2015 09/08/15   Nicholas Lose, MD  prochlorperazine (COMPAZINE) 10 MG tablet Take 1 tablet (10 mg total) by mouth every 6 (  six) hours as needed (Nausea or vomiting). Patient not taking: Reported on 09/20/2015 09/09/15   Genelle Gather Boelter, NP   BP 136/60 mmHg  Pulse 95  Temp(Src) 100.7 F (38.2 C) (Oral)  Resp 16  SpO2 94% Physical Exam  Constitutional: She is oriented to person, place, and time. She appears well-developed and well-nourished. She appears listless.  Non-toxic appearance. She does not have a sickly appearance. She does not appear ill. No distress.  Awake alert. Appears weak. Does not appear frankly acutely ill or distressed.  HENT:  Head: Normocephalic. Macrocephalic: Mucous.  Mucus membranes dry. Conjunctiva not injected. Not pale. Anicteric.  Eyes: Conjunctivae are normal. Pupils are equal, round, and reactive to light. No scleral icterus.  Neck: Normal range of motion. Neck supple. No thyromegaly present.  Cardiovascular: Normal rate and regular rhythm.  Exam reveals no gallop and no friction rub.   No murmur heard. Pulmonary/Chest: Effort normal and  breath sounds normal. No respiratory distress. She has no wheezes. She has no rales.  Scattered basilar rhonchi. No marked signs of consolidation.  Abdominal: Soft. Bowel sounds are normal. She exhibits no distension. There is no tenderness. There is no rebound.  Musculoskeletal: Normal range of motion.  Neurological: She is oriented to person, place, and time. She appears listless.  Skin: Skin is warm and dry. No rash noted.  Psychiatric: She has a normal mood and affect. Her behavior is normal.    ED Course  Procedures (including critical care time) Labs Review Labs Reviewed  CBC WITH DIFFERENTIAL/PLATELET - Abnormal; Notable for the following:    WBC 1.2 (*)    RBC 3.11 (*)    Hemoglobin 11.1 (*)    HCT 30.7 (*)    MCH 35.7 (*)    MCHC 36.2 (*)    Neutro Abs 0.7 (*)    Lymphs Abs 0.3 (*)    All other components within normal limits  COMPREHENSIVE METABOLIC PANEL - Abnormal; Notable for the following:    Sodium 131 (*)    Potassium 2.6 (*)    Chloride 93 (*)    Glucose, Bld 252 (*)    Creatinine, Ser 1.51 (*)    Calcium 7.9 (*)    Total Protein 6.1 (*)    Albumin 3.1 (*)    AST 127 (*)    ALT 123 (*)    Alkaline Phosphatase 198 (*)    Total Bilirubin 4.2 (*)    GFR calc non Af Amer 35 (*)    GFR calc Af Amer 40 (*)    All other components within normal limits  AMMONIA - Abnormal; Notable for the following:    Ammonia 43 (*)    All other components within normal limits  I-STAT CG4 LACTIC ACID, ED - Abnormal; Notable for the following:    Lactic Acid, Venous 3.29 (*)    All other components within normal limits  URINE CULTURE  CULTURE, BLOOD (ROUTINE X 2)  CULTURE, BLOOD (ROUTINE X 2)  URINALYSIS, ROUTINE W REFLEX MICROSCOPIC (NOT AT [facility])    Imaging Review Dg Chest Port 1 View  09/24/2015  CLINICAL DATA:  Fever.  Breast carcinoma. EXAM: PORTABLE CHEST 1 VIEW COMPARISON:  Jan 16, 2011 FINDINGS: Port-A-Cath tip is in the superior vena cava. No pneumothorax. There is  ill-defined opacity in the right upper lobe, concerning for pneumonia. Lungs elsewhere appear clear. Heart is upper normal in size with pulmonary vascularity within normal limits. No apparent adenopathy. There is postoperative change on the left  with clips in left axillary region. There are breast implants bilaterally. No bone lesions. IMPRESSION: Ill-defined opacity right upper lobe, concerning for pneumonia. Lungs elsewhere appear clear. No adenopathy apparent. Electronically Signed   By: Lowella Grip III M.D.   On: 09/24/2015 15:45   I have personally reviewed and evaluated these images and lab results as part of my medical decision-making.   EKG Interpretation None      MDM   Final diagnoses:  Fever, unspecified fever cause  Other neutropenia (HCC)  Community acquired pneumonia    Eribulin, which was started just last week and which she's had 2 doses of does list elevation of ALT ASand AST as a common side effect. Friend states that her insusion on Tuesday was "just given as half a dose" 2/2 elevated LFTs.  Chest x-ray shows right upper lobe pneumonia. Patient is not hypoxemic. Not tachycardic, not hypoxemic. Initial elevated lactate. Patient given IV fluids. Recheck pending. Clinically states she feels "much better".  He has improved from 127-131 since Monday. Potassium is followed 2.6. IV supplements and then started in the emergency room. She is neutropenic with an [place] near 700. Given Maxipime, and neck, ice and. Plan will be admission.  CRITICAL CARE Performed by: Tanna Furry JOSEPH   Total critical care time: 60 minutes  Critical care time was exclusive of separately billable procedures and treating other patients.  Critical care was necessary to treat or prevent imminent or life-threatening deterioration.  Critical care was time spent personally by me on the following activities: development of treatment plan with patient and/or surrogate as well as nursing,  discussions with consultants, evaluation of patient's response to treatment, examination of patient, obtaining history from patient or surrogate, ordering and performing treatments and interventions, ordering and review of laboratory studies, ordering and review of radiographic studies, pulse oximetry and re-evaluation of patient's condition.       Tanna Furry, MD 09/24/15 [place], MD 09/24/15 1725

## 2015-09-24 NOTE — H&P (Signed)
[facility] History and Physical  Maria Pruitt [id number] DOB: August 09, 1946 DOA: 09/24/2015  Referring physician: ED physician, Dr. Jeneen Rinks  PCP: [name], PA-C   Chief Complaint: nausea and vomiting, fatigue and poor oral intake, FTT   HPI:  Patient is 70 year old female with invasive lobular breast carcinoma metastatic to left adrenal gland, regional nodes and bones, follows with Dr. Jana Hakim, patient started treatment 8 weeks prior to this admission currently on daily oral chemotherapy, started IV chemotherapy 2 weeks prior to this admission and gets these treatments every 2 weeks. She now presents to [facility] long emergency department with several days duration of progressively worsening weakness, nausea and nonbloody vomiting, poor oral intake and fatigue. Patient also reports subjective fevers and chills, cough mixed nonproductive and productive clear sputum. Patient also reports intermittent chest discomfort that occurs with coughing spells. Patient denies any specific sick contacts or exposures. She reports abdominal discomfort that occurs with vomiting episodes. Last chemotherapy on Tuesday, 4 days prior to this admission.  In emergency department, patient noted to be in mild distress due to discomfort, vital signs notable for T1 100.7, heart rate up to 110, blood work notable for WBC 1.2, hemoglobin 11, platelets 182, sodium 131, potassium 2.6, creatinine 1.51. Chest x-ray worrisome for developing pneumonia. TRH asked to admit for further evaluation and management of sepsis secondary to pneumonia with underlying febrile neutropenia.  Assessment and Plan:  Sepsis secondary to pneumonia, right upper lobe, unknown pathogen - Given immunocompromise status, placed on broad-spectrum antibiotics for now - Follow-up on blood and sputum cultures - Narrow antibiotics as clinically indicated - Follow up on lactic acid and brachial stone and levels  Febrile neutropenia -  Secondary to the above imposed on immunocompromise state from chemotherapy and metastatic cancer - Antibiotics as noted above with plans narrowing is clinically indicated  Transaminitis - No signs of liver involvement based on recent MRI of the abdomen - We'll proceed with abdominal ultrasound  Nausea and vomiting - Likely secondary to chemotherapy - Provide supportive care with IV fluids and antiemetics as needed  Pancytopenia - Likely chemotherapy-induced, follow closely  Acute kidney injury - Provide IV fluids and repeat BMP in the morning  Hypokalemia - From vomiting - Check magnesium level, supplement and repeat in the morning  Hyponatremia - Prerenal in etiology, provide IV fluids and repeat in the morning  Recurrent metastatic breast cancer - Notify primary oncologist of patient's admission  Radiological Exams on Admission: Dg Chest Port 1 View 09/24/2015  Ill-defined opacity right upper lobe, concerning for pneumonia. Lungs elsewhere appear clear. No adenopathy apparent.    Code Status: Full Family Communication: Pt at bedside Disposition Plan: Admit for further evaluation    Mart Piggs [facility] [id number]   Review of Systems:  Constitutional: Negative for diaphoresis.  HENT: Negative for hearing loss, ear pain, neck pain, tinnitus and ear discharge.   Eyes: Negative for blurred vision, double vision, photophobia, pain, discharge and redness.  Respiratory: Negative for wheezing and stridor.   Cardiovascular: Negative for chest pain, palpitations, orthopnea, claudication and leg swelling.  Gastrointestinal:  Negative for heartburn, constipation, blood in stool and melena.  Genitourinary: Negative for dysuria, urgency, frequency, hematuria and flank pain.  Musculoskeletal: Negative for myalgias, back pain, joint pain and falls.  Skin: Negative for itching and rash.  Neurological: Negative for dizziness and weakness.  Endo/Heme/Allergies: Negative for environmental  allergies and polydipsia. Does not bruise/bleed easily.  Psychiatric/Behavioral: Negative for suicidal ideas. The patient is not nervous/anxious.  Past Medical History  Diagnosis Date  . Hypertension   . Breast cancer ([place]) 2011    Stage 3  . Atrial fibrillation ([place])   . Hypercholesterolemia   . Abnormal ECG   . Allergy     seasonal  . Arthritis   . GERD (gastroesophageal reflux disease)   . 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 Seizures ([place])   . Stroke ([place])     dec 15  . Thyroid disease     Past Surgical History  Procedure Laterality Date  . Cesarean section  1980  . Breast enhancement surgery  1987  . Umbilical hernia repair    . Tubal ligation    . Scar revision    . Pituitary surgery      adenoma  . Rhinoplasty  1985  . Breast lumpectomy Left   . Colonoscopy      Social History:  reports that she has never smoked. She has never used smokeless tobacco. She reports that she does not drink alcohol or use illicit drugs.  Allergies  Allergen Reactions  . Ciprofloxacin Other (See Comments)  . Clonidine Derivatives Other (See Comments)  . Phenytoin     Other reaction(s): Other Elevated LFTs  . Sulfa Antibiotics Hives  . Benzonatate     REACTION: Swelling, tessalon perles  . Codeine     REACTION: Nausea    Family History  Problem Relation Age of Onset  . Heart disease Mother   . Heart attack Father   . Heart attack Brother   . Colon cancer Neg Hx     Prior to Admission medications   Medication Sig Start Date End Date Taking? Authorizing Provider  acetaminophen (TYLENOL) 500 MG tablet Take 500 mg by mouth every 6 (six) hours as needed for mild pain. Reported on 09/20/2015   Yes Historical Provider, MD  ALPRAZolam Duanne Moron) 0.25 MG tablet Take 0.25 mg by mouth 2 (two) times daily as needed for anxiety. Reported on 09/20/2015 10/21/14 10/21/15 Yes Historical Provider, MD  Ascorbic Acid (VITAMIN C) 100 MG tablet Take 1,000 mg by mouth  daily.    Yes Historical Provider, MD  aspirin (ASPIRIN CHILDRENS) 81 MG chewable tablet Chew 1 tablet (81 mg total) by mouth daily. 11/04/14  Yes [name], MD  atorvastatin (LIPITOR) 40 MG tablet Take 1 tablet (40 mg total) by mouth daily. 11/04/14  Yes [name], MD  hydrALAZINE (APRESOLINE) 10 MG tablet Take 10 mg by mouth 4 (four) times daily as needed. FOR SBP>200 or DBP>110 12/09/14  Yes Historical Provider, MD  hydrochlorothiazide (HYDRODIURIL) 25 MG tablet Take 1 tablet (25 mg total) by mouth daily. 06/08/15  Yes [name], MD  levothyroxine (SYNTHROID, LEVOTHROID) 100 MCG tablet Take 88 mcg by mouth daily before breakfast. 05/15/13  Yes Peter M Martinique, MD  lidocaine-prilocaine (EMLA) cream Apply to affected area once 09/09/15  Yes Laurie Panda, NP  loratadine (CLARITIN) 10 MG tablet Take 10 mg by mouth daily.   Yes Historical Provider, MD  losartan (COZAAR) 50 MG tablet Take 50 mg by mouth daily.   Yes Historical Provider, MD  metoprolol (LOPRESSOR) 50 MG tablet Take 50 mg by mouth 2 (two) times daily.   Yes Historical Provider, MD  ondansetron (ZOFRAN) 8 MG tablet Take 1 tablet (8 mg total) by mouth 2 (two) times daily. Start the day after chemo for 2 days. Then take as needed for nausea or vomiting. 09/09/15  Yes Laurie Panda, NP  ondansetron (ZOFRAN-ODT) 8 MG disintegrating tablet Take 1 tablet (8 mg total) by mouth 2 (two) times daily as needed for nausea or vomiting. 08/29/15  Yes Laurie Panda, NP  capecitabine (XELODA) 500 MG tablet Take 3 tablets (1,500 mg total) by mouth 2 (two) times daily after a meal. Patient not taking: Reported on 09/20/2015 08/12/15   Laurie Panda, NP  famotidine (PEPCID) 20 MG tablet Take 1 tablet (20 mg total) by mouth 2 (two) times daily. Patient not taking: Reported on 09/20/2015 11/04/14   [name], MD  HYDROcodone-acetaminophen (NORCO/VICODIN) 5-325 MG tablet Take 1 tablet by mouth every 6 (six) hours as needed  for moderate pain. Patient not taking: Reported on 09/09/2015 08/12/15   Laurie Panda, NP  oxyCODONE (OXY IR/ROXICODONE) 5 MG immediate release tablet Take 1 tablet (5 mg total) by mouth every 4 (four) hours as needed for severe pain. Patient not taking: Reported on 09/20/2015 09/08/15   Nicholas Lose, MD  prochlorperazine (COMPAZINE) 10 MG tablet Take 1 tablet (10 mg total) by mouth every 6 (six) hours as needed (Nausea or vomiting). Patient not taking: Reported on 09/20/2015 09/09/15   Laurie Panda, NP    Physical Exam: Filed Vitals:   09/24/15 1420 09/24/15 1510 09/24/15 1607 09/24/15 1643  BP: 101/56  128/67 136/60  Pulse: 96  91 95  Temp: 99.8 F (37.7 C) 100.7 F (38.2 C)    TempSrc: Oral Oral    Resp: 18  18 16   SpO2: 98%  98% 94%    Physical Exam  Constitutional: Appears stable, in mild distress due to discomfort HENT: Normocephalic. External right and left ear normal. Dry mucous membranes Eyes: Conjunctivae and EOM are normal. PERRLA, no scleral icterus.  Neck: Normal ROM. Neck supple. No JVD. No tracheal deviation. No thyromegaly.  CVS: RRR, S1/S2 +, no murmurs, no gallops, no carotid bruit.  Pulmonary: Effort and breath sounds normal, no stridor, rhonchi, wheezes, rales.  Abdominal: Soft. BS +,  no distension, tenderness in epigastric area, no rebound or guarding.  Musculoskeletal: Normal range of motion. No edema and no tenderness.  Lymphadenopathy: No lymphadenopathy noted, cervical, inguinal. Neuro: Alert. Normal reflexes, muscle tone coordination. No cranial nerve deficit. Skin: Skin is warm and dry. No rash noted. Not diaphoretic. No erythema. No pallor.  Psychiatric: Normal mood and affect. Behavior, judgment, thought content normal.   Labs on Admission:  Basic Metabolic Panel:  Recent Labs Lab 09/20/15 1450 09/24/15 1530  NA 127* 131*  K 3.2* 2.6*  CL  --  93*  CO2 23 25  GLUCOSE 124 252*  BUN 18.3 13  CREATININE 1.0 1.51*  CALCIUM 8.5 7.9*    Liver Function Tests:  Recent Labs Lab 09/20/15 1450 09/24/15 1530  AST 74* 127*  ALT 107* 123*  ALKPHOS 187* 198*  BILITOT 2.22* 4.2*  PROT 7.0 6.1*  ALBUMIN 3.3* 3.1*   No results for input(s): LIPASE, AMYLASE in the last 168 hours.  Recent Labs Lab 09/24/15 1530  AMMONIA 43*   CBC:  Recent Labs Lab 09/20/15 1450 09/24/15 1530  WBC 3.1* 1.2*  NEUTROABS 1.7 0.7*  HGB 13.1 11.1*  HCT 37.7 30.7*  MCV 103.6* 98.7  PLT 220 182   EKG: pending    If 7PM-7AM, please contact night-coverage [email or web address] Password [email or web address] 09/24/2015, 5:30 PM

## 2015-09-24 NOTE — ED Notes (Signed)
Pt went to restroom but missed the hat, will attempt again

## 2015-09-24 NOTE — ED Notes (Signed)
Pt cannot use restroom at this time, aware urine specimen is needed.  

## 2015-09-25 ENCOUNTER — Encounter (HOSPITAL_COMMUNITY): Payer: Self-pay | Admitting: *Deleted

## 2015-09-25 ENCOUNTER — Inpatient Hospital Stay (HOSPITAL_COMMUNITY): Payer: Medicare Other | Admitting: Anesthesiology

## 2015-09-25 DIAGNOSIS — E878 Other disorders of electrolyte and fluid balance, not elsewhere classified: Secondary | ICD-10-CM | POA: Diagnosis present

## 2015-09-25 DIAGNOSIS — D638 Anemia in other chronic diseases classified elsewhere: Secondary | ICD-10-CM | POA: Diagnosis present

## 2015-09-25 DIAGNOSIS — D696 Thrombocytopenia, unspecified: Secondary | ICD-10-CM | POA: Diagnosis present

## 2015-09-25 DIAGNOSIS — J189 Pneumonia, unspecified organism: Secondary | ICD-10-CM

## 2015-09-25 DIAGNOSIS — N179 Acute kidney failure, unspecified: Secondary | ICD-10-CM | POA: Diagnosis present

## 2015-09-25 DIAGNOSIS — J181 Lobar pneumonia, unspecified organism: Secondary | ICD-10-CM

## 2015-09-25 LAB — BASIC METABOLIC PANEL
Anion gap: 12 (ref 5–15)
BUN: 11 mg/dL (ref 6–20)
CHLORIDE: 100 mmol/L — AB (ref 101–111)
CO2: 23 mmol/L (ref 22–32)
Calcium: 7.4 mg/dL — ABNORMAL LOW (ref 8.9–10.3)
Creatinine, Ser: 1.35 mg/dL — ABNORMAL HIGH (ref 0.44–1.00)
GFR calc non Af Amer: 40 mL/min — ABNORMAL LOW (ref >60)
GFR, EST AFRICAN AMERICAN: 46 mL/min — AB (ref >60)
Glucose, Bld: 205 mg/dL — ABNORMAL HIGH (ref 65–99)
POTASSIUM: 2.7 mmol/L — AB (ref 3.5–5.1)
SODIUM: 135 mmol/L (ref 135–145)

## 2015-09-25 LAB — HEPATIC FUNCTION PANEL
ALBUMIN: 2.5 g/dL — AB (ref 3.5–5.0)
ALT: 94 U/L — ABNORMAL HIGH (ref 14–54)
AST: 98 U/L — ABNORMAL HIGH (ref 15–41)
Alkaline Phosphatase: 152 U/L — ABNORMAL HIGH (ref 38–126)
BILIRUBIN INDIRECT: 1.6 mg/dL — AB (ref 0.3–0.9)
Bilirubin, Direct: 2.6 mg/dL — ABNORMAL HIGH (ref 0.1–0.5)
TOTAL PROTEIN: 5.2 g/dL — AB (ref 6.5–8.1)
Total Bilirubin: 4.2 mg/dL — ABNORMAL HIGH (ref 0.3–1.2)

## 2015-09-25 LAB — MAGNESIUM: Magnesium: 1.1 mg/dL — ABNORMAL LOW (ref 1.7–2.4)

## 2015-09-25 LAB — CBC
HEMATOCRIT: 27.1 % — AB (ref 36.0–46.0)
Hemoglobin: 9.8 g/dL — ABNORMAL LOW (ref 12.0–15.0)
MCH: 36.3 pg — ABNORMAL HIGH (ref 26.0–34.0)
MCHC: 36.2 g/dL — ABNORMAL HIGH (ref 30.0–36.0)
MCV: 100.4 fL — AB (ref 78.0–100.0)
Platelets: 156 10*3/uL (ref 150–400)
RBC: 2.7 MIL/uL — AB (ref 3.87–5.11)
RDW: 13.8 % (ref 11.5–15.5)
WBC: 1 10*3/uL — AB (ref 4.0–10.5)

## 2015-09-25 LAB — URINALYSIS, ROUTINE W REFLEX MICROSCOPIC
Bilirubin Urine: NEGATIVE
Glucose, UA: NEGATIVE mg/dL
HGB URINE DIPSTICK: NEGATIVE
Ketones, ur: NEGATIVE mg/dL
Nitrite: NEGATIVE
Protein, ur: NEGATIVE mg/dL
SPECIFIC GRAVITY, URINE: 1.009 (ref 1.005–1.030)
pH: 6 (ref 5.0–8.0)

## 2015-09-25 LAB — URINE MICROSCOPIC-ADD ON
RBC / HPF: NONE SEEN RBC/hpf (ref 0–5)
SQUAMOUS EPITHELIAL / LPF: NONE SEEN

## 2015-09-25 LAB — PHOSPHORUS: Phosphorus: 1.4 mg/dL — ABNORMAL LOW (ref 2.5–4.6)

## 2015-09-25 LAB — LACTIC ACID, PLASMA: LACTIC ACID, VENOUS: 2.4 mmol/L — AB (ref 0.5–2.0)

## 2015-09-25 LAB — TROPONIN I: TROPONIN I: 0.08 ng/mL — AB (ref <0.031)

## 2015-09-25 MED ORDER — METOPROLOL TARTRATE 1 MG/ML IV SOLN
5.0000 mg | Freq: Four times a day (QID) | INTRAVENOUS | Status: DC | PRN
  Administered 2015-09-25 – 2015-10-03 (×3): 5 mg via INTRAVENOUS
  Filled 2015-09-25 (×4): qty 5

## 2015-09-25 MED ORDER — DILTIAZEM HCL 100 MG IV SOLR
5.0000 mg/h | INTRAVENOUS | Status: DC
  Administered 2015-09-25: 5 mg/h via INTRAVENOUS
  Administered 2015-09-25: 10 mg/h via INTRAVENOUS
  Filled 2015-09-25 (×2): qty 100

## 2015-09-25 MED ORDER — SODIUM CHLORIDE 0.9 % IV SOLN
INTRAVENOUS | Status: DC
Start: 2015-09-25 — End: 2015-09-28
  Administered 2015-09-27: 75 mL via INTRAVENOUS
  Administered 2015-09-28: 08:00:00 via INTRAVENOUS

## 2015-09-25 MED ORDER — SODIUM CHLORIDE 0.9 % IV SOLN
1.0000 g | Freq: Once | INTRAVENOUS | Status: AC
  Administered 2015-09-25: 1 g via INTRAVENOUS
  Filled 2015-09-25: qty 10

## 2015-09-25 MED ORDER — MORPHINE SULFATE (PF) 2 MG/ML IV SOLN
1.0000 mg | INTRAVENOUS | Status: DC | PRN
  Filled 2015-09-25: qty 1

## 2015-09-25 MED ORDER — POTASSIUM CHLORIDE 10 MEQ/100ML IV SOLN
10.0000 meq | INTRAVENOUS | Status: AC
  Administered 2015-09-25 (×4): 10 meq via INTRAVENOUS
  Filled 2015-09-25 (×4): qty 100

## 2015-09-25 MED ORDER — ACETAMINOPHEN 325 MG PO TABS
325.0000 mg | ORAL_TABLET | Freq: Four times a day (QID) | ORAL | Status: DC | PRN
  Administered 2015-09-25 – 2015-09-26 (×3): 325 mg via ORAL
  Filled 2015-09-25 (×4): qty 1

## 2015-09-25 MED ORDER — DILTIAZEM LOAD VIA INFUSION
10.0000 mg | Freq: Once | INTRAVENOUS | Status: AC
  Administered 2015-09-25: 10 mg via INTRAVENOUS
  Filled 2015-09-25: qty 10

## 2015-09-25 MED ORDER — ACETAMINOPHEN 325 MG PO TABS
325.0000 mg | ORAL_TABLET | Freq: Once | ORAL | Status: AC
  Administered 2015-09-25: 325 mg via ORAL

## 2015-09-25 MED ORDER — POTASSIUM CHLORIDE CRYS ER 20 MEQ PO TBCR: 40.0000 meq | EXTENDED_RELEASE_TABLET | Freq: Two times a day (BID) | ORAL | Status: DC

## 2015-09-25 MED ORDER — POTASSIUM CHLORIDE 10 MEQ/100ML IV SOLN
10.0000 meq | INTRAVENOUS | Status: AC
  Administered 2015-09-25 (×6): 10 meq via INTRAVENOUS
  Filled 2015-09-25 (×2): qty 100

## 2015-09-25 MED ORDER — POTASSIUM CHLORIDE CRYS ER 20 MEQ PO TBCR
40.0000 meq | EXTENDED_RELEASE_TABLET | Freq: Once | ORAL | Status: AC
  Administered 2015-09-25: 40 meq via ORAL
  Filled 2015-09-25: qty 2

## 2015-09-25 MED ORDER — TRAMADOL HCL 50 MG PO TABS: 50.0000 mg | ORAL_TABLET | Freq: Four times a day (QID) | ORAL | Status: DC | PRN

## 2015-09-25 MED ORDER — OXYCODONE HCL 5 MG PO TABS
5.0000 mg | ORAL_TABLET | ORAL | Status: DC | PRN
  Administered 2015-09-25 – 2015-09-30 (×5): 5 mg via ORAL
  Filled 2015-09-25 (×6): qty 1

## 2015-09-25 MED ORDER — DEXTROSE 5 % IV SOLN: 5.0000 mg/h | INTRAVENOUS | Status: DC

## 2015-09-25 MED ORDER — POTASSIUM PHOSPHATES 15 MMOLE/5ML IV SOLN
20.0000 meq | Freq: Once | INTRAVENOUS | Status: AC
  Administered 2015-09-25: 20 meq via INTRAVENOUS
  Filled 2015-09-25: qty 4.55

## 2015-09-25 MED ORDER — SODIUM CHLORIDE 0.9 % IV BOLUS (SEPSIS)
250.0000 mL | Freq: Once | INTRAVENOUS | Status: AC
  Administered 2015-09-25: 250 mL via INTRAVENOUS

## 2015-09-25 NOTE — Progress Notes (Signed)
Sister and pt report that pt no longer takes schedulaed Hydralazine at home.  It is prn only.  Did not give hs dose.  bp is stable 118/57

## 2015-09-25 NOTE — Progress Notes (Signed)
Lab reports K+ is now 2.7 after 3 runs.  Lactic acid is now 2.4.  Tom callahan is on call and notified

## 2015-09-25 NOTE — Progress Notes (Signed)
Utilization review completed.  

## 2015-09-25 NOTE — Progress Notes (Addendum)
Patient ID: Maria Pruitt, female   DOB: 1946/01/28, 70 y.o.   MRN: [id number]  [facility] PROGRESS NOTE  Maria Pruitt [id number] DOB: Feb 02, 1946 DOA: 09/24/2015 PCP: Maria Nova, PA-C   Brief narrative:    Patient is 70 year old female with invasive lobular breast carcinoma metastatic to left adrenal gland, regional nodes and bones, follows with Dr. Jana Pruitt, patient started treatment 8 weeks prior to this admission currently on daily oral chemotherapy, started IV chemotherapy 2 weeks prior to this admission and gets these treatments every 2 weeks. She now presents to [facility] long emergency department with several days duration of progressively worsening weakness, nausea and nonbloody vomiting, poor oral intake and fatigue. Patient also reports subjective fevers and chills, cough mixed nonproductive and productive clear sputum. Patient also reports intermittent chest discomfort that occurs with coughing spells. Patient denies any specific sick contacts or exposures. She reports abdominal discomfort that occurs with vomiting episodes. Last chemotherapy on Tuesday, 4 days prior to this admission.  In emergency department, patient noted to be in mild distress due to discomfort, vital signs notable for T1 100.7, heart rate up to 110, blood work notable for WBC 1.2, hemoglobin 11, platelets 182, sodium 131, potassium 2.6, creatinine 1.51. Chest x-ray worrisome for developing pneumonia. TRH asked to admit for further evaluation and management of sepsis secondary to pneumonia with underlying febrile neutropenia.  Assessment/Plan:    Sepsis secondary to pneumonia, right upper lobe, unknown pathogen - Given immunocompromise status, placed on broad-spectrum antibiotics vanc and zosyn, continue day #2 - Tmax 101.1 7 F since admission  - Follow-up on blood and sputum cultures - Narrow antibiotics as clinically indicated - lactic acid still elevated, continue IVF and repeat lactic  acid to ensure resolution   Febrile neutropenia - Secondary to the above imposed on immunocompromise state from chemotherapy and metastatic cancer - Antibiotics as noted above with plans narrowing is clinically indicated - monitor fever curve and WBC   A-fib with RVR, CHADS 2 score 2-3 - with HR in 140's, no response to one dose of Metoprolol - suspect this is related to principal problem - will place on Cardizem drip and see if this will help - requested V/Q scan to rule out PE - spoke with cardiology team, no need for troponins check as pt with multiple other issues that could cause rise in trop's - hold off on [facility], given high risk bleed, Hg drop from 13 --> 11 --> 9.8 - if no response to cardizem, will have official cardio consult placed  - supplement K and Ca and keep on telemetry monitoring  - ECHO requested   Transaminitis - No signs of liver involvement based on recent MRI of the abdomen - abd [place] with liver cirrhosis, pending LFT's this AM - may need GI consult if LFT's worse - I see that oncologist Dr. Jana Pruitt ordered MRI abd to be done next week, will see if we can do it here - since it will require contrast, will hold off for now and if Cr improved maybe we can do it in next few days   Nausea and vomiting - Likely secondary to chemotherapy and acute illness  - Provide supportive care with IV fluids and antiemetics as needed  Thrombocytopenia - Likely chemotherapy-induced, follow closely  Acute kidney injury, pre renal in etiology  - responding to IVF, Cr trending down - BMP in AM  Anemia of chronic disease, Malignancy - Hg drop from 1/10 from 13 --> 11 --. 9.8  this AM - monitor   Hypokalemia, Hypomagnesemia, hypophosphatemia  - severe electrolytes abnormalities, from vomiting, dehydrations, chemo  - continue to supplement and repeat in the morning  Hypocalcemia - supplement via IV route - BMP in AM  Hyponatremia - Prerenal in etiology, provide IV fluids and  repeat in the morning  Recurrent metastatic breast cancer - Notified primary oncologist of patient's admission  DVT prophylaxis - SCD's  Code Status: Full.  Family Communication:  plan of care discussed with the patient and family at bedside  Disposition Plan: Home acute illness treated, still rather sick and unable ot be discharged   IV access:  Peripheral IV  Procedures and diagnostic studies:    US Abdomen Complete 09/24/2015  Mildly nodular contour of the liver raises concern for some degree of hepatic cirrhosis, which may explain the patient's transaminitis. 2. No focal hepatic lesions seen on ultrasound, though if the patient's transaminitis significantly worsens, dynamic liver protocol MRI or CT could be considered for further evaluation. 3. Mild internal echoes within the gallbladder may reflect mild sludge. Gallbladder otherwise unremarkable.   Ir US Guide Vasc Access Right 09/08/2015  Status post placement of right IJ port.  Catheter ready for use.   Dg Chest Port 1 View 09/24/2015  Ill-defined opacity right upper lobe, concerning for pneumonia. Lungs elsewhere appear clear. No adenopathy apparent.  Medical Consultants:  Cardiology over the phone   Other Consultants:  None  IAnti-Infectives:   Vancomycin 1/14 --> Zosyn 1/14 -->  Maria Ramsay, MD  [facility] Pager [phone]  If 7PM-7AM, please contact night-coverage [email or web address] Password [email or web address] 09/25/2015, 8:12 AM   LOS: 1 day   HPI/Subjective: No events overnight. Pt reports feeling tired, warm, weak.   Objective: Filed Vitals:   09/24/15 2000 09/24/15 2034 09/25/15 0001 09/25/15 0514  BP:  127/67 118/57 105/53  Pulse:  96 94 98  Temp:  99.4 F (37.4 C) 99.1 F (37.3 C) 99.3 F (37.4 C)  TempSrc:  Oral Oral Oral  Resp:  20 18 24   Height: 5\' 4"  (1.626 m)     Weight: 77.1 kg (169 lb 15.6 oz)     SpO2:  100% 98% 98%    Intake/Output Summary (Last 24 hours) at 09/25/15 [id number] Last data filed at 09/25/15  0645  Gross per 24 hour  Intake 2163.1 ml  Output   2450 ml  Net -286.9 ml    Exam:   General:  Pt is alert, follows commands appropriately, not in acute distress  Cardiovascular: Irregular rhythm, no rubs, no gallops  Respiratory: Rhonchi at bases   Abdomen: Soft, mildly tender in epigastric area, bowel sounds present, no guarding  Extremities: pulses DP and PT palpable bilaterally  Neuro: Grossly nonfocal  Data Reviewed: Basic Metabolic Panel:  Recent Labs Lab 09/20/15 1450 09/24/15 1530 09/24/15 2125 09/25/15 0245  NA 127* 131*  --  135  K 3.2* 2.6*  --  2.7*  CL  --  93*  --  100*  CO2 23 25  --  23  GLUCOSE 124 252*  --  205*  BUN 18.3 13  --  11  CREATININE 1.0 1.51*  --  1.35*  CALCIUM 8.5 7.9*  --  7.4*  MG  --   --  0.9*  --   PHOS  --   --  1.1*  --    Liver Function Tests:  Recent Labs Lab 09/20/15 1450 09/24/15 1530  AST 74* 127*  ALT 107* 123*  ALKPHOS 187* 198*  BILITOT 2.22* 4.2*  PROT 7.0 6.1*  ALBUMIN 3.3* 3.1*    Recent Labs Lab 09/24/15 1530  AMMONIA 43*   CBC:  Recent Labs Lab 09/20/15 1450 09/24/15 1530 09/25/15 0245  WBC 3.1* 1.2* 1.0*  NEUTROABS 1.7 0.7*  --   HGB 13.1 11.1* 9.8*  HCT 37.7 30.7* 27.1*  MCV 103.6* 98.7 100.4*  PLT 220 182 156   Recent Results (from the past 240 hour(s))  Culture, blood (Routine X 2) w Reflex to ID Panel     Status: None (Preliminary result)   Collection Time: 09/24/15  3:25 PM  Result Value Ref Range Status   Specimen Description   Final    BLOOD PORTA CATH BOTTLES DRAWN AEROBIC AND ANAEROBIC   Special Requests 10CC Performed at [facility]   Final   Culture PENDING  Incomplete   Report Status PENDING  Incomplete     Scheduled Meds: . hydrALAZINE  10 mg Oral 3 times per day  . levothyroxine  88 mcg Oral QAC breakfast  . metoprolol  50 mg Oral BID  . piperacillin-tazobactam (ZOSYN)  IV  3.375 g Intravenous Q8H  . sodium chloride  1,500 mL Intravenous Once  .  sodium chloride  10-40 mL Intracatheter Q12H  . sodium chloride  3 mL Intravenous Q12H  . vancomycin  1,000 mg Intravenous Q24H   Continuous Infusions: . sodium chloride 75 mL/hr (09/24/15 2029)

## 2015-09-25 NOTE — Progress Notes (Signed)
Lab reports Mag is 0.9 and phos is 1.1.  Lactic acid is coming down and is currently 2.1.  Maria Pruitt on call and notified.  New orders received

## 2015-09-26 ENCOUNTER — Other Ambulatory Visit: Payer: Medicare Other

## 2015-09-26 ENCOUNTER — Inpatient Hospital Stay (HOSPITAL_COMMUNITY): Payer: Medicare Other

## 2015-09-26 ENCOUNTER — Other Ambulatory Visit (HOSPITAL_COMMUNITY): Payer: Medicare Other

## 2015-09-26 ENCOUNTER — Ambulatory Visit: Payer: Medicare Other | Admitting: Oncology

## 2015-09-26 ENCOUNTER — Encounter (HOSPITAL_COMMUNITY): Payer: Self-pay | Admitting: Radiology

## 2015-09-26 DIAGNOSIS — R0600 Dyspnea, unspecified: Secondary | ICD-10-CM

## 2015-09-26 DIAGNOSIS — E878 Other disorders of electrolyte and fluid balance, not elsewhere classified: Secondary | ICD-10-CM

## 2015-09-26 DIAGNOSIS — C50919 Malignant neoplasm of unspecified site of unspecified female breast: Secondary | ICD-10-CM

## 2015-09-26 DIAGNOSIS — D61810 Antineoplastic chemotherapy induced pancytopenia: Secondary | ICD-10-CM

## 2015-09-26 DIAGNOSIS — I481 Persistent atrial fibrillation: Secondary | ICD-10-CM

## 2015-09-26 DIAGNOSIS — I2699 Other pulmonary embolism without acute cor pulmonale: Secondary | ICD-10-CM

## 2015-09-26 LAB — COMPREHENSIVE METABOLIC PANEL
ALBUMIN: 2.6 g/dL — AB (ref 3.5–5.0)
ALT: 88 U/L — ABNORMAL HIGH (ref 14–54)
AST: 103 U/L — AB (ref 15–41)
Alkaline Phosphatase: 160 U/L — ABNORMAL HIGH (ref 38–126)
Anion gap: 12 (ref 5–15)
BUN: 7 mg/dL (ref 6–20)
CHLORIDE: 95 mmol/L — AB (ref 101–111)
CO2: 24 mmol/L (ref 22–32)
Calcium: 7.2 mg/dL — ABNORMAL LOW (ref 8.9–10.3)
Creatinine, Ser: 1.06 mg/dL — ABNORMAL HIGH (ref 0.44–1.00)
GFR calc Af Amer: >60 mL/min (ref >60)
GFR calc non Af Amer: 53 mL/min — ABNORMAL LOW (ref >60)
GLUCOSE: 124 mg/dL — AB (ref 65–99)
POTASSIUM: 3.4 mmol/L — AB (ref 3.5–5.1)
SODIUM: 131 mmol/L — AB (ref 135–145)
Total Bilirubin: 4.8 mg/dL — ABNORMAL HIGH (ref 0.3–1.2)
Total Protein: 5.5 g/dL — ABNORMAL LOW (ref 6.5–8.1)

## 2015-09-26 LAB — BASIC METABOLIC PANEL
ANION GAP: 11 (ref 5–15)
BUN: 7 mg/dL (ref 6–20)
CO2: 23 mmol/L (ref 22–32)
Calcium: 6.9 mg/dL — ABNORMAL LOW (ref 8.9–10.3)
Chloride: 93 mmol/L — ABNORMAL LOW (ref 101–111)
Creatinine, Ser: 1.06 mg/dL — ABNORMAL HIGH (ref 0.44–1.00)
GFR, EST NON AFRICAN AMERICAN: 53 mL/min — AB (ref >60)
GLUCOSE: 119 mg/dL — AB (ref 65–99)
POTASSIUM: 3.2 mmol/L — AB (ref 3.5–5.1)
Sodium: 127 mmol/L — ABNORMAL LOW (ref 135–145)

## 2015-09-26 LAB — INFLUENZA PANEL BY PCR (TYPE A & B)
H1N1 flu by pcr: NOT DETECTED
INFLAPCR: POSITIVE — AB
Influenza B By PCR: NEGATIVE

## 2015-09-26 LAB — CBC
HEMATOCRIT: 28.9 % — AB (ref 36.0–46.0)
Hemoglobin: 10.5 g/dL — ABNORMAL LOW (ref 12.0–15.0)
MCH: 36.7 pg — ABNORMAL HIGH (ref 26.0–34.0)
MCHC: 36.3 g/dL — AB (ref 30.0–36.0)
MCV: 101 fL — AB (ref 78.0–100.0)
Platelets: 148 10*3/uL — ABNORMAL LOW (ref 150–400)
RBC: 2.86 MIL/uL — ABNORMAL LOW (ref 3.87–5.11)
RDW: 14 % (ref 11.5–15.5)
WBC: 2.1 10*3/uL — ABNORMAL LOW (ref 4.0–10.5)

## 2015-09-26 LAB — PATHOLOGIST SMEAR REVIEW

## 2015-09-26 LAB — URINE CULTURE: CULTURE: NO GROWTH

## 2015-09-26 LAB — LACTIC ACID, PLASMA
LACTIC ACID, VENOUS: 2.2 mmol/L — AB (ref 0.5–2.0)
LACTIC ACID, VENOUS: 2.1 mmol/L — AB (ref 0.5–2.0)
Lactic Acid, Venous: 1.6 mmol/L (ref 0.5–2.0)

## 2015-09-26 LAB — PHOSPHORUS: Phosphorus: 1.8 mg/dL — ABNORMAL LOW (ref 2.5–4.6)

## 2015-09-26 LAB — T4, FREE: Free T4: 1.55 ng/dL — ABNORMAL HIGH (ref 0.61–1.12)

## 2015-09-26 LAB — EXPECTORATED SPUTUM ASSESSMENT W GRAM STAIN, RFLX TO RESP C

## 2015-09-26 LAB — MAGNESIUM
Magnesium: 0.9 mg/dL — CL (ref 1.7–2.4)
Magnesium: 1.3 mg/dL — ABNORMAL LOW (ref 1.7–2.4)

## 2015-09-26 LAB — STREP PNEUMONIAE URINARY ANTIGEN: STREP PNEUMO URINARY ANTIGEN: NEGATIVE

## 2015-09-26 MED ORDER — ENOXAPARIN SODIUM 40 MG/0.4ML ~~LOC~~ SOLN
40.0000 mg | SUBCUTANEOUS | Status: DC
  Administered 2015-09-26 – 2015-10-04 (×8): 40 mg via SUBCUTANEOUS
  Filled 2015-09-26 (×9): qty 0.4

## 2015-09-26 MED ORDER — LEVALBUTEROL HCL 1.25 MG/0.5ML IN NEBU
1.2500 mg | INHALATION_SOLUTION | Freq: Three times a day (TID) | RESPIRATORY_TRACT | Status: DC
  Administered 2015-09-26 (×2): 1.25 mg via RESPIRATORY_TRACT
  Filled 2015-09-26 (×4): qty 0.5

## 2015-09-26 MED ORDER — SODIUM CHLORIDE 0.9 % IV BOLUS (SEPSIS)
1000.0000 mL | Freq: Once | INTRAVENOUS | Status: AC
  Administered 2015-09-26: 1000 mL via INTRAVENOUS

## 2015-09-26 MED ORDER — POTASSIUM & SODIUM PHOSPHATES 280-160-250 MG PO PACK
1.0000 | PACK | Freq: Three times a day (TID) | ORAL | Status: AC
  Administered 2015-09-26 – 2015-09-29 (×11): 1 via ORAL
  Filled 2015-09-26 (×15): qty 1

## 2015-09-26 MED ORDER — GUAIFENESIN ER 600 MG PO TB12
600.0000 mg | ORAL_TABLET | Freq: Two times a day (BID) | ORAL | Status: DC
  Administered 2015-09-26 – 2015-10-01 (×10): 600 mg via ORAL
  Filled 2015-09-26 (×14): qty 1

## 2015-09-26 MED ORDER — OSELTAMIVIR PHOSPHATE 30 MG PO CAPS
30.0000 mg | ORAL_CAPSULE | Freq: Two times a day (BID) | ORAL | Status: DC
  Administered 2015-09-26 – 2015-09-28 (×5): 30 mg via ORAL
  Filled 2015-09-26 (×6): qty 1

## 2015-09-26 MED ORDER — IOHEXOL 350 MG/ML SOLN
100.0000 mL | Freq: Once | INTRAVENOUS | Status: AC | PRN
  Administered 2015-09-26: 100 mL via INTRAVENOUS

## 2015-09-26 MED ORDER — IPRATROPIUM BROMIDE 0.02 % IN SOLN
0.5000 mg | Freq: Three times a day (TID) | RESPIRATORY_TRACT | Status: DC
  Administered 2015-09-27 – 2015-10-04 (×20): 0.5 mg via RESPIRATORY_TRACT
  Filled 2015-09-26 (×21): qty 2.5

## 2015-09-26 MED ORDER — POTASSIUM CHLORIDE 10 MEQ/100ML IV SOLN
10.0000 meq | Freq: Once | INTRAVENOUS | Status: AC
  Administered 2015-09-26: 10 meq via INTRAVENOUS
  Filled 2015-09-26: qty 100

## 2015-09-26 MED ORDER — VANCOMYCIN HCL IN DEXTROSE 750-5 MG/150ML-% IV SOLN
750.0000 mg | Freq: Two times a day (BID) | INTRAVENOUS | Status: DC
  Administered 2015-09-26 – 2015-09-28 (×5): 750 mg via INTRAVENOUS
  Filled 2015-09-26 (×5): qty 150

## 2015-09-26 MED ORDER — LEVALBUTEROL HCL 1.25 MG/0.5ML IN NEBU
1.2500 mg | INHALATION_SOLUTION | Freq: Three times a day (TID) | RESPIRATORY_TRACT | Status: DC
  Administered 2015-09-27 – 2015-10-04 (×20): 1.25 mg via RESPIRATORY_TRACT
  Filled 2015-09-26 (×25): qty 0.5

## 2015-09-26 MED ORDER — IBUPROFEN 400 MG PO TABS
400.0000 mg | ORAL_TABLET | Freq: Once | ORAL | Status: AC
  Administered 2015-09-26: 400 mg via ORAL
  Filled 2015-09-26: qty 2
  Filled 2015-09-26: qty 1

## 2015-09-26 MED ORDER — OSELTAMIVIR PHOSPHATE 75 MG PO CAPS: 75.0000 mg | ORAL_CAPSULE | Freq: Two times a day (BID) | ORAL | Status: DC

## 2015-09-26 MED ORDER — SODIUM CHLORIDE 0.9 % IV BOLUS (SEPSIS)
500.0000 mL | Freq: Once | INTRAVENOUS | Status: AC
  Administered 2015-09-26: 500 mL via INTRAVENOUS

## 2015-09-26 MED ORDER — BOOST / RESOURCE BREEZE PO LIQD
1.0000 | Freq: Three times a day (TID) | ORAL | Status: DC
  Administered 2015-09-26 – 2015-10-04 (×11): 1 via ORAL

## 2015-09-26 MED ORDER — POTASSIUM CHLORIDE CRYS ER 20 MEQ PO TBCR
40.0000 meq | EXTENDED_RELEASE_TABLET | Freq: Once | ORAL | Status: AC
  Administered 2015-09-26: 40 meq via ORAL
  Filled 2015-09-26: qty 2

## 2015-09-26 MED ORDER — MAGNESIUM SULFATE 2 GM/50ML IV SOLN
2.0000 g | Freq: Once | INTRAVENOUS | Status: AC
  Administered 2015-09-26: 2 g via INTRAVENOUS
  Filled 2015-09-26: qty 50

## 2015-09-26 MED ORDER — IPRATROPIUM BROMIDE 0.02 % IN SOLN
0.5000 mg | Freq: Three times a day (TID) | RESPIRATORY_TRACT | Status: DC
  Administered 2015-09-26 (×2): 0.5 mg via RESPIRATORY_TRACT
  Filled 2015-09-26 (×2): qty 2.5

## 2015-09-26 NOTE — Progress Notes (Addendum)
Paged attending MD to advise of Radiology request to change pt's test to CT angiogram for best results   MD returned call at [phone] and is aware, stated she will look at pt's chart

## 2015-09-26 NOTE — Progress Notes (Signed)
Pharmacy Antibiotic Follow-up Note  Maria Pruitt is a 70 y.o. year-old female admitted on 09/24/2015.  The patient is currently on day 3 of Zosyn 3.375 grams IV q8h (extended-infusion) + vancomycin 1 gram IV q24h for sepsis secondary to PNA in setting of neutropenic febrile.  Assessment: Remains febrile Renal function improved Leukopenia improving (ANC has not been repeated since 1/14) No useful culture data yet.  Plan: 1. Increase empiric vancomycin to 750 mg IV q12h per nomogram based on improved renal function. 2. Continue empiric Zosyn 3.375 grams IV q8h (extended-infusion). 3. Follow culture results for opportunity to de-escalate antibiotic therapy.   Temp (24hrs), Avg:100.6 F (38.1 C), Min:99.2 F (37.3 C), Max:102.9 F (39.4 C)   Recent Labs Lab 09/20/15 1450 09/24/15 1530 09/25/15 0245 09/26/15 0320  WBC 3.1* 1.2* 1.0* 2.1*    Recent Labs Lab 09/20/15 1450 09/24/15 1530 09/25/15 0245 09/26/15 0320  CREATININE 1.0 1.51* 1.35* 1.06*   Estimated Creatinine Clearance: 52.6 mL/min (by C-G formula based on Cr of 1.06).    Allergies  Allergen Reactions  . Ciprofloxacin Other (See Comments)  . Clonidine Derivatives Other (See Comments)  . Phenytoin     Other reaction(s): Other Elevated LFTs  . Sulfa Antibiotics Hives  . Benzonatate     REACTION: Swelling, tessalon perles  . Codeine     REACTION: Nausea    Antimicrobials this admission: 1/14 >> cefepime x1 1/14 >> vancomycin>> 1/14 >> Zosyn >>   Levels/dose changes this admission: 1/16: Vancomycin increased from 1g q24h to 750 mg q12h due to improved renal function and continued fevers.   Microbiology results: 1/14 blood x 2 , incl PAC: ngtd 1/15 urine:collected 1/15 sputum: rejected, need to recollect  Thank you for allowing pharmacy to be a part of this patient's care.  Joycelyn Rua PharmD, BCPS 09/26/2015 7:57 AM

## 2015-09-26 NOTE — Progress Notes (Signed)
Temp this AM is 102.9, 325mg  tylenol given as per order (not 650), mag is 0.9, LA is 2.2.  Urine culture and Ua gotten yesterday but no BLood cultures, Kathline Magic on call and notified of all of this.  Awaiting reply.

## 2015-09-26 NOTE — Progress Notes (Signed)
COURTESY NOTE: Maria Pruitt is currently day 13 cycle 1 of eribulin; her counts are recovering; We will have to reduce dose in future cycles because she received neulat on day 2 and still was neutropenic.  Given Maria Pruitt's history of a prior hemorrhagic CVA, I would be uncomfortable with full anticoagulation. I would recommend low dose (as for prophylaxis) lovenox.   Full consult to follow.   SUMMARY OF PAST TREATMENTS:  70 y.o. [place], [place] woman  (1) status post left breast biopsy in February of 2011 for an invasive lobular carcinoma measuring 2.9 cm by MRI, with a positive prechemotherapy axillary lymph node biopsy, strongly estrogen and progesterone receptor positive with no evidence of HER-2/neu amplification and an MIB-1 of 14%.   (2) received 6 cycles of docetaxel/ cyclophosphamide in the neoadjuvant setting, completed in mid July of 2011   (3) s/p left lumpectomy and axillary lymph node dissection in August of 2011 for a ypT1b yTN3 (14 positive lymph nodes), Stage IIIC, grade 1 residual tumor. HER-2/neu was repeated, again not amplified.   (4) Completed loco-regional radiation in late October of 2011   (5) on letrozole starting early November 2011, continued to October 2016, with progression  METASTATIC DISEASE: September 2016: Involving left adrenal gland, regional nodes and bones  (6) biopsy of a 7.5 cm left adrenal mass 05/30/2015 showed metastatic carcinoma, estrogen, progesterone, and HER-2 negative, with an MIB-1 of 90-100%; the tumor cells were cytokeratin 7 positive, cytokeratin 20 focally positive, gross cystic disease fluid protein negative (a) the CA-27-29 is informative (b) PET scan 06/17/2015 shows, in addition to the left adrenal mass, some periaortic adenopathy and multiple hypermetabolic bone metastases (L2, T9, T2 and others) (c) EGD on 06/16/2015 to evaluate suspicious cardiac wall thickening showed no evidence of  malignancy  (7) capecitabine started 07/04/2015 at 1.5 g BID 7/7. Discontinued 08/29/15 with progression  (8) denosumab/ Xgeva started 07/18/2015, repeated monthly  (9) eribulin D1/D8 q 21 days started 09/14/15   OTHER PROBLEMS: (a) the patient is status post hemorrhagic CVA January 2016  (b) congenital absence of left kidney

## 2015-09-26 NOTE — Progress Notes (Signed)
Nutrition Follow-up  DOCUMENTATION CODES:   Not applicable  INTERVENTION:  - Will order Boost Breeze po TID, each supplement provides 250 kcal and 9 grams of protein - Encourage intake of supplement and of meals as pt tolerates - RD will continue to monitor for needs  NUTRITION DIAGNOSIS:   Increased nutrient needs related to catabolic illness, cancer and cancer related treatments as evidenced by estimated needs.  GOAL:   Patient will meet greater than or equal to 90% of their needs  MONITOR:   PO intake, Supplement acceptance, Weight trends, Labs, I & O's  REASON FOR ASSESSMENT:   Malnutrition Screening Tool  ASSESSMENT:   70 year old female with invasive lobular breast carcinoma metastatic to left adrenal gland, regional nodes and bones, follows with Dr. Jana Hakim, patient started treatment 8 weeks prior to this admission currently on daily oral chemotherapy, started IV chemotherapy 2 weeks prior to this admission and gets these treatments every 2 weeks. She now presents to [facility] long emergency department with several days duration of progressively worsening weakness, nausea and nonbloody vomiting, poor oral intake and fatigue. Patient also reports subjective fevers and chills, cough mixed nonproductive and productive clear sputum. Patient also reports intermittent chest discomfort that occurs with coughing spells. Patient denies any specific sick contacts or exposures. She reports abdominal discomfort that occurs with vomiting episodes. Last chemotherapy on Tuesday, 4 days prior to this admission.  Pt seen for MST. BMI indicates overweight status. No intakes documented but noted 50% completion of cup of jello on bedside table which pt reports she consumed for breakfast without N/V. She was experiencing N/V for 2 days PTA and last episode of emesis, per visitor at bedside, was last night.   Pt states that she has had poor appetite while undergoing chemo and has been experiencing  taste alteration (bland taste) during this time. Pt became drowsy during the end of discussion and information was obtained from her as well as visitor at bedside. Pt had a stroke ~1 year ago with no residual deficits in chewing or swallowing. Pt was not drinking nutrition supplements such as Ensure or Boost PTA.   She states that she has been losing weight; unable to obtain further information on this from pt and visitor states recent weight of 169 lbs but unsure of other previous weights. Per chart review, pt has lost 2 lbs (1% body weight) over 12 days which is not significant for time frame. Also lost 5 lbs (3% body weight) over the past 3 months which is also not significant for time frame. Did not perform physical assessment at this time with respect to pt's comfort but will do so at follow-up and document findings at that time.  Pt not meeting needs at this time. Medications reviewed; Calcium gluconate and magnesium phosphate ordered for repletion. Labs reviewed; Na: 131 mmol/L, K: 3.4 mmol/L, Cl: 95 mmol/L, Ca: 7.2 mg/dL, Phos: 1.8 mg/dL, Mg: 0.9 mg/dL, creatinine elevated, GFR: 53.   Diet Order:  Diet regular Room service appropriate?: Yes; Fluid consistency:: Thin  Skin:  Reviewed, no issues  Last BM:  1/13  Height:   Ht Readings from Last 1 Encounters:  09/24/15 5\' 4"  (1.626 m)    Weight:   Wt Readings from Last 1 Encounters:  09/26/15 175 lb 8 oz (79.606 kg)    Ideal Body Weight:  54.54 kg (kg)  BMI:  Body mass index is 30.11 kg/(m^2).  Estimated Nutritional Needs:   Kcal:  2300-2500  Protein:  85-95 grams  Fluid:  >/= 2 L/day  EDUCATION NEEDS:   No education needs identified at this time    Jarome Matin, [place], [facility] Inpatient Clinical Dietitian Pager # [phone] After hours/weekend pager # [phone]

## 2015-09-26 NOTE — Progress Notes (Signed)
*  Preliminary Results* Bilateral lower extremity venous duplex completed. Bilateral lower extremities are negative for deep vein thrombosis. There is no evidence of Baker's cyst bilaterally.  09/26/2015  Maudry Mayhew, RVT, RDCS, RDMS

## 2015-09-26 NOTE — Progress Notes (Signed)
  Echocardiogram 2D Echocardiogram has been performed.  Donata Clay 09/26/2015, 3:24 PM

## 2015-09-26 NOTE — Progress Notes (Addendum)
PROGRESS NOTE  Maria Pruitt PRX:[id number] DOB: [birth year] DOA: 09/24/2015 PCP: [name], PA-C  HPI/Recap of past 24 hours:  Somnolence, intermittent dry cough, reported n/v has resolved , able to eat some jellow this morning, no diarrhea, c/o iv potassium burns, son in room  Assessment/Plan: Principal Problem:   Sepsis ([place]) Active Problems:   Atrial fibrillation with RVR, CHADS 2-3  ([place])   Breast cancer of upper-outer quadrant of left female breast ([place])   Breast cancer metastasized to bone ([place])   Thrombocytopenia ([place])   Anemia of chronic disease   Pneumonia of right upper lobe due to infectious organism   Disorder of electrolytes, low Mg, K, Ca, Phosp   Acute kidney injury ([place])  Febrile neutropenia/ Sepsis presented on admission with fever, neutropenic, tachycardia, lactic acidosis. On ivf/abx. Blood culture pending. Last dose of neulasta on 1/10. Oncology notified.  HCAP: continue vanc/zosyn, add xopenex/atrovent due to wheezing on exam, add mucinex, check flu/respiratory virus/urine strepneumo antigen. CTA pending ( tachycardia in the setting of active cancer, will rule out PE)  Addendum: CTA + subsegmental PE, no right heart strain, with pulmonary hypertension, radiation pulmonary fibrosis changes, progressive lymphoadenopathy, no overt sign of pneumonia per phone conversation with radiologist Dr Polly Cobia. Will check venous doppler to r/o DVT. patient is less likely a candidate for anticoagulation due to prior hemorrhagic cva in 1/2016Will discuss with hematology/oncology for possible IVC filter placement i have talked to Dr. Jana Hakim over the phone, he will see patient today and leave recommendation.  Pancytopenia: last chemo on 1/10, monitor counts, no need of transfusion currently, hematology/oncology notified.  AKI: ua unremarkable, likely from sepsis, dehydration, on ivf, renal dosing meds  Transaminitis: likely multifactorial, from sepsis/side effect  from chemo agent eribulin (last dose on 1/10), hepatitis panel and ab [place] pending, avoid hepatotoxin.  Afib/rvr: with history of chronic afib, admitting MD talked to cardiology, currently on cardizem drip and oral metoprolol. Not a candidate for anticoagulation due to history of hemorrhagic cva in 09/2014 ( residual right sided vision impairment). Echo pending.  Hypokalemia/hypomagnesemia/hypophosphotemia/hyponatremia: replace k/mag/phos, on ivf, repeat labs   Stage IV breast cancer with left adrenal metastasis, some regional lympho nodes and multiple bone metastasis, triple negative, her2/neu negative, last chemo on 1/10 with eribulin. Oncology notified.  Code Status: full  Family Communication: patient and son in room  Disposition Plan: remain in med tele bed   Consultants:  Hematology/oncology  Admitting MD had phone conversation with cardiology  Procedures:  none  Antibiotics:  Vanc/zosyn from admission   Objective: BP 127/52 mmHg  Pulse 101  Temp(Src) 102.2 F (39 C) (Oral)  Resp 20  Ht 5' 4"  (1.626 m)  Wt 79.606 kg (175 lb 8 oz)  BMI 30.11 kg/m2  SpO2 97%  Intake/Output Summary (Last 24 hours) at 09/26/15 0906 Last data filed at 09/26/15 0658  Gross per 24 hour  Intake    730 ml  Output   1900 ml  Net  -1170 ml   Filed Weights   09/24/15 2000 09/26/15 0222  Weight: 77.1 kg (169 lb 15.6 oz) 79.606 kg (175 lb 8 oz)    Exam:   General:  Lethargic, but oriented x4, pale, denies pain, no mucositis.   Cardiovascular: IRRR  Respiratory: course/ wheezing  Abdomen: Soft/ND/NT, positive BS  Musculoskeletal: No Edema  Neuro: lethargic, oriented x4, residual right sided vision impairment from prior CVa, no focal weakness.  SkiN; no rash, no ecchymosis, no petechiae   Data Reviewed: Basic  Metabolic Panel:  Recent Labs Lab 09/20/15 1450 09/24/15 1530 09/24/15 2125 09/25/15 0245 09/25/15 1125 09/26/15 0320  NA 127* 131*  --  135  --  131*  K  3.2* 2.6*  --  2.7*  --  3.4*  CL  --  93*  --  100*  --  95*  CO2 23 25  --  23  --  24  GLUCOSE 124 252*  --  205*  --  124*  BUN 18.3 13  --  11  --  7  CREATININE 1.0 1.51*  --  1.35*  --  1.06*  CALCIUM 8.5 7.9*  --  7.4*  --  7.2*  MG  --   --  0.9*  --  1.1* 0.9*  PHOS  --   --  1.1*  --  1.4* 1.8*   Liver Function Tests:  Recent Labs Lab 09/20/15 1450 09/24/15 1530 09/25/15 0950 09/26/15 0320  AST 74* 127* 98* 103*  ALT 107* 123* 94* 88*  ALKPHOS 187* 198* 152* 160*  BILITOT 2.22* 4.2* 4.2* 4.8*  PROT 7.0 6.1* 5.2* 5.5*  ALBUMIN 3.3* 3.1* 2.5* 2.6*   No results for input(s): LIPASE, AMYLASE in the last 168 hours.  Recent Labs Lab 09/24/15 1530  AMMONIA 43*   CBC:  Recent Labs Lab 09/20/15 1450 09/24/15 1530 09/25/15 0245 09/26/15 0320  WBC 3.1* 1.2* 1.0* 2.1*  NEUTROABS 1.7 0.7*  --   --   HGB 13.1 11.1* 9.8* 10.5*  HCT 37.7 30.7* 27.1* 28.9*  MCV 103.6* 98.7 100.4* 101.0*  PLT 220 182 156 148*   Cardiac Enzymes:    Recent Labs Lab 09/25/15 1125  TROPONINI 0.08*   BNP (last 3 results) No results for input(s): BNP in the last 8760 hours.  ProBNP (last 3 results) No results for input(s): PROBNP in the last 8760 hours.  CBG: No results for input(s): GLUCAP in the last 168 hours.  Recent Results (from the past 240 hour(s))  Culture, blood (Routine X 2) w Reflex to ID Panel     Status: None (Preliminary result)   Collection Time: 09/24/15  3:25 PM  Result Value Ref Range Status   Specimen Description   Final    BLOOD PORTA CATH BOTTLES DRAWN AEROBIC AND ANAEROBIC   Special Requests 10CC  Final   Culture   Final    NO GROWTH < 24 HOURS Performed at [facility]    Report Status PENDING  Incomplete  Culture, blood (Routine X 2) w Reflex to ID Panel     Status: None (Preliminary result)   Collection Time: 09/24/15  4:00 PM  Result Value Ref Range Status   Specimen Description BLOOD BLOOD RIGHT HAND  Final   Special Requests BOTTLES  DRAWN AEROBIC AND ANAEROBIC 5CC  Final   Culture   Final    NO GROWTH < 24 HOURS Performed at [facility]    Report Status PENDING  Incomplete  Culture, sputum-assessment     Status: None   Collection Time: 09/25/15 11:10 PM  Result Value Ref Range Status   Specimen Description SPU  Final   Special Requests NONE  Final   Sputum evaluation   Final    MICROSCOPIC FINDINGS SUGGEST THAT THIS SPECIMEN IS NOT REPRESENTATIVE OF LOWER RESPIRATORY SECRETIONS. PLEASE RECOLLECT. HOLT,B RN [phone] [id number] [place],N    Report Status 09/26/2015 FINAL  Final     Studies: No results found.  Scheduled Meds: . hydrALAZINE  10 mg  Oral 3 times per day  . levothyroxine  88 mcg Oral QAC breakfast  . metoprolol  50 mg Oral BID  . piperacillin-tazobactam (ZOSYN)  IV  3.375 g Intravenous Q8H  . potassium chloride  10 mEq Intravenous Once  . sodium chloride  10-40 mL Intracatheter Q12H  . sodium chloride  3 mL Intravenous Q12H  . vancomycin  750 mg Intravenous Q12H    Continuous Infusions: . sodium chloride 30 mL/hr at 09/25/15 1000  . diltiazem (CARDIZEM) infusion 5 mg/hr (09/25/15 2306)     Time spent: 37mns  Everardo Voris MD, PhD  [facility] Pager [phone] If 7PM-7AM, please contact night-coverage at [email or web address], password [facility]1/16/2017, 9:06 AM  LOS: 2 days

## 2015-09-27 ENCOUNTER — Other Ambulatory Visit: Payer: Self-pay

## 2015-09-27 ENCOUNTER — Inpatient Hospital Stay (HOSPITAL_COMMUNITY): Payer: Medicare Other

## 2015-09-27 ENCOUNTER — Encounter (HOSPITAL_COMMUNITY): Payer: Medicare Other

## 2015-09-27 ENCOUNTER — Other Ambulatory Visit: Payer: Self-pay | Admitting: Nurse Practitioner

## 2015-09-27 DIAGNOSIS — C7951 Secondary malignant neoplasm of bone: Secondary | ICD-10-CM

## 2015-09-27 DIAGNOSIS — Z171 Estrogen receptor negative status [ER-]: Secondary | ICD-10-CM

## 2015-09-27 DIAGNOSIS — R740 Nonspecific elevation of levels of transaminase and lactic acid dehydrogenase [LDH]: Secondary | ICD-10-CM

## 2015-09-27 DIAGNOSIS — N179 Acute kidney failure, unspecified: Secondary | ICD-10-CM

## 2015-09-27 DIAGNOSIS — I482 Chronic atrial fibrillation: Secondary | ICD-10-CM

## 2015-09-27 DIAGNOSIS — J101 Influenza due to other identified influenza virus with other respiratory manifestations: Secondary | ICD-10-CM

## 2015-09-27 DIAGNOSIS — R509 Fever, unspecified: Secondary | ICD-10-CM | POA: Diagnosis present

## 2015-09-27 DIAGNOSIS — R5081 Fever presenting with conditions classified elsewhere: Secondary | ICD-10-CM

## 2015-09-27 DIAGNOSIS — C50912 Malignant neoplasm of unspecified site of left female breast: Secondary | ICD-10-CM

## 2015-09-27 DIAGNOSIS — I2690 Septic pulmonary embolism without acute cor pulmonale: Secondary | ICD-10-CM | POA: Insufficient documentation

## 2015-09-27 DIAGNOSIS — I2699 Other pulmonary embolism without acute cor pulmonale: Secondary | ICD-10-CM | POA: Diagnosis present

## 2015-09-27 DIAGNOSIS — C7972 Secondary malignant neoplasm of left adrenal gland: Secondary | ICD-10-CM

## 2015-09-27 DIAGNOSIS — R7401 Elevation of levels of liver transaminase levels: Secondary | ICD-10-CM | POA: Diagnosis present

## 2015-09-27 DIAGNOSIS — D709 Neutropenia, unspecified: Secondary | ICD-10-CM | POA: Diagnosis present

## 2015-09-27 DIAGNOSIS — D638 Anemia in other chronic diseases classified elsewhere: Secondary | ICD-10-CM

## 2015-09-27 LAB — MRSA PCR SCREENING: MRSA by PCR: NEGATIVE

## 2015-09-27 LAB — HEPATITIS PANEL, ACUTE
HCV Ab: <0.1 s/co ratio (ref 0.0–0.9)
HEP A IGM: NEGATIVE
HEP B C IGM: NEGATIVE
Hepatitis B Surface Ag: NEGATIVE

## 2015-09-27 LAB — EXPECTORATED SPUTUM ASSESSMENT W GRAM STAIN, RFLX TO RESP C

## 2015-09-27 LAB — EXPECTORATED SPUTUM ASSESSMENT W REFEX TO RESP CULTURE

## 2015-09-27 LAB — CBC
HCT: 25.9 % — ABNORMAL LOW (ref 36.0–46.0)
Hemoglobin: 9.3 g/dL — ABNORMAL LOW (ref 12.0–15.0)
MCH: 36.6 pg — AB (ref 26.0–34.0)
MCHC: 35.9 g/dL (ref 30.0–36.0)
MCV: 102 fL — AB (ref 78.0–100.0)
PLATELETS: 118 10*3/uL — AB (ref 150–400)
RBC: 2.54 MIL/uL — ABNORMAL LOW (ref 3.87–5.11)
RDW: 14.2 % (ref 11.5–15.5)
WBC: 1.7 10*3/uL — AB (ref 4.0–10.5)

## 2015-09-27 LAB — RESPIRATORY VIRUS PANEL
Adenovirus: NEGATIVE
INFLUENZA A: POSITIVE — AB
Influenza B: NEGATIVE
Metapneumovirus: NEGATIVE
PARAINFLUENZA 2 A: NEGATIVE
PARAINFLUENZA 3 A: NEGATIVE
Parainfluenza 1: NEGATIVE
Respiratory Syncytial Virus A: NEGATIVE
Respiratory Syncytial Virus B: NEGATIVE
Rhinovirus: NEGATIVE

## 2015-09-27 LAB — BASIC METABOLIC PANEL
Anion gap: 10 (ref 5–15)
BUN: 8 mg/dL (ref 6–20)
CALCIUM: 6.5 mg/dL — AB (ref 8.9–10.3)
CO2: 22 mmol/L (ref 22–32)
CREATININE: 1.34 mg/dL — AB (ref 0.44–1.00)
Chloride: 100 mmol/L — ABNORMAL LOW (ref 101–111)
GFR calc Af Amer: 46 mL/min — ABNORMAL LOW (ref >60)
GFR, EST NON AFRICAN AMERICAN: 40 mL/min — AB (ref >60)
GLUCOSE: 101 mg/dL — AB (ref 65–99)
Potassium: 3.5 mmol/L (ref 3.5–5.1)
SODIUM: 132 mmol/L — AB (ref 135–145)

## 2015-09-27 LAB — MAGNESIUM: Magnesium: 1.1 mg/dL — ABNORMAL LOW (ref 1.7–2.4)

## 2015-09-27 LAB — PHOSPHORUS: Phosphorus: 2.2 mg/dL — ABNORMAL LOW (ref 2.5–4.6)

## 2015-09-27 MED ORDER — SODIUM CHLORIDE 0.9 % IV BOLUS (SEPSIS)
1000.0000 mL | Freq: Once | INTRAVENOUS | Status: AC
  Administered 2015-09-27: 1000 mL via INTRAVENOUS

## 2015-09-27 MED ORDER — DIPHENHYDRAMINE HCL 50 MG PO CAPS
50.0000 mg | ORAL_CAPSULE | Freq: Once | ORAL | Status: AC
  Administered 2015-09-27: 50 mg via ORAL
  Filled 2015-09-27: qty 1

## 2015-09-27 MED ORDER — HYDROCORTISONE NA SUCCINATE PF 100 MG IJ SOLR
100.0000 mg | Freq: Three times a day (TID) | INTRAMUSCULAR | Status: DC
  Administered 2015-09-27 – 2015-09-30 (×10): 100 mg via INTRAVENOUS
  Filled 2015-09-27 (×11): qty 2

## 2015-09-27 MED ORDER — MAGNESIUM SULFATE 2 GM/50ML IV SOLN
2.0000 g | Freq: Once | INTRAVENOUS | Status: AC
  Administered 2015-09-27: 2 g via INTRAVENOUS
  Filled 2015-09-27: qty 50

## 2015-09-27 MED ORDER — POTASSIUM CHLORIDE CRYS ER 20 MEQ PO TBCR
40.0000 meq | EXTENDED_RELEASE_TABLET | Freq: Once | ORAL | Status: AC
  Administered 2015-09-27: 40 meq via ORAL
  Filled 2015-09-27: qty 2

## 2015-09-27 MED ORDER — NYSTATIN 100000 UNIT/ML MT SUSP
5.0000 mL | Freq: Four times a day (QID) | OROMUCOSAL | Status: DC
  Administered 2015-09-27 – 2015-09-28 (×3): 500000 [IU] via ORAL
  Filled 2015-09-27 (×5): qty 5

## 2015-09-27 MED ORDER — PANTOPRAZOLE SODIUM 40 MG PO TBEC
40.0000 mg | DELAYED_RELEASE_TABLET | Freq: Every day | ORAL | Status: DC
  Administered 2015-09-27 – 2015-09-28 (×2): 40 mg via ORAL
  Filled 2015-09-27: qty 1

## 2015-09-27 MED ORDER — SODIUM CHLORIDE 0.9 % IV BOLUS (SEPSIS)
500.0000 mL | Freq: Once | INTRAVENOUS | Status: AC
  Administered 2015-09-27: 500 mL via INTRAVENOUS

## 2015-09-27 MED ORDER — METOPROLOL TARTRATE 25 MG PO TABS
12.5000 mg | ORAL_TABLET | Freq: Two times a day (BID) | ORAL | Status: DC
  Administered 2015-09-27 – 2015-09-28 (×2): 12.5 mg via ORAL
  Filled 2015-09-27: qty 1

## 2015-09-27 MED ORDER — DIPHENHYDRAMINE HCL 25 MG PO CAPS
25.0000 mg | ORAL_CAPSULE | ORAL | Status: DC | PRN
  Administered 2015-09-28 – 2015-10-01 (×3): 25 mg via ORAL
  Filled 2015-09-27 (×3): qty 1

## 2015-09-27 NOTE — Progress Notes (Signed)
Maria Pruitt   DOB:1946/04/21   TO#:[id number]   XIP#:[id number]   The following is a summary of the patient's prior intracerebral hemorrahe by her neurologist at [facility], Dr Cherly Beach, February 2016: "Ms. Suthers was seen for follow-up in the hospital. The patient is a 70 year old with a history of atrial fibrillation who presented initially to [facility] near [facility] with onset of headache, blurred vision and generalized weakness. Head CT demonstrated a left occipital intracerebral hemorrhage. Patient was treated with Eppie Gibson and flown to [facility]. Serial head CTs at that location showed enlargement of intracerebral hemorrhage with some intraventricular extension and slight hydrocephalus. Patient was intubated and air transported to [facility]. MRI of the brain demonstrated a large area of hemorrhage demonstrated by susceptibility artifact in the left parietal occipital region with mass effect. Susceptibility artifact was also layered in the right occipital horn of the lateral ventricle. Diffusion-weighted imaging demonstrated susceptibility artifact around the area of hemorrhage and rostral to it. Additionally, small punctate areas of restricted diffusion were also seen in both hemispheres including the splenium of the corpus callosum. MRA of the brain showed no aneurysm but some displacement of the left posterior cerebral artery. MR venogram was unremarkable. Transthoracic echocardiogram showed a normal left ventricular ejection fraction and no embolic source. Continuous EEG video monitoring demonstrated several seizures at least one in the left occipital region. Patient was treated with anticonvulsant medications and currently is continued on Vimpat. In September 2015 the patient was evaluated for headache and sensory symptoms. MRI at that time was unremarkable and showed no restricted diffusion. Hemorrhage remained stable and the patient did not require  surgical intervention. Currently doing well with some loss of peripheral vision on the right. She continues on Vimpat. She apparently has not been in atrial fibrillation since her hospitalization."  Dr Blenda Nicely felt the most likely mechanism was a cardioembolic Left parieto-occipital stroke with hemorrhagic transformation.  Maria Pruitt now has a Left subsegmental pulmonary embolus. Her echo yesterday shows a normal RV w/o mention of RV thrombus, however the study was non-contrast. RV thrombi can occur in the setting of AFib (2-5% in one study). However DVTs and AFib share many predisposing factors and the incidence of DVT in patients with AFib and cancer is higher. Accordingly even though the dopplers did not show DVT 09/26/2015 I feel that is the more likely source in this patient. She remains at risk (unconctrolled cancer, recurrent Afib, age, relative immobility, etc) and I would favor placement of an IVC filter since full anticoagulation remains problematic (history of IC hemorrhage, chemo which may lower platelet count, single kidney).   However I would like to discuss this more fully with the patient-- if she decided she does not want any further treatment for her cancer we would instead move to a comfort/palliative mode under Hospice care.   I hope to be able to have this discussion with the patient later today. In the meantime I feel low-dose lovenox is prudent. Addendum to follow  Objective:  Filed Vitals:   09/27/15 0107 09/27/15 0645  BP: 97/82 80/39  Pulse:    Temp:  98.3 F (36.8 C)  Resp:  20    Body mass index is 30.33 kg/(m^2).  Intake/Output Summary (Last 24 hours) at 09/27/15 0821 Last data filed at 09/27/15 0700  Gross per 24 hour  Intake   2070 ml  Output    900 ml  Net   1170 ml     CBG (last 3)  No results for input(s): GLUCAP in the last 72 hours.   Labs:  Lab Results  Component Value Date   WBC 1.7* 09/27/2015   HGB 9.3* 09/27/2015   HCT 25.9* 09/27/2015    MCV 102.0* 09/27/2015   PLT 118* 09/27/2015   NEUTROABS 0.7* 09/24/2015    @LASTCHEMISTRY @  Urine Studies No results for input(s): UHGB, CRYS in the last 72 hours.  Invalid input(s): UACOL, UAPR, USPG, UPH, UTP, UGL, [place], UBIL, UNIT, UROB, [place], UEPI, [place], [place]  Basic Metabolic Panel:  Recent Labs Lab 09/24/15 1530 09/24/15 2125 09/25/15 0245 09/25/15 1125 09/26/15 0320 09/26/15 1050 09/27/15 0330  NA 131*  --  135  --  131* 127* 132*  K 2.6*  --  2.7*  --  3.4* 3.2* 3.5  CL 93*  --  100*  --  95* 93* 100*  CO2 25  --  23  --  24 23 22   GLUCOSE 252*  --  205*  --  124* 119* 101*  BUN 13  --  11  --  7 7 8   CREATININE 1.51*  --  1.35*  --  1.06* 1.06* 1.34*  CALCIUM 7.9*  --  7.4*  --  7.2* 6.9* 6.5*  MG  --  0.9*  --  1.1* 0.9* 1.3* 1.1*  PHOS  --  1.1*  --  1.4* 1.8*  --  2.2*   GFR Estimated Creatinine Clearance: 41.7 mL/min (by C-G formula based on Cr of 1.34). Liver Function Tests:  Recent Labs Lab 09/20/15 1450 09/24/15 1530 09/25/15 0950 09/26/15 0320  AST 74* 127* 98* 103*  ALT 107* 123* 94* 88*  ALKPHOS 187* 198* 152* 160*  BILITOT 2.22* 4.2* 4.2* 4.8*  PROT 7.0 6.1* 5.2* 5.5*  ALBUMIN 3.3* 3.1* 2.5* 2.6*   No results for input(s): LIPASE, AMYLASE in the last 168 hours.  Recent Labs Lab 09/24/15 1530  AMMONIA 43*   Coagulation profile  Recent Labs Lab 09/24/15 2125  INR 1.20    CBC:  Recent Labs Lab 09/20/15 1450 09/24/15 1530 09/25/15 0245 09/26/15 0320 09/27/15 0330  WBC 3.1* 1.2* 1.0* 2.1* 1.7*  NEUTROABS 1.7 0.7*  --   --   --   HGB 13.1 11.1* 9.8* 10.5* 9.3*  HCT 37.7 30.7* 27.1* 28.9* 25.9*  MCV 103.6* 98.7 100.4* 101.0* 102.0*  PLT 220 182 156 148* 118*   Cardiac Enzymes:  Recent Labs Lab 09/25/15 1125  TROPONINI 0.08*   BNP: Invalid input(s): POCBNP CBG: No results for input(s): GLUCAP in the last 168 hours. D-Dimer No results for input(s): DDIMER in the last 72 hours. Hgb A1c No  results for input(s): HGBA1C in the last 72 hours. Lipid Profile No results for input(s): CHOL, HDL, LDLCALC, TRIG, CHOLHDL, LDLDIRECT in the last 72 hours. Thyroid function studies  Recent Labs  09/24/15 2125  TSH 0.066*   Anemia work up No results for input(s): VITAMINB12, FOLATE, FERRITIN, TIBC, IRON, RETICCTPCT in the last 72 hours. Microbiology Recent Results (from the past 240 hour(s))  Culture, blood (Routine X 2) w Reflex to ID Panel     Status: None (Preliminary result)   Collection Time: 09/24/15  3:25 PM  Result Value Ref Range Status   Specimen Description   Final    BLOOD PORTA CATH BOTTLES DRAWN AEROBIC AND ANAEROBIC   Special Requests 10CC  Final   Culture   Final    NO GROWTH 2 DAYS Performed at [facility]  Report Status PENDING  Incomplete  Culture, blood (Routine X 2) w Reflex to ID Panel     Status: None (Preliminary result)   Collection Time: 09/24/15  4:00 PM  Result Value Ref Range Status   Specimen Description BLOOD BLOOD RIGHT HAND  Final   Special Requests BOTTLES DRAWN AEROBIC AND ANAEROBIC 5CC  Final   Culture   Final    NO GROWTH 2 DAYS Performed at [facility]    Report Status PENDING  Incomplete  Urine culture     Status: None   Collection Time: 09/25/15 12:13 AM  Result Value Ref Range Status   Specimen Description URINE, CLEAN CATCH  Final   Special Requests NONE  Final   Culture   Final    NO GROWTH 1 DAY Performed at [facility]    Report Status 09/26/2015 FINAL  Final  Culture, sputum-assessment     Status: None   Collection Time: 09/25/15 11:10 PM  Result Value Ref Range Status   Specimen Description SPU  Final   Special Requests NONE  Final   Sputum evaluation   Final    MICROSCOPIC FINDINGS SUGGEST THAT THIS SPECIMEN IS NOT REPRESENTATIVE OF LOWER RESPIRATORY SECRETIONS. PLEASE RECOLLECT. HOLT,B RN [id number] [place],N    Report Status 09/26/2015 FINAL  Final      Studies:  Ct Angio Chest  Pe W/cm &/or Wo Cm  09/26/2015  CLINICAL DATA:  Inpatient. Shortness of breath. Metastatic breast cancer. EXAM: CT ANGIOGRAPHY CHEST WITH CONTRAST TECHNIQUE: Multidetector CT imaging of the chest was performed using the standard protocol during bolus administration of intravenous contrast. Multiplanar CT image reconstructions and MIPs were obtained to evaluate the vascular anatomy. CONTRAST:  116m OMNIPAQUE IOHEXOL 350 MG/ML SOLN COMPARISON:  06/17/2015 PET-CT . FINDINGS: Mediastinum/Nodes: The study is high quality for the evaluation of pulmonary embolism. There is an acute subsegmental pulmonary embolus in the left lower lobe (series 7/ image 135). No additional pulmonary emboli. Mildly atherosclerotic nonaneurysmal thoracic aorta. Dilated main pulmonary artery (3.5 cm diameter), not appreciably changed from 06/17/2015. Normal heart size. No CT evidence of right heart strain. No pericardial fluid/thickening. Right internal jugular MediPort terminates at the cavoatrial junction. Normal visualized thyroid. Normal esophagus. Left axillary surgical clips are again noted. No axillary adenopathy. There are new mildly enlarged right supraclavicular lymph nodes, largest 1.0 cm (series 5/image 6). There is a mildly enlarged 1.0 cm right paratracheal node (series 5/image 22), mildly increased from 0.9 cm. No additional pathologically enlarged mediastinal or hilar nodes. Lungs/Pleura: No pneumothorax. No pleural effusion. Stable 4 mm anterior right lower lobe pulmonary nodule (series 8/ image 45). Subcentimeter calcified granuloma in the right lower lobe. Stable sharply marginated reticulation and ground-glass opacity in the anterior left upper lobe in keeping with radiation fibrosis. No acute consolidative airspace disease or new significant pulmonary nodules. Upper abdomen: Small hiatal hernia. Partially visualized left adrenal metastasis. Musculoskeletal: Sclerotic lesion in the T9 vertebral body appears slightly  increased in size. Mild-to-moderate degenerative changes in the thoracic spine. Intact appearing bilateral breast prostheses. Review of the MIP images confirms the above findings. IMPRESSION: 1. Acute subsegmental left lower lobe pulmonary embolus. 2. Stable chronic main pulmonary artery dilation, suggesting chronic pulmonary arterial hypertension. 3. No CT evidence of right heart strain. 4. New mild right supraclavicular and right paratracheal mediastinal lymphadenopathy, suspicious for metastatic disease. 5. Slight growth of T9 vertebral body sclerotic lesion, probably a bone metastasis . 6. Partially visualized left adrenal metastasis. 7. Stable tiny  right lower lobe pulmonary nodule. Critical Value/emergent results were called by telephone at the time of interpretation on 09/26/2015 at 11:05 am to Dr. Florencia Reasons , who verbally acknowledged these results. Electronically Signed   By: Ilona Sorrel M.D.   On: 09/26/2015 11:13    Assessment: 70 y.o. [place], [place] woman  (1) status post left breast biopsy in February of 2011 for an invasive lobular carcinoma measuring 2.9 cm by MRI, with a positive prechemotherapy axillary lymph node biopsy, strongly estrogen and progesterone receptor positive with no evidence of HER-2/neu amplification and an MIB-1 of 14%.   (2) received 6 cycles of docetaxel/ cyclophosphamide in the neoadjuvant setting, completed in mid July of 2011   (3) s/p left lumpectomy and axillary lymph node dissection in August of 2011 for a ypT1b yTN3 (14 positive lymph nodes), Stage IIIC, grade 1 residual tumor. HER-2/neu was repeated, again not amplified.   (4) Completed loco-regional radiation in late October of 2011   (5) on letrozole starting early November 2011, continued to October 2016, with progression  METASTATIC DISEASE: September 2016: Involving left adrenal gland, regional nodes and bones  (6) biopsy of a 7.5 cm left adrenal mass 05/30/2015 showed metastatic carcinoma,  estrogen, progesterone, and HER-2 negative, with an MIB-1 of 90-100%; the tumor cells were cytokeratin 7 positive, cytokeratin 20 focally positive, gross cystic disease fluid protein negative (a) the CA-27-29 is informative (b) PET scan 06/17/2015 shows, in addition to the left adrenal mass, some periaortic adenopathy and multiple hypermetabolic bone metastases (L2, T9, T2 and others) (c) EGD on 06/16/2015 to evaluate suspicious cardiac wall thickening showed no evidence of malignancy  (7) capecitabine started 07/04/2015 at 1.5 g BID 7/7. Discontinued 08/29/15 with progression  (8) denosumab/ Xgeva started 07/18/2015, repeated monthly  (9) eribulin D1/D8 q 21 days started 09/14/15    [name], MD 09/27/2015  8:21 AM Medical Oncology and Hematology [facility] [place], [place] [address] Tel. [phone]    Fax. [phone]

## 2015-09-27 NOTE — Care Management Important Message (Addendum)
Important Message  Patient Details IM Letter given to Kathy/Case Manager to present to Patient Name: Maria Pruitt MRN: [id number] Date of Birth: 06/12/1946   Medicare Important Message Given:  Yes    Camillo Flaming 09/27/2015, 9:39 AMImportant Message  Patient Details  Name: Maria Pruitt MRN: [id number] Date of Birth: 05-21-1946   Medicare Important Message Given:  Yes    Camillo Flaming 09/27/2015, 9:39 AM

## 2015-09-27 NOTE — H&P (Signed)
Chief Complaint: Patient was seen in consultation today for consideration of placement of IVC filter. Pulmonary Embolisam  Referring Physician(s): Magrinat  History of Present Illness: Maria Pruitt is a 70 y.o. female who was admitted with several days duration of progressively worsening weakness, nausea and nonbloody vomiting, poor oral intake and fatigue.   On admission she also c/o fevers and chills, cough mixed nonproductive and productive clear sputum.   Patient also reported intermittent chest discomfort that occurs with coughing spells.   She is known to our service as she had placement of a Port A cath by Dr. Earleen Newport on 09/08/2015 for chemotherapy for metastatic breast cancer.  She has history of Afib with previous warfarin use.   According to her son, she had a hemorrhagic stroke while in [facility] and the warfarin was D/C'd  CTA of the chest done 09/26/2015 =  1.Acute subsegmental left lower lobe pulmonary embolus. 2. Stable chronic main pulmonary artery dilation, suggesting chronic pulmonary arterial hypertension. 3. No CT evidence of right heart strain. 4. New mild right supraclavicular and right paratracheal mediastinal lymphadenopathy, suspicious for metastatic disease.  Chart review reveals that the patient presented initially to [facility] near [place] in February of 2016 with onset of headache, blurred vision and generalized weakness. Head CT demonstrated a left occipital intracerebral hemorrhage. Patient was treated with Eppie Gibson and flown to [facility].   Serial head CTs at that location showed enlargement of intracerebral hemorrhage with some intraventricular extension and slight hydrocephalus. Patient was intubated and air transported to [facility].   MRI of the brain demonstrated a large area of hemorrhage demonstrated by susceptibility artifact in the left parietal occipital region with mass effect.  Susceptibility artifact was also layered in the right occipital horn of the lateral ventricle. Diffusion-weighted imaging demonstrated susceptibility artifact around the area of hemorrhage and rostral to it. Additionally, small punctate areas of restricted diffusion were also seen in both hemispheres including the splenium of the corpus callosum.   MRA of the brain showed no aneurysm but some displacement of the left posterior cerebral artery. MR venogram was unremarkable. Transthoracic echocardiogram showed a normal left ventricular ejection fraction and no embolic source. Continuous EEG video monitoring demonstrated several seizures at least one in the left occipital region. Patient was treated with anticonvulsant medications and currently is continued on Vimpat.   In September 2015 the patient was evaluated for headache and sensory symptoms. MRI at that time was unremarkable and showed no restricted diffusion. Hemorrhage remained stable and the patient did not require surgical intervention. Currently doing well with some loss of peripheral vision on the right.  We are asked to evaluate Maria Pruitt for placement of an inferior vena cava filter.  Venous doppler shows no DVT in either lower extremity.  The patient is alert and oriented and can give history, however she is not feeling well and her son is present at the bedside and helps with history.    He does report that she has breast implants in and "often they cause a shadow and she is diagnosed with pneumonia when she really doesn't have it".   Past Medical History  Diagnosis Date  . Hypertension   . Breast cancer ([place]) 2011    Stage 3  . Atrial fibrillation ([place])   . Hypercholesterolemia   . Abnormal ECG   . Allergy     seasonal  . Arthritis   . GERD (gastroesophageal reflux disease)   . 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 Seizures ([place])   . Stroke ([place])     dec 15  . Thyroid disease     Past Surgical  History  Procedure Laterality Date  . Cesarean section  1980  . Breast enhancement surgery  1987  . Umbilical hernia repair    . Tubal ligation    . Scar revision    . Pituitary surgery      adenoma  . Rhinoplasty  1985  . Breast lumpectomy Left   . Colonoscopy      Allergies: Ciprofloxacin; Clonidine derivatives; Phenytoin; Sulfa antibiotics; Benzonatate; and Codeine  Medications: Prior to Admission medications   Medication Sig Start Date End Date Taking? Authorizing Provider  acetaminophen (TYLENOL) 500 MG tablet Take 500 mg by mouth every 6 (six) hours as needed for mild pain. Reported on 09/20/2015   Yes Historical Provider, MD  ALPRAZolam Duanne Moron) 0.25 MG tablet Take 0.25 mg by mouth 2 (two) times daily as needed for anxiety. Reported on 09/20/2015 10/21/14 10/21/15 Yes Historical Provider, MD  Ascorbic Acid (VITAMIN C) 100 MG tablet Take 1,000 mg by mouth daily.    Yes Historical Provider, MD  aspirin (ASPIRIN CHILDRENS) 81 MG chewable tablet Chew 1 tablet (81 mg total) by mouth daily. 11/04/14  Yes [name], MD  atorvastatin (LIPITOR) 40 MG tablet Take 1 tablet (40 mg total) by mouth daily. 11/04/14  Yes [name], MD  hydrALAZINE (APRESOLINE) 10 MG tablet Take 10 mg by mouth 4 (four) times daily as needed. FOR SBP>200 or DBP>110 12/09/14  Yes Historical Provider, MD  hydrochlorothiazide (HYDRODIURIL) 25 MG tablet Take 1 tablet (25 mg total) by mouth daily. 06/08/15  Yes [name], MD  levothyroxine (SYNTHROID, LEVOTHROID) 100 MCG tablet Take 88 mcg by mouth daily before breakfast. 05/15/13  Yes Peter M Martinique, MD  lidocaine-prilocaine (EMLA) cream Apply to affected area once 09/09/15  Yes Laurie Panda, NP  loratadine (CLARITIN) 10 MG tablet Take 10 mg by mouth daily.   Yes Historical Provider, MD  losartan (COZAAR) 50 MG tablet Take 50 mg by mouth daily.   Yes Historical Provider, MD  metoprolol (LOPRESSOR) 50 MG tablet Take 50 mg by mouth 2 (two) times  daily.   Yes Historical Provider, MD  ondansetron (ZOFRAN) 8 MG tablet Take 1 tablet (8 mg total) by mouth 2 (two) times daily. Start the day after chemo for 2 days. Then take as needed for nausea or vomiting. 09/09/15  Yes Laurie Panda, NP  ondansetron (ZOFRAN-ODT) 8 MG disintegrating tablet Take 1 tablet (8 mg total) by mouth 2 (two) times daily as needed for nausea or vomiting. 08/29/15  Yes Laurie Panda, NP  capecitabine (XELODA) 500 MG tablet Take 3 tablets (1,500 mg total) by mouth 2 (two) times daily after a meal. Patient not taking: Reported on 09/20/2015 08/12/15   Laurie Panda, NP  famotidine (PEPCID) 20 MG tablet Take 1 tablet (20 mg total) by mouth 2 (two) times daily. Patient not taking: Reported on 09/20/2015 11/04/14   [name], MD  HYDROcodone-acetaminophen (NORCO/VICODIN) 5-325 MG tablet Take 1 tablet by mouth every 6 (six) hours as needed for moderate pain. Patient not taking: Reported on 09/09/2015 08/12/15   Laurie Panda, NP  oxyCODONE (OXY IR/ROXICODONE) 5 MG immediate release tablet Take 1 tablet (5 mg total) by mouth every 4 (four) hours as needed for severe pain. Patient not taking: Reported  on 09/20/2015 09/08/15   Nicholas Lose, MD  prochlorperazine (COMPAZINE) 10 MG tablet Take 1 tablet (10 mg total) by mouth every 6 (six) hours as needed (Nausea or vomiting). Patient not taking: Reported on 09/20/2015 09/09/15   Laurie Panda, NP     Family History  Problem Relation Age of Onset  . Heart disease Mother   . Heart attack Father   . Heart attack Brother   . Colon cancer Neg Hx     Social History   Social History  . Marital Status: Married    Spouse Name: N/A  . Number of Children: N/A  . Years of Education: N/A   Occupational History  . Nurse    Social History Main Topics  . Smoking status: Never Smoker   . Smokeless tobacco: Never Used  . Alcohol Use: No  . Drug Use: No  . Sexual Activity: Not Asked   Other Topics Concern  .  None   Social History Narrative    Review of Systems  Constitutional: Positive for fever, chills, activity change, appetite change and fatigue.  HENT: Positive for congestion.   Respiratory: Positive for cough. Negative for shortness of breath and wheezing.   Cardiovascular: Negative for chest pain.  Gastrointestinal: Negative for nausea, vomiting, abdominal pain and abdominal distention.  Genitourinary: Negative.   Musculoskeletal: Negative.   Skin: Negative.   Neurological: Negative.   Psychiatric/Behavioral: Negative.     Vital Signs: BP 103/42 mmHg  Pulse 84  Temp(Src) 98.4 F (36.9 C) (Oral)  Resp 18  Ht 5\' 4"  (1.626 m)  Wt 176 lb 12.9 oz (80.2 kg)  BMI 30.33 kg/m2  SpO2 99%  Physical Exam  Constitutional: She is oriented to person, place, and time. She appears well-developed and well-nourished.  HENT:  Head: Normocephalic and atraumatic.  Eyes: EOM are normal.  Neck: Normal range of motion. Neck supple.  Cardiovascular: Normal rate, regular rhythm and normal heart sounds.   Abdominal: Soft. Bowel sounds are normal. She exhibits no distension. There is no tenderness.  Musculoskeletal: Normal range of motion. She exhibits no edema or tenderness.  Neurological: She is alert and oriented to person, place, and time.  Skin: Skin is warm and dry.  Psychiatric: She has a normal mood and affect. Her behavior is normal. Judgment and thought content normal.  Vitals reviewed.   Mallampati Score:  MD Evaluation Airway: WNL Heart: WNL Abdomen: WNL Chest/ Lungs: WNL ASA  Classification: 3 Mallampati/Airway Score: Two  Imaging: Ct Angio Chest Pe W/cm &/or Wo Cm  09/26/2015  CLINICAL DATA:  Inpatient. Shortness of breath. Metastatic breast cancer. EXAM: CT ANGIOGRAPHY CHEST WITH CONTRAST TECHNIQUE: Multidetector CT imaging of the chest was performed using the standard protocol during bolus administration of intravenous contrast. Multiplanar CT image reconstructions and  MIPs were obtained to evaluate the vascular anatomy. CONTRAST:  165mL OMNIPAQUE IOHEXOL 350 MG/ML SOLN COMPARISON:  06/17/2015 PET-CT . FINDINGS: Mediastinum/Nodes: The study is high quality for the evaluation of pulmonary embolism. There is an acute subsegmental pulmonary embolus in the left lower lobe (series 7/ image 135). No additional pulmonary emboli. Mildly atherosclerotic nonaneurysmal thoracic aorta. Dilated main pulmonary artery (3.5 cm diameter), not appreciably changed from 06/17/2015. Normal heart size. No CT evidence of right heart strain. No pericardial fluid/thickening. Right internal jugular MediPort terminates at the cavoatrial junction. Normal visualized thyroid. Normal esophagus. Left axillary surgical clips are again noted. No axillary adenopathy. There are new mildly enlarged right supraclavicular lymph nodes, largest 1.0 cm (series  5/image 6). There is a mildly enlarged 1.0 cm right paratracheal node (series 5/image 22), mildly increased from 0.9 cm. No additional pathologically enlarged mediastinal or hilar nodes. Lungs/Pleura: No pneumothorax. No pleural effusion. Stable 4 mm anterior right lower lobe pulmonary nodule (series 8/ image 45). Subcentimeter calcified granuloma in the right lower lobe. Stable sharply marginated reticulation and ground-glass opacity in the anterior left upper lobe in keeping with radiation fibrosis. No acute consolidative airspace disease or new significant pulmonary nodules. Upper abdomen: Small hiatal hernia. Partially visualized left adrenal metastasis. Musculoskeletal: Sclerotic lesion in the T9 vertebral body appears slightly increased in size. Mild-to-moderate degenerative changes in the thoracic spine. Intact appearing bilateral breast prostheses. Review of the MIP images confirms the above findings. IMPRESSION: 1. Acute subsegmental left lower lobe pulmonary embolus. 2. Stable chronic main pulmonary artery dilation, suggesting chronic pulmonary arterial  hypertension. 3. No CT evidence of right heart strain. 4. New mild right supraclavicular and right paratracheal mediastinal lymphadenopathy, suspicious for metastatic disease. 5. Slight growth of T9 vertebral body sclerotic lesion, probably a bone metastasis . 6. Partially visualized left adrenal metastasis. 7. Stable tiny right lower lobe pulmonary nodule. Critical Value/emergent results were called by telephone at the time of interpretation on 09/26/2015 at 11:05 am to Dr. Florencia Reasons , who verbally acknowledged these results. Electronically Signed   By: Ilona Sorrel M.D.   On: 09/26/2015 11:13   US Abdomen Complete  09/24/2015  CLINICAL DATA:  Transaminitis for labs.  Initial encounter. EXAM: ABDOMEN ULTRASOUND COMPLETE COMPARISON:  MRI of the abdomen performed 08/23/2015 FINDINGS: Gallbladder: No gallstones or wall thickening visualized. Mild internal echoes within the gallbladder may reflect mild sludge. No sonographic Murphy sign noted by sonographer. Common bile duct: Diameter: 0.3 cm, within normal limits in caliber. Liver: No focal lesion identified. There is a mildly nodular contour to the liver, raising concern for some degree of hepatic cirrhosis. Within normal limits in parenchymal echogenicity. IVC: Not well characterized due to overlying structures. Pancreas: Visualized portion unremarkable. Spleen: Size and appearance within normal limits. Right Kidney: Length: 14.6 cm. Echogenicity within normal limits. No mass or hydronephrosis visualized. Left Kidney:  Congenitally absent, per patient. Abdominal aorta: Not well characterized distally due to overlying bowel gas. No evidence of aneurysmal dilatation. Other findings: None. IMPRESSION: 1. Mildly nodular contour of the liver raises concern for some degree of hepatic cirrhosis, which may explain the patient's transaminitis. 2. No focal hepatic lesions seen on ultrasound, though if the patient's transaminitis significantly worsens, dynamic liver protocol  MRI or CT could be considered for further evaluation. 3. Mild internal echoes within the gallbladder may reflect mild sludge. Gallbladder otherwise unremarkable. Electronically Signed   By: Garald Balding M.D.   On: 09/24/2015 19:06   Ir Fluoro Guide Cv Line Right  09/08/2015  CLINICAL DATA:  70 year old female with a history of breast cancer. She has been referred for port catheter placement EXAM: IR RIGHT FLOURO GUIDE CV LINE; IR ULTRASOUND GUIDANCE VASC ACCESS RIGHT Date: 09/08/2015 ANESTHESIA/SEDATION: Moderate (conscious) sedation was administered during this procedure. A total of 4.0 mg Versed and 50 mg Fentanyl were administered intravenously. The patient's vital signs were monitored continuously by radiology nursing throughout the course of the procedure. Total sedation time: 25 minutes FLUOROSCOPY TIME:  18 second TECHNIQUE: The procedure, risks, benefits, and alternatives were explained to the patient. Questions regarding the procedure were encouraged and answered. The patient understands and consents to the procedure. Ultrasound survey was performed with images stored and sent  to PACs. The right neck and chest was prepped with chlorhexidine, and draped in the usual sterile fashion using maximum barrier technique (cap and mask, sterile gown, sterile gloves, large sterile sheet, hand hygiene and cutaneous antiseptic). Antibiotic prophylaxis was provided with 2.0g Ancef administered IV one hour prior to skin incision. Local anesthesia was attained by infiltration with 1% lidocaine without epinephrine. Ultrasound demonstrated patency of the right internal jugular vein, and this was documented with an image. Under real-time ultrasound guidance, this vein was accessed with a 21 gauge micropuncture needle and image documentation was performed. A small dermatotomy was made at the access site with an 11 scalpel. A 0.018" wire was advanced into the SVC and used to estimate the length of the internal catheter.  The access needle exchanged for a 65F micropuncture vascular sheath. The 0.018" wire was then removed and a 0.035" wire advanced into the IVC. An appropriate location for the subcutaneous reservoir was selected below the clavicle and an incision was made through the skin and underlying soft tissues. The subcutaneous tissues were then dissected using a combination of blunt and sharp surgical technique and a pocket was formed. A single lumen power injectable portacatheter was then tunneled through the subcutaneous tissues from the pocket to the dermatotomy and the port reservoir placed within the subcutaneous pocket. The venous access site was then serially dilated and a peel away vascular sheath placed over the wire. The wire was removed and the port catheter advanced into position under fluoroscopic guidance. The catheter tip is positioned in the cavoatrial junction. This was documented with a spot image. The portacatheter was then tested and found to flush and aspirate well. The port was flushed with saline followed by 100 units/mL heparinized saline. The pocket was then closed in two layers using first subdermal inverted interrupted absorbable sutures followed by a running subcuticular suture. The epidermis was then sealed with Dermabond. The dermatotomy at the venous access site was also seal with Dermabond. Patient tolerated the procedure well and remained hemodynamically stable throughout. No complications encountered and no significant blood loss encountered COMPLICATIONS: None.  The patient tolerated the procedure well. IMPRESSION: Status post placement of right IJ port.  Catheter ready for use. Signed, Dulcy Fanny. Earleen Newport, DO [facility]iology Electronically Signed   By: Corrie Mckusick D.O.   On: 09/08/2015 18:38   Ir US Guide Vasc Access Right  09/08/2015  CLINICAL DATA:  70 year old female with a history of breast cancer. She has been referred for port  catheter placement EXAM: IR RIGHT FLOURO GUIDE CV LINE; IR ULTRASOUND GUIDANCE VASC ACCESS RIGHT Date: 09/08/2015 ANESTHESIA/SEDATION: Moderate (conscious) sedation was administered during this procedure. A total of 4.0 mg Versed and 50 mg Fentanyl were administered intravenously. The patient's vital signs were monitored continuously by radiology nursing throughout the course of the procedure. Total sedation time: 25 minutes FLUOROSCOPY TIME:  18 second TECHNIQUE: The procedure, risks, benefits, and alternatives were explained to the patient. Questions regarding the procedure were encouraged and answered. The patient understands and consents to the procedure. Ultrasound survey was performed with images stored and sent to PACs. The right neck and chest was prepped with chlorhexidine, and draped in the usual sterile fashion using maximum barrier technique (cap and mask, sterile gown, sterile gloves, large sterile sheet, hand hygiene and cutaneous antiseptic). Antibiotic prophylaxis was provided with 2.0g Ancef administered IV one hour prior to skin incision. Local anesthesia was attained by infiltration with 1% lidocaine without epinephrine. Ultrasound  demonstrated patency of the right internal jugular vein, and this was documented with an image. Under real-time ultrasound guidance, this vein was accessed with a 21 gauge micropuncture needle and image documentation was performed. A small dermatotomy was made at the access site with an 11 scalpel. A 0.018" wire was advanced into the SVC and used to estimate the length of the internal catheter. The access needle exchanged for a 28F micropuncture vascular sheath. The 0.018" wire was then removed and a 0.035" wire advanced into the IVC. An appropriate location for the subcutaneous reservoir was selected below the clavicle and an incision was made through the skin and underlying soft tissues. The subcutaneous tissues were then dissected using a combination of blunt and  sharp surgical technique and a pocket was formed. A single lumen power injectable portacatheter was then tunneled through the subcutaneous tissues from the pocket to the dermatotomy and the port reservoir placed within the subcutaneous pocket. The venous access site was then serially dilated and a peel away vascular sheath placed over the wire. The wire was removed and the port catheter advanced into position under fluoroscopic guidance. The catheter tip is positioned in the cavoatrial junction. This was documented with a spot image. The portacatheter was then tested and found to flush and aspirate well. The port was flushed with saline followed by 100 units/mL heparinized saline. The pocket was then closed in two layers using first subdermal inverted interrupted absorbable sutures followed by a running subcuticular suture. The epidermis was then sealed with Dermabond. The dermatotomy at the venous access site was also seal with Dermabond. Patient tolerated the procedure well and remained hemodynamically stable throughout. No complications encountered and no significant blood loss encountered COMPLICATIONS: None.  The patient tolerated the procedure well. IMPRESSION: Status post placement of right IJ port.  Catheter ready for use. Signed, Dulcy Fanny. Earleen Newport, DO [facility] Radiology Electronically Signed   By: Corrie Mckusick D.O.   On: 09/08/2015 18:38   Dg Chest Port 1 View  09/24/2015  CLINICAL DATA:  Fever.  Breast carcinoma. EXAM: PORTABLE CHEST 1 VIEW COMPARISON:  Jan 16, 2011 FINDINGS: Port-A-Cath tip is in the superior vena cava. No pneumothorax. There is ill-defined opacity in the right upper lobe, concerning for pneumonia. Lungs elsewhere appear clear. Heart is upper normal in size with pulmonary vascularity within normal limits. No apparent adenopathy. There is postoperative change on the left with clips in left axillary region. There are breast implants  bilaterally. No bone lesions. IMPRESSION: Ill-defined opacity right upper lobe, concerning for pneumonia. Lungs elsewhere appear clear. No adenopathy apparent. Electronically Signed   By: Lowella Grip III M.D.   On: 09/24/2015 15:45    Labs:  CBC:  Recent Labs  09/24/15 1530 09/25/15 0245 09/26/15 0320 09/27/15 0330  WBC 1.2* 1.0* 2.1* 1.7*  HGB 11.1* 9.8* 10.5* 9.3*  HCT 30.7* 27.1* 28.9* 25.9*  PLT 182 156 148* 118*    COAGS:  Recent Labs  09/08/15 1203 09/24/15 2125  INR 1.07 1.20  APTT  --  30    BMP:  Recent Labs  09/25/15 0245 09/26/15 0320 09/26/15 1050 09/27/15 0330  NA 135 131* 127* 132*  K 2.7* 3.4* 3.2* 3.5  CL 100* 95* 93* 100*  CO2 23 24 23 22   GLUCOSE 205* 124* 119* 101*  BUN 11 7 7 8   CALCIUM 7.4* 7.2* 6.9* 6.5*  CREATININE 1.35* 1.06* 1.06* 1.34*  GFRNONAA 40* 53* 53* 40*  GFRAA 46* >  60 >60 46*    LIVER FUNCTION TESTS:  Recent Labs  09/20/15 1450 09/24/15 1530 09/25/15 0950 09/26/15 0320  BILITOT 2.22* 4.2* 4.2* 4.8*  AST 74* 127* 98* 103*  ALT 107* 123* 94* 88*  ALKPHOS 187* 198* 152* 160*  PROT 7.0 6.1* 5.2* 5.5*  ALBUMIN 3.3* 3.1* 2.5* 2.6*    TUMOR MARKERS:  Recent Labs  06/08/15 1611  CEA 9.7*  CA199 19.4    Assessment and Plan:  Metastatic Breast Cancer  Pulmonary Embolism  History of A-fib, no longer on warfarin secondary to intracranial hemorrhage.  I had a long discussion with the patient and her son. Risks and Benefits were discussed including, but not limited to bleeding, infection, contrast induced renal failure, filter fracture or migration which can lead to emergency surgery or even death, strut penetration with damage or irritation to adjacent structures and caval thrombosis.  The son does not feel she needs this filter at this time, however, his aunt is a Marine scientist and he would like to talk with her and get her opinion. He says she will be in town tomorrow.  He would also like to discuss this with Dr.  Jana Hakim again before he is agreeable to proceed.  Thank you for this interesting consult.  I greatly enjoyed meeting Maria Pruitt and look forward to participating in their care.  A copy of this report was sent to the requesting provider on this date.  Electronically Signed: Murrell Redden PA-C 09/27/2015, 3:00 PM   I spent a total of 40 Minutes in face to face in clinical consultation, greater than 50% of which was counseling/coordinating care for consideration of IVC Filter.

## 2015-09-27 NOTE — Progress Notes (Signed)
ADDENDUM: Discussed overall situation w patient and son. She intends to continue chemotherapy once she recovers (we will have to drop the dose). We discussed the likely source of her PE and the fact that she will remain at risk of further clots. She is also at risk of bleeding given her IC hemorrhage last year (which the family was told was due to HTN + anticoagulants, while the neurologist felt it was more likely an embolic stroke turned hemorrhagic).  After much discussion we felt the better part of valor would be to proceed to placement of an IVC filter. 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  Will write the order for IVF. Will discuss w Dr Erlinda Hong.

## 2015-09-27 NOTE — Progress Notes (Addendum)
PROGRESS NOTE  Maria Pruitt VOZ:[id number] DOB: 15-May-1946 DOA: 09/24/2015 PCP: [name], PA-C  HPI/Recap of past 24 hours:  bp soft, Fever trending down, heart rate better, reported IV infiltrated, right arm swollen, reported difficulty swallowing pills, son in room  Assessment/Plan: Principal Problem:   Sepsis ([place]) Active Problems:   Atrial fibrillation with RVR, CHADS 2-3  ([place])   Breast cancer of upper-outer quadrant of left female breast ([place])   Breast cancer metastasized to bone ([facility])   Thrombocytopenia ([place])   Anemia of chronic disease   Pneumonia of right upper lobe due to infectious organism   Disorder of electrolytes, low Mg, K, Ca, Phosp   Acute kidney injury ([place])  Febrile neutropenia/ Sepsis presented on admission with fever, neutropenic (wbc 1.0), tachycardia, lactic acidosis.  On ivf/abx. Blood /urine/sputum culture obtained from the ED, no growth so far.  Last dose of neulasta on 1/10.  Fever down, bp soft, ns bolus, add stress dose steroids Narrow abx once clinically more stable.  Influenza A: started tamiflu on 1/16.   HCAP?:  symptom possible from flu,  CTA: radiation pulmonary fibrosis changes, progressive lymphoadenopathy, no overt sign of pneumonia per phone conversation with radiologist Dr Polly Cobia. respiratory virus pcr panel pending, urine strepneumo antigen negative add xopenex/atrovent due to wheezing on exam, add mucinex,  Due to neutropenic, clinically very sick, will continue vanc/zosyn that was started from admission, narrow abx once more stable.    subsegmental PE:  no right heart strain, with pulmonary hypertension,  venous doppler: Lower extremity negative for DVT, right upper extremity doppler pending, does has edema right upper extremity, though likely related to iv infiltration. H/o  prior hemorrhagic cva in 09/2014,  hematology/oncology Dr. Jana Hakim recommended IVC filter placement  Pancytopenia: last chemo on 1/10, monitor  counts, no need of transfusion currently, hematology/oncology notified.  AKI: ua unremarkable, likely from sepsis, dehydration, on ivf, renal dosing meds  Transaminitis: likely multifactorial, from sepsis/side effect from chemo agent eribulin (last dose on 1/10), hepatitis panel negative, ab [place] pending, avoid hepatotoxin. Repeat labs in am , also check ammonia, patient is lethargic on 1/17, though oriented x3.  Afib/rvr: with history of chronic afib, admitting MD talked to cardiology, received cardizem drip and oral metoprolol. Not a candidate for anticoagulation due to history of hemorrhagic cva in 09/2014 ( residual right sided vision impairment). Echo result noted.    Hypokalemia/hypomagnesemia/hypophosphotemia/hyponatremia: replace k/mag/phos, on ivf, repeat labs  Dysphagia?: no mucositis on exam, on aspiration precaution, swallow eval pending   Stage IV breast cancer with left adrenal metastasis, some regional lympho nodes and multiple bone metastasis, triple negative, her2/neu negative, last chemo on 1/10 with eribulin. Oncology notified.  Code Status: full  Family Communication: patient and son in room  Disposition Plan: remain in med tele bed   Consultants:  Hematology/oncology  Admitting MD had phone conversation with cardiology  Procedures:  ivc filter  Antibiotics:  Vanc/zosyn from admission  tamiflu    Objective: BP 80/39 mmHg  Pulse 96  Temp(Src) 98.3 F (36.8 C) (Oral)  Resp 20  Ht _0  (1.626 m)  Wt 80.2 kg (176 lb 12.9 oz)  BMI 30.33 kg/m2  SpO2 93%  Intake/Output Summary (Last 24 hours) at 09/27/15 0809 Last data filed at 09/27/15 0700  Gross per 24 hour  Intake   2070 ml  Output    900 ml  Net   1170 ml   Filed Weights   09/24/15 2000 09/26/15 0222 09/27/15 4259  Weight: 77.1 kg (169 lb 15.6 oz) 79.606 kg (175 lb 8 oz) 80.2 kg (176 lb 12.9 oz)    Exam:   General:  Lethargic, but oriented x4, pale, denies pain, no mucositis.    Cardiovascular: IRRR  Respiratory: course/ wheezing  Abdomen: Soft/ND/NT, positive BS  Musculoskeletal: right upper extremity edema  Neuro: lethargic, oriented x4, residual right sided vision impairment from prior CVa, no focal weakness.  SkiN; no rash, no ecchymosis, no petechiae   Data Reviewed: Basic Metabolic Panel:  Recent Labs Lab 09/24/15 1530 09/24/15 2125 09/25/15 0245 09/25/15 1125 09/26/15 0320 09/26/15 1050 09/27/15 0330  NA 131*  --  135  --  131* 127* 132*  K 2.6*  --  2.7*  --  3.4* 3.2* 3.5  CL 93*  --  100*  --  95* 93* 100*  CO2 25  --  23  --  _0 GLUCOSE 252*  --  205*  --  124* 119* 101*  BUN 13  --  11  --  _1 CREATININE 1.51*  --  1.35*  --  1.06* 1.06* 1.34*  CALCIUM 7.9*  --  7.4*  --  7.2* 6.9* 6.5*  MG  --  0.9*  --  1.1* 0.9* 1.3* 1.1*  PHOS  --  1.1*  --  1.4* 1.8*  --  2.2*   Liver Function Tests:  Recent Labs Lab 09/20/15 1450 09/24/15 1530 09/25/15 0950 09/26/15 0320  AST 74* 127* 98* 103*  ALT 107* 123* 94* 88*  ALKPHOS 187* 198* 152* 160*  BILITOT 2.22* 4.2* 4.2* 4.8*  PROT 7.0 6.1* 5.2* 5.5*  ALBUMIN 3.3* 3.1* 2.5* 2.6*   No results for input(s): LIPASE, AMYLASE in the last 168 hours.  Recent Labs Lab 09/24/15 1530  AMMONIA 43*   CBC:  Recent Labs Lab 09/20/15 1450 09/24/15 1530 09/25/15 0245 09/26/15 0320 09/27/15 0330  WBC 3.1* 1.2* 1.0* 2.1* 1.7*  NEUTROABS 1.7 0.7*  --   --   --   HGB 13.1 11.1* 9.8* 10.5* 9.3*  HCT 37.7 30.7* 27.1* 28.9* 25.9*  MCV 103.6* 98.7 100.4* 101.0* 102.0*  PLT 220 182 156 148* 118*   Cardiac Enzymes:    Recent Labs Lab 09/25/15 1125  TROPONINI 0.08*   BNP (last 3 results) No results for input(s): BNP in the last 8760 hours.  ProBNP (last 3 results) No results for input(s): PROBNP in the last 8760 hours.  CBG: No results for input(s): GLUCAP in the last 168 hours.  Recent Results (from the past 240 hour(s))  Culture, blood (Routine X 2) w Reflex to  ID Panel     Status: None (Preliminary result)   Collection Time: 09/24/15  3:25 PM  Result Value Ref Range Status   Specimen Description   Final    BLOOD PORTA CATH BOTTLES DRAWN AEROBIC AND ANAEROBIC   Special Requests 10CC  Final   Culture   Final    NO GROWTH 2 DAYS Performed at [facility]    Report Status PENDING  Incomplete  Culture, blood (Routine X 2) w Reflex to ID Panel     Status: None (Preliminary result)   Collection Time: 09/24/15  4:00 PM  Result Value Ref Range Status   Specimen Description BLOOD BLOOD RIGHT HAND  Final   Special Requests BOTTLES DRAWN AEROBIC AND ANAEROBIC 5CC  Final   Culture   Final    NO GROWTH 2 DAYS Performed at [facility]    Report Status PENDING  Incomplete  Urine culture     Status: None   Collection Time: 09/25/15 12:13 AM  Result Value Ref Range Status   Specimen Description URINE, CLEAN CATCH  Final   Special Requests NONE  Final   Culture   Final    NO GROWTH 1 DAY Performed at [facility]    Report Status 09/26/2015 FINAL  Final  Culture, sputum-assessment     Status: None   Collection Time: 09/25/15 11:10 PM  Result Value Ref Range Status   Specimen Description SPU  Final   Special Requests NONE  Final   Sputum evaluation   Final    MICROSCOPIC FINDINGS SUGGEST THAT THIS SPECIMEN IS NOT REPRESENTATIVE OF LOWER RESPIRATORY SECRETIONS. PLEASE RECOLLECT. HOLT,B RN [id number] [place],N    Report Status 09/26/2015 FINAL  Final     Studies: Ct Angio Chest Pe W/cm &/or Wo Cm  09/26/2015  CLINICAL DATA:  Inpatient. Shortness of breath. Metastatic breast cancer. EXAM: CT ANGIOGRAPHY CHEST WITH CONTRAST TECHNIQUE: Multidetector CT imaging of the chest was performed using the standard protocol during bolus administration of intravenous contrast. Multiplanar CT image reconstructions and MIPs were obtained to evaluate the vascular anatomy. CONTRAST:  156m OMNIPAQUE IOHEXOL 350 MG/ML SOLN COMPARISON:   06/17/2015 PET-CT . FINDINGS: Mediastinum/Nodes: The study is high quality for the evaluation of pulmonary embolism. There is an acute subsegmental pulmonary embolus in the left lower lobe (series 7/ image 135). No additional pulmonary emboli. Mildly atherosclerotic nonaneurysmal thoracic aorta. Dilated main pulmonary artery (3.5 cm diameter), not appreciably changed from 06/17/2015. Normal heart size. No CT evidence of right heart strain. No pericardial fluid/thickening. Right internal jugular MediPort terminates at the cavoatrial junction. Normal visualized thyroid. Normal esophagus. Left axillary surgical clips are again noted. No axillary adenopathy. There are new mildly enlarged right supraclavicular lymph nodes, largest 1.0 cm (series 5/image 6). There is a mildly enlarged 1.0 cm right paratracheal node (series 5/image 22), mildly increased from 0.9 cm. No additional pathologically enlarged mediastinal or hilar nodes. Lungs/Pleura: No pneumothorax. No pleural effusion. Stable 4 mm anterior right lower lobe pulmonary nodule (series 8/ image 45). Subcentimeter calcified granuloma in the right lower lobe. Stable sharply marginated reticulation and ground-glass opacity in the anterior left upper lobe in keeping with radiation fibrosis. No acute consolidative airspace disease or new significant pulmonary nodules. Upper abdomen: Small hiatal hernia. Partially visualized left adrenal metastasis. Musculoskeletal: Sclerotic lesion in the T9 vertebral body appears slightly increased in size. Mild-to-moderate degenerative changes in the thoracic spine. Intact appearing bilateral breast prostheses. Review of the MIP images confirms the above findings. IMPRESSION: 1. Acute subsegmental left lower lobe pulmonary embolus. 2. Stable chronic main pulmonary artery dilation, suggesting chronic pulmonary arterial hypertension. 3. No CT evidence of right heart strain. 4. New mild right supraclavicular and right paratracheal  mediastinal lymphadenopathy, suspicious for metastatic disease. 5. Slight growth of T9 vertebral body sclerotic lesion, probably a bone metastasis . 6. Partially visualized left adrenal metastasis. 7. Stable tiny right lower lobe pulmonary nodule. Critical Value/emergent results were called by telephone at the time of interpretation on 09/26/2015 at 11:05 am to Dr. FFlorencia Reasons, who verbally acknowledged these results. Electronically Signed   By: JIlona SorrelM.D.   On: 09/26/2015 11:13    Scheduled Meds: . enoxaparin (LOVENOX) injection  40 mg Subcutaneous Q24H  . feeding supplement  1 Container Oral TID BM  . guaiFENesin  600 mg Oral BID  .  hydrocortisone sod succinate (SOLU-CORTEF) inj  100 mg Intravenous Q8H  . ipratropium  0.5 mg Nebulization TID  . levalbuterol  1.25 mg Nebulization TID  . levothyroxine  88 mcg Oral QAC breakfast  . metoprolol  12.5 mg Oral BID  . oseltamivir  30 mg Oral BID  . piperacillin-tazobactam (ZOSYN)  IV  3.375 g Intravenous Q8H  . potassium & sodium phosphates  1 packet Oral TID WC & HS  . potassium chloride  40 mEq Oral Once  . sodium chloride  1,000 mL Intravenous Once  . sodium chloride  10-40 mL Intracatheter Q12H  . sodium chloride  3 mL Intravenous Q12H  . vancomycin  750 mg Intravenous Q12H    Continuous Infusions: . sodium chloride 75 mL/hr at 09/27/15 0100     Time spent: 45mns  Matie Dimaano MD, PhD  [facility] Pager [phone] If 7PM-7AM, please contact night-coverage at [email or web address], password [email or web address]09/27/2015, 8:09 AM  LOS: 3 days

## 2015-09-27 NOTE — Progress Notes (Signed)
1 of Maria Pruitt's sons came by the office to discuss the IV filter further. The family is very reluctant to put her through any further procedures at this point. They understand that she remains at risk of developing other lower extremity clots which can travel to her lungs.   Given the fact that Maria Pruitt is really only marginally able to participate in decision making at this point, I think the best strategy is to wait on the filter and I have gone ahead and cancel the order.

## 2015-09-27 NOTE — Progress Notes (Signed)
Pt BP low, NP on call paged, new orders given. Pt reports not feeling different. Will continue to monitor.  0530-Pt BP 70's/30's, NP on call notified, new orders given. Will Continue to monitor pt closely.

## 2015-09-28 ENCOUNTER — Encounter: Payer: Self-pay | Admitting: Nurse Practitioner

## 2015-09-28 ENCOUNTER — Inpatient Hospital Stay (HOSPITAL_COMMUNITY): Payer: Medicare Other

## 2015-09-28 DIAGNOSIS — K1230 Oral mucositis (ulcerative), unspecified: Secondary | ICD-10-CM | POA: Diagnosis present

## 2015-09-28 DIAGNOSIS — R609 Edema, unspecified: Secondary | ICD-10-CM

## 2015-09-28 LAB — BASIC METABOLIC PANEL
Anion gap: 11 (ref 5–15)
BUN: 7 mg/dL (ref 6–20)
CALCIUM: 6.5 mg/dL — AB (ref 8.9–10.3)
CO2: 21 mmol/L — ABNORMAL LOW (ref 22–32)
Chloride: 104 mmol/L (ref 101–111)
Creatinine, Ser: 0.94 mg/dL (ref 0.44–1.00)
GFR calc Af Amer: >60 mL/min (ref >60)
GLUCOSE: 223 mg/dL — AB (ref 65–99)
POTASSIUM: 3.2 mmol/L — AB (ref 3.5–5.1)
Sodium: 136 mmol/L (ref 135–145)

## 2015-09-28 LAB — CBC
HCT: 28 % — ABNORMAL LOW (ref 36.0–46.0)
Hemoglobin: 10.1 g/dL — ABNORMAL LOW (ref 12.0–15.0)
MCH: 36.7 pg — AB (ref 26.0–34.0)
MCHC: 36.1 g/dL — ABNORMAL HIGH (ref 30.0–36.0)
MCV: 101.8 fL — AB (ref 78.0–100.0)
PLATELETS: 126 10*3/uL — AB (ref 150–400)
RBC: 2.75 MIL/uL — ABNORMAL LOW (ref 3.87–5.11)
RDW: 14.5 % (ref 11.5–15.5)
WBC: 2.3 10*3/uL — ABNORMAL LOW (ref 4.0–10.5)

## 2015-09-28 LAB — HEPATIC FUNCTION PANEL
ALT: 72 U/L — AB (ref 14–54)
AST: 122 U/L — ABNORMAL HIGH (ref 15–41)
Albumin: 2.4 g/dL — ABNORMAL LOW (ref 3.5–5.0)
Alkaline Phosphatase: 184 U/L — ABNORMAL HIGH (ref 38–126)
BILIRUBIN DIRECT: 2.8 mg/dL — AB (ref 0.1–0.5)
BILIRUBIN INDIRECT: 1.7 mg/dL — AB (ref 0.3–0.9)
BILIRUBIN TOTAL: 4.5 mg/dL — AB (ref 0.3–1.2)
Total Protein: 5.3 g/dL — ABNORMAL LOW (ref 6.5–8.1)

## 2015-09-28 LAB — AMMONIA: Ammonia: 49 umol/L — ABNORMAL HIGH (ref 9–35)

## 2015-09-28 LAB — MAGNESIUM: Magnesium: 1.3 mg/dL — ABNORMAL LOW (ref 1.7–2.4)

## 2015-09-28 MED ORDER — DIGOXIN 125 MCG PO TABS
0.1250 mg | ORAL_TABLET | Freq: Once | ORAL | Status: AC
  Administered 2015-09-28: 0.125 mg via ORAL
  Filled 2015-09-28: qty 1

## 2015-09-28 MED ORDER — LACTULOSE 10 GM/15ML PO SOLN
20.0000 g | Freq: Every day | ORAL | Status: DC
  Administered 2015-09-29 – 2015-10-01 (×3): 20 g via ORAL
  Filled 2015-09-28 (×3): qty 30

## 2015-09-28 MED ORDER — METOPROLOL TARTRATE 25 MG PO TABS
25.0000 mg | ORAL_TABLET | Freq: Two times a day (BID) | ORAL | Status: DC
  Administered 2015-09-28 – 2015-10-04 (×11): 25 mg via ORAL
  Filled 2015-09-28 (×13): qty 1

## 2015-09-28 MED ORDER — MAGNESIUM SULFATE 2 GM/50ML IV SOLN
2.0000 g | Freq: Once | INTRAVENOUS | Status: AC
  Administered 2015-09-28: 2 g via INTRAVENOUS
  Filled 2015-09-28: qty 50

## 2015-09-28 MED ORDER — MAGIC MOUTHWASH W/LIDOCAINE
15.0000 mL | Freq: Four times a day (QID) | ORAL | Status: DC
  Administered 2015-09-28 – 2015-10-01 (×11): 15 mL via ORAL
  Administered 2015-10-01: 5 mL via ORAL
  Administered 2015-10-01 – 2015-10-04 (×11): 15 mL via ORAL
  Filled 2015-09-28 (×29): qty 15

## 2015-09-28 MED ORDER — KCL IN DEXTROSE-NACL 20-5-0.9 MEQ/L-%-% IV SOLN
INTRAVENOUS | Status: AC
  Administered 2015-09-28 – 2015-09-30 (×4): via INTRAVENOUS
  Filled 2015-09-28 (×4): qty 1000

## 2015-09-28 MED ORDER — SODIUM CHLORIDE 0.9 % IV BOLUS (SEPSIS)
250.0000 mL | Freq: Once | INTRAVENOUS | Status: AC
  Administered 2015-09-28: 250 mL via INTRAVENOUS

## 2015-09-28 MED ORDER — FAMOTIDINE IN NACL 20-0.9 MG/50ML-% IV SOLN
20.0000 mg | Freq: Two times a day (BID) | INTRAVENOUS | Status: DC
  Administered 2015-09-28 – 2015-10-02 (×8): 20 mg via INTRAVENOUS
  Filled 2015-09-28 (×8): qty 50

## 2015-09-28 MED ORDER — LACTULOSE 10 GM/15ML PO SOLN
20.0000 g | Freq: Two times a day (BID) | ORAL | Status: DC
  Filled 2015-09-28: qty 30

## 2015-09-28 MED ORDER — POTASSIUM CHLORIDE 10 MEQ/100ML IV SOLN
INTRAVENOUS | Status: AC
  Administered 2015-09-28: 10 meq
  Filled 2015-09-28: qty 100

## 2015-09-28 MED ORDER — ASPIRIN 81 MG PO CHEW
81.0000 mg | CHEWABLE_TABLET | Freq: Every day | ORAL | Status: DC
Start: 2015-09-28 — End: 2015-10-04
  Administered 2015-09-28 – 2015-10-04 (×7): 81 mg via ORAL
  Filled 2015-09-28 (×7): qty 1

## 2015-09-28 MED ORDER — POTASSIUM CHLORIDE CRYS ER 20 MEQ PO TBCR
40.0000 meq | EXTENDED_RELEASE_TABLET | Freq: Three times a day (TID) | ORAL | Status: DC
  Filled 2015-09-28 (×2): qty 2

## 2015-09-28 MED ORDER — POTASSIUM CHLORIDE 20 MEQ/15ML (10%) PO SOLN
40.0000 meq | ORAL | Status: DC
Start: 2015-09-28 — End: 2015-09-28
  Administered 2015-09-28: 40 meq via ORAL
  Filled 2015-09-28: qty 30

## 2015-09-28 NOTE — Progress Notes (Signed)
I was notified by central telemetry monitoring that pt had episode of SVT and A.fib. Pt asymptomatic, NP on call notified. Will continue to monitor.  Janell Quiet, RN

## 2015-09-28 NOTE — Progress Notes (Signed)
Pharmacy Antibiotic Follow-up Note  Maria Pruitt is a 70 y.o. year-old female admitted on 09/24/2015.  The patient is currently on day 4 of Vancomycin and Zosyn for sepsis 2/2 pneumonia, febrile neutropenia.  Assessment/Plan: After discussion with Dr Sanjuana Letters, vancomycin will be discontinued since patient is afebrile and cultures negative x 48 hours. Continue Zosyn 3.375g IV q8h (4 hour infusion time) at this time.  Temp (24hrs), Avg:98.3 F (36.8 C), Min:98 F (36.7 C), Max:98.4 F (36.9 C)   Recent Labs Lab 09/24/15 1530 09/25/15 0245 09/26/15 0320 09/27/15 0330 09/28/15 0429  WBC 1.2* 1.0* 2.1* 1.7* 2.3*    Recent Labs Lab 09/25/15 0245 09/26/15 0320 09/26/15 1050 09/27/15 0330 09/28/15 0429  CREATININE 1.35* 1.06* 1.06* 1.34* 0.94   Estimated Creatinine Clearance: 60.3 mL/min (by C-G formula based on Cr of 0.94).    Allergies  Allergen Reactions  . Ciprofloxacin Other (See Comments)  . Clonidine Derivatives Other (See Comments)  . Phenytoin     Other reaction(s): Other Elevated LFTs  . Sulfa Antibiotics Hives  . Benzonatate     REACTION: Swelling, tessalon perles; tolerates benadryl  . Codeine     REACTION: Nausea    Antimicrobials this admission: 1/14 >> cefepime x1 1/14 >> vancomycin>> 1/18 1/14 >> Zosyn >>  1/16 >> tamiflu >>  Levels/dose changes this admission: 1/16 Vanco increased from 1g q24h to 750mg  q12h based on nomogram with SCr now improved.  Microbiology results: 1/14 blood x 2 , incl PAC: ngtd x 3 days 1/15 urine: NGF 1/15 sputum: rejected, need to recollect 1/17 sputum: IP 1/16 nasal cx: normal flora 1/16 flu panel: POSITIVE 1/17 MRSA PCR: NEG 1/17 Respiratory virus panel: influenza A +  Thank you for allowing pharmacy to be a part of this patient's care.  Hershal Coria PharmD 09/28/2015 10:34 AM

## 2015-09-28 NOTE — Progress Notes (Signed)
[facility] PROGRESS NOTE  Maria Pruitt [id number] DOB: 06/23/1946 DOA: 09/24/2015 PCP: [name], PA-C  Summary 09/28/15: I have seen and examined Maria Pruitt at bedside in the presence of her son and a family friend and reviewed her chart. Appreciate oncology. Maria Pruitt is a pleasant 70 year old female with invasive lobular breast carcinoma metastatic to left adrenal gland, regional nodes and bones,(followed by Dr. Jana Hakim, and she was started treatment 8 weeks prior to this admission), atrial fibrillation not anticoagulated because of intracranial bleed, and she presented to [facility] long emergency department with several days duration of progressively worsening weakness, nausea and nonbloody vomiting, poor oral intake and fatigue apparently resulting from Influenza Type A with concern for sepsis/pneumonia. Incidentally, she had CT chest that showed left lower lobe pulmonary embolism whose chronicity is not clear at this point. She also has electrolyte imbalances likely related to poor oral intake(hypomagnesemia/hypokalemia). Septic workup remains negative except for influenza. She has transaminitis/hyperammonemia, and it is not clear why she has transaminitis-?sepsis, other causes. She seems to be making progress including fever subsiding but she has mucositis likely related to chemotherapy. She would rather we wait on IVC filter placement while she decides whether to proceed with it, understanding the risk of throwing new clots. Will discontinue vancomycin as septic workup unremarkable so far, continue Zosyn/Tamiflu to complete course of antibiotics, change fluids to D5W normal saline with KCl, and continue to replenish electrolytes as necessary. Meanwhile, will give her Magic mouthwash instead of nystatin for mucositis. Will monitor liver enzymes and consider abdominal imaging if not improving in the next 24-48 hours. We'll follow oncology recommendations. Plan Sepsis  (HCC)/Pneumonia of right upper lobe due to infectious organism/Pyrexia/Transaminitis/Neutropenic fever (HCC)/Influenza A  Discontinue vancomycin  Follow septic workup  Date 3 Zosyn/Tamiflu  Image abdomen if transaminitis worsening  Lactulose(had 2 bowel movements today) PE (pulmonary embolism)  Atrial fibrillation with RVR, CHADS 2-3  (HCC)/Breast cancer of upper-outer quadrant of left female breast (HCC)/Breast cancer metastasized to bone (HCC)/Thrombocytopenia (HCC)/Anemia of chronic disease  Appreciate oncology. Will follow oncology recommendations  Ideally needs IVC filter, will follow with patient and family regarding whether to have IVC filter placed before discharge or defer indefinitely, understanding the risk  Continue rate control medications for atrial fibrillation(Lopressor) Disorder of electrolytes, low Mg, K, Ca, Phosp/Acute kidney injury (HCC)/Mucositis oral  Replenish KCl/magnesium  Magic mouthwash with lidocaine  Change IV fluids to D5 normal saline with KCl  Code Status: Full Code Family Communication: Son and family friend at bedside Disposition Plan: Home eventually   Consultants:  Oncology  Procedures:  None  Antibiotics:  Vancomycin 09/26/2015>09/28/2015  Zosyn 09/26/2015>  Tamiflu 09/26/2015>  HPI/Subjective: Feels better, still has soreness in the mouth.  Objective: Filed Vitals:   09/27/15 2150 09/28/15 0510  BP: 116/52 132/62  Pulse: 90 85  Temp: 98 F (36.7 C) 98.3 F (36.8 C)  Resp: 18 18    Intake/Output Summary (Last 24 hours) at 09/28/15 1222 Last data filed at 09/28/15 1205  Gross per 24 hour  Intake 2254.17 ml  Output   1751 ml  Net 503.17 ml   Filed Weights   09/26/15 0222 09/27/15 0609 09/28/15 0510  Weight: 79.606 kg (175 lb 8 oz) 80.2 kg (176 lb 12.9 oz) 82.5 kg (181 lb 14.1 oz)    Exam:   General:  Comfortable at rest.  Cardiovascular: S1-S2 normal. No murmurs. Pulse regular.  Respiratory: Good air  entry bilaterally. No rhonchi or rales.  Abdomen: Soft and nontender.  Normal bowel sounds. No organomegaly.  Musculoskeletal: No pedal edema   Neurological: Intact  Data Reviewed: Basic Metabolic Panel:  Recent Labs Lab 09/24/15 2125 09/25/15 0245 09/25/15 1125 09/26/15 0320 09/26/15 1050 09/27/15 0330 09/28/15 0429  NA  --  135  --  131* 127* 132* 136  K  --  2.7*  --  3.4* 3.2* 3.5 3.2*  CL  --  100*  --  95* 93* 100* 104  CO2  --  23  --  24 23 22  21*  GLUCOSE  --  205*  --  124* 119* 101* 223*  BUN  --  11  --  7 7 8 7   CREATININE  --  1.35*  --  1.06* 1.06* 1.34* 0.94  CALCIUM  --  7.4*  --  7.2* 6.9* 6.5* 6.5*  MG 0.9*  --  1.1* 0.9* 1.3* 1.1* 1.3*  PHOS 1.1*  --  1.4* 1.8*  --  2.2*  --    Liver Function Tests:  Recent Labs Lab 09/24/15 1530 09/25/15 0950 09/26/15 0320 09/28/15 0429  AST 127* 98* 103* 122*  ALT 123* 94* 88* 72*  ALKPHOS 198* 152* 160* 184*  BILITOT 4.2* 4.2* 4.8* 4.5*  PROT 6.1* 5.2* 5.5* 5.3*  ALBUMIN 3.1* 2.5* 2.6* 2.4*   No results for input(s): LIPASE, AMYLASE in the last 168 hours.  Recent Labs Lab 09/24/15 1530 09/28/15 0554  AMMONIA 43* 49*   CBC:  Recent Labs Lab 09/24/15 1530 09/25/15 0245 09/26/15 0320 09/27/15 0330 09/28/15 0429  WBC 1.2* 1.0* 2.1* 1.7* 2.3*  NEUTROABS 0.7*  --   --   --   --   HGB 11.1* 9.8* 10.5* 9.3* 10.1*  HCT 30.7* 27.1* 28.9* 25.9* 28.0*  MCV 98.7 100.4* 101.0* 102.0* 101.8*  PLT 182 156 148* 118* 126*   Cardiac Enzymes:  Recent Labs Lab 09/25/15 1125  TROPONINI 0.08*   BNP (last 3 results) No results for input(s): BNP in the last 8760 hours.  ProBNP (last 3 results) No results for input(s): PROBNP in the last 8760 hours.  CBG: No results for input(s): GLUCAP in the last 168 hours.  Recent Results (from the past 240 hour(s))  Culture, blood (Routine X 2) w Reflex to ID Panel     Status: None (Preliminary result)   Collection Time: 09/24/15  3:25 PM  Result Value Ref Range  Status   Specimen Description   Final    BLOOD PORTA CATH BOTTLES DRAWN AEROBIC AND ANAEROBIC   Special Requests 10CC  Final   Culture   Final    NO GROWTH 4 DAYS Performed at [facility]    Report Status PENDING  Incomplete  Culture, blood (Routine X 2) w Reflex to ID Panel     Status: None (Preliminary result)   Collection Time: 09/24/15  4:00 PM  Result Value Ref Range Status   Specimen Description BLOOD BLOOD RIGHT HAND  Final   Special Requests BOTTLES DRAWN AEROBIC AND ANAEROBIC 5CC  Final   Culture   Final    NO GROWTH 4 DAYS Performed at [facility]    Report Status PENDING  Incomplete  Urine culture     Status: None   Collection Time: 09/25/15 12:13 AM  Result Value Ref Range Status   Specimen Description URINE, CLEAN CATCH  Final   Special Requests NONE  Final   Culture   Final    NO GROWTH 1 DAY Performed at [facility]  Report Status 09/26/2015 FINAL  Final  Culture, sputum-assessment     Status: None   Collection Time: 09/25/15 11:10 PM  Result Value Ref Range Status   Specimen Description SPU  Final   Special Requests NONE  Final   Sputum evaluation   Final    MICROSCOPIC FINDINGS SUGGEST THAT THIS SPECIMEN IS NOT REPRESENTATIVE OF LOWER RESPIRATORY SECRETIONS. PLEASE RECOLLECT. HOLT,B RN [id number] COVINGTON,N    Report Status 09/26/2015 FINAL  Final  Respiratory virus panel     Status: Abnormal   Collection Time: 09/26/15 12:16 PM  Result Value Ref Range Status   Respiratory Syncytial Virus A Negative Negative Final   Respiratory Syncytial Virus B Negative Negative Final   Influenza A Positive (A) Negative Final    Comment: Subtype: H3   Influenza B Negative Negative Final   Parainfluenza 1 Negative Negative Final   Parainfluenza 2 Negative Negative Final   Parainfluenza 3 Negative Negative Final   Metapneumovirus Negative Negative Final   Rhinovirus Negative Negative Final   Adenovirus Negative Negative Final    Comment:  (NOTE) Performed At: [facility] [place], [place] [id number] Lindon Romp MD [id number]   Nasal culture     Status: None (Preliminary result)   Collection Time: 09/26/15  4:24 PM  Result Value Ref Range Status   Specimen Description NOSE  Final   Special Requests NONE  Final   Culture   Final    NORMAL NASOPHARYNGEAL FLORA Performed at [facility]    Report Status PENDING  Incomplete  MRSA PCR Screening     Status: None   Collection Time: 09/27/15 11:36 AM  Result Value Ref Range Status   MRSA by PCR NEGATIVE NEGATIVE Final    Comment:        The GeneXpert MRSA Assay (FDA approved for NASAL specimens only), is one component of a comprehensive MRSA colonization surveillance program. It is not intended to diagnose MRSA infection nor to guide or monitor treatment for MRSA infections.   Culture, expectorated sputum-assessment     Status: None   Collection Time: 09/27/15  4:14 PM  Result Value Ref Range Status   Specimen Description SPU  Final   Special Requests Immunocompromised  Final   Sputum evaluation   Final    MICROSCOPIC FINDINGS SUGGEST THAT THIS SPECIMEN IS NOT REPRESENTATIVE OF LOWER RESPIRATORY SECRETIONS. PLEASE RECOLLECT. [place] RN [id number] COVINGTON,N    Report Status 09/27/2015 FINAL  Final     Studies: Dg Chest 2 View  09/27/2015  CLINICAL DATA:  Fever, shortness of breath and productive cough. Recently diagnosed pulmonary embolism. EXAM: CHEST  2 VIEW COMPARISON:  Previous examinations. FINDINGS: Stable enlarged cardiac silhouette. Stable right jugular porta catheter. Left breast implant with capsular calcifications. Left breast and left axillary surgical clips. Clear lungs. Diffuse osteopenia. IMPRESSION: No acute abnormality. Electronically Signed   By: Claudie Revering M.D.   On: 09/27/2015 15:31    Scheduled Meds: . enoxaparin (LOVENOX) injection  40 mg Subcutaneous Q24H  . famotidine (PEPCID) IV  20 mg  Intravenous Q12H  . feeding supplement  1 Container Oral TID BM  . guaiFENesin  600 mg Oral BID  . hydrocortisone sod succinate (SOLU-CORTEF) inj  100 mg Intravenous Q8H  . ipratropium  0.5 mg Nebulization TID  . [START ON 09/29/2015] lactulose  20 g Oral Daily  . levalbuterol  1.25 mg Nebulization TID  . levothyroxine  88 mcg Oral QAC breakfast  . magic  mouthwash w/lidocaine  15 mL Oral QID  . magnesium sulfate 1 - 4 g bolus IVPB  2 g Intravenous Once  . metoprolol  12.5 mg Oral BID  . oseltamivir  30 mg Oral BID  . piperacillin-tazobactam (ZOSYN)  IV  3.375 g Intravenous Q8H  . potassium & sodium phosphates  1 packet Oral TID WC & HS  . potassium chloride  40 mEq Oral TID  . sodium chloride  10-40 mL Intracatheter Q12H  . sodium chloride  3 mL Intravenous Q12H   Continuous Infusions: . dextrose 5 % and 0.9 % NaCl with KCl 20 mEq/L       Time spent: 25 minutes    Jadee Golebiewski  [facility] Pager [phone]. If 7PM-7AM, please contact night-coverage at [email or web address], password [facility] 09/28/2015, 12:22 PM  LOS: 4 days

## 2015-09-28 NOTE — Evaluation (Signed)
SLP Cancellation Note  Patient Details Name: Maria Pruitt MRN: [id number] DOB: 05-29-1946   Cancelled treatment:       Reason Eval/Treat Not Completed: Patient at procedure or test/unavailable (pt being bathed by family member, will continue efforts)   Luanna Salk, [facility] SLP [phone]

## 2015-09-28 NOTE — Progress Notes (Addendum)
Paged MD to make aware of pt's increased HR 140-150s and converting to Afib    New orders initiated for increased HR, 172 at this time.

## 2015-09-28 NOTE — Care Management Note (Signed)
Case Management Note  Patient Details  Name: Maria Pruitt MRN: [id number] Date of Birth: 10/26/1945  Subjective/Objective: PNA, abnormal lytes.Noted hematology-family decline IVC filter.[id number] Ca,afib w/rvr. Iv abx,ivf,iv pepcid,lactulose,iv solucortef.From home.                  Action/Plan:d/c plan home.   Expected Discharge Date:                Expected Discharge Plan:  Home/Self Care  In-House Referral:     Discharge planning Services  CM Consult  Post Acute Care Choice:    Choice offered to:     DME Arranged:    DME Agency:     HH Arranged:    HH Agency:     Status of Service:  In process, will continue to follow  Medicare Important Message Given:  Yes Date Medicare IM Given:    Medicare IM give by:    Date Additional Medicare IM Given:    Additional Medicare Important Message give by:     If discussed at [place] of Stay Meetings, dates discussed:    Additional Comments:  Dessa Phi, RN 09/28/2015, 2:12 PM  Dessa Phi RN, BSN CM  [phone]

## 2015-09-28 NOTE — Progress Notes (Signed)
VASCULAR LAB PRELIMINARY  PRELIMINARY  PRELIMINARY  PRELIMINARY  Right upper extremity venous duplex completed.    Preliminary report:  Right :  No evidence of DVT or superficial thrombosis.    Ellarose Brandi, RVS 09/28/2015, 9:10 AM

## 2015-09-28 NOTE — Progress Notes (Addendum)
Paged MD to make aware of pt status one hour post Lanoxin and ASA 81mg , HR mid 150-170s  MD on unit, BP obtained and reported, 98/78, new orders given

## 2015-09-28 NOTE — Progress Notes (Signed)
Long talk w Ms Gismondi-- unfortunately her sister couldn't stay. We discussed whether or not to continue chemo, whether to stick with this chemo, and what to do about her bleeding and clotting risks,  She is very motivated to continue with eribulin and feels not unreasonably that at least part of the problem that landed her in the hospital is the intercurrent flu. We agreed she would retrn to chemo once she is fully ambulatory, her counts recovered and her electrolytes corrected. When we do next treat, likely JAN 31 or so, we will also drop the chemo dose, at least initially. She is very pleased with these decisions.  The issue of clotting and bleeding risks is more complex. i would not want to fully anticoagulate her given the prior bleeding history. I thin prophylactic dose lovenox is a good compromise, at least temporarily. It will increase her risk of bleeding a little but decrease the risk of further clots. Her sister is a Marine scientist and this could be continued as outpt w/o difficulty  As far as the IVC filter is concerned, certainly it would further decrease the risk of PEs. She understnads today's filters are reversible. Nevertheless it is an invasive procedure and can have its own set of complications.  We decided for now we would stick with the low-dose lovenox and not proceed with the filter. We will return to that question once she has recovered, returned home, and is getting ready for more treatment.   If I knew she had a long time to live (2-3 years) I would urge her to get the filter in place. If I knew it would be a few months I would not do it. She understands this uncertainty and is praying about it.  Accordingly next steps are correcting her metabolic abnormalities and improving her functional status as you are planning. I expect she may be ready to get home within a few days. I will follow w you during the hospitalization and arrange outpatient follow-up.  Greatly appreciate your help  to this patient!

## 2015-09-28 NOTE — Progress Notes (Signed)
Received email from billing following up on [employer] copay assistance documents. I had not received anything. Placed a call to [employer] to obtain approval letter and POE and spoke with Raquel. She confirmed patient was approved and they did receive the physician form back. She states application was done by Bosie Clos. in the pharmacy. She will put in a request to have documents faxed to me so that I can send to billing to submit for payment for dates of service.

## 2015-09-29 ENCOUNTER — Encounter: Payer: Self-pay | Admitting: Nurse Practitioner

## 2015-09-29 LAB — CBC
HEMATOCRIT: 28.7 % — AB (ref 36.0–46.0)
HEMOGLOBIN: 10.2 g/dL — AB (ref 12.0–15.0)
MCH: 36.2 pg — AB (ref 26.0–34.0)
MCHC: 35.5 g/dL (ref 30.0–36.0)
MCV: 101.8 fL — ABNORMAL HIGH (ref 78.0–100.0)
Platelets: 139 10*3/uL — ABNORMAL LOW (ref 150–400)
RBC: 2.82 MIL/uL — ABNORMAL LOW (ref 3.87–5.11)
RDW: 14.5 % (ref 11.5–15.5)
WBC: 1.9 10*3/uL — ABNORMAL LOW (ref 4.0–10.5)

## 2015-09-29 LAB — BASIC METABOLIC PANEL
Anion gap: 14 (ref 5–15)
BUN: 5 mg/dL — AB (ref 6–20)
CO2: 19 mmol/L — AB (ref 22–32)
CREATININE: 0.89 mg/dL (ref 0.44–1.00)
Calcium: 6.3 mg/dL — CL (ref 8.9–10.3)
Chloride: 106 mmol/L (ref 101–111)
GFR calc Af Amer: >60 mL/min (ref >60)
GFR calc non Af Amer: >60 mL/min (ref >60)
Glucose, Bld: 217 mg/dL — ABNORMAL HIGH (ref 65–99)
Potassium: 3 mmol/L — ABNORMAL LOW (ref 3.5–5.1)
Sodium: 139 mmol/L (ref 135–145)

## 2015-09-29 LAB — NASAL CULTURE (N/P): Culture: NORMAL

## 2015-09-29 LAB — CULTURE, BLOOD (ROUTINE X 2)
CULTURE: NO GROWTH
CULTURE: NO GROWTH

## 2015-09-29 LAB — HEPATIC FUNCTION PANEL
ALT: 106 U/L — ABNORMAL HIGH (ref 14–54)
AST: 153 U/L — ABNORMAL HIGH (ref 15–41)
Albumin: 2.5 g/dL — ABNORMAL LOW (ref 3.5–5.0)
Alkaline Phosphatase: 266 U/L — ABNORMAL HIGH (ref 38–126)
BILIRUBIN INDIRECT: 1.5 mg/dL — AB (ref 0.3–0.9)
Bilirubin, Direct: 2.6 mg/dL — ABNORMAL HIGH (ref 0.1–0.5)
TOTAL PROTEIN: 5.6 g/dL — AB (ref 6.5–8.1)
Total Bilirubin: 4.1 mg/dL — ABNORMAL HIGH (ref 0.3–1.2)

## 2015-09-29 LAB — AMMONIA: AMMONIA: 68 umol/L — AB (ref 9–35)

## 2015-09-29 LAB — MAGNESIUM: MAGNESIUM: 1.6 mg/dL — AB (ref 1.7–2.4)

## 2015-09-29 MED ORDER — OSELTAMIVIR PHOSPHATE 75 MG PO CAPS
75.0000 mg | ORAL_CAPSULE | Freq: Two times a day (BID) | ORAL | Status: AC
  Administered 2015-09-29 – 2015-10-01 (×5): 75 mg via ORAL
  Filled 2015-09-29 (×5): qty 1

## 2015-09-29 MED ORDER — CALCIUM CARBONATE ANTACID 500 MG PO CHEW
1.0000 | CHEWABLE_TABLET | Freq: Three times a day (TID) | ORAL | Status: DC
  Administered 2015-09-29 – 2015-10-04 (×15): 200 mg via ORAL
  Filled 2015-09-29 (×18): qty 1

## 2015-09-29 MED ORDER — SODIUM CHLORIDE 0.9 % IV SOLN
1.0000 g | Freq: Once | INTRAVENOUS | Status: AC
  Administered 2015-09-29: 1 g via INTRAVENOUS
  Filled 2015-09-29: qty 10

## 2015-09-29 MED ORDER — ZOLPIDEM TARTRATE 5 MG PO TABS: 5.0000 mg | ORAL_TABLET | Freq: Every evening | ORAL | Status: DC | PRN

## 2015-09-29 MED ORDER — POTASSIUM CHLORIDE 10 MEQ/100ML IV SOLN
10.0000 meq | INTRAVENOUS | Status: AC
Start: 2015-09-29 — End: 2015-09-29
  Administered 2015-09-29 (×4): 10 meq via INTRAVENOUS
  Filled 2015-09-29 (×3): qty 100

## 2015-09-29 MED ORDER — MAGNESIUM SULFATE 2 GM/50ML IV SOLN
2.0000 g | Freq: Once | INTRAVENOUS | Status: AC
  Administered 2015-09-29: 2 g via INTRAVENOUS
  Filled 2015-09-29: qty 50

## 2015-09-29 NOTE — Progress Notes (Signed)
Received documents from [employer]. Tour manager and POE to [employer] in billing.

## 2015-09-29 NOTE — Progress Notes (Signed)
Nutrition Follow-up  DOCUMENTATION CODES:   Not applicable  INTERVENTION:  - Continue Boost Breeze TID - RD will continue to monitor for needs  NUTRITION DIAGNOSIS:   Increased nutrient needs related to catabolic illness, cancer and cancer related treatments as evidenced by estimated needs. -ongoing  GOAL:   Patient will meet greater than or equal to 90% of their needs -unmet  MONITOR:   PO intake, Supplement acceptance, Weight trends, Labs, I & O's  ASSESSMENT:   70 year old female with invasive lobular breast carcinoma metastatic to left adrenal gland, regional nodes and bones, follows with Dr. Jana Hakim, patient started treatment 8 weeks prior to this admission currently on daily oral chemotherapy, started IV chemotherapy 2 weeks prior to this admission and gets these treatments every 2 weeks. She now presents to [facility] long emergency department with several days duration of progressively worsening weakness, nausea and nonbloody vomiting, poor oral intake and fatigue. Patient also reports subjective fevers and chills, cough mixed nonproductive and productive clear sputum. Patient also reports intermittent chest discomfort that occurs with coughing spells. Patient denies any specific sick contacts or exposures. She reports abdominal discomfort that occurs with vomiting episodes. Last chemotherapy on Tuesday, 4 days prior to this admission.  1/19 No intakes documented since last assessment. Pt reports that she has been having burning sensation with certain food and drink items (specifically those that are acidic). She has been able to tolerate juice, soda, ice cream, yogurt well and feels she may be able to tolerate macaroni and cheese, chicken noodle soup, and mashed potatoes. Notes in chart indicate pt with oral mucusitis. She states that she does not tolerate hot or cold items better than the other.  Pt states she has been feeling very hungry but that mucusitis is limiting intakes. Pt  denies questions or concerns at this time. Physical assessment indicates no muscle or fat wasting. Not meeting needs. Medications reviewed. Labs reviewed; K: 3 mmol/L, BUN: 5 mg/dL, Ca: 6.3 mg/dL, Mg: 1.6 mg/dL.    1/16 - No intakes documented but noted 50% completion of cup of jello on bedside table which pt reports she consumed for breakfast without N/V.  - She was experiencing N/V for 2 days PTA and last episode of emesis was last night (1/15). - Pt states that she has had poor appetite while undergoing chemo and has been experiencing taste alteration (bland taste).  - Pt became drowsy during the end of discussion.  - Pt had a stroke ~1 year ago with no residual deficits in chewing or swallowing.  - Pt was not drinking nutrition supplements PTA.  - She states that she has been losing weight; unable to obtain further information on this from pt .  - Per chart review, pt has lost 2 lbs (1% body weight) over 12 days which is not significant for time frame.  - Also lost 5 lbs (3% body weight) over the past 3 months which is also not significant for time frame.  - Did not perform physical assessment at this time with respect to pt's comfort but will do so at follow-up and document findings at that time.  Diet Order:  Diet regular Room service appropriate?: Yes; Fluid consistency:: Thin  Skin:  Reviewed, no issues  Last BM:  1/18  Height:   Ht Readings from Last 1 Encounters:  09/24/15 5\' 4"  (1.626 m)    Weight:   Wt Readings from Last 1 Encounters:  09/29/15 181 lb 7 oz (82.3 kg)    Ideal  Body Weight:  54.54 kg (kg)  BMI:  Body mass index is 31.13 kg/(m^2).  Estimated Nutritional Needs:   Kcal:  [id number]  Protein:  85-95 grams  Fluid:  >/= 2 L/day  EDUCATION NEEDS:   No education needs identified at this time     Jarome Matin, RD, LDN Inpatient Clinical Dietitian Pager # [phone] After hours/weekend pager # [phone]

## 2015-09-29 NOTE — Progress Notes (Signed)
Pruitt Maria [id number] DOB: [birth year] DOA: 09/24/2015 PCP: [name], PA-C  Summary&Daily Progress Notes 09/28/15: I have seen and examined [name] at bedside in the presence of her son and a family friend and reviewed her chart. Appreciate oncology. Maria Pruitt is a pleasant 70 year old female with invasive lobular breast carcinoma metastatic to left adrenal gland, regional nodes and bones,(followed by Dr. Jana Hakim, and she was started treatment 8 weeks prior to this admission), atrial fibrillation not anticoagulated because of intracranial bleed, and she presented to [facility] long emergency department with several days duration of progressively worsening weakness, nausea and nonbloody vomiting, poor oral intake and fatigue apparently resulting from Influenza Type A with concern for sepsis/pneumonia. Incidentally, she had CT chest that showed left lower lobe pulmonary embolism whose chronicity is not clear at this point. She also has electrolyte imbalances likely related to poor oral intake(hypomagnesemia/hypokalemia). Septic workup remains negative except for influenza. She has transaminitis/hyperammonemia, and it is not clear why she has transaminitis-?sepsis, other causes. She seems to be making progress including fever subsiding but she has mucositis likely related to chemotherapy. She would rather we wait on IVC filter placement while she decides whether to proceed with it, understanding the risk of throwing new clots. Will discontinue vancomycin as septic workup unremarkable so far, continue Zosyn/Tamiflu to complete course of antibiotics, change fluids to D5W normal saline with KCl, and continue to replenish electrolytes as necessary. Meanwhile, will give her Magic mouthwash instead of nystatin for mucositis. Will monitor liver enzymes and consider abdominal imaging if not improving in the next 24-48 hours. We'll follow oncology recommendations. 09/29/15: Appreciate oncology.  Noted discussions regarding IVC filter placement/anticoagulation. Electrolytes remain off. Septic workup remains negative. It is unlikely that there was a bacterial element to patient's acute presentation. We'll therefore discontinue Zosyn, continue Tamiflu to complete 5 day course, replenish electrolytes, and defer anticoagulation decision to oncology. Patient continues to refuse lactulose even though ammonia level higher. Liver enzymes also higher. Problem List Plan  Principal Problem:   Sepsis ([place]) Active Problems:   Pneumonia of right upper lobe due to infectious organism   Pyrexia   PE (pulmonary embolism)   Transaminitis   Neutropenic fever (HCC)   Influenza A   Disorder of electrolytes, low Mg, K, Ca, Phosp   Atrial fibrillation with RVR, CHADS 2-3  (HCC)   Breast cancer of upper-outer quadrant of left female breast (HCC)   Breast cancer metastasized to bone (HCC)   Thrombocytopenia (HCC)   Anemia of chronic disease   Acute kidney injury ([place])   Mucositis oral   Replenish potassium/magnesium/calcium   Discontinue Zosyn  Tamiflu day 3   Add tums  Follow oncology recommendations   Code Status: Full Code Family Communication: Son and family friend at bedside Disposition Plan: Home eventually   Consultants:  Oncology  Procedures:  None  Antibiotics:  Vancomycin 09/26/2015>09/28/2015  Zosyn 09/26/2015>09/29/15  Tamiflu 09/26/2015>  HPI/Subjective: Feels better. Had loose bowel movements earlier today.  Objective: Filed Vitals:   09/29/15 0554 09/29/15 1316  BP: 121/65 119/53  Pulse: 76 81  Temp: 99.4 F (37.4 C) 99.4 F (37.4 C)  Resp: 18 16    Intake/Output Summary (Last 24 hours) at 09/29/15 1948 Last data filed at 09/29/15 1900  Gross per 24 hour  Intake   1970 ml  Output    400 ml  Net   1570 ml   Filed Weights   09/27/15 0609 09/28/15 0510 09/29/15 0554  Weight: 80.2 kg (176  lb 12.9 oz) 82.5 kg (181 lb 14.1 oz) 82.3 kg (181 lb 7 oz)     Exam:   General:  Comfortable at rest.  Cardiovascular: S1-S2 normal. No murmurs. Pulse regular.  Respiratory: Good air entry bilaterally. No rhonchi or rales.  Abdomen: Soft and nontender. Normal bowel sounds. No organomegaly.  Musculoskeletal: No pedal edema   Neurological: Intact  Data Reviewed: Basic Metabolic Panel:  Recent Labs Lab 09/24/15 2125  09/25/15 1125 09/26/15 0320 09/26/15 1050 09/27/15 0330 09/28/15 0429 09/29/15 0355  NA  --   < >  --  131* 127* 132* 136 139  K  --   < >  --  3.4* 3.2* 3.5 3.2* 3.0*  CL  --   < >  --  95* 93* 100* 104 106  CO2  --   < >  --  24 23 22  21* 19*  GLUCOSE  --   < >  --  124* 119* 101* 223* 217*  BUN  --   < >  --  7 7 8 7  5*  CREATININE  --   < >  --  1.06* 1.06* 1.34* 0.94 0.89  CALCIUM  --   < >  --  7.2* 6.9* 6.5* 6.5* 6.3*  MG 0.9*  --  1.1* 0.9* 1.3* 1.1* 1.3* 1.6*  PHOS 1.1*  --  1.4* 1.8*  --  2.2*  --   --   < > = values in this interval not displayed. Liver Function Tests:  Recent Labs Lab 09/24/15 1530 09/25/15 0950 09/26/15 0320 09/28/15 0429 09/29/15 0355  AST 127* 98* 103* 122* 153*  ALT 123* 94* 88* 72* 106*  ALKPHOS 198* 152* 160* 184* 266*  BILITOT 4.2* 4.2* 4.8* 4.5* 4.1*  PROT 6.1* 5.2* 5.5* 5.3* 5.6*  ALBUMIN 3.1* 2.5* 2.6* 2.4* 2.5*   No results for input(s): LIPASE, AMYLASE in the last 168 hours.  Recent Labs Lab 09/24/15 1530 09/28/15 0554 09/29/15 0650  AMMONIA 43* 49* 68*   CBC:  Recent Labs Lab 09/24/15 1530 09/25/15 0245 09/26/15 0320 09/27/15 0330 09/28/15 0429 09/29/15 0355  WBC 1.2* 1.0* 2.1* 1.7* 2.3* 1.9*  NEUTROABS 0.7*  --   --   --   --   --   HGB 11.1* 9.8* 10.5* 9.3* 10.1* 10.2*  HCT 30.7* 27.1* 28.9* 25.9* 28.0* 28.7*  MCV 98.7 100.4* 101.0* 102.0* 101.8* 101.8*  PLT 182 156 148* 118* 126* 139*   Cardiac Enzymes:  Recent Labs Lab 09/25/15 1125  TROPONINI 0.08*   BNP (last 3 results) No results for input(s): BNP in the last 8760  hours.  ProBNP (last 3 results) No results for input(s): PROBNP in the last 8760 hours.  CBG: No results for input(s): GLUCAP in the last 168 hours.  Recent Results (from the past 240 hour(s))  Culture, blood (Routine X 2) w Reflex to ID Panel     Status: None   Collection Time: 09/24/15  3:25 PM  Result Value Ref Range Status   Specimen Description   Final    BLOOD PORTA CATH BOTTLES DRAWN AEROBIC AND ANAEROBIC   Special Requests 10CC  Final   Culture   Final    NO GROWTH 5 DAYS Performed at [facility]    Report Status 09/29/2015 FINAL  Final  Culture, blood (Routine X 2) w Reflex to ID Panel     Status: None   Collection Time: 09/24/15  4:00 PM  Result Value Ref Range Status  Specimen Description BLOOD BLOOD RIGHT HAND  Final   Special Requests BOTTLES DRAWN AEROBIC AND ANAEROBIC 5CC  Final   Culture   Final    NO GROWTH 5 DAYS Performed at [facility]    Report Status 09/29/2015 FINAL  Final  Urine culture     Status: None   Collection Time: 09/25/15 12:13 AM  Result Value Ref Range Status   Specimen Description URINE, CLEAN CATCH  Final   Special Requests NONE  Final   Culture   Final    NO GROWTH 1 DAY Performed at [facility]    Report Status 09/26/2015 FINAL  Final  Culture, sputum-assessment     Status: None   Collection Time: 09/25/15 11:10 PM  Result Value Ref Range Status   Specimen Description SPU  Final   Special Requests NONE  Final   Sputum evaluation   Final    MICROSCOPIC FINDINGS SUGGEST THAT THIS SPECIMEN IS NOT REPRESENTATIVE OF LOWER RESPIRATORY SECRETIONS. PLEASE RECOLLECT. HOLT,B RN [id number] [place],N    Report Status 09/26/2015 FINAL  Final  Respiratory virus panel     Status: Abnormal   Collection Time: 09/26/15 12:16 PM  Result Value Ref Range Status   Respiratory Syncytial Virus A Negative Negative Final   Respiratory Syncytial Virus B Negative Negative Final   Influenza A Positive (A) Negative Final     Comment: Subtype: H3   Influenza B Negative Negative Final   Parainfluenza 1 Negative Negative Final   Parainfluenza 2 Negative Negative Final   Parainfluenza 3 Negative Negative Final   Metapneumovirus Negative Negative Final   Rhinovirus Negative Negative Final   Adenovirus Negative Negative Final    Comment: (NOTE) Performed At: [facility] [address] [place], [place] [id number] Lindon Romp MD [id number]   Nasal culture     Status: None   Collection Time: 09/26/15  4:24 PM  Result Value Ref Range Status   Specimen Description NOSE  Final   Special Requests NONE  Final   Culture   Final    NORMAL NASOPHARYNGEAL FLORA Performed at [facility]    Report Status 09/29/2015 FINAL  Final  MRSA PCR Screening     Status: None   Collection Time: 09/27/15 11:36 AM  Result Value Ref Range Status   MRSA by PCR NEGATIVE NEGATIVE Final    Comment:        The GeneXpert MRSA Assay (FDA approved for NASAL specimens only), is one component of a comprehensive MRSA colonization surveillance program. It is not intended to diagnose MRSA infection nor to guide or monitor treatment for MRSA infections.   Culture, expectorated sputum-assessment     Status: None   Collection Time: 09/27/15  4:14 PM  Result Value Ref Range Status   Specimen Description SPU  Final   Special Requests Immunocompromised  Final   Sputum evaluation   Final    MICROSCOPIC FINDINGS SUGGEST THAT THIS SPECIMEN IS NOT REPRESENTATIVE OF LOWER RESPIRATORY SECRETIONS. PLEASE RECOLLECT. [place] RN [id number] [place],N    Report Status 09/27/2015 FINAL  Final     Studies: No results found.  Scheduled Meds: . aspirin  81 mg Oral Daily  . calcium carbonate  1 tablet Oral TID  . enoxaparin (LOVENOX) injection  40 mg Subcutaneous Q24H  . famotidine (PEPCID) IV  20 mg Intravenous Q12H  . feeding supplement  1 Container Oral TID BM  . guaiFENesin  600 mg Oral BID  . hydrocortisone sod  succinate (SOLU-CORTEF) inj  100 mg Intravenous Q8H  . ipratropium  0.5 mg Nebulization TID  . lactulose  20 g Oral Daily  . levalbuterol  1.25 mg Nebulization TID  . levothyroxine  88 mcg Oral QAC breakfast  . magic mouthwash w/lidocaine  15 mL Oral QID  . metoprolol  25 mg Oral BID  . oseltamivir  75 mg Oral BID  . potassium & sodium phosphates  1 packet Oral TID WC & HS  . sodium chloride  10-40 mL Intracatheter Q12H  . sodium chloride  3 mL Intravenous Q12H   Continuous Infusions: . dextrose 5 % and 0.9 % NaCl with KCl 20 mEq/L 75 mL/hr at 09/29/15 1541     Time spent: 25 minutes    Shemar Plemmons  [facility] Pager [phone]. If 7PM-7AM, please contact night-coverage at [email or web address], password [facility] 09/29/2015, 7:48 PM  LOS: 5 days

## 2015-09-30 ENCOUNTER — Other Ambulatory Visit: Payer: Self-pay | Admitting: Oncology

## 2015-09-30 DIAGNOSIS — E278 Other specified disorders of adrenal gland: Secondary | ICD-10-CM

## 2015-09-30 DIAGNOSIS — C773 Secondary and unspecified malignant neoplasm of axilla and upper limb lymph nodes: Secondary | ICD-10-CM

## 2015-09-30 DIAGNOSIS — C50412 Malignant neoplasm of upper-outer quadrant of left female breast: Secondary | ICD-10-CM

## 2015-09-30 DIAGNOSIS — D709 Neutropenia, unspecified: Secondary | ICD-10-CM

## 2015-09-30 DIAGNOSIS — R5081 Fever presenting with conditions classified elsewhere: Secondary | ICD-10-CM

## 2015-09-30 LAB — HEPATIC FUNCTION PANEL
ALBUMIN: 2.3 g/dL — AB (ref 3.5–5.0)
ALT: 87 U/L — ABNORMAL HIGH (ref 14–54)
AST: 160 U/L — AB (ref 15–41)
Alkaline Phosphatase: 335 U/L — ABNORMAL HIGH (ref 38–126)
Bilirubin, Direct: 2.2 mg/dL — ABNORMAL HIGH (ref 0.1–0.5)
Indirect Bilirubin: 1.6 mg/dL — ABNORMAL HIGH (ref 0.3–0.9)
TOTAL PROTEIN: 5.1 g/dL — AB (ref 6.5–8.1)
Total Bilirubin: 3.8 mg/dL — ABNORMAL HIGH (ref 0.3–1.2)

## 2015-09-30 LAB — BASIC METABOLIC PANEL
ANION GAP: 11 (ref 5–15)
BUN: 5 mg/dL — ABNORMAL LOW (ref 6–20)
CALCIUM: 6.1 mg/dL — AB (ref 8.9–10.3)
CO2: 22 mmol/L (ref 22–32)
CREATININE: 0.66 mg/dL (ref 0.44–1.00)
Chloride: 107 mmol/L (ref 101–111)
Glucose, Bld: 208 mg/dL — ABNORMAL HIGH (ref 65–99)
Potassium: 3.8 mmol/L (ref 3.5–5.1)
SODIUM: 140 mmol/L (ref 135–145)

## 2015-09-30 LAB — CBC
HEMATOCRIT: 25.4 % — AB (ref 36.0–46.0)
Hemoglobin: 9 g/dL — ABNORMAL LOW (ref 12.0–15.0)
MCH: 36.4 pg — ABNORMAL HIGH (ref 26.0–34.0)
MCHC: 35.4 g/dL (ref 30.0–36.0)
MCV: 102.8 fL — ABNORMAL HIGH (ref 78.0–100.0)
PLATELETS: 110 10*3/uL — AB (ref 150–400)
RBC: 2.47 MIL/uL — AB (ref 3.87–5.11)
RDW: 14.7 % (ref 11.5–15.5)
WBC: 1.7 10*3/uL — AB (ref 4.0–10.5)

## 2015-09-30 LAB — AMMONIA: AMMONIA: 87 umol/L — AB (ref 9–35)

## 2015-09-30 MED ORDER — SODIUM CHLORIDE 0.9 % IV SOLN
1.0000 g | Freq: Once | INTRAVENOUS | Status: AC
  Administered 2015-09-30: 1 g via INTRAVENOUS
  Filled 2015-09-30: qty 10

## 2015-09-30 MED ORDER — AMOXICILLIN-POT CLAVULANATE 875-125 MG PO TABS
1.0000 | ORAL_TABLET | Freq: Two times a day (BID) | ORAL | Status: DC
  Administered 2015-10-01 – 2015-10-04 (×6): 1 via ORAL
  Filled 2015-09-30 (×9): qty 1

## 2015-09-30 NOTE — Progress Notes (Signed)
Maria Pruitt [id number] DOB: 17-Mar-1946 DOA: 09/24/2015 PCP: [name], PA-C  Summary&Daily Progress Notes 09/28/15: I have seen and examined Maria Pruitt at bedside in the presence of her son and a family friend and reviewed her chart. Appreciate oncology. Maria Pruitt is a pleasant 70 year old female with invasive lobular breast carcinoma metastatic to left adrenal gland, regional nodes and bones,(followed by Dr. Jana Hakim, and she was started treatment 8 weeks prior to this admission), atrial fibrillation not anticoagulated because of intracranial bleed, and she presented to [facility] emergency department with several days duration of progressively worsening weakness, nausea and nonbloody vomiting, poor oral intake and fatigue apparently resulting from Influenza Type A with concern for sepsis/pneumonia. Incidentally, she had CT chest that showed left lower lobe pulmonary embolism whose chronicity is not clear at this point. She also has electrolyte imbalances likely related to poor oral intake(hypomagnesemia/hypokalemia). Septic workup remains negative except for influenza. She has transaminitis/hyperammonemia, and it is not clear why she has transaminitis-?sepsis, other causes. She seems to be making progress including fever subsiding but she has mucositis likely related to chemotherapy. She would rather we wait on IVC filter placement while she decides whether to proceed with it, understanding the risk of throwing new clots. Will discontinue vancomycin as septic workup unremarkable so far, continue Zosyn/Tamiflu to complete course of antibiotics, change fluids to D5W normal saline with KCl, and continue to replenish electrolytes as necessary. Meanwhile, will give her Magic mouthwash instead of nystatin for mucositis. Will monitor liver enzymes and consider abdominal imaging if not improving in the next 24-48 hours. We'll follow oncology recommendations. 09/29/15: Appreciate oncology.  Noted discussions regarding IVC filter placement/anticoagulation. Electrolytes remain off. Septic workup remains negative. It is unlikely that there was a bacterial element to patient's acute presentation. We'll therefore discontinue Zosyn, continue Tamiflu to complete 5 day course, replenish electrolytes, and defer anticoagulation decision to oncology. Patient continues to refuse lactulose even though ammonia level higher. Liver enzymes also higher. 09/30/15: Drowsy. Ammonia level up. Has flapping tremor. Low grade fever since Zosyn stopped. Encouraged patient to take lactulose. Maybe aspirating as wet cough.Will add Augmentin, continue Tamiflu, and lactulose. Continue to replenish electrolytes Problem List Plan  Principal Problem:   Sepsis ([place]) Active Problems:   Pneumonia of right upper lobe due to infectious organism   Pyrexia   PE (pulmonary embolism)   Transaminitis   Neutropenic fever (HCC)   Influenza A   Disorder of electrolytes, low Mg, K, Ca, Phosp   Atrial fibrillation with RVR, CHADS 2-3  (HCC)   Breast cancer of upper-outer quadrant of left female breast (HCC)   Breast cancer metastasized to bone (HCC)   Thrombocytopenia (HCC)   Anemia of chronic disease   Acute kidney injury (HCC)   Mucositis oral   Augmentin  Continue Tamiflu  Lactulose  Code Status: Full Code Family Communication: Son and family friend at bedside Disposition Plan: Home this weekend?   Consultants:  Oncology  Procedures:  None  Antibiotics:  Vancomycin 09/26/2015>09/28/2015  Zosyn 09/26/2015>09/29/15  Tamiflu 09/26/2015>  Augmentin 09/30/15>  HPI/Subjective: Rattling sounds in chest when sleeping per daughter. Complains of tremors. 3 BMs today.  Objective: Filed Vitals:   09/30/15 1420 09/30/15 2115  BP: 118/76 124/53  Pulse: 77 86  Temp: 98.2 F (36.8 C) 98.3 F (36.8 C)  Resp: 18 18    Intake/Output Summary (Last 24 hours) at 09/30/15 2353 Last data filed at 09/30/15  1801  Gross per 24 hour  Intake 1706.25 ml  Output      0 ml  Net 1706.25 ml   Filed Weights   09/28/15 0510 09/29/15 0554 09/30/15 0539  Weight: 82.5 kg (181 lb 14.1 oz) 82.3 kg (181 lb 7 oz) 83.9 kg (184 lb 15.5 oz)    Exam:   General:  Comfortable at rest.  Cardiovascular: S1-S2 normal. No murmurs. Pulse regular.  Respiratory: Good air entry bilaterally. No rhonchi or rales.  Abdomen: Soft and nontender. Normal bowel sounds. No organomegaly.  Musculoskeletal: No pedal edema   Neurological: Intact  Data Reviewed: Basic Metabolic Panel:  Recent Labs Lab 09/24/15 2125  09/25/15 1125 09/26/15 0320 09/26/15 1050 09/27/15 0330 09/28/15 0429 09/29/15 0355 09/30/15 0325  NA  --   < >  --  131* 127* 132* 136 139 140  K  --   < >  --  3.4* 3.2* 3.5 3.2* 3.0* 3.8  CL  --   < >  --  95* 93* 100* 104 106 107  CO2  --   < >  --  24 23 22  21* 19* 22  GLUCOSE  --   < >  --  124* 119* 101* 223* 217* 208*  BUN  --   < >  --  7 7 8 7  5* 5*  CREATININE  --   < >  --  1.06* 1.06* 1.34* 0.94 0.89 0.66  CALCIUM  --   < >  --  7.2* 6.9* 6.5* 6.5* 6.3* 6.1*  MG 0.9*  --  1.1* 0.9* 1.3* 1.1* 1.3* 1.6*  --   PHOS 1.1*  --  1.4* 1.8*  --  2.2*  --   --   --   < > = values in this interval not displayed. Liver Function Tests:  Recent Labs Lab 09/25/15 0950 09/26/15 0320 09/28/15 0429 09/29/15 0355 09/30/15 0325  AST 98* 103* 122* 153* 160*  ALT 94* 88* 72* 106* 87*  ALKPHOS 152* 160* 184* 266* 335*  BILITOT 4.2* 4.8* 4.5* 4.1* 3.8*  PROT 5.2* 5.5* 5.3* 5.6* 5.1*  ALBUMIN 2.5* 2.6* 2.4* 2.5* 2.3*   No results for input(s): LIPASE, AMYLASE in the last 168 hours.  Recent Labs Lab 09/24/15 1530 09/28/15 0554 09/29/15 0650 09/30/15 0520  AMMONIA 43* 49* 68* 87*   CBC:  Recent Labs Lab 09/24/15 1530  09/26/15 0320 09/27/15 0330 09/28/15 0429 09/29/15 0355 09/30/15 0325  WBC 1.2*  < > 2.1* 1.7* 2.3* 1.9* 1.7*  NEUTROABS 0.7*  --   --   --   --   --   --   HGB  11.1*  < > 10.5* 9.3* 10.1* 10.2* 9.0*  HCT 30.7*  < > 28.9* 25.9* 28.0* 28.7* 25.4*  MCV 98.7  < > 101.0* 102.0* 101.8* 101.8* 102.8*  PLT 182  < > 148* 118* 126* 139* 110*  < > = values in this interval not displayed. Cardiac Enzymes:  Recent Labs Lab 09/25/15 1125  TROPONINI 0.08*   BNP (last 3 results) No results for input(s): BNP in the last 8760 hours.  ProBNP (last 3 results) No results for input(s): PROBNP in the last 8760 hours.  CBG: No results for input(s): GLUCAP in the last 168 hours.  Recent Results (from the past 240 hour(s))  Culture, blood (Routine X 2) w Reflex to ID Panel     Status: None   Collection Time: 09/24/15  3:25 PM  Result Value Ref Range Status   Specimen Description   Final  BLOOD PORTA CATH BOTTLES DRAWN AEROBIC AND ANAEROBIC   Special Requests 10CC  Final   Culture   Final    NO GROWTH 5 DAYS Performed at [facility]    Report Status 09/29/2015 FINAL  Final  Culture, blood (Routine X 2) w Reflex to ID Panel     Status: None   Collection Time: 09/24/15  4:00 PM  Result Value Ref Range Status   Specimen Description BLOOD BLOOD RIGHT HAND  Final   Special Requests BOTTLES DRAWN AEROBIC AND ANAEROBIC 5CC  Final   Culture   Final    NO GROWTH 5 DAYS Performed at [facility]    Report Status 09/29/2015 FINAL  Final  Urine culture     Status: None   Collection Time: 09/25/15 12:13 AM  Result Value Ref Range Status   Specimen Description URINE, CLEAN CATCH  Final   Special Requests NONE  Final   Culture   Final    NO GROWTH 1 DAY Performed at [facility]    Report Status 09/26/2015 FINAL  Final  Culture, sputum-assessment     Status: None   Collection Time: 09/25/15 11:10 PM  Result Value Ref Range Status   Specimen Description SPU  Final   Special Requests NONE  Final   Sputum evaluation   Final    MICROSCOPIC FINDINGS SUGGEST THAT THIS SPECIMEN IS NOT REPRESENTATIVE OF LOWER RESPIRATORY SECRETIONS. PLEASE  RECOLLECT. HOLT,B RN [id number] [place],N    Report Status 09/26/2015 FINAL  Final  Respiratory virus panel     Status: Abnormal   Collection Time: 09/26/15 12:16 PM  Result Value Ref Range Status   Respiratory Syncytial Virus A Negative Negative Final   Respiratory Syncytial Virus B Negative Negative Final   Influenza A Positive (A) Negative Final    Comment: Subtype: H3   Influenza B Negative Negative Final   Parainfluenza 1 Negative Negative Final   Parainfluenza 2 Negative Negative Final   Parainfluenza 3 Negative Negative Final   Metapneumovirus Negative Negative Final   Rhinovirus Negative Negative Final   Adenovirus Negative Negative Final    Comment: (NOTE) Performed At: [facility] [address] [place], [place] [id number] Lindon Romp MD [id number]   Nasal culture     Status: None   Collection Time: 09/26/15  4:24 PM  Result Value Ref Range Status   Specimen Description NOSE  Final   Special Requests NONE  Final   Culture   Final    NORMAL NASOPHARYNGEAL FLORA Performed at [facility]    Report Status 09/29/2015 FINAL  Final  MRSA PCR Screening     Status: None   Collection Time: 09/27/15 11:36 AM  Result Value Ref Range Status   MRSA by PCR NEGATIVE NEGATIVE Final    Comment:        The GeneXpert MRSA Assay (FDA approved for NASAL specimens only), is one component of a comprehensive MRSA colonization surveillance program. It is not intended to diagnose MRSA infection nor to guide or monitor treatment for MRSA infections.   Culture, expectorated sputum-assessment     Status: None   Collection Time: 09/27/15  4:14 PM  Result Value Ref Range Status   Specimen Description SPU  Final   Special Requests Immunocompromised  Final   Sputum evaluation   Final    MICROSCOPIC FINDINGS SUGGEST THAT THIS SPECIMEN IS NOT REPRESENTATIVE OF LOWER RESPIRATORY SECRETIONS. PLEASE RECOLLECT. [place] RN [place]  Report Status  09/27/2015 FINAL  Final     Studies: No results found.  Scheduled Meds: . [START ON 10/01/2015] amoxicillin-clavulanate  1 tablet Oral Q12H  . aspirin  81 mg Oral Daily  . calcium carbonate  1 tablet Oral TID  . enoxaparin (LOVENOX) injection  40 mg Subcutaneous Q24H  . famotidine (PEPCID) IV  20 mg Intravenous Q12H  . feeding supplement  1 Container Oral TID BM  . guaiFENesin  600 mg Oral BID  . ipratropium  0.5 mg Nebulization TID  . lactulose  20 g Oral Daily  . levalbuterol  1.25 mg Nebulization TID  . levothyroxine  88 mcg Oral QAC breakfast  . magic mouthwash w/lidocaine  15 mL Oral QID  . metoprolol  25 mg Oral BID  . oseltamivir  75 mg Oral BID  . sodium chloride  10-40 mL Intracatheter Q12H  . sodium chloride  3 mL Intravenous Q12H   Continuous Infusions:    Time spent: 25 minutes    Sheri Prows  [facility] Pager [phone]. If 7PM-7AM, please contact night-coverage at [email or web address], password [email or web address] 09/30/2015, 11:53 PM  LOS: 6 days

## 2015-09-30 NOTE — Evaluation (Signed)
Clinical/Bedside Swallow Evaluation Patient Details  Name: Maria Pruitt MRN: [id number] Date of Birth: 18-Nov-1945  Today's Date: 09/30/2015 Time: SLP Start Time (ACUTE ONLY): 1100 SLP Stop Time (ACUTE ONLY): 1125 SLP Time Calculation (min) (ACUTE ONLY): 25 min  Past Medical History:  Past Medical History  Diagnosis Date  . Hypertension   . Breast cancer ([place]) 2011    Stage 3  . Atrial fibrillation ([place])   . Hypercholesterolemia   . Abnormal ECG   . Allergy     seasonal  . Arthritis   . GERD (gastroesophageal reflux disease)   . 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 Seizures ([place])   . Stroke ([place])     dec 15  . Thyroid disease    Past Surgical History:  Past Surgical History  Procedure Laterality Date  . Cesarean section  1980  . Breast enhancement surgery  1987  . Umbilical hernia repair    . Tubal ligation    . Scar revision    . Pituitary surgery      adenoma  . Rhinoplasty  1985  . Breast lumpectomy Left   . Colonoscopy     HPI:  70 y.o. female who was admitted with several days duration of progressively worsening weakness, nausea and nonbloody vomiting, poor oral intake and fatigue. She has previously been diagnosed with invasive lobular breast carcinoma metastatic to left adrenal gland, regional nodes and bones, follows with Dr. Jana Hakim, patient started treatment 8 weeks prior to this admission currently on daily oral chemotherapy, started IV chemotherapy 2 weeks prior to this admission and gets these treatments every 2 weeks   Assessment / Plan / Recommendation Clinical Impression  Patient presents with a functional oropharyngeal swallow, with no overt s/s of aspiration or difficulty with swallow function. Patient's difficulty with her swallowing is likely related to recent chemoradiation, and patiet describes hoarse voice, dry mouth and throat, sore throat. She stated that MD started her on antibiotics and magic mouthwash  and that has  helped. Patient has been managing her dry mouth and sore throat symptoms independently, drinking mostly liquids and eating soft, puree foods, but has started to increase her solid texture consistency    Aspiration Risk  No limitations    Diet Recommendation   Regular solids, thin liquids as tolerated secondary to pain       Other  Recommendations     Follow up Recommendations    N/A   Frequency and Duration   N/A         Prognosis        Swallow Study   General HPI: 70 y.o. female who was admitted with several days duration of progressively worsening weakness, nausea and nonbloody vomiting, poor oral intake and fatigue. She has previously been diagnosed with invasive lobular breast carcinoma metastatic to left adrenal gland, regional nodes and bones, follows with Dr. Jana Hakim, patient started treatment 8 weeks prior to this admission currently on daily oral chemotherapy, started IV chemotherapy 2 weeks prior to this admission and gets these treatments every 2 weeks Type of Study: Bedside Swallow Evaluation Previous Swallow Assessment: N/A Diet Prior to this Study: Regular;Thin liquids Temperature Spikes Noted: N/A Respiratory Status: Room air History of Recent Intubation: No Behavior/Cognition: Cooperative;Alert;Pleasant mood Oral Care Completed by SLP: No Oral Cavity - Dentition: Adequate natural dentition Vision: Functional for self-feeding Self-Feeding Abilities: Able to feed self Patient Positioning: Upright in bed;Postural control adequate for testing Baseline Vocal Quality:  Hoarse Volitional Cough: Strong Volitional Swallow: Able to elicit    Oral/Motor/Sensory Function Overall Oral Motor/Sensory Function: Within functional limits   Ice Chips     Thin Liquid Thin Liquid: Within functional limits    Nectar Thick     Honey Thick     Puree Puree: Not tested   Solid   GO   Solid: Not tested        Nadara Mode Tarrell 09/30/2015,4:07 PM    Sonia Baller, MA, CCC-SLP 09/30/2015 4:07 PM

## 2015-09-30 NOTE — Progress Notes (Signed)
Inpatient Diabetes Program Recommendations  AACE/ADA: New Consensus Statement on Inpatient Glycemic Control (2015)  Target Ranges:  Prepandial:   less than 140 mg/dL      Peak postprandial:   less than 180 mg/dL (1-2 hours)      Critically ill patients:  140 - 180 mg/dL    Results for TWYLLA, ZIEGENFUSS (MRN [id number]) as of 09/30/2015 10:02  Ref. Range 09/28/2015 04:29 09/29/2015 03:55 09/30/2015 03:25  Glucose Latest Ref Range: 65-99 mg/dL 223 (H) 217 (H) 208 (H)       MD- Note patient currently receiving IV Solucortef 100 mg Q8 hours.  Glucose levels elevated >200 mg/dl X 3 days now.  If within goals of care, please consider starting Novolog Sensitive SSI TID AC + HS while patient getting IV steroids      --Will follow patient during hospitalization--  Wyn Quaker RN, MSN, CDE Diabetes Coordinator Inpatient Glycemic Control Team Team Pager: [phone] (8a-5p)

## 2015-09-30 NOTE — Progress Notes (Signed)
Maria Pruitt   DOB:01-02-1946   HE#:[id number]   NTI#:[id number]  Subjective:  Maria Pruitt is still abit confused (she thoght her son was her husband for a while today!) but overall is turning the corner. She is working hard on the lactulose and has had 3 (small) BMs so far today, She is still having some soreness in her mouth and throat. Son, da-in-law and grand-daughter in room  Objective: older White woman examined in bed Filed Vitals:   09/29/15 2222 09/30/15 0539  BP: 119/77 132/78  Pulse: 85 86  Temp: 99 F (37.2 C) 99 F (37.2 C)  Resp: 18 18    Body mass index is 31.73 kg/(m^2).  Intake/Output Summary (Last 24 hours) at 09/30/15 1352 Last data filed at 09/30/15 0325  Gross per 24 hour  Intake   1260 ml  Output      0 ml  Net   1260 ml    CBG (last 3)  No results for input(s): GLUCAP in the last 72 hours.   Labs:  Lab Results  Component Value Date   WBC 1.7* 09/30/2015   HGB 9.0* 09/30/2015   HCT 25.4* 09/30/2015   MCV 102.8* 09/30/2015   PLT 110* 09/30/2015   NEUTROABS 0.7* 09/24/2015    _0 @  Urine Studies No results for input(s): UHGB, CRYS in the last 72 hours.  Invalid input(s): UACOL, UAPR, USPG, UPH, UTP, UGL, UKET, UBIL, UNIT, UROB, ULEU, UEPI, UWBC, URBC, UBAC, CAST, [place], [place]  Basic Metabolic Panel:  Recent Labs Lab 09/24/15 2125  09/25/15 1125 09/26/15 0320 09/26/15 1050 09/27/15 0330 09/28/15 0429 09/29/15 0355 09/30/15 0325  NA  --   < >  --  131* 127* 132* 136 139 140  K  --   < >  --  3.4* 3.2* 3.5 3.2* 3.0* 3.8  CL  --   < >  --  95* 93* 100* 104 106 107  CO2  --   < >  --  _1 21* 19* 22  GLUCOSE  --   < >  --  124* 119* 101* 223* 217* 208*  BUN  --   < >  --  _2 5* 5*  CREATININE  --   < >  --  1.06* 1.06* 1.34* 0.94 0.89 0.66  CALCIUM  --   < >  --  7.2* 6.9* 6.5* 6.5* 6.3* 6.1*  MG 0.9*  --  1.1* 0.9* 1.3* 1.1* 1.3* 1.6*  --   PHOS 1.1*  --  1.4* 1.8*  --  2.2*  --   --   --   < > = values in this  interval not displayed. GFR Estimated Creatinine Clearance: 71.5 mL/min (by C-G formula based on Cr of 0.66). Liver Function Tests:  Recent Labs Lab 09/25/15 0950 09/26/15 0320 09/28/15 0429 09/29/15 0355 09/30/15 0325  AST 98* 103* 122* 153* 160*  ALT 94* 88* 72* 106* 87*  ALKPHOS 152* 160* 184* 266* 335*  BILITOT 4.2* 4.8* 4.5* 4.1* 3.8*  PROT 5.2* 5.5* 5.3* 5.6* 5.1*  ALBUMIN 2.5* 2.6* 2.4* 2.5* 2.3*   No results for input(s): LIPASE, AMYLASE in the last 168 hours.  Recent Labs Lab 09/24/15 1530 09/28/15 0554 09/29/15 0650 09/30/15 0520  AMMONIA 43* 49* 68* 87*   Coagulation profile  Recent Labs Lab 09/24/15 2125  INR 1.20    CBC:  Recent Labs Lab 09/24/15 1530  09/26/15 0320 09/27/15 0330 09/28/15 0429 09/29/15 0355 09/30/15 0325  WBC 1.2*  < > 2.1* 1.7* 2.3* 1.9* 1.7*  NEUTROABS 0.7*  --   --   --   --   --   --   HGB 11.1*  < > 10.5* 9.3* 10.1* 10.2* 9.0*  HCT 30.7*  < > 28.9* 25.9* 28.0* 28.7* 25.4*  MCV 98.7  < > 101.0* 102.0* 101.8* 101.8* 102.8*  PLT 182  < > 148* 118* 126* 139* 110*  < > = values in this interval not displayed. Cardiac Enzymes:  Recent Labs Lab 09/25/15 1125  TROPONINI 0.08*   BNP: Invalid input(s): POCBNP CBG: No results for input(s): GLUCAP in the last 168 hours. D-Dimer No results for input(s): DDIMER in the last 72 hours. Hgb A1c No results for input(s): HGBA1C in the last 72 hours. Lipid Profile No results for input(s): CHOL, HDL, LDLCALC, TRIG, CHOLHDL, LDLDIRECT in the last 72 hours. Thyroid function studies No results for input(s): TSH, T4TOTAL, T3FREE, THYROIDAB in the last 72 hours.  Invalid input(s): FREET3 Anemia work up No results for input(s): VITAMINB12, FOLATE, FERRITIN, TIBC, IRON, RETICCTPCT in the last 72 hours. Microbiology Recent Results (from the past 240 hour(s))  Culture, blood (Routine X 2) w Reflex to ID Panel     Status: None   Collection Time: 09/24/15  3:25 PM  Result Value Ref  Range Status   Specimen Description   Final    BLOOD PORTA CATH BOTTLES DRAWN AEROBIC AND ANAEROBIC   Special Requests 10CC  Final   Culture   Final    NO GROWTH 5 DAYS Performed at [facility]    Report Status 09/29/2015 FINAL  Final  Culture, blood (Routine X 2) w Reflex to ID Panel     Status: None   Collection Time: 09/24/15  4:00 PM  Result Value Ref Range Status   Specimen Description BLOOD BLOOD RIGHT HAND  Final   Special Requests BOTTLES DRAWN AEROBIC AND ANAEROBIC 5CC  Final   Culture   Final    NO GROWTH 5 DAYS Performed at [facility]    Report Status 09/29/2015 FINAL  Final  Urine culture     Status: None   Collection Time: 09/25/15 12:13 AM  Result Value Ref Range Status   Specimen Description URINE, CLEAN CATCH  Final   Special Requests NONE  Final   Culture   Final    NO GROWTH 1 DAY Performed at [facility]    Report Status 09/26/2015 FINAL  Final  Culture, sputum-assessment     Status: None   Collection Time: 09/25/15 11:10 PM  Result Value Ref Range Status   Specimen Description SPU  Final   Special Requests NONE  Final   Sputum evaluation   Final    MICROSCOPIC FINDINGS SUGGEST THAT THIS SPECIMEN IS NOT REPRESENTATIVE OF LOWER RESPIRATORY SECRETIONS. PLEASE RECOLLECT. HOLT,B RN [id number] COVINGTON,N    Report Status 09/26/2015 FINAL  Final  Respiratory virus panel     Status: Abnormal   Collection Time: 09/26/15 12:16 PM  Result Value Ref Range Status   Respiratory Syncytial Virus A Negative Negative Final   Respiratory Syncytial Virus B Negative Negative Final   Influenza A Positive (A) Negative Final    Comment: Subtype: H3   Influenza B Negative Negative Final   Parainfluenza 1 Negative Negative Final   Parainfluenza 2 Negative Negative Final   Parainfluenza 3 Negative Negative Final   Metapneumovirus Negative Negative Final   Rhinovirus Negative Negative Final  Adenovirus Negative Negative Final    Comment:  (NOTE) Performed At: [facility] [place], [place] [id number] Lindon Romp MD WL:[id number]   Nasal culture     Status: None   Collection Time: 09/26/15  4:24 PM  Result Value Ref Range Status   Specimen Description NOSE  Final   Special Requests NONE  Final   Culture   Final    NORMAL NASOPHARYNGEAL FLORA Performed at [facility]    Report Status 09/29/2015 FINAL  Final  MRSA PCR Screening     Status: None   Collection Time: 09/27/15 11:36 AM  Result Value Ref Range Status   MRSA by PCR NEGATIVE NEGATIVE Final    Comment:        The GeneXpert MRSA Assay (FDA approved for NASAL specimens only), is one component of a comprehensive MRSA colonization surveillance program. It is not intended to diagnose MRSA infection nor to guide or monitor treatment for MRSA infections.   Culture, expectorated sputum-assessment     Status: None   Collection Time: 09/27/15  4:14 PM  Result Value Ref Range Status   Specimen Description SPU  Final   Special Requests Immunocompromised  Final   Sputum evaluation   Final    MICROSCOPIC FINDINGS SUGGEST THAT THIS SPECIMEN IS NOT REPRESENTATIVE OF LOWER RESPIRATORY SECRETIONS. PLEASE RECOLLECT. Saxman RN [phone] COVINGTON,N    Report Status 09/27/2015 FINAL  Final      Studies:  Dg Chest 2 View  09/27/2015  CLINICAL DATA:  Fever, shortness of breath and productive cough. Recently diagnosed pulmonary embolism. EXAM: CHEST  2 VIEW COMPARISON:  Previous examinations. FINDINGS: Stable enlarged cardiac silhouette. Stable right jugular porta catheter. Left breast implant with capsular calcifications. Left breast and left axillary surgical clips. Clear lungs. Diffuse osteopenia. IMPRESSION: No acute abnormality. Electronically Signed   By: Claudie Revering M.D.   On: 09/27/2015 15:31   Ct Angio Chest Pe W/cm &/or Wo Cm  09/26/2015  CLINICAL DATA:  Inpatient. Shortness of breath. Metastatic breast cancer. EXAM: CT  ANGIOGRAPHY CHEST WITH CONTRAST TECHNIQUE: Multidetector CT imaging of the chest was performed using the standard protocol during bolus administration of intravenous contrast. Multiplanar CT image reconstructions and MIPs were obtained to evaluate the vascular anatomy. CONTRAST:  11m OMNIPAQUE IOHEXOL 350 MG/ML SOLN COMPARISON:  06/17/2015 PET-CT . FINDINGS: Mediastinum/Nodes: The study is high quality for the evaluation of pulmonary embolism. There is an acute subsegmental pulmonary embolus in the left lower lobe (series 7/ image 135). No additional pulmonary emboli. Mildly atherosclerotic nonaneurysmal thoracic aorta. Dilated main pulmonary artery (3.5 cm diameter), not appreciably changed from 06/17/2015. Normal heart size. No CT evidence of right heart strain. No pericardial fluid/thickening. Right internal jugular MediPort terminates at the cavoatrial junction. Normal visualized thyroid. Normal esophagus. Left axillary surgical clips are again noted. No axillary adenopathy. There are new mildly enlarged right supraclavicular lymph nodes, largest 1.0 cm (series 5/image 6). There is a mildly enlarged 1.0 cm right paratracheal node (series 5/image 22), mildly increased from 0.9 cm. No additional pathologically enlarged mediastinal or hilar nodes. Lungs/Pleura: No pneumothorax. No pleural effusion. Stable 4 mm anterior right lower lobe pulmonary nodule (series 8/ image 45). Subcentimeter calcified granuloma in the right lower lobe. Stable sharply marginated reticulation and ground-glass opacity in the anterior left upper lobe in keeping with radiation fibrosis. No acute consolidative airspace disease or new significant pulmonary nodules. Upper abdomen: Small hiatal hernia. Partially visualized left adrenal metastasis. Musculoskeletal: Sclerotic lesion  in the T9 vertebral body appears slightly increased in size. Mild-to-moderate degenerative changes in the thoracic spine. Intact appearing bilateral breast  prostheses. Review of the MIP images confirms the above findings. IMPRESSION: 1. Acute subsegmental left lower lobe pulmonary embolus. 2. Stable chronic main pulmonary artery dilation, suggesting chronic pulmonary arterial hypertension. 3. No CT evidence of right heart strain. 4. New mild right supraclavicular and right paratracheal mediastinal lymphadenopathy, suspicious for metastatic disease. 5. Slight growth of T9 vertebral body sclerotic lesion, probably a bone metastasis . 6. Partially visualized left adrenal metastasis. 7. Stable tiny right lower lobe pulmonary nodule. Critical Value/emergent results were called by telephone at the time of interpretation on 09/26/2015 at 11:05 am to Dr. Florencia Reasons , who verbally acknowledged these results. Electronically Signed   By: Ilona Sorrel M.D.   On: 09/26/2015 11:13   US Abdomen Complete  09/24/2015  CLINICAL DATA:  Transaminitis for labs.  Initial encounter. EXAM: ABDOMEN ULTRASOUND COMPLETE COMPARISON:  MRI of the abdomen performed 08/23/2015 FINDINGS: Gallbladder: No gallstones or wall thickening visualized. Mild internal echoes within the gallbladder may reflect mild sludge. No sonographic Murphy sign noted by sonographer. Common bile duct: Diameter: 0.3 cm, within normal limits in caliber. Liver: No focal lesion identified. There is a mildly nodular contour to the liver, raising concern for some degree of hepatic cirrhosis. Within normal limits in parenchymal echogenicity. IVC: Not well characterized due to overlying structures. Pancreas: Visualized portion unremarkable. Spleen: Size and appearance within normal limits. Right Kidney: Length: 14.6 cm. Echogenicity within normal limits. No mass or hydronephrosis visualized. Left Kidney:  Congenitally absent, per patient. Abdominal aorta: Not well characterized distally due to overlying bowel gas. No evidence of aneurysmal dilatation. Other findings: None. IMPRESSION: 1. Mildly nodular contour of the liver raises  concern for some degree of hepatic cirrhosis, which may explain the patient's transaminitis. 2. No focal hepatic lesions seen on ultrasound, though if the patient's transaminitis significantly worsens, dynamic liver protocol MRI or CT could be considered for further evaluation. 3. Mild internal echoes within the gallbladder may reflect mild sludge. Gallbladder otherwise unremarkable. Electronically Signed   By: Garald Balding M.D.   On: 09/24/2015 19:06   Ir Fluoro Guide Cv Line Right  09/08/2015  CLINICAL DATA:  70 year old female with a history of breast cancer. She has been referred for port catheter placement EXAM: IR RIGHT FLOURO GUIDE CV LINE; IR ULTRASOUND GUIDANCE VASC ACCESS RIGHT Date: 09/08/2015 ANESTHESIA/SEDATION: Moderate (conscious) sedation was administered during this procedure. A total of 4.0 mg Versed and 50 mg Fentanyl were administered intravenously. The patient's vital signs were monitored continuously by radiology nursing throughout the course of the procedure. Total sedation time: 25 minutes FLUOROSCOPY TIME:  18 second TECHNIQUE: The procedure, risks, benefits, and alternatives were explained to the patient. Questions regarding the procedure were encouraged and answered. The patient understands and consents to the procedure. Ultrasound survey was performed with images stored and sent to PACs. The right neck and chest was prepped with chlorhexidine, and draped in the usual sterile fashion using maximum barrier technique (cap and mask, sterile gown, sterile gloves, large sterile sheet, hand hygiene and cutaneous antiseptic). Antibiotic prophylaxis was provided with 2.0g Ancef administered IV one hour prior to skin incision. Local anesthesia was attained by infiltration with 1% lidocaine without epinephrine. Ultrasound demonstrated patency of the right internal jugular vein, and this was documented with an image. Under real-time ultrasound guidance, this vein was accessed with a 21 gauge  micropuncture needle and image  documentation was performed. A small dermatotomy was made at the access site with an 11 scalpel. A 0.018" wire was advanced into the SVC and used to estimate the length of the internal catheter. The access needle exchanged for a 80F micropuncture vascular sheath. The 0.018" wire was then removed and a 0.035" wire advanced into the IVC. An appropriate location for the subcutaneous reservoir was selected below the clavicle and an incision was made through the skin and underlying soft tissues. The subcutaneous tissues were then dissected using a combination of blunt and sharp surgical technique and a pocket was formed. A single lumen power injectable portacatheter was then tunneled through the subcutaneous tissues from the pocket to the dermatotomy and the port reservoir placed within the subcutaneous pocket. The venous access site was then serially dilated and a peel away vascular sheath placed over the wire. The wire was removed and the port catheter advanced into position under fluoroscopic guidance. The catheter tip is positioned in the cavoatrial junction. This was documented with a spot image. The portacatheter was then tested and found to flush and aspirate well. The port was flushed with saline followed by 100 units/mL heparinized saline. The pocket was then closed in two layers using first subdermal inverted interrupted absorbable sutures followed by a running subcuticular suture. The epidermis was then sealed with Dermabond. The dermatotomy at the venous access site was also seal with Dermabond. Patient tolerated the procedure well and remained hemodynamically stable throughout. No complications encountered and no significant blood loss encountered COMPLICATIONS: None.  The patient tolerated the procedure well. IMPRESSION: Status post placement of right IJ port.  Catheter ready for use. Signed, Dulcy Fanny. Earleen Newport, DO [facility]  Radiology Electronically Signed   By: Corrie Mckusick D.O.   On: 09/08/2015 18:38   Ir US Guide Vasc Access Right  09/08/2015  CLINICAL DATA:  70 year old female with a history of breast cancer. She has been referred for port catheter placement EXAM: IR RIGHT FLOURO GUIDE CV LINE; IR ULTRASOUND GUIDANCE VASC ACCESS RIGHT Date: 09/08/2015 ANESTHESIA/SEDATION: Moderate (conscious) sedation was administered during this procedure. A total of 4.0 mg Versed and 50 mg Fentanyl were administered intravenously. The patient's vital signs were monitored continuously by radiology nursing throughout the course of the procedure. Total sedation time: 25 minutes FLUOROSCOPY TIME:  18 second TECHNIQUE: The procedure, risks, benefits, and alternatives were explained to the patient. Questions regarding the procedure were encouraged and answered. The patient understands and consents to the procedure. Ultrasound survey was performed with images stored and sent to PACs. The right neck and chest was prepped with chlorhexidine, and draped in the usual sterile fashion using maximum barrier technique (cap and mask, sterile gown, sterile gloves, large sterile sheet, hand hygiene and cutaneous antiseptic). Antibiotic prophylaxis was provided with 2.0g Ancef administered IV one hour prior to skin incision. Local anesthesia was attained by infiltration with 1% lidocaine without epinephrine. Ultrasound demonstrated patency of the right internal jugular vein, and this was documented with an image. Under real-time ultrasound guidance, this vein was accessed with a 21 gauge micropuncture needle and image documentation was performed. A small dermatotomy was made at the access site with an 11 scalpel. A 0.018" wire was advanced into the SVC and used to estimate the length of the internal catheter. The access needle exchanged for a 80F micropuncture vascular sheath. The 0.018" wire was then removed and a 0.035" wire advanced into the IVC. An  appropriate location for the subcutaneous  reservoir was selected below the clavicle and an incision was made through the skin and underlying soft tissues. The subcutaneous tissues were then dissected using a combination of blunt and sharp surgical technique and a pocket was formed. A single lumen power injectable portacatheter was then tunneled through the subcutaneous tissues from the pocket to the dermatotomy and the port reservoir placed within the subcutaneous pocket. The venous access site was then serially dilated and a peel away vascular sheath placed over the wire. The wire was removed and the port catheter advanced into position under fluoroscopic guidance. The catheter tip is positioned in the cavoatrial junction. This was documented with a spot image. The portacatheter was then tested and found to flush and aspirate well. The port was flushed with saline followed by 100 units/mL heparinized saline. The pocket was then closed in two layers using first subdermal inverted interrupted absorbable sutures followed by a running subcuticular suture. The epidermis was then sealed with Dermabond. The dermatotomy at the venous access site was also seal with Dermabond. Patient tolerated the procedure well and remained hemodynamically stable throughout. No complications encountered and no significant blood loss encountered COMPLICATIONS: None.  The patient tolerated the procedure well. IMPRESSION: Status post placement of right IJ port.  Catheter ready for use. Signed, Dulcy Fanny. Earleen Newport, DO [facility] Electronically Signed   By: Corrie Mckusick D.O.   On: 09/08/2015 18:38   Dg Chest Port 1 View  09/24/2015  CLINICAL DATA:  Fever.  Breast carcinoma. EXAM: PORTABLE CHEST 1 VIEW COMPARISON:  Jan 16, 2011 FINDINGS: Port-A-Cath tip is in the superior vena cava. No pneumothorax. There is ill-defined opacity in the right upper lobe, concerning for pneumonia. Lungs  elsewhere appear clear. Heart is upper normal in size with pulmonary vascularity within normal limits. No apparent adenopathy. There is postoperative change on the left with clips in left axillary region. There are breast implants bilaterally. No bone lesions. IMPRESSION: Ill-defined opacity right upper lobe, concerning for pneumonia. Lungs elsewhere appear clear. No adenopathy apparent. Electronically Signed   By: Lowella Grip III M.D.   On: 09/24/2015 15:45     Assessment: 70 y.o. [place], [place] woman admitted with febrile neutropenia and viral penumonitis, with elevated LFTs and ammonia  (1) status post left breast biopsy in February of 2011 for an invasive lobular carcinoma measuring 2.9 cm by MRI, with a positive prechemotherapy axillary lymph node biopsy, strongly estrogen and progesterone receptor positive with no evidence of HER-2/neu amplification and an MIB-1 of 14%.   (2) received 6 cycles of docetaxel/ cyclophosphamide in the neoadjuvant setting, completed in mid July of 2011   (3) s/p left lumpectomy and axillary lymph node dissection in August of 2011 for a ypT1b yTN3 (14 positive lymph nodes), Stage IIIC, grade 1 residual tumor. HER-2/neu was repeated, again not amplified.   (4) Completed loco-regional radiation in late October of 2011   (5) on letrozole starting early November 2011, continued to October 2016, with progression  METASTATIC DISEASE: September 2016: Involving left adrenal gland, regional nodes and bones  (6) biopsy of a 7.5 cm left adrenal mass 05/30/2015 showed metastatic carcinoma, estrogen, progesterone, and HER-2 negative, with an MIB-1 of 90-100%; the tumor cells were cytokeratin 7 positive, cytokeratin 20 focally positive, gross cystic disease fluid protein negative (a) the CA-27-29 is informative (b) PET scan 06/17/2015 shows, in addition to the left adrenal mass, some periaortic adenopathy and multiple hypermetabolic  bone metastases (L2, T9, T2 and  others) (c) EGD on 06/16/2015 to evaluate suspicious cardiac wall thickening showed no evidence of malignancy  (7) capecitabine started 07/04/2015 at 1.5 g BID 7/7. Discontinued 08/29/15 with progression  (8) denosumab/ Xgeva started 07/18/2015, repeated monthly  (9) eribulin D1/D8 q 21 days started 09/14/15   Plan: Janelli is now day 17 cycle 1 of her eribulin chemotherapy. Her blood counts have largely recovered (will add differential to AM CBC to check neutrophils) and her LFTs are trending down. She is much more alert and "herslef.". Anticipate she may be able to go home in AM.  As far as anticoagulation is concerned she will remain on lovenox 40 mg sQ daily indefinitely or until she opts for an IVC filter.  She will see me again 1/30 at noon and will resume chemotherapy then.  Greatly appreciate your help to this patient!   [name], MD 09/30/2015  1:52 PM Medical Oncology and Hematology [facility] [address] [place], [place] [address] Tel. [phone]    Fax. [phone]

## 2015-10-01 ENCOUNTER — Telehealth: Payer: Self-pay | Admitting: Nurse Practitioner

## 2015-10-01 LAB — HEPATIC FUNCTION PANEL
ALT: 102 U/L — AB (ref 14–54)
AST: 183 U/L — AB (ref 15–41)
Albumin: 2.4 g/dL — ABNORMAL LOW (ref 3.5–5.0)
Alkaline Phosphatase: 419 U/L — ABNORMAL HIGH (ref 38–126)
BILIRUBIN INDIRECT: 1.7 mg/dL — AB (ref 0.3–0.9)
Bilirubin, Direct: 2.3 mg/dL — ABNORMAL HIGH (ref 0.1–0.5)
TOTAL PROTEIN: 5.4 g/dL — AB (ref 6.5–8.1)
Total Bilirubin: 4 mg/dL — ABNORMAL HIGH (ref 0.3–1.2)

## 2015-10-01 LAB — BASIC METABOLIC PANEL
ANION GAP: 12 (ref 5–15)
BUN: 8 mg/dL (ref 6–20)
CO2: 21 mmol/L — ABNORMAL LOW (ref 22–32)
Calcium: 6.1 mg/dL — CL (ref 8.9–10.3)
Chloride: 108 mmol/L (ref 101–111)
Creatinine, Ser: 0.56 mg/dL (ref 0.44–1.00)
Glucose, Bld: 134 mg/dL — ABNORMAL HIGH (ref 65–99)
POTASSIUM: 3.8 mmol/L (ref 3.5–5.1)
SODIUM: 141 mmol/L (ref 135–145)

## 2015-10-01 LAB — MAGNESIUM: Magnesium: 1.4 mg/dL — ABNORMAL LOW (ref 1.7–2.4)

## 2015-10-01 LAB — CBC
HCT: 27.6 % — ABNORMAL LOW (ref 36.0–46.0)
Hemoglobin: 9.6 g/dL — ABNORMAL LOW (ref 12.0–15.0)
MCH: 36.5 pg — ABNORMAL HIGH (ref 26.0–34.0)
MCHC: 34.8 g/dL (ref 30.0–36.0)
MCV: 104.9 fL — ABNORMAL HIGH (ref 78.0–100.0)
PLATELETS: 122 10*3/uL — AB (ref 150–400)
RBC: 2.63 MIL/uL — AB (ref 3.87–5.11)
RDW: 15.6 % — ABNORMAL HIGH (ref 11.5–15.5)
WBC: 2.4 10*3/uL — AB (ref 4.0–10.5)

## 2015-10-01 LAB — AMMONIA: Ammonia: 99 umol/L — ABNORMAL HIGH (ref 9–35)

## 2015-10-01 MED ORDER — LACTULOSE 10 GM/15ML PO SOLN
20.0000 g | Freq: Two times a day (BID) | ORAL | Status: DC
  Administered 2015-10-01 – 2015-10-03 (×3): 20 g via ORAL
  Filled 2015-10-01 (×5): qty 30

## 2015-10-01 MED ORDER — VITAMINS A & D EX OINT
TOPICAL_OINTMENT | CUTANEOUS | Status: AC
  Administered 2015-10-01: 15:00:00
  Filled 2015-10-01: qty 5

## 2015-10-01 MED ORDER — SODIUM CHLORIDE 0.9 % IV SOLN
1.0000 g | Freq: Once | INTRAVENOUS | Status: AC
  Administered 2015-10-01: 1 g via INTRAVENOUS
  Filled 2015-10-01: qty 10

## 2015-10-01 MED ORDER — MAGNESIUM SULFATE 2 GM/50ML IV SOLN
2.0000 g | Freq: Once | INTRAVENOUS | Status: AC
  Administered 2015-10-01: 2 g via INTRAVENOUS
  Filled 2015-10-01: qty 50

## 2015-10-01 MED ORDER — FUROSEMIDE 10 MG/ML IJ SOLN
20.0000 mg | Freq: Once | INTRAMUSCULAR | Status: AC
  Administered 2015-10-01: 20 mg via INTRAVENOUS
  Filled 2015-10-01: qty 2

## 2015-10-01 MED ORDER — LEVALBUTEROL HCL 0.63 MG/3ML IN NEBU
0.6300 mg | INHALATION_SOLUTION | RESPIRATORY_TRACT | Status: DC | PRN
  Administered 2015-10-01: 0.63 mg via RESPIRATORY_TRACT
  Filled 2015-10-01: qty 3

## 2015-10-01 MED ORDER — GUAIFENESIN-DM 100-10 MG/5ML PO SYRP
5.0000 mL | ORAL_SOLUTION | ORAL | Status: DC | PRN
  Administered 2015-10-01: 5 mL via ORAL
  Filled 2015-10-01: qty 10

## 2015-10-01 NOTE — Telephone Encounter (Signed)
Spoke with son on cell and the patient is in the hospital and they will get these appointments at d/c from the hospital

## 2015-10-01 NOTE — Progress Notes (Signed)
OSMARY SCHLEIFER [id number] DOB: [birth year] DOA: 09/24/2015 PCP: [name], PA-C  Summary&Daily Progress Notes 09/28/15: I have seen and examined [name] at bedside in the presence of her son and a family friend and reviewed her chart. Appreciate oncology. Alajia Tait is a pleasant 70 year old female with invasive lobular breast carcinoma metastatic to left adrenal gland, regional nodes and bones,(followed by Dr. Jana Hakim, and she was started treatment 8 weeks prior to this admission), atrial fibrillation not anticoagulated because of intracranial bleed, and she presented to [facility] long emergency department with several days duration of progressively worsening weakness, nausea and nonbloody vomiting, poor oral intake and fatigue apparently resulting from Influenza Type A with concern for sepsis/pneumonia. Incidentally, she had CT chest that showed left lower lobe pulmonary embolism whose chronicity is not clear at this point. She also has electrolyte imbalances likely related to poor oral intake(hypomagnesemia/hypokalemia). Septic workup remains negative except for influenza. She has transaminitis/hyperammonemia, and it is not clear why she has transaminitis-?sepsis, other causes. She seems to be making progress including fever subsiding but she has mucositis likely related to chemotherapy. She would rather we wait on IVC filter placement while she decides whether to proceed with it, understanding the risk of throwing new clots. Will discontinue vancomycin as septic workup unremarkable so far, continue Zosyn/Tamiflu to complete course of antibiotics, change fluids to D5W normal saline with KCl, and continue to replenish electrolytes as necessary. Meanwhile, will give her Magic mouthwash instead of nystatin for mucositis. Will monitor liver enzymes and consider abdominal imaging if not improving in the next 24-48 hours. We'll follow oncology recommendations. 09/29/15: Appreciate oncology.  Noted discussions regarding IVC filter placement/anticoagulation. Electrolytes remain off. Septic workup remains negative. It is unlikely that there was a bacterial element to patient's acute presentation. We'll therefore discontinue Zosyn, continue Tamiflu to complete 5 day course, replenish electrolytes, and defer anticoagulation decision to oncology. Patient continues to refuse lactulose even though ammonia level higher. Liver enzymes also higher. 09/30/15: Drowsy. Ammonia level up. Has flapping tremor. Low grade fever since Zosyn stopped. Encouraged patient to take lactulose. Maybe aspirating as wet cough.Will add Augmentin, continue Tamiflu, and lactulose. Continue to replenish electrolytes 10/01/15: Liver enzymes going up, so is the ammonia level. Patient sleepy.  Will obtain MRCP to evaluate increasing LFTs and increase lactulose frequency to bid, and continue Tamiflu.  Problem List Plan  Principal Problem:   Sepsis ([place]) Active Problems:   Pneumonia of right upper lobe due to infectious organism   Pyrexia   PE (pulmonary embolism)   Transaminitis   Neutropenic fever (HCC)   Influenza A   Disorder of electrolytes, low Mg, K, Ca, Phosp   Atrial fibrillation with RVR, CHADS 2-3  (HCC)   Breast cancer of upper-outer quadrant of left female breast (HCC)   Breast cancer metastasized to bone (HCC)   Thrombocytopenia (HCC)   Anemia of chronic disease   Acute kidney injury (HCC)   Mucositis oral    MRCP   Increase lactulose frequency to twice a day   Continue Tamiflu   Code Status: Full Code Family Communication: Son and family friend at bedside Disposition Plan: Home early next week.   Consultants:  Oncology  Procedures:  None  Antibiotics:  Vancomycin 09/26/2015>09/28/2015  Zosyn 09/26/2015>09/29/15  Tamiflu 09/26/2015>  Augmentin 09/30/15>  HPI/Subjective: Sleepy.  Objective: Filed Vitals:   10/01/15 1421 10/01/15 2119  BP: 115/76 122/81  Pulse:  82  Temp: 97.9  F (36.6 C) 98 F (36.7 C)  Resp:  18 18    Intake/Output Summary (Last 24 hours) at 10/01/15 2144 Last data filed at 10/01/15 2100  Gross per 24 hour  Intake    670 ml  Output    925 ml  Net   -255 ml   Filed Weights   09/29/15 0554 09/30/15 0539 10/01/15 0600  Weight: 82.3 kg (181 lb 7 oz) 83.9 kg (184 lb 15.5 oz) 86 kg (189 lb 9.5 oz)    Exam:   General:  Comfortable at rest.  Cardiovascular: S1-S2 normal. No murmurs. Pulse regular.  Respiratory: Good air entry bilaterally. No rhonchi or rales.  Abdomen: Soft and nontender. Normal bowel sounds. No organomegaly.  Musculoskeletal: No pedal edema   Neurological: Intact  Data Reviewed: Basic Metabolic Panel:  Recent Labs Lab 09/25/15 1125 09/26/15 0320 09/26/15 1050 09/27/15 0330 09/28/15 0429 09/29/15 0355 09/30/15 0325 10/01/15 0445  NA  --  131* 127* 132* 136 139 140 141  K  --  3.4* 3.2* 3.5 3.2* 3.0* 3.8 3.8  CL  --  95* 93* 100* 104 106 107 108  CO2  --  24 23 22  21* 19* 22 21*  GLUCOSE  --  124* 119* 101* 223* 217* 208* 134*  BUN  --  7 7 8 7  5* 5* 8  CREATININE  --  1.06* 1.06* 1.34* 0.94 0.89 0.66 0.56  CALCIUM  --  7.2* 6.9* 6.5* 6.5* 6.3* 6.1* 6.1*  MG 1.1* 0.9* 1.3* 1.1* 1.3* 1.6*  --  1.4*  PHOS 1.4* 1.8*  --  2.2*  --   --   --   --    Liver Function Tests:  Recent Labs Lab 09/26/15 0320 09/28/15 0429 09/29/15 0355 09/30/15 0325 10/01/15 0445  AST 103* 122* 153* 160* 183*  ALT 88* 72* 106* 87* 102*  ALKPHOS 160* 184* 266* 335* 419*  BILITOT 4.8* 4.5* 4.1* 3.8* 4.0*  PROT 5.5* 5.3* 5.6* 5.1* 5.4*  ALBUMIN 2.6* 2.4* 2.5* 2.3* 2.4*   No results for input(s): LIPASE, AMYLASE in the last 168 hours.  Recent Labs Lab 09/28/15 0554 09/29/15 0650 09/30/15 0520 10/01/15 0445  AMMONIA 49* 68* 87* 99*   CBC:  Recent Labs Lab 09/27/15 0330 09/28/15 0429 09/29/15 0355 09/30/15 0325 10/01/15 0445  WBC 1.7* 2.3* 1.9* 1.7* 2.4*  HGB 9.3* 10.1* 10.2* 9.0* 9.6*  HCT 25.9* 28.0*  28.7* 25.4* 27.6*  MCV 102.0* 101.8* 101.8* 102.8* 104.9*  PLT 118* 126* 139* 110* 122*   Cardiac Enzymes:  Recent Labs Lab 09/25/15 1125  TROPONINI 0.08*   BNP (last 3 results) No results for input(s): BNP in the last 8760 hours.  ProBNP (last 3 results) No results for input(s): PROBNP in the last 8760 hours.  CBG: No results for input(s): GLUCAP in the last 168 hours.  Recent Results (from the past 240 hour(s))  Culture, blood (Routine X 2) w Reflex to ID Panel     Status: None   Collection Time: 09/24/15  3:25 PM  Result Value Ref Range Status   Specimen Description   Final    BLOOD PORTA CATH BOTTLES DRAWN AEROBIC AND ANAEROBIC   Special Requests 10CC  Final   Culture   Final    NO GROWTH 5 DAYS Performed at [facility]    Report Status 09/29/2015 FINAL  Final  Culture, blood (Routine X 2) w Reflex to ID Panel     Status: None   Collection Time: 09/24/15  4:00 PM  Result Value Ref Range  Status   Specimen Description BLOOD BLOOD RIGHT HAND  Final   Special Requests BOTTLES DRAWN AEROBIC AND ANAEROBIC 5CC  Final   Culture   Final    NO GROWTH 5 DAYS Performed at [facility]    Report Status 09/29/2015 FINAL  Final  Urine culture     Status: None   Collection Time: 09/25/15 12:13 AM  Result Value Ref Range Status   Specimen Description URINE, CLEAN CATCH  Final   Special Requests NONE  Final   Culture   Final    NO GROWTH 1 DAY Performed at [facility]    Report Status 09/26/2015 FINAL  Final  Culture, sputum-assessment     Status: None   Collection Time: 09/25/15 11:10 PM  Result Value Ref Range Status   Specimen Description SPU  Final   Special Requests NONE  Final   Sputum evaluation   Final    MICROSCOPIC FINDINGS SUGGEST THAT THIS SPECIMEN IS NOT REPRESENTATIVE OF LOWER RESPIRATORY SECRETIONS. PLEASE RECOLLECT. HOLT,B RN [id number] COVINGTON,N    Report Status 09/26/2015 FINAL  Final  Respiratory virus panel     Status:  Abnormal   Collection Time: 09/26/15 12:16 PM  Result Value Ref Range Status   Respiratory Syncytial Virus A Negative Negative Final   Respiratory Syncytial Virus B Negative Negative Final   Influenza A Positive (A) Negative Final    Comment: Subtype: H3   Influenza B Negative Negative Final   Parainfluenza 1 Negative Negative Final   Parainfluenza 2 Negative Negative Final   Parainfluenza 3 Negative Negative Final   Metapneumovirus Negative Negative Final   Rhinovirus Negative Negative Final   Adenovirus Negative Negative Final    Comment: (NOTE) Performed At: [facility] [address] [place], [place] [id number] Lindon Romp MD [id number]   Nasal culture     Status: None   Collection Time: 09/26/15  4:24 PM  Result Value Ref Range Status   Specimen Description NOSE  Final   Special Requests NONE  Final   Culture   Final    NORMAL NASOPHARYNGEAL FLORA Performed at [facility]    Report Status 09/29/2015 FINAL  Final  MRSA PCR Screening     Status: None   Collection Time: 09/27/15 11:36 AM  Result Value Ref Range Status   MRSA by PCR NEGATIVE NEGATIVE Final    Comment:        The GeneXpert MRSA Assay (FDA approved for NASAL specimens only), is one component of a comprehensive MRSA colonization surveillance program. It is not intended to diagnose MRSA infection nor to guide or monitor treatment for MRSA infections.   Culture, expectorated sputum-assessment     Status: None   Collection Time: 09/27/15  4:14 PM  Result Value Ref Range Status   Specimen Description SPU  Final   Special Requests Immunocompromised  Final   Sputum evaluation   Final    MICROSCOPIC FINDINGS SUGGEST THAT THIS SPECIMEN IS NOT REPRESENTATIVE OF LOWER RESPIRATORY SECRETIONS. PLEASE RECOLLECT. L[place] RN [id number] COVINGTON,N    Report Status 09/27/2015 FINAL  Final     Studies: No results found.  Scheduled Meds: . amoxicillin-clavulanate  1 tablet Oral Q12H   . aspirin  81 mg Oral Daily  . calcium carbonate  1 tablet Oral TID  . enoxaparin (LOVENOX) injection  40 mg Subcutaneous Q24H  . famotidine (PEPCID) IV  20 mg Intravenous Q12H  . feeding supplement  1 Container Oral TID BM  .  ipratropium  0.5 mg Nebulization TID  . lactulose  20 g Oral BID  . levalbuterol  1.25 mg Nebulization TID  . levothyroxine  88 mcg Oral QAC breakfast  . magic mouthwash w/lidocaine  15 mL Oral QID  . metoprolol  25 mg Oral BID  . sodium chloride  10-40 mL Intracatheter Q12H  . sodium chloride  3 mL Intravenous Q12H   Continuous Infusions:    Time spent: 25 minutes    Nashanti Duquette  [facility] Pager [phone]. If 7PM-7AM, please contact night-coverage at [email or web address], password [facility] 10/01/2015, 9:44 PM  LOS: 7 days

## 2015-10-01 NOTE — Evaluation (Signed)
Physical Therapy Evaluation Patient Details Name: Maria Pruitt MRN: [id number] DOB: December 21, 1945 Today's Date: 10/01/2015   History of Present Illness  Patient is 70 year old female with invasive lobular breast carcinoma metastatic to left adrenal gland, regional nodes and bones, follows with Dr. Jana Hakim, patient started treatment 8 weeks prior to this admission currently on daily oral chemotherapy, started IV chemotherapy 2 weeks prior to this admission and gets these treatments every 2 weeks. She now presents to [facility] emergency department with several days duration of progressively worsening weakness, nausea and nonbloody vomiting, poPatient is 70 year old female with invasive lobular breast carcinoma metastatic to left adrenal gland, regional nodes and bones, follows with Dr. Jana Hakim, patient started treatment 8 weeks prior to this admission currently on daily oral chemotherapy, started IV chemotherapy 2 weeks prior to this admission and gets these treatments every 2 weeks. She now presents to [facility] long emergency department with several days duration of progressively worsening weakness, nausea and nonbloody vomiting, poor oral intake and fatigue. Patient also reports subjective fevers and chills, cough mixed nonproductive and productive clear sputum. Patient also reports intermittent chest discomfort that occurs with coughing spellsor oral intake and fatigue. Patient also reports subjective fevers and chills, cough mixed nonproductive and productive clear sputum. Patient also reports intermittent chest discomfort that occurs with coughing spells  Clinical Impression  Pt and pt's family in room. Pt with slight discoordination open chain in B ankle movements, but not noted in walking. Pt denies neuropathy at this time in feet or B hands, and walked in room several times with me with no LOB and no SOB. Tolerated nicely. Educated pt to do this periodically with staff or family, not alone yet.  Pt agreed, and felt no need for HHPT at this time. No further skilled PT needs at this time, will sign off. Thanks     Follow Up Recommendations No PT follow up    Equipment Recommendations  None recommended by PT    Recommendations for Other Services       Precautions / Restrictions Precautions Precautions: None      Mobility  Bed Mobility Overal bed mobility: Independent                Transfers Overall transfer level: Independent                  Ambulation/Gait Ambulation/Gait assistance: Min guard Ambulation Distance (Feet): 50 Feet (limited due to walign in room due to droplets precautions) Assistive device: None Gait Pattern/deviations: Step-to pattern     General Gait Details: fairly steady on her feet withturning and walkignin narrow places. not too SOB and n/c of diziness as well.   Stairs            Wheelchair Mobility    Modified Rankin (Stroke Patients Only)       Balance Overall balance assessment: No apparent balance deficits (not formally assessed)                                           Pertinent Vitals/Pain      Home Living Family/patient expects to be discharged to:: Private residence Living Arrangements: Spouse/significant other Available Help at Discharge: Family Type of Home: House Home Access: Stairs to enter Entrance Stairs-Rails: Right Entrance Stairs-Number of Steps: 2-3 Home Layout: Laundry or work area in basement (they are finishing off art work area in  the basement for her husband. 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 Pt's family in room and they plan on helping in shifts as they can. The live near by and have several folks who help. Pt does not drive anymore so requires rides to all appointments.     Prior Function Level of Independence:  Independent         Comments: pt does not drive anymore.      Hand Dominance        Extremity/Trunk Assessment               Lower Extremity Assessment: Overall WFL for tasks assessed         Communication   Communication: No difficulties  Cognition Arousal/Alertness: Awake/alert Behavior During Therapy: WFL for tasks assessed/performed Overall Cognitive Status: Within Functional Limits for tasks assessed                      General Comments      Exercises        Assessment/Plan    PT Assessment Patent does not need any further PT services  PT Diagnosis Generalized weakness   PT Problem List    PT Treatment Interventions     PT Goals (Current goals can be found in the Care Plan section) Acute Rehab PT Goals PT Goal Formulation: All assessment and education complete, DC therapy    Frequency     Barriers to discharge        Co-evaluation               End of Session Equipment Utilized During Treatment: Gait belt Activity Tolerance: Patient tolerated treatment well Patient left: in bed;with call bell/phone within reach;with family/visitor present           Time: [id number] PT Time Calculation (min) (ACUTE ONLY): 22 min   Charges:   PT Evaluation $PT Eval Low Complexity: 1 Procedure     PT G CodesClide Dales 10/28/15, 4:32 PM Clide Dales, PT Pager: [phone] 10-28-15

## 2015-10-01 NOTE — Progress Notes (Signed)
CRITICAL VALUE ALERT  Critical value received:  Calcium 6.1  Date of notification:  10/01/15  Time of notification:  0532  Critical value read back:Yes.    Nurse who received alert:  J.Tyleek Smick  MD notified (1st page):  T.Callahan  Time of first page:  0530  MD notified (2nd page):  Time of second page:  Responding MD: Fredirick Maudlin  Time MD responded:  [phone]

## 2015-10-02 ENCOUNTER — Inpatient Hospital Stay (HOSPITAL_COMMUNITY): Payer: Medicare Other

## 2015-10-02 LAB — CBC
HEMATOCRIT: 29.8 % — AB (ref 36.0–46.0)
Hemoglobin: 10.2 g/dL — ABNORMAL LOW (ref 12.0–15.0)
MCH: 35.8 pg — AB (ref 26.0–34.0)
MCHC: 34.2 g/dL (ref 30.0–36.0)
MCV: 104.6 fL — AB (ref 78.0–100.0)
Platelets: 141 10*3/uL — ABNORMAL LOW (ref 150–400)
RBC: 2.85 MIL/uL — ABNORMAL LOW (ref 3.87–5.11)
RDW: 15.7 % — AB (ref 11.5–15.5)
WBC: 3.4 10*3/uL — ABNORMAL LOW (ref 4.0–10.5)

## 2015-10-02 LAB — HEPATIC FUNCTION PANEL
ALT: 102 U/L — ABNORMAL HIGH (ref 14–54)
AST: 175 U/L — AB (ref 15–41)
Albumin: 2.4 g/dL — ABNORMAL LOW (ref 3.5–5.0)
Alkaline Phosphatase: 516 U/L — ABNORMAL HIGH (ref 38–126)
BILIRUBIN DIRECT: 2.8 mg/dL — AB (ref 0.1–0.5)
BILIRUBIN TOTAL: 4.8 mg/dL — AB (ref 0.3–1.2)
Indirect Bilirubin: 2 mg/dL — ABNORMAL HIGH (ref 0.3–0.9)
Total Protein: 5.3 g/dL — ABNORMAL LOW (ref 6.5–8.1)

## 2015-10-02 LAB — BASIC METABOLIC PANEL
ANION GAP: 12 (ref 5–15)
BUN: 6 mg/dL (ref 6–20)
CALCIUM: 6.6 mg/dL — AB (ref 8.9–10.3)
CO2: 25 mmol/L (ref 22–32)
CREATININE: 0.62 mg/dL (ref 0.44–1.00)
Chloride: 106 mmol/L (ref 101–111)
GFR calc Af Amer: >60 mL/min (ref >60)
GFR calc non Af Amer: >60 mL/min (ref >60)
GLUCOSE: 141 mg/dL — AB (ref 65–99)
Potassium: 2.9 mmol/L — ABNORMAL LOW (ref 3.5–5.1)
Sodium: 143 mmol/L (ref 135–145)

## 2015-10-02 LAB — PROTIME-INR
INR: 1.18 (ref 0.00–1.49)
Prothrombin Time: 15.1 seconds (ref 11.6–15.2)

## 2015-10-02 LAB — AMMONIA: Ammonia: 66 umol/L — ABNORMAL HIGH (ref 9–35)

## 2015-10-02 MED ORDER — CALCIUM CHLORIDE 10 % IV SOLN
1.0000 g | Freq: Once | INTRAVENOUS | Status: AC
  Administered 2015-10-02: 1 g via INTRAVENOUS
  Filled 2015-10-02: qty 10

## 2015-10-02 MED ORDER — ADULT MULTIVITAMIN W/MINERALS CH
1.0000 | ORAL_TABLET | Freq: Every day | ORAL | Status: DC
  Administered 2015-10-04: 1 via ORAL
  Filled 2015-10-02 (×3): qty 1

## 2015-10-02 MED ORDER — FAMOTIDINE 20 MG PO TABS
20.0000 mg | ORAL_TABLET | Freq: Two times a day (BID) | ORAL | Status: DC
  Administered 2015-10-03 – 2015-10-04 (×3): 20 mg via ORAL
  Filled 2015-10-02 (×5): qty 1

## 2015-10-02 MED ORDER — GADOBENATE DIMEGLUMINE 529 MG/ML IV SOLN
20.0000 mL | Freq: Once | INTRAVENOUS | Status: AC | PRN
  Administered 2015-10-02: 17 mL via INTRAVENOUS

## 2015-10-02 MED ORDER — POTASSIUM CHLORIDE 10 MEQ/100ML IV SOLN
10.0000 meq | INTRAVENOUS | Status: AC
Start: 2015-10-02 — End: 2015-10-02
  Administered 2015-10-02: 10 meq via INTRAVENOUS
  Filled 2015-10-02: qty 100

## 2015-10-02 MED ORDER — POTASSIUM CHLORIDE 10 MEQ/100ML IV SOLN
10.0000 meq | INTRAVENOUS | Status: AC
  Administered 2015-10-03 (×3): 10 meq via INTRAVENOUS
  Filled 2015-10-02 (×2): qty 100

## 2015-10-02 MED ORDER — MAGNESIUM SULFATE 2 GM/50ML IV SOLN
2.0000 g | Freq: Once | INTRAVENOUS | Status: AC
  Administered 2015-10-02: 2 g via INTRAVENOUS
  Filled 2015-10-02: qty 50

## 2015-10-02 NOTE — Progress Notes (Signed)
Key Points: Use following P&T approved IV to PO non-antibiotic change policy.  Description contains the criteria that are approved Note: Policy Excludes:  Esophagectomy patientsPHARMACIST - PHYSICIAN COMMUNICATION DR:   Sanjuana Letters CONCERNING: IV to Oral Route Change Policy  RECOMMENDATION: This patient is receiving Pepcid by the intravenous route.  Based on criteria approved by the Pharmacy and Therapeutics Committee, the intravenous medication(s) is/are being converted to the equivalent oral dose form(s).   DESCRIPTION: These criteria include:  The patient is eating (either orally or via tube) and/or has been taking other orally administered medications for a least 24 hours  The patient has no evidence of active gastrointestinal bleeding or impaired GI absorption (gastrectomy, short bowel, patient on TNA or NPO).  If you have questions about this conversion, please contact the Pharmacy Department  []   [phone] )  [facility] Na []   [phone] )  [facility]ac[facility] Pontes  []   [phone] )  [facility] [x]   [phone] )  [place], [place], [facility] 10/02/2015 12:19 PM

## 2015-10-02 NOTE — Progress Notes (Signed)
Maria Pruitt [id number] DOB: 1946-03-23 DOA: 09/24/2015 PCP: Maria Nova, PA-C Assessment at time of admission on 09/24/15 Patient is 70 year old female with invasive lobular breast carcinoma metastatic to left adrenal gland, regional nodes and bones, follows with Dr. Jana Pruitt, patient started treatment 8 weeks prior to this admission currently on daily oral chemotherapy, started IV chemotherapy 2 weeks prior to this admission and gets these treatments every 2 weeks. She now presents to [facility] long emergency department with several days duration of progressively worsening weakness, nausea and nonbloody vomiting, poor oral intake and fatigue. Patient also reports subjective fevers and chills, cough mixed nonproductive and productive clear sputum. Patient also reports intermittent chest discomfort that occurs with coughing spells. Patient denies any specific sick contacts or exposures. She reports abdominal discomfort that occurs with vomiting episodes. Last chemotherapy on Tuesday, 4 days prior to this admission.  In emergency department, patient noted to be in mild distress due to discomfort, vital signs notable for T1 100.7, heart rate up to 110, blood work notable for WBC 1.2, hemoglobin 11, platelets 182, sodium 131, potassium 2.6, creatinine 1.51. Chest x-ray worrisome for developing pneumonia. TRH asked to admit for further evaluation and management of sepsis secondary to pneumonia with underlying febrile neutropenia. Summary&Daily Progress Notes since admission 09/28/15: I have seen and examined [name] at bedside in the presence of her son and a family friend and reviewed her chart. Appreciate oncology. Maria Pruitt is a pleasant 70 year old female with invasive lobular breast carcinoma metastatic to left adrenal gland, regional nodes and bones,(followed by Dr. Jana Pruitt, and she was started treatment 8 weeks prior to this admission), atrial fibrillation not anticoagulated because  of intracranial bleed, and she presented to [facility] long emergency department with several days duration of progressively worsening weakness, nausea and nonbloody vomiting, poor oral intake and fatigue apparently resulting from Influenza Type A with concern for sepsis/pneumonia. Incidentally, she had CT chest that showed left lower lobe pulmonary embolism whose chronicity is not clear at this point. She also has electrolyte imbalances likely related to poor oral intake(hypomagnesemia/hypokalemia). Septic workup remains negative except for influenza. She has transaminitis/hyperammonemia, and it is not clear why she has transaminitis-?sepsis, other causes. She seems to be making progress including fever subsiding but she has mucositis likely related to chemotherapy. She would rather we wait on IVC filter placement while she decides whether to proceed with it, understanding the risk of throwing new clots. Will discontinue vancomycin as septic workup unremarkable so far, continue Zosyn/Tamiflu to complete course of antibiotics, change fluids to D5W normal saline with KCl, and continue to replenish electrolytes as necessary. Meanwhile, will give her Magic mouthwash instead of nystatin for mucositis. Will monitor liver enzymes and consider abdominal imaging if not improving in the next 24-48 hours. We'll follow oncology recommendations. 09/29/15: Appreciate oncology. Noted discussions regarding IVC filter placement/anticoagulation. Electrolytes remain off. Septic workup remains negative. It is unlikely that there was a bacterial element to patient's acute presentation. We'll therefore discontinue Zosyn, continue Tamiflu to complete 5 day course, replenish electrolytes, and defer anticoagulation decision to oncology. Patient continues to refuse lactulose even though ammonia level higher. Liver enzymes also higher. 09/30/15: Drowsy. Ammonia level up. Has flapping tremor. Low grade fever since Zosyn stopped. Encouraged  patient to take lactulose. Maybe aspirating as wet cough.Will add Augmentin, continue Tamiflu, and lactulose. Continue to replenish electrolytes 10/01/15: Liver enzymes going up, so is the ammonia level. Patient sleepy. Will obtain MRCP to evaluate increasing LFTs and increase lactulose frequency to  bid, and continue Tamiflu. 10/02/15: MRCP shows "1. Stable cirrhotic changes involving the liver. No worrisome hepatic lesions. 2. Interval decrease in size of the left adrenal mass and retroperitoneal lymphadenopathy. 3. Stable L2 metastatic lesion", transaminases not improving but ammonia at least better and patient's sensorium seems to be clear up. She has finished course of Tamiflu. She has very poor appetite. Will add multivitamin, continue Augmentin to complete course, and continue lactulose. Problem List Plan  Principal Problem:   Sepsis ([place]) Active Problems:   Pneumonia of right upper lobe due to infectious organism   Pyrexia   PE (pulmonary embolism)   Transaminitis   Neutropenic fever (HCC)   Influenza A   Disorder of electrolytes, low Mg, K, Ca, Phosp   Atrial fibrillation with RVR, CHADS 2-3  (HCC)   Breast cancer of upper-outer quadrant of left female breast (HCC)   Breast cancer metastasized to bone (HCC)   Thrombocytopenia (HCC)   Anemia of chronic disease   Acute kidney injury (HCC)   Mucositis oral    multivitamin  Continue Augmentin  Continue lactulose   Follow oncology recommendations   Code Status: Full Code Family Communication: Son and family friend at bedside Disposition Plan: Home early this week.   Consultants:  Oncology  Procedures:  None  Antibiotics:  Vancomycin 09/26/2015>09/28/2015  Zosyn 09/26/2015>09/29/15  Tamiflu 09/26/2015>10/01/15  Augmentin 09/30/15>  HPI/Subjective: Has not bedside. Had diarrhea.  Objective: Filed Vitals:   10/02/15 0410 10/02/15 1345  BP: 136/92 125/73  Pulse: 81 82  Temp: 98 F (36.7 C)   Resp: 18 20     Intake/Output Summary (Last 24 hours) at 10/02/15 2035 Last data filed at 10/02/15 1846  Gross per 24 hour  Intake    410 ml  Output   2025 ml  Net  -1615 ml   Filed Weights   09/30/15 0539 10/01/15 0600 10/02/15 0410  Weight: 83.9 kg (184 lb 15.5 oz) 86 kg (189 lb 9.5 oz) 83 kg (182 lb 15.7 oz)    Exam:   General:  Comfortable at rest.  Cardiovascular: S1-S2 normal. No murmurs. Pulse regular.  Respiratory: Good air entry bilaterally. No rhonchi or rales.  Abdomen: Soft and nontender. Normal bowel sounds. No organomegaly.  Musculoskeletal: No pedal edema   Neurological: Intact  Data Reviewed: Basic Metabolic Panel:  Recent Labs Lab 09/26/15 0320 09/26/15 1050 09/27/15 0330 09/28/15 0429 09/29/15 0355 09/30/15 0325 10/01/15 0445 10/02/15 0520  NA 131* 127* 132* 136 139 140 141 143  K 3.4* 3.2* 3.5 3.2* 3.0* 3.8 3.8 2.9*  CL 95* 93* 100* 104 106 107 108 106  CO2 24 23 22  21* 19* 22 21* 25  GLUCOSE 124* 119* 101* 223* 217* 208* 134* 141*  BUN 7 7 8 7  5* 5* 8 6  CREATININE 1.06* 1.06* 1.34* 0.94 0.89 0.66 0.56 0.62  CALCIUM 7.2* 6.9* 6.5* 6.5* 6.3* 6.1* 6.1* 6.6*  MG 0.9* 1.3* 1.1* 1.3* 1.6*  --  1.4*  --   PHOS 1.8*  --  2.2*  --   --   --   --   --    Liver Function Tests:  Recent Labs Lab 09/28/15 0429 09/29/15 0355 09/30/15 0325 10/01/15 0445 10/02/15 0520  AST 122* 153* 160* 183* 175*  ALT 72* 106* 87* 102* 102*  ALKPHOS 184* 266* 335* 419* 516*  BILITOT 4.5* 4.1* 3.8* 4.0* 4.8*  PROT 5.3* 5.6* 5.1* 5.4* 5.3*  ALBUMIN 2.4* 2.5* 2.3* 2.4* 2.4*   No results for  input(s): LIPASE, AMYLASE in the last 168 hours.  Recent Labs Lab 09/28/15 0554 09/29/15 0650 09/30/15 0520 10/01/15 0445 10/02/15 0505  AMMONIA 49* 68* 87* 99* 66*   CBC:  Recent Labs Lab 09/28/15 0429 09/29/15 0355 09/30/15 0325 10/01/15 0445 10/02/15 0520  WBC 2.3* 1.9* 1.7* 2.4* 3.4*  HGB 10.1* 10.2* 9.0* 9.6* 10.2*  HCT 28.0* 28.7* 25.4* 27.6* 29.8*  MCV 101.8*  101.8* 102.8* 104.9* 104.6*  PLT 126* 139* 110* 122* 141*   Cardiac Enzymes: No results for input(s): CKTOTAL, CKMB, CKMBINDEX, TROPONINI in the last 168 hours. BNP (last 3 results) No results for input(s): BNP in the last 8760 hours.  ProBNP (last 3 results) No results for input(s): PROBNP in the last 8760 hours.  CBG: No results for input(s): GLUCAP in the last 168 hours.  Recent Results (from the past 240 hour(s))  Culture, blood (Routine X 2) w Reflex to ID Panel     Status: None   Collection Time: 09/24/15  3:25 PM  Result Value Ref Range Status   Specimen Description   Final    BLOOD PORTA CATH BOTTLES DRAWN AEROBIC AND ANAEROBIC   Special Requests 10CC  Final   Culture   Final    NO GROWTH 5 DAYS Performed at [facility]    Report Status 09/29/2015 FINAL  Final  Culture, blood (Routine X 2) w Reflex to ID Panel     Status: None   Collection Time: 09/24/15  4:00 PM  Result Value Ref Range Status   Specimen Description BLOOD BLOOD RIGHT HAND  Final   Special Requests BOTTLES DRAWN AEROBIC AND ANAEROBIC 5CC  Final   Culture   Final    NO GROWTH 5 DAYS Performed at [facility]    Report Status 09/29/2015 FINAL  Final  Urine culture     Status: None   Collection Time: 09/25/15 12:13 AM  Result Value Ref Range Status   Specimen Description URINE, CLEAN CATCH  Final   Special Requests NONE  Final   Culture   Final    NO GROWTH 1 DAY Performed at [facility]    Report Status 09/26/2015 FINAL  Final  Culture, sputum-assessment     Status: None   Collection Time: 09/25/15 11:10 PM  Result Value Ref Range Status   Specimen Description SPU  Final   Special Requests NONE  Final   Sputum evaluation   Final    MICROSCOPIC FINDINGS SUGGEST THAT THIS SPECIMEN IS NOT REPRESENTATIVE OF LOWER RESPIRATORY SECRETIONS. PLEASE RECOLLECT. HOLT,B RN [id number] [place],N    Report Status 09/26/2015 FINAL  Final  Respiratory virus panel     Status:  Abnormal   Collection Time: 09/26/15 12:16 PM  Result Value Ref Range Status   Respiratory Syncytial Virus A Negative Negative Final   Respiratory Syncytial Virus B Negative Negative Final   Influenza A Positive (A) Negative Final    Comment: Subtype: H3   Influenza B Negative Negative Final   Parainfluenza 1 Negative Negative Final   Parainfluenza 2 Negative Negative Final   Parainfluenza 3 Negative Negative Final   Metapneumovirus Negative Negative Final   Rhinovirus Negative Negative Final   Adenovirus Negative Negative Final    Comment: (NOTE) Performed At: [facility] [address] [place], [place] [id number] Lindon Romp MD [id number]   Nasal culture     Status: None   Collection Time: 09/26/15  4:24 PM  Result Value Ref Range Status  Specimen Description NOSE  Final   Special Requests NONE  Final   Culture   Final    NORMAL NASOPHARYNGEAL FLORA Performed at [facility]    Report Status 09/29/2015 FINAL  Final  MRSA PCR Screening     Status: None   Collection Time: 09/27/15 11:36 AM  Result Value Ref Range Status   MRSA by PCR NEGATIVE NEGATIVE Final    Comment:        The GeneXpert MRSA Assay (FDA approved for NASAL specimens only), is one component of a comprehensive MRSA colonization surveillance program. It is not intended to diagnose MRSA infection nor to guide or monitor treatment for MRSA infections.   Culture, expectorated sputum-assessment     Status: None   Collection Time: 09/27/15  4:14 PM  Result Value Ref Range Status   Specimen Description SPU  Final   Special Requests Immunocompromised  Final   Sputum evaluation   Final    MICROSCOPIC FINDINGS SUGGEST THAT THIS SPECIMEN IS NOT REPRESENTATIVE OF LOWER RESPIRATORY SECRETIONS. PLEASE RECOLLECT. [place] RN [id number] Section    Report Status 09/27/2015 FINAL  Final     Studies: Mr 3d Recon At Scanner  10/15/15  CLINICAL DATA:  Metastatic breast cancer. EXAM:  MRI ABDOMEN WITHOUT AND WITH CONTRAST (INCLUDING MRCP) TECHNIQUE: Multiplanar multisequence MR imaging of the abdomen was performed both before and after the administration of intravenous contrast. Heavily T2-weighted images of the biliary and pancreatic ducts were obtained, and three-dimensional MRCP images were rendered by post processing. CONTRAST:  55mL MULTIHANCE GADOBENATE DIMEGLUMINE 529 MG/ML IV SOLN COMPARISON:  MRI 08/23/2015 FINDINGS: Examination is limited by breathing motion artifact. Lower chest: No obvious lung base lesions or pleural effusion. No pericardial effusion. Bilateral breast prosthesis noted. Hepatobiliary: Stable cirrhotic changes involving the liver. No early arterial phase enhancing lesions to suggest dysplastic nodule or HCC. No intrahepatic biliary dilatation. Normal caliber and course of the common bile duct. Normal gallbladder. Pancreas: No pancreatic mass, inflammation or ductal dilatation. Spleen: Normal size.  No focal lesions. Adrenals/Urinary Tract: The left adrenal gland mass is smaller. It previously measured 43.5 x 20.5 mm and now measures 35 x 11.5 mm. 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 The right kidney demonstrates compensatory hypertrophy. No renal lesions or hydronephrosis. Stomach/Bowel: The stomach, duodenum, visualized small bowel and visualize colon are grossly normal. Vascular/Lymphatic: Persistent but improved retroperitoneal lymphadenopathy. Largest nodal mass between the atretic left kidney and the aorta measures 30.5 x 25.5 mm on series 1102, image 60. This previously measured 47 x 39 mm. The aorta and branch vessels are patent. The major venous structures are patent. Other: No ascites. Stable anterior abdominal wall hernia containing fat. Musculoskeletal: No significant change in L2 vertebral body lesion. No new osseous lesions. IMPRESSION: 1. Stable cirrhotic changes involving the liver. No worrisome hepatic lesions. 2. Interval decrease in size of the left  adrenal mass and retroperitoneal lymphadenopathy. 3. Stable L2 metastatic lesion. Electronically Signed   By: Marijo Sanes M.D.   On: Oct 15, 2015 13:53   Mr Abd W/wo Cm/mrcp  10-15-15  CLINICAL DATA:  Metastatic breast cancer. EXAM: MRI ABDOMEN WITHOUT AND WITH CONTRAST (INCLUDING MRCP) TECHNIQUE: Multiplanar multisequence MR imaging of the abdomen was performed both before and after the administration of intravenous contrast. Heavily T2-weighted images of the biliary and pancreatic ducts were obtained, and three-dimensional MRCP images were rendered by post processing. CONTRAST:  54mL MULTIHANCE GADOBENATE DIMEGLUMINE 529 MG/ML IV SOLN COMPARISON:  MRI 08/23/2015 FINDINGS: Examination is limited  by breathing motion artifact. Lower chest: No obvious lung base lesions or pleural effusion. No pericardial effusion. Bilateral breast prosthesis noted. Hepatobiliary: Stable cirrhotic changes involving the liver. No early arterial phase enhancing lesions to suggest dysplastic nodule or HCC. No intrahepatic biliary dilatation. Normal caliber and course of the common bile duct. Normal gallbladder. Pancreas: No pancreatic mass, inflammation or ductal dilatation. Spleen: Normal size.  No focal lesions. Adrenals/Urinary Tract: The left adrenal gland mass is smaller. It previously measured 43.5 x 20.5 mm and now measures 35 x 11.5 mm. 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 The right kidney demonstrates compensatory hypertrophy. No renal lesions or hydronephrosis. Stomach/Bowel: The stomach, duodenum, visualized small bowel and visualize colon are grossly normal. Vascular/Lymphatic: Persistent but improved retroperitoneal lymphadenopathy. Largest nodal mass between the atretic left kidney and the aorta measures 30.5 x 25.5 mm on series 1102, image 60. This previously measured 47 x 39 mm. The aorta and branch vessels are patent. The major venous structures are patent. Other: No ascites. Stable anterior abdominal wall hernia  containing fat. Musculoskeletal: No significant change in L2 vertebral body lesion. No new osseous lesions. IMPRESSION: 1. Stable cirrhotic changes involving the liver. No worrisome hepatic lesions. 2. Interval decrease in size of the left adrenal mass and retroperitoneal lymphadenopathy. 3. Stable L2 metastatic lesion. Electronically Signed   By: Marijo Sanes M.D.   On: 10/02/2015 13:53    Scheduled Meds: . amoxicillin-clavulanate  1 tablet Oral Q12H  . aspirin  81 mg Oral Daily  . calcium carbonate  1 tablet Oral TID  . calcium chloride  1 g Intravenous Once  . enoxaparin (LOVENOX) injection  40 mg Subcutaneous Q24H  . famotidine  20 mg Oral BID  . feeding supplement  1 Container Oral TID BM  . ipratropium  0.5 mg Nebulization TID  . lactulose  20 g Oral BID  . levalbuterol  1.25 mg Nebulization TID  . levothyroxine  88 mcg Oral QAC breakfast  . magic mouthwash w/lidocaine  15 mL Oral QID  . metoprolol  25 mg Oral BID  . multivitamin with minerals  1 tablet Oral Daily  . potassium chloride  10 mEq Intravenous Q1 Hr x 4  . sodium chloride  10-40 mL Intracatheter Q12H  . sodium chloride  3 mL Intravenous Q12H   Continuous Infusions:    Time spent: 25 minutes    Zephyra Bernardi  [facility] Pager [phone]. If 7PM-7AM, please contact night-coverage at [email or web address], password [facility] 10/02/2015, 8:35 PM  LOS: 8 days

## 2015-10-03 ENCOUNTER — Ambulatory Visit: Payer: Medicare Other

## 2015-10-03 ENCOUNTER — Other Ambulatory Visit: Payer: Medicare Other

## 2015-10-03 ENCOUNTER — Ambulatory Visit: Payer: Medicare Other | Admitting: Nurse Practitioner

## 2015-10-03 DIAGNOSIS — K7460 Unspecified cirrhosis of liver: Secondary | ICD-10-CM

## 2015-10-03 DIAGNOSIS — R740 Nonspecific elevation of levels of transaminase and lactic acid dehydrogenase [LDH]: Secondary | ICD-10-CM

## 2015-10-03 DIAGNOSIS — R932 Abnormal findings on diagnostic imaging of liver and biliary tract: Secondary | ICD-10-CM

## 2015-10-03 DIAGNOSIS — Z170 Estrogen receptor positive status [ER+]: Secondary | ICD-10-CM

## 2015-10-03 LAB — CBC WITH DIFFERENTIAL/PLATELET
Basophils Absolute: 0 10*3/uL (ref 0.0–0.1)
Basophils Relative: 0 %
EOS PCT: 0 %
Eosinophils Absolute: 0 10*3/uL (ref 0.0–0.7)
HCT: 28.5 % — ABNORMAL LOW (ref 36.0–46.0)
HEMOGLOBIN: 10.1 g/dL — AB (ref 12.0–15.0)
LYMPHS ABS: 1.3 10*3/uL (ref 0.7–4.0)
LYMPHS PCT: 30 %
MCH: 36.3 pg — AB (ref 26.0–34.0)
MCHC: 35.4 g/dL (ref 30.0–36.0)
MCV: 102.5 fL — AB (ref 78.0–100.0)
MONOS PCT: 14 %
Monocytes Absolute: 0.6 10*3/uL (ref 0.1–1.0)
Neutro Abs: 2.5 10*3/uL (ref 1.7–7.7)
Neutrophils Relative %: 56 %
PLATELETS: 137 10*3/uL — AB (ref 150–400)
RBC: 2.78 MIL/uL — AB (ref 3.87–5.11)
RDW: 15.8 % — ABNORMAL HIGH (ref 11.5–15.5)
WBC: 4.4 10*3/uL (ref 4.0–10.5)

## 2015-10-03 LAB — MAGNESIUM: Magnesium: 1.5 mg/dL — ABNORMAL LOW (ref 1.7–2.4)

## 2015-10-03 LAB — BASIC METABOLIC PANEL
Anion gap: 8 (ref 5–15)
CHLORIDE: 103 mmol/L (ref 101–111)
CO2: 29 mmol/L (ref 22–32)
Calcium: 7 mg/dL — ABNORMAL LOW (ref 8.9–10.3)
Creatinine, Ser: 0.44 mg/dL (ref 0.44–1.00)
GFR calc Af Amer: >60 mL/min (ref >60)
GFR calc non Af Amer: >60 mL/min (ref >60)
GLUCOSE: 109 mg/dL — AB (ref 65–99)
POTASSIUM: 3.4 mmol/L — AB (ref 3.5–5.1)
Sodium: 140 mmol/L (ref 135–145)

## 2015-10-03 LAB — AMMONIA: Ammonia: 78 umol/L — ABNORMAL HIGH (ref 9–35)

## 2015-10-03 LAB — GAMMA GT: GGT: 1096 U/L — AB (ref 7–50)

## 2015-10-03 LAB — PHOSPHORUS: Phosphorus: 1.5 mg/dL — ABNORMAL LOW (ref 2.5–4.6)

## 2015-10-03 LAB — FERRITIN: Ferritin: 2310 ng/mL — ABNORMAL HIGH (ref 11–307)

## 2015-10-03 MED ORDER — RIFAXIMIN 550 MG PO TABS
550.0000 mg | ORAL_TABLET | Freq: Two times a day (BID) | ORAL | Status: DC
  Administered 2015-10-03 – 2015-10-04 (×3): 550 mg via ORAL
  Filled 2015-10-03 (×3): qty 1

## 2015-10-03 MED ORDER — LACTULOSE 10 GM/15ML PO SOLN
30.0000 g | Freq: Three times a day (TID) | ORAL | Status: DC
  Administered 2015-10-03 – 2015-10-04 (×3): 30 g via ORAL
  Filled 2015-10-03 (×4): qty 45

## 2015-10-03 MED ORDER — POTASSIUM PHOSPHATES 15 MMOLE/5ML IV SOLN
15.0000 mmol | Freq: Once | INTRAVENOUS | Status: AC
  Administered 2015-10-03: 15 mmol via INTRAVENOUS
  Filled 2015-10-03: qty 5

## 2015-10-03 MED ORDER — POTASSIUM CHLORIDE 10 MEQ/100ML IV SOLN
10.0000 meq | INTRAVENOUS | Status: AC
  Administered 2015-10-03 (×3): 10 meq via INTRAVENOUS
  Filled 2015-10-03 (×3): qty 100

## 2015-10-03 MED ORDER — MAGNESIUM SULFATE 2 GM/50ML IV SOLN
2.0000 g | Freq: Once | INTRAVENOUS | Status: AC
  Administered 2015-10-03: 2 g via INTRAVENOUS
  Filled 2015-10-03: qty 50

## 2015-10-03 NOTE — Progress Notes (Signed)
Maria Pruitt   DOB:06/13/1946   KH#:[id number]   TRV#:[id number]  Subjective: Holli is comfortable in bed; not eating much; does get up to go to BR but not otherwise ambulating. PT has signed off. Son Gaspar Bidding in room  Objective:  Filed Vitals:   10/03/15 0900 10/03/15 0951  BP: 142/76 125/90  Pulse: 95   Temp:    Resp: 20     Body mass index is 31.17 kg/(m^2).  Intake/Output Summary (Last 24 hours) at 10/03/15 1229 Last data filed at 10/02/15 1846  Gross per 24 hour  Intake     50 ml  Output      0 ml  Net     50 ml   Joannie knows me and son but does not know the hospital name or the year.  CBG (last 3)  No results for input(s): GLUCAP in the last 72 hours.   Labs:  Lab Results  Component Value Date   WBC 4.4 10/03/2015   HGB 10.1* 10/03/2015   HCT 28.5* 10/03/2015   MCV 102.5* 10/03/2015   PLT 137* 10/03/2015   NEUTROABS 2.5 10/03/2015    @LASTCHEMISTRY @  Urine Studies No results for input(s): UHGB, CRYS in the last 72 hours.  Invalid input(s): UACOL, UAPR, USPG, UPH, UTP, UGL, UKET, UBIL, UNIT, UROB, ULEU, UEPI, UWBC, URBC, UBAC, [place], [place], [place]  Basic Metabolic Panel:  Recent Labs Lab 09/27/15 0330 09/28/15 0429 09/29/15 0355 09/30/15 0325 10/01/15 0445 10/02/15 0520 10/03/15 0421  NA 132* 136 139 140 141 143 140  K 3.5 3.2* 3.0* 3.8 3.8 2.9* 3.4*  CL 100* 104 106 107 108 106 103  CO2 22 21* 19* 22 21* 25 29  GLUCOSE 101* 223* 217* 208* 134* 141* 109*  BUN 8 7 5* 5* 8 6 <5*  CREATININE 1.34* 0.94 0.89 0.66 0.56 0.62 0.44  CALCIUM 6.5* 6.5* 6.3* 6.1* 6.1* 6.6* 7.0*  MG 1.1* 1.3* 1.6*  --  1.4*  --  1.5*  PHOS 2.2*  --   --   --   --   --  1.5*   GFR Estimated Creatinine Clearance: 70.9 mL/min (by C-G formula based on Cr of 0.44). Liver Function Tests:  Recent Labs Lab 09/28/15 0429 09/29/15 0355 09/30/15 0325 10/01/15 0445 10/02/15 0520  AST 122* 153* 160* 183* 175*  ALT 72* 106* 87* 102* 102*  ALKPHOS 184* 266* 335* 419* 516*   BILITOT 4.5* 4.1* 3.8* 4.0* 4.8*  PROT 5.3* 5.6* 5.1* 5.4* 5.3*  ALBUMIN 2.4* 2.5* 2.3* 2.4* 2.4*   No results for input(s): LIPASE, AMYLASE in the last 168 hours.  Recent Labs Lab 09/29/15 0650 09/30/15 0520 10/01/15 0445 10/02/15 0505 10/03/15 0421  AMMONIA 68* 87* 99* 66* 78*   Coagulation profile  Recent Labs Lab 10/02/15 0520  INR 1.18    CBC:  Recent Labs Lab 09/29/15 0355 09/30/15 0325 10/01/15 0445 10/02/15 0520 10/03/15 0421  WBC 1.9* 1.7* 2.4* 3.4* 4.4  NEUTROABS  --   --   --   --  2.5  HGB 10.2* 9.0* 9.6* 10.2* 10.1*  HCT 28.7* 25.4* 27.6* 29.8* 28.5*  MCV 101.8* 102.8* 104.9* 104.6* 102.5*  PLT 139* 110* 122* 141* 137*   Cardiac Enzymes: No results for input(s): CKTOTAL, CKMB, CKMBINDEX, TROPONINI in the last 168 hours. BNP: Invalid input(s): POCBNP CBG: No results for input(s): GLUCAP in the last 168 hours. D-Dimer No results for input(s): DDIMER in the last 72 hours. Hgb A1c No results for input(s): HGBA1C  in the last 72 hours. Lipid Profile No results for input(s): CHOL, HDL, LDLCALC, TRIG, CHOLHDL, LDLDIRECT in the last 72 hours. Thyroid function studies No results for input(s): TSH, T4TOTAL, T3FREE, THYROIDAB in the last 72 hours.  Invalid input(s): FREET3 Anemia work up No results for input(s): VITAMINB12, FOLATE, FERRITIN, TIBC, IRON, RETICCTPCT in the last 72 hours. Microbiology Recent Results (from the past 240 hour(s))  Culture, blood (Routine X 2) w Reflex to ID Panel     Status: None   Collection Time: 09/24/15  3:25 PM  Result Value Ref Range Status   Specimen Description   Final    BLOOD PORTA CATH BOTTLES DRAWN AEROBIC AND ANAEROBIC   Special Requests 10CC  Final   Culture   Final    NO GROWTH 5 DAYS Performed at [facility]    Report Status 09/29/2015 FINAL  Final  Culture, blood (Routine X 2) w Reflex to ID Panel     Status: None   Collection Time: 09/24/15  4:00 PM  Result Value Ref Range Status    Specimen Description BLOOD BLOOD RIGHT HAND  Final   Special Requests BOTTLES DRAWN AEROBIC AND ANAEROBIC 5CC  Final   Culture   Final    NO GROWTH 5 DAYS Performed at [facility]    Report Status 09/29/2015 FINAL  Final  Urine culture     Status: None   Collection Time: 09/25/15 12:13 AM  Result Value Ref Range Status   Specimen Description URINE, CLEAN CATCH  Final   Special Requests NONE  Final   Culture   Final    NO GROWTH 1 DAY Performed at [facility]    Report Status 09/26/2015 FINAL  Final  Culture, sputum-assessment     Status: None   Collection Time: 09/25/15 11:10 PM  Result Value Ref Range Status   Specimen Description SPU  Final   Special Requests NONE  Final   Sputum evaluation   Final    MICROSCOPIC FINDINGS SUGGEST THAT THIS SPECIMEN IS NOT REPRESENTATIVE OF LOWER RESPIRATORY SECRETIONS. PLEASE RECOLLECT. [place]OL[place],B RN [id number] [place],N    Report Status 09/26/2015 FINAL  Final  Respiratory virus panel     Status: Abnormal   Collection Time: 09/26/15 12:16 PM  Result Value Ref Range Status   Respiratory Syncytial Virus A Negative Negative Final   Respiratory Syncytial Virus B Negative Negative Final   Influenza A Positive (A) Negative Final    Comment: Subtype: H3   Influenza B Negative Negative Final   Parainfluenza 1 Negative Negative Final   Parainfluenza 2 Negative Negative Final   Parainfluenza 3 Negative Negative Final   Metapneumovirus Negative Negative Final   Rhinovirus Negative Negative Final   Adenovirus Negative Negative Final    Comment: (NOTE) Performed At: [facility] [address] [place], [place] [id number] Lindon Romp MD [place]:[id number]   Nasal culture     Status: None   Collection Time: 09/26/15  4:24 PM  Result Value Ref Range Status   Specimen Description NOSE  Final   Special Requests NONE  Final   Culture   Final    NORMAL NASOPHARYNGEAL FLORA Performed at [facility]    Report Status  09/29/2015 FINAL  Final  MRSA PCR Screening     Status: None   Collection Time: 09/27/15 11:36 AM  Result Value Ref Range Status   MRSA by PCR NEGATIVE NEGATIVE Final    Comment:  The GeneXpert MRSA Assay (FDA approved for NASAL specimens only), is one component of a comprehensive MRSA colonization surveillance program. It is not intended to diagnose MRSA infection nor to guide or monitor treatment for MRSA infections.   Culture, expectorated sputum-assessment     Status: None   Collection Time: 09/27/15  4:14 PM  Result Value Ref Range Status   Specimen Description SPU  Final   Special Requests Immunocompromised  Final   Sputum evaluation   Final    MICROSCOPIC FINDINGS SUGGEST THAT THIS SPECIMEN IS NOT REPRESENTATIVE OF LOWER RESPIRATORY SECRETIONS. PLEASE RECOLLECT. [place] RN [phone] [id number] [place]    Report Status 09/27/2015 FINAL  Final      Studies:  Mr 3d Recon At Scanner  10/02/2015  CLINICAL DATA:  Metastatic breast cancer. EXAM: MRI ABDOMEN WITHOUT AND WITH CONTRAST (INCLUDING MRCP) TECHNIQUE: Multiplanar multisequence MR imaging of the abdomen was performed both before and after the administration of intravenous contrast. Heavily T2-weighted images of the biliary and pancreatic ducts were obtained, and three-dimensional MRCP images were rendered by post processing. CONTRAST:  85m MULTIHANCE GADOBENATE DIMEGLUMINE 529 MG/ML IV SOLN COMPARISON:  MRI 08/23/2015 FINDINGS: Examination is limited by breathing motion artifact. Lower chest: No obvious lung base lesions or pleural effusion. No pericardial effusion. Bilateral breast prosthesis noted. Hepatobiliary: Stable cirrhotic changes involving the liver. No early arterial phase enhancing lesions to suggest dysplastic nodule or HCC. No intrahepatic biliary dilatation. Normal caliber and course of the common bile duct. Normal gallbladder. Pancreas: No pancreatic mass, inflammation or ductal dilatation. Spleen: Normal  size.  No focal lesions. Adrenals/Urinary Tract: The left adrenal gland mass is smaller. It previously measured 43.5 x 20.5 mm and now measures 35 x 11.5 mm. 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 The right kidney demonstrates compensatory hypertrophy. No renal lesions or hydronephrosis. Stomach/Bowel: The stomach, duodenum, visualized small bowel and visualize colon are grossly normal. Vascular/Lymphatic: Persistent but improved retroperitoneal lymphadenopathy. Largest nodal mass between the atretic left kidney and the aorta measures 30.5 x 25.5 mm on series 1102, image 60. This previously measured 47 x 39 mm. The aorta and branch vessels are patent. The major venous structures are patent. Other: No ascites. Stable anterior abdominal wall hernia containing fat. Musculoskeletal: No significant change in L2 vertebral body lesion. No new osseous lesions. IMPRESSION: 1. Stable cirrhotic changes involving the liver. No worrisome hepatic lesions. 2. Interval decrease in size of the left adrenal mass and retroperitoneal lymphadenopathy. 3. Stable L2 metastatic lesion. Electronically Signed   By: PMarijo SanesM.D.   On: 10/02/2015 13:53   Mr Abd W/wo Cm/mrcp  10/02/2015  CLINICAL DATA:  Metastatic breast cancer. EXAM: MRI ABDOMEN WITHOUT AND WITH CONTRAST (INCLUDING MRCP) TECHNIQUE: Multiplanar multisequence MR imaging of the abdomen was performed both before and after the administration of intravenous contrast. Heavily T2-weighted images of the biliary and pancreatic ducts were obtained, and three-dimensional MRCP images were rendered by post processing. CONTRAST:  172mMULTIHANCE GADOBENATE DIMEGLUMINE 529 MG/ML IV SOLN COMPARISON:  MRI 08/23/2015 FINDINGS: Examination is limited by breathing motion artifact. Lower chest: No obvious lung base lesions or pleural effusion. No pericardial effusion. Bilateral breast prosthesis noted. Hepatobiliary: Stable cirrhotic changes involving the liver. No early arterial phase  enhancing lesions to suggest dysplastic nodule or HCC. No intrahepatic biliary dilatation. Normal caliber and course of the common bile duct. Normal gallbladder. Pancreas: No pancreatic mass, inflammation or ductal dilatation. Spleen: Normal size.  No focal lesions. Adrenals/Urinary Tract: The left adrenal gland mass is  smaller. It previously measured 43.5 x 20.5 mm and now measures 35 x 11.5 mm. 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 The right kidney demonstrates compensatory hypertrophy. No renal lesions or hydronephrosis. Stomach/Bowel: The stomach, duodenum, visualized small bowel and visualize colon are grossly normal. Vascular/Lymphatic: Persistent but improved retroperitoneal lymphadenopathy. Largest nodal mass between the atretic left kidney and the aorta measures 30.5 x 25.5 mm on series 1102, image 60. This previously measured 47 x 39 mm. The aorta and branch vessels are patent. The major venous structures are patent. Other: No ascites. Stable anterior abdominal wall hernia containing fat. Musculoskeletal: No significant change in L2 vertebral body lesion. No new osseous lesions. IMPRESSION: 1. Stable cirrhotic changes involving the liver. No worrisome hepatic lesions. 2. Interval decrease in size of the left adrenal mass and retroperitoneal lymphadenopathy. 3. Stable L2 metastatic lesion. Electronically Signed   By: Marijo Sanes M.D.   On: 10/02/2015 13:53    Assessment: 70 y.o. [place], [place] woman  (1) status post left breast biopsy in February of 2011 for an invasive lobular carcinoma measuring 2.9 cm by MRI, with a positive prechemotherapy axillary lymph node biopsy, strongly estrogen and progesterone receptor positive with no evidence of HER-2/neu amplification and an MIB-1 of 14%.   (2) received 6 cycles of docetaxel/ cyclophosphamide in the neoadjuvant setting, completed in mid July of 2011   (3) s/p left lumpectomy and axillary lymph node dissection in August of 2011 for a  ypT1b yTN3 (14 positive lymph nodes), Stage IIIC, grade 1 residual tumor. HER-2/neu was repeated, again not amplified.   (4) Completed loco-regional radiation in late October of 2011   (5) on letrozole starting early November 2011, continued to October 2016, with progression  METASTATIC DISEASE: September 2016: Involving left adrenal gland, regional nodes and bones  (6) biopsy of a 7.5 cm left adrenal mass 05/30/2015 showed metastatic carcinoma, estrogen, progesterone, and HER-2 negative, with an MIB-1 of 90-100%; the tumor cells were cytokeratin 7 positive, cytokeratin 20 focally positive, gross cystic disease fluid protein negative (a) the CA-27-29 is informative (b) PET scan 06/17/2015 shows, in addition to the left adrenal mass, some periaortic adenopathy and multiple hypermetabolic bone metastases (L2, T9, T2 and others) (c) EGD on 06/16/2015 to evaluate suspicious cardiac wall thickening showed no evidence of malignancy  (7) capecitabine started 07/04/2015 at 1.5 g BID 7/7. Discontinued 08/29/15 with progression  (8) denosumab/ Xgeva started 07/18/2015, repeated monthly  (9) eribulin D1/D8 q 21 days started 09/14/15  (10) cryptogenic cirrhosis with encephalopathy  PLAN:   I have reviewed the patient's scans back to March 2011 with radiology. At that time she did not have a nodular liver, but she did have a large caudate and left lobe which could be consistent, in retrospect, with early cirrhosis.  She has no evidence of cancer in the liver through multiple modalities including PET and liver MRI.  Accordingly I conclude she has cryptogenic cirrhosis. This appears to have been compensated until recently. Reviewing her LF Ts and recent chemo courses, the LFTs were unremarkable through her capecitabine chemotherapy (discontinued Mid-December) and started rising 09/20/2015 (before her first eribulin dose). This sugegsts that (while eribulin may be  contributing to the problem) the primary issue is not cancer related and likely not significantly worsened by her chemotherapy.      I have gone ahead and placed a consult to GI to request their assistance in evaluating and managing Rikita's cirrhosis.  I am encouraged that her measurable cancer is already  decreased. Would like to return to eribulin, at lower doses, hopefully next week as outpatient.  From a coagulation point of view the plan is to continue prophylactic dose lovenox as outpatient.  Will follow with you.   [name], MD 10/03/2015  12:29 PM Medical Oncology and Hematology [facility] [address] [place],  [address] Tel. [phone]    Fax. [phone]

## 2015-10-03 NOTE — Progress Notes (Signed)
I have reviewed the patient's scans back to march 2011 with radiology. At that time she did not have a nodular liver, but she did have a large caudate and left lobe which could be consistent, in retrospect, with early cirrhosis.  She has no evidence of cancer in the liver through multiple modalities including PET and liver MRI.  Accordingly I conclude she has cryptogenic cirrhosis. This appears to have been compensated until recently. Reviewing her LF Ts and recent chemo courses, the LFTs were unremarkable through her capecitabine chemotherapy (discontinued  Mid-December) and started rising 09/20/2015 (before her first eribulin dose). This sugegsts that (while eribulin may be contributing to the problem) the primary issue is not cancer related and likely not significantly worsened by her chemotherapy.  Would suggest a GI consult to assist with further workup and management.  Will continue to follow with you

## 2015-10-03 NOTE — Care Management Important Message (Signed)
Important Message  Patient DetailsIM Letter given to Kathy/Case Manager to present to Patient  Name: Maria Pruitt MRN: [id number] Date of Birth: Jun 20, 1946   Medicare Important Message Given:  Yes    Camillo Flaming 10/03/2015, 12:54 [place] Message  Patient Details  Name: Maria Pruitt MRN: [id number] Date of Birth: 07-20-1946   Medicare Important Message Given:  Yes    Camillo Flaming 10/03/2015, 12:54 PM

## 2015-10-03 NOTE — Consult Note (Signed)
Referring Provider: [facility] Primary Care Physician:  [name], PA-C Primary Gastroenterologist:  Dr. Silvano Rusk  Reason for Consultation:  Transaminitis, cirrhosis     HPI: Maria Pruitt is a 70 y.o. female  was admitted to [facility] in 09/24/2015. She has history metastatic invasive lobular breast carcinoma with metastasis to the left adrenal gland, regional nodes, and bones. She is followed by Dr. Jana Hakim. Past history significant for A. fib with RVR, sepsis, thrombocytopenia, anemia, hypertension, and stroke. She had started chemotherapy approximately 8 weeks sooner. On the 14th she was brought to the emergency room by her daughter with complaints of weakness, altered mental status nausea, or oral intake.  The patient's daughter relates a rather complex history. Apparently the patient was living in [place] for many years and 15-20 years and was told she had a fatty liver. She then moved to [place], [place] for 5 years. In February 2016, she developed a severe headache with generalized weakness and blurred vision and was seen at the [facility] near [facility]. CT showed intracerebral hemorrhage. She was transferred to [facility] where CT showed an enlargement of the intracerebral hemorrhage. She was intubated and transferred to [facility]. Daughter reports that patient had a prolonged stay there with sepsis and through that time had significant elevation of her liver enzymes which normalized as the patient was stabilized. On 09/26/2015 during this admission she had a CT angiography that revealed  subsegmental left lower lobe pulmonary embolus. She was seen in consultation by interventional radiology for consideration of placement of an IVC filter however the patient's family opted not to go with the filter and she has been on Lovenox.   In early December the patient was noted to have a total bili 1.11,  ALT 25 AST 38 alkaline phosphatase 156. After initiation of chemotherapy, her LFTs have been noted to be climbing. Most recent labs show a total bili of 4.8, ALT 102, AST 175, alkaline phosphatase 516. Ammonia has been elevated as well and the patient has had varying degrees of confusion. Her ammonia today is 78. She had an ultrasound of the abdomen on January 14 showed a mildly nodular contour of the liver raising concern for some degree of hepatic cirrhosis. No focal hepatic lesions were seen. MRCP on January 22 revealed stable cirrhotic changes involving the liver with no early arterial phase enhancing lesions to suggest dysplastic nodule or HCC. No intrahepatic biliary dilatation. Normal caliber and course of the CBD. Normal gallbladder. No pancreatic mass. Spleen is normal.  Patient denies use of alcohol, history of hepatitis, prior blood transfusions, or recreational drug use. She is not using any herbal supplements. Hepatitis panel has been negative.Patient denies any known history or family history of any autoimmune diseases. She has tested positive for influenza type day and has been on Tamiflu and Zosyn this admission    Past Medical History  Diagnosis Date  . Hypertension   . Breast cancer ([place]) 2011    Stage 3  . Atrial fibrillation ([place])   . Hypercholesterolemia   . Abnormal ECG   . Allergy     seasonal  . Arthritis   . GERD (gastroesophageal reflux disease)   . 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 Seizures ([place])   . Stroke ([place])     dec 15  . Thyroid disease     Past Surgical History  Procedure Laterality Date  .  Cesarean section  1980  . Breast enhancement surgery  1987  . Umbilical hernia repair    . Tubal ligation    . Scar revision    . Pituitary surgery      adenoma  . Rhinoplasty  1985  . Breast lumpectomy Left   . Colonoscopy      Prior to Admission medications   Medication Sig Start Date End Date Taking? Authorizing Provider    acetaminophen (TYLENOL) 500 MG tablet Take 500 mg by mouth every 6 (six) hours as needed for mild pain. Reported on 09/20/2015   Yes Historical Provider, MD  ALPRAZolam Duanne Moron) 0.25 MG tablet Take 0.25 mg by mouth 2 (two) times daily as needed for anxiety. Reported on 09/20/2015 10/21/14 10/21/15 Yes Historical Provider, MD  Ascorbic Acid (VITAMIN C) 100 MG tablet Take 1,000 mg by mouth daily.    Yes Historical Provider, MD  aspirin (ASPIRIN CHILDRENS) 81 MG chewable tablet Chew 1 tablet (81 mg total) by mouth daily. 11/04/14  Yes [name], MD  atorvastatin (LIPITOR) 40 MG tablet Take 1 tablet (40 mg total) by mouth daily. 11/04/14  Yes [name], MD  hydrALAZINE (APRESOLINE) 10 MG tablet Take 10 mg by mouth 4 (four) times daily as needed. FOR SBP>200 or DBP>110 12/09/14  Yes Historical Provider, MD  hydrochlorothiazide (HYDRODIURIL) 25 MG tablet Take 1 tablet (25 mg total) by mouth daily. 06/08/15  Yes [name], MD  levothyroxine (SYNTHROID, LEVOTHROID) 100 MCG tablet Take 88 mcg by mouth daily before breakfast. 05/15/13  Yes Peter M Martinique, MD  lidocaine-prilocaine (EMLA) cream Apply to affected area once 09/09/15  Yes Laurie Panda, NP  loratadine (CLARITIN) 10 MG tablet Take 10 mg by mouth daily.   Yes Historical Provider, MD  losartan (COZAAR) 50 MG tablet Take 50 mg by mouth daily.   Yes Historical Provider, MD  metoprolol (LOPRESSOR) 50 MG tablet Take 50 mg by mouth 2 (two) times daily.   Yes Historical Provider, MD  ondansetron (ZOFRAN) 8 MG tablet Take 1 tablet (8 mg total) by mouth 2 (two) times daily. Start the day after chemo for 2 days. Then take as needed for nausea or vomiting. 09/09/15  Yes Laurie Panda, NP  ondansetron (ZOFRAN-ODT) 8 MG disintegrating tablet Take 1 tablet (8 mg total) by mouth 2 (two) times daily as needed for nausea or vomiting. 08/29/15  Yes Laurie Panda, NP  capecitabine (XELODA) 500 MG tablet Take 3 tablets (1,500 mg total) by mouth  2 (two) times daily after a meal. Patient not taking: Reported on 09/20/2015 08/12/15   Laurie Panda, NP  famotidine (PEPCID) 20 MG tablet Take 1 tablet (20 mg total) by mouth 2 (two) times daily. Patient not taking: Reported on 09/20/2015 11/04/14   [name], MD  HYDROcodone-acetaminophen (NORCO/VICODIN) 5-325 MG tablet Take 1 tablet by mouth every 6 (six) hours as needed for moderate pain. Patient not taking: Reported on 09/09/2015 08/12/15   Laurie Panda, NP  oxyCODONE (OXY IR/ROXICODONE) 5 MG immediate release tablet Take 1 tablet (5 mg total) by mouth every 4 (four) hours as needed for severe pain. Patient not taking: Reported on 09/20/2015 09/08/15   Nicholas Lose, MD  prochlorperazine (COMPAZINE) 10 MG tablet Take 1 tablet (10 mg total) by mouth every 6 (six) hours as needed (Nausea or vomiting). Patient not taking: Reported on 09/20/2015 09/09/15   Laurie Panda, NP    Current Facility-Administered Medications  Medication Dose Route  Frequency Provider Last Rate Last Dose  . ALPRAZolam Duanne Moron) tablet 0.25 mg  0.25 mg Oral BID PRN Theodis Blaze, MD      . amoxicillin-clavulanate (AUGMENTIN) 875-125 MG per tablet 1 tablet  1 tablet Oral Q12H Nat Math, MD   1 tablet at 10/03/15 0825  . aspirin chewable tablet 81 mg  81 mg Oral Daily Simbiso Ranga, MD   81 mg at 10/03/15 0825  . calcium carbonate (TUMS - dosed in mg elemental calcium) chewable tablet 200 mg of elemental calcium  1 tablet Oral TID Simbiso Ranga, MD   200 mg of elemental calcium at 10/03/15 0825  . diphenhydrAMINE (BENADRYL) capsule 25 mg  25 mg Oral Q4H PRN Rhetta Mura Schorr, NP   25 mg at 10/01/15 1028  . enoxaparin (LOVENOX) injection 40 mg  40 mg Subcutaneous Q24H [name], MD   40 mg at 10/01/15 2203  . famotidine (PEPCID) tablet 20 mg  20 mg Oral BID Adrian Saran, RPH   20 mg at 10/03/15 0825  . feeding supplement (BOOST / RESOURCE BREEZE) liquid 1 Container  1 Container Oral TID BM Rosezetta Schlatter, RD   1 Container at 09/30/15 2127  . guaiFENesin-dextromethorphan (ROBITUSSIN DM) 100-10 MG/5ML syrup 5 mL  5 mL Oral Q4H PRN Simbiso Ranga, MD   5 mL at 10/01/15 2350  . ipratropium (ATROVENT) nebulizer solution 0.5 mg  0.5 mg Nebulization TID Florencia Reasons, MD   0.5 mg at 10/03/15 0928  . lactulose (CHRONULAC) 10 GM/15ML solution 20 g  20 g Oral BID Simbiso Ranga, MD   20 g at 10/03/15 0824  . levalbuterol (XOPENEX) nebulizer solution 0.63 mg  0.63 mg Nebulization Q4H PRN Simbiso Ranga, MD   0.63 mg at 10/01/15 1225  . levalbuterol (XOPENEX) nebulizer solution 1.25 mg  1.25 mg Nebulization TID Florencia Reasons, MD   1.25 mg at 10/03/15 0928  . levothyroxine (SYNTHROID, LEVOTHROID) tablet 88 mcg  88 mcg Oral QAC breakfast Theodis Blaze, MD   88 mcg at 10/03/15 0825  . magic mouthwash w/lidocaine  15 mL Oral QID Simbiso Ranga, MD   15 mL at 10/03/15 0824  . metoprolol (LOPRESSOR) injection 5 mg  5 mg Intravenous Q6H PRN Theodis Blaze, MD   5 mg at 10/03/15 0916  . metoprolol tartrate (LOPRESSOR) tablet 25 mg  25 mg Oral BID Simbiso Ranga, MD   25 mg at 10/03/15 0826  . morphine 2 MG/ML injection 1 mg  1 mg Intravenous Q2H PRN Theodis Blaze, MD      . multivitamin with minerals tablet 1 tablet  1 tablet Oral Daily Nat Math, MD   1 tablet at 10/02/15 2121  . ondansetron (ZOFRAN) tablet 4 mg  4 mg Oral Q6H PRN Theodis Blaze, MD       Or  . ondansetron [facility]) injection 4 mg  4 mg Intravenous Q6H PRN Theodis Blaze, MD   4 mg at 09/25/15 1722  . oxyCODONE (Oxy IR/ROXICODONE) immediate release tablet 5 mg  5 mg Oral Q3H PRN Theodis Blaze, MD   5 mg at 09/30/15 2122  . prochlorperazine (COMPAZINE) injection 10 mg  10 mg Intravenous Q6H PRN Dianne Dun, NP   10 mg at 09/25/15 1752  . sodium chloride 0.9 % injection 10-40 mL  10-40 mL Intracatheter PRN Theodis Blaze, MD   10 mL at 10/03/15 0421  . sodium chloride 0.9 % injection 10-40 mL  10-40 mL Intracatheter Q12H Theodis Blaze, MD   10 mL  at 09/29/15 0358  . sodium chloride 0.9 % injection 3 mL  3 mL Intravenous Q12H Theodis Blaze, MD   3 mL at 10/01/15 2206  . zolpidem (AMBIEN) tablet 5 mg  5 mg Oral QHS PRN Nat Math, MD       Facility-Administered Medications Ordered in Other Encounters  Medication Dose Route Frequency Provider Last Rate Last Dose  . 0.9 %  sodium chloride infusion   Intravenous Once [name], MD        Allergies as of 09/24/2015 - Review Complete 09/24/2015  Allergen Reaction Noted  . Ciprofloxacin Other (See Comments) 11/04/2014  . Clonidine derivatives Other (See Comments) 11/04/2014  . Phenytoin  11/04/2014  . Sulfa antibiotics Hives 11/04/2014  . Benzonatate  02/28/2009  . Codeine  05/15/2010    Family History  Problem Relation Age of Onset  . Heart disease Mother   . Heart attack Father   . Heart attack Brother   . Colon cancer Neg Hx     Social History   Social History  . Marital Status: Married    Spouse Name: N/A  . Number of Children: N/A  . Years of Education: N/A   Occupational History  . Nurse    Social History Main Topics  . Smoking status: Never Smoker   . Smokeless tobacco: Never Used  . Alcohol Use: No  . Drug Use: No  . Sexual Activity: Not on file   Other Topics Concern  . Not on file   Social History Narrative    Review of Systems: Gen:  he admits to weakness as  CV: Denies chest pain, angina, palpitations, syncope, orthopnea, PND, peripheral edema, and claudication. Resp: Denies dyspnea at rest, dyspnea with exercise, cough, sputum, wheezing, coughing up blood, and pleurisy. GI: Denies vomiting blood, jaundice, and fecal incontinence.   Denies dysphagia or odynophagia. admits to nausea  GU : Denies urinary burning, blood in urine, urinary frequency, urinary hesitancy, nocturnal urination, and urinary incontinence. MS: Denies joint pain, limitation of movement, and swelling, stiffness, low back pain, extremity pain. Denies muscle weakness,  cramps, atrophy.  Derm: Denies rash, itching, dry skin, hives, moles, warts, or unhealing ulcers.  Psych: has had some confusion  Heme: Denies bruising, bleeding, and enlarged lymph nodes. Neuro:  Denies any dizziness, paresthesias.   Physical Exam: Vital signs in last 24 hours: Temp:  [97.9 F (36.6 C)-98.8 F (37.1 C)] 98.8 F (37.1 C) (01/23 0655) Pulse Rate:  [80-95] 95 (01/23 0900) Resp:  [18-20] 20 (01/23 0900) BP: (125-157)/(71-92) 125/90 mmHg (01/23 0951) SpO2:  [95 %-98 %] 97 % (01/23 0928) Weight:  [181 lb 10.5 oz (82.4 kg)] 181 lb 10.5 oz (82.4 kg) (01/23 0655) Last BM Date: 10/03/15 General:   Alert,  chronically ill-appearing female in no apparent distress , mildly jaundiced  Head:  Normocephalic and atraumatic. Eyes:  Sclera anicteric,    Conjunctiva pink. Ears:  Normal auditory acuity. Nose:  No deformity, discharge,  or lesions. Mouth:  No deformity or lesions.   Neck:  Supple; no masses or thyromegaly. Lungs:  Clear throughout to auscultation.    Heart:  Regular rate and rhythm; no murmurs, clicks, rubs,  or gallops. Abdomen:  Soft,nontender, BS active,nonpalp mass or hsm.   Rectal:  Deferred  Msk:  Symmetrical without gross deformities. . Pulses:  Normal pulses noted. Extremities:  Without clubbing or edema. Neurologic: Alert and  oriented  Skin: Intact without significant lesions or rashes.. Psych:  Alert and cooperative. Normal mood and affect.  Intake/Output from previous day: 01/22 0701 - 01/23 0700 In: 50 [IV Piggyback:50] Out: -  Intake/Output this shift:    Lab Results:  Recent Labs  10/01/15 0445 10/02/15 0520 10/03/15 0421  WBC 2.4* 3.4* 4.4  HGB 9.6* 10.2* 10.1*  HCT 27.6* 29.8* 28.5*  PLT 122* 141* 137*   BMET  Recent Labs  10/01/15 0445 10/02/15 0520 10/03/15 0421  NA 141 143 140  K 3.8 2.9* 3.4*  CL 108 106 103  CO2 21* 25 29  GLUCOSE 134* 141* 109*  BUN 8 6 <5*  CREATININE 0.56 0.62 0.44  CALCIUM 6.1* 6.6* 7.0*    LFT  Recent Labs  10/02/15 0520  PROT 5.3*  ALBUMIN 2.4*  AST 175*  ALT 102*  ALKPHOS 516*  BILITOT 4.8*  BILIDIR 2.8*  IBILI 2.0*   PT/INR  Recent Labs  10/02/15 0520  LABPROT 15.1  INR 1.18    Studies/Results: Mr 3d Recon At Scanner  10/02/2015  CLINICAL DATA:  Metastatic breast cancer. EXAM: MRI ABDOMEN WITHOUT AND WITH CONTRAST (INCLUDING MRCP) TECHNIQUE: Multiplanar multisequence MR imaging of the abdomen was performed both before and after the administration of intravenous contrast. Heavily T2-weighted images of the biliary and pancreatic ducts were obtained, and three-dimensional MRCP images were rendered by post processing. CONTRAST:  75mL MULTIHANCE GADOBENATE DIMEGLUMINE 529 MG/ML IV SOLN COMPARISON:  MRI 08/23/2015 FINDINGS: Examination is limited by breathing motion artifact. Lower chest: No obvious lung base lesions or pleural effusion. No pericardial effusion. Bilateral breast prosthesis noted. Hepatobiliary: Stable cirrhotic changes involving the liver. No early arterial phase enhancing lesions to suggest dysplastic nodule or HCC. No intrahepatic biliary dilatation. Normal caliber and course of the common bile duct. Normal gallbladder. Pancreas: No pancreatic mass, inflammation or ductal dilatation. Spleen: Normal size.  No focal lesions. Adrenals/Urinary Tract: The left adrenal gland mass is smaller. It previously measured 43.5 x 20.5 mm and now measures 35 x 11.5 mm. 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 The right kidney demonstrates compensatory hypertrophy. No renal lesions or hydronephrosis. Stomach/Bowel: The stomach, duodenum, visualized small bowel and visualize colon are grossly normal. Vascular/Lymphatic: Persistent but improved retroperitoneal lymphadenopathy. Largest nodal mass between the atretic left kidney and the aorta measures 30.5 x 25.5 mm on series 1102, image 60. This previously measured 47 x 39 mm. The aorta and branch vessels are patent. The major  venous structures are patent. Other: No ascites. Stable anterior abdominal wall hernia containing fat. Musculoskeletal: No significant change in L2 vertebral body lesion. No new osseous lesions. IMPRESSION: 1. Stable cirrhotic changes involving the liver. No worrisome hepatic lesions. 2. Interval decrease in size of the left adrenal mass and retroperitoneal lymphadenopathy. 3. Stable L2 metastatic lesion. Electronically Signed   By: Marijo Sanes M.D.   On: 10/02/2015 13:53   Mr Abd W/wo Cm/mrcp  10/02/2015  CLINICAL DATA:  Metastatic breast cancer. EXAM: MRI ABDOMEN WITHOUT AND WITH CONTRAST (INCLUDING MRCP) TECHNIQUE: Multiplanar multisequence MR imaging of the abdomen was performed both before and after the administration of intravenous contrast. Heavily T2-weighted images of the biliary and pancreatic ducts were obtained, and three-dimensional MRCP images were rendered by post processing. CONTRAST:  68mL MULTIHANCE GADOBENATE DIMEGLUMINE 529 MG/ML IV SOLN COMPARISON:  MRI 08/23/2015 FINDINGS: Examination is limited by breathing motion artifact. Lower chest: No obvious lung base lesions or pleural effusion. No pericardial effusion. Bilateral breast prosthesis noted. Hepatobiliary: Stable cirrhotic  changes involving the liver. No early arterial phase enhancing lesions to suggest dysplastic nodule or HCC. No intrahepatic biliary dilatation. Normal caliber and course of the common bile duct. Normal gallbladder. Pancreas: No pancreatic mass, inflammation or ductal dilatation. Spleen: Normal size.  No focal lesions. Adrenals/Urinary Tract: The left adrenal gland mass is smaller. It previously measured 43.5 x 20.5 mm and now measures 35 x 11.5 mm. 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 The right kidney demonstrates compensatory hypertrophy. No renal lesions or hydronephrosis. Stomach/Bowel: The stomach, duodenum, visualized small bowel and visualize colon are grossly normal. Vascular/Lymphatic: Persistent but improved  retroperitoneal lymphadenopathy. Largest nodal mass between the atretic left kidney and the aorta measures 30.5 x 25.5 mm on series 1102, image 60. This previously measured 47 x 39 mm. The aorta and branch vessels are patent. The major venous structures are patent. Other: No ascites. Stable anterior abdominal wall hernia containing fat. Musculoskeletal: No significant change in L2 vertebral body lesion. No new osseous lesions. IMPRESSION: 1. Stable cirrhotic changes involving the liver. No worrisome hepatic lesions. 2. Interval decrease in size of the left adrenal mass and retroperitoneal lymphadenopathy. 3. Stable L2 metastatic lesion. Electronically Signed   By: Marijo Sanes M.D.   On: 10/02/2015 13:53    IMPRESSION/PLAN:   70 year old female with a history of invasive lobular carcinoma with metastasis to the left adrenal gland, regional lymph nodes, and bone. Estrogen, progesterone, HAR-2 negative. PET scan October 2016 showed some periaortic adenopathy and multiple hypermetabolic bone metastases. She had an EGD by Dr. Carlean Purl in October 2016 to evaluate suspicious cardiac wall thickening showing no evidence of malignancy. She has had PET scan in November MRI with no evidence of metastasis to the liver. She has been noted to have a significant increase in her LFTs this admission. She does have a long-standing history of fatty liver which may account for her cirrhosis, however we will obtain further blood work to evaluate for other etiologies. We'll also obtain an ultrasound with elastography. LFTs started rising in early January --? chemotherapy may be exacerbating this. Will review with attending as to further recommendations. Will add Xifaxan to try to bring ammonia down and increase lactulose as well.   Hvozdovic, Deloris Ping 10/03/2015,  Pager [phone]  Mon-Fri 8a-5p [phone] after 5p, weekends, holidays     Attending physician's note   I have taken a history, examined the patient and reviewed  the chart. I agree with the Advanced Practitioner's note, impression and recommendations. She has a history of fatty liver dating back 15-20 years and elevated liver enzymes dating back several years that have recent worsened. She has a nodular liver on recent imaging studies however this finding was not noted on CT from 11/2009. EGD in 06/2015 did not show varices or portal gastropathy. R/O early cirrhosis secondary to NASH or other chronic liver disease. Recent worsening of LFTs related to underlying liver disease or medications possibly chemotherapy. Standard hepatic serologies and obtain US with elastography.   Lucio Edward, MD Marval Regal [phone] Mon-Fri 8a-5p [phone] after 5p, weekends, holidays

## 2015-10-04 DIAGNOSIS — R7989 Other specified abnormal findings of blood chemistry: Secondary | ICD-10-CM

## 2015-10-04 DIAGNOSIS — G9340 Encephalopathy, unspecified: Secondary | ICD-10-CM

## 2015-10-04 LAB — AMMONIA: Ammonia: 74 umol/L — ABNORMAL HIGH (ref 9–35)

## 2015-10-04 LAB — HEPATIC FUNCTION PANEL
ALBUMIN: 2.3 g/dL — AB (ref 3.5–5.0)
ALT: 90 U/L — ABNORMAL HIGH (ref 14–54)
AST: 153 U/L — AB (ref 15–41)
Alkaline Phosphatase: 502 U/L — ABNORMAL HIGH (ref 38–126)
BILIRUBIN DIRECT: 2.5 mg/dL — AB (ref 0.1–0.5)
Indirect Bilirubin: 2.1 mg/dL — ABNORMAL HIGH (ref 0.3–0.9)
Total Bilirubin: 4.6 mg/dL — ABNORMAL HIGH (ref 0.3–1.2)
Total Protein: 5.4 g/dL — ABNORMAL LOW (ref 6.5–8.1)

## 2015-10-04 LAB — AFP TUMOR MARKER: AFP TUMOR MARKER: 5.5 ng/mL (ref 0.0–8.3)

## 2015-10-04 LAB — CERULOPLASMIN: Ceruloplasmin: 30.5 mg/dL (ref 19.0–39.0)

## 2015-10-04 LAB — PROTIME-INR
INR: 1.14 (ref 0.00–1.49)
Prothrombin Time: 14.7 seconds (ref 11.6–15.2)

## 2015-10-04 LAB — MITOCHONDRIAL ANTIBODIES: MITOCHONDRIAL M2 AB, IGG: 3.5 U (ref 0.0–20.0)

## 2015-10-04 LAB — ANTI-MICROSOMAL ANTIBODY LIVER / KIDNEY: LKM1 AB: 1.4 U (ref 0.0–20.0)

## 2015-10-04 LAB — ANTI-SMOOTH MUSCLE ANTIBODY, IGG: F-Actin IgG: 16 Units (ref 0–19)

## 2015-10-04 MED ORDER — RIFAXIMIN 550 MG PO TABS: 550.0000 mg | ORAL_TABLET | Freq: Two times a day (BID) | ORAL | Status: DC

## 2015-10-04 MED ORDER — LACTULOSE 10 GM/15ML PO SOLN: 30.0000 g | Freq: Two times a day (BID) | ORAL | Status: DC

## 2015-10-04 MED ORDER — ENOXAPARIN SODIUM 40 MG/0.4ML ~~LOC~~ SOLN: 40.0000 mg | SUBCUTANEOUS | Status: DC

## 2015-10-04 MED ORDER — ADULT MULTIVITAMIN W/MINERALS CH: 1.0000 | ORAL_TABLET | Freq: Every day | ORAL | Status: DC

## 2015-10-04 MED ORDER — MAGIC MOUTHWASH W/LIDOCAINE: 15.0000 mL | Freq: Four times a day (QID) | ORAL | Status: DC

## 2015-10-04 MED ORDER — POTASSIUM CHLORIDE ER 20 MEQ PO TBCR: 20.0000 meq | EXTENDED_RELEASE_TABLET | Freq: Every day | ORAL | Status: DC

## 2015-10-04 MED ORDER — CALCIUM CARBONATE ANTACID 500 MG PO CHEW: 1.0000 | CHEWABLE_TABLET | Freq: Three times a day (TID) | ORAL | Status: DC

## 2015-10-04 MED ORDER — HEPARIN SOD (PORK) LOCK FLUSH 100 UNIT/ML IV SOLN
500.0000 [IU] | INTRAVENOUS | Status: AC | PRN
  Administered 2015-10-04: 500 [IU]

## 2015-10-04 NOTE — Progress Notes (Signed)
Maria Pruitt [id number] DOB: [birth year] DOA: 09/24/2015 PCP: Maria Nova, PA-C Assessment at time of admission on 09/24/15 Patient is 70 year old female with invasive lobular breast carcinoma metastatic to left adrenal gland, regional nodes and bones, follows with Dr. Jana Pruitt, patient started treatment 8 weeks prior to this admission currently on daily oral chemotherapy, started IV chemotherapy 2 weeks prior to this admission and gets these treatments every 2 weeks. She now presents to [facility] long emergency department with several days duration of progressively worsening weakness, nausea and nonbloody vomiting, poor oral intake and fatigue. Patient also reports subjective fevers and chills, cough mixed nonproductive and productive clear sputum. Patient also reports intermittent chest discomfort that occurs with coughing spells. Patient denies any specific sick contacts or exposures. She reports abdominal discomfort that occurs with vomiting episodes. Last chemotherapy on Tuesday, 4 days prior to this admission.  In emergency department, patient noted to be in mild distress due to discomfort, vital signs notable for T1 100.7, heart rate up to 110, blood work notable for WBC 1.2, hemoglobin 11, platelets 182, sodium 131, potassium 2.6, creatinine 1.51. Chest x-ray worrisome for developing pneumonia. TRH asked to admit for further evaluation and management of sepsis secondary to pneumonia with underlying febrile neutropenia. Summary&Daily Progress Notes since admission 09/28/15: I have seen and examined Maria Pruitt at bedside in the presence of her son and a family friend and reviewed her chart. Appreciate oncology. Maria Pruitt is a pleasant 70 year old female with invasive lobular breast carcinoma metastatic to left adrenal gland, regional nodes and bones,(followed by Dr. Jana Pruitt, and she was started treatment 8 weeks prior to this admission), atrial fibrillation not anticoagulated because  of intracranial bleed, and she presented to [facility] long emergency department with several days duration of progressively worsening weakness, nausea and nonbloody vomiting, poor oral intake and fatigue apparently resulting from Influenza Type A with concern for sepsis/pneumonia. Incidentally, she had CT chest that showed left lower lobe pulmonary embolism whose chronicity is not clear at this point. She also has electrolyte imbalances likely related to poor oral intake(hypomagnesemia/hypokalemia). Septic workup remains negative except for influenza. She has transaminitis/hyperammonemia, and it is not clear why she has transaminitis-?sepsis, other causes. She seems to be making progress including fever subsiding but she has mucositis likely related to chemotherapy. She would rather we wait on IVC filter placement while she decides whether to proceed with it, understanding the risk of throwing new clots. Will discontinue vancomycin as septic workup unremarkable so far, continue Zosyn/Tamiflu to complete course of antibiotics, change fluids to D5W normal saline with KCl, and continue to replenish electrolytes as necessary. Meanwhile, will give her Magic mouthwash instead of nystatin for mucositis. Will monitor liver enzymes and consider abdominal imaging if not improving in the next 24-48 hours. We'll follow oncology recommendations. 09/29/15: Appreciate oncology. Noted discussions regarding IVC filter placement/anticoagulation. Electrolytes remain off. Septic workup remains negative. It is unlikely that there was a bacterial element to patient's acute presentation. We'll therefore discontinue Zosyn, continue Tamiflu to complete 5 day course, replenish electrolytes, and defer anticoagulation decision to oncology. Patient continues to refuse lactulose even though ammonia level higher. Liver enzymes also higher. 09/30/15: Drowsy. Ammonia level up. Has flapping tremor. Low grade fever since Zosyn stopped. Encouraged  patient to take lactulose. Maybe aspirating as wet cough.Will add Augmentin, continue Tamiflu, and lactulose. Continue to replenish electrolytes 10/01/15: Liver enzymes going up, so is the ammonia level. Patient sleepy. Will obtain MRCP to evaluate increasing LFTs and increase lactulose frequency to  bid, and continue Tamiflu. 10/02/15: MRCP shows "1. Stable cirrhotic changes involving the liver. No worrisome hepatic lesions. 2. Interval decrease in size of the left adrenal mass and retroperitoneal lymphadenopathy. 3. Stable L2 metastatic lesion", transaminases not improving but ammonia at least better and patient's sensorium seems to be clear up. She has finished course of Tamiflu. She has very poor appetite. Will add multivitamin, continue Augmentin to complete course, and continue lactulose. 10/03/15: Some improvement in appetite. LFTs increasing. Agree with GI assessment. Ammonia level 78. Cough better. Will continue lactulose, follow GI recommendations, continue Augmentin and follow oncology recommendations. Problem List Plan  Principal Problem:   Sepsis ([place]) Active Problems:   Pyrexia   PE (pulmonary embolism)   Transaminitis   Neutropenic fever (HCC)   Influenza A   Disorder of electrolytes, low Mg, K, Ca, Phosp   Atrial fibrillation with RVR, CHADS 2-3  (HCC)   Breast cancer of upper-outer quadrant of left female breast (HCC)   Breast cancer metastasized to bone (HCC)   Thrombocytopenia (HCC)   Anemia of chronic disease   Acute kidney injury ([place])   Mucositis oral   Continue current management including Augmentin/lactulose/electrolyte replacement as necessary   Follow GI/oncology recommendations   Code Status: Full Code Family Communication: Son and family friend at bedside Disposition Plan: Home early this week.   Consultants:  Oncology  Procedures:  None  Antibiotics:  Vancomycin 09/26/2015>09/28/2015  Zosyn 09/26/2015>09/29/15  Tamiflu  09/26/2015>10/01/15  Augmentin 09/30/15>  HPI/Subjective: Tired, no appetite.  Objective: Filed Vitals:   10/04/15 0458 10/04/15 1454  BP: 146/89 118/77  Pulse: 79 86  Temp: 98.1 F (36.7 C)   Resp: 20 18    Intake/Output Summary (Last 24 hours) at 10/04/15 1853 Last data filed at 10/04/15 1500  Gross per 24 hour  Intake    935 ml  Output      0 ml  Net    935 ml   Filed Weights   10/02/15 0410 10/03/15 0655 10/04/15 0458  Weight: 83 kg (182 lb 15.7 oz) 82.4 kg (181 lb 10.5 oz) 83 kg (182 lb 15.7 oz)    Exam:   General:  Comfortable at rest.  Cardiovascular: S1-S2 normal. No murmurs. Pulse regular.  Respiratory: Good air entry bilaterally. No rhonchi or rales.  Abdomen: Soft and nontender. Normal bowel sounds. No organomegaly.  Musculoskeletal: No pedal edema   Neurological: Intact  Data Reviewed: Basic Metabolic Panel:  Recent Labs Lab 09/28/15 0429 09/29/15 0355 09/30/15 0325 10/01/15 0445 10/02/15 0520 10/03/15 0421  NA 136 139 140 141 143 140  K 3.2* 3.0* 3.8 3.8 2.9* 3.4*  CL 104 106 107 108 106 103  CO2 21* 19* 22 21* 25 29  GLUCOSE 223* 217* 208* 134* 141* 109*  BUN 7 5* 5* 8 6 <5*  CREATININE 0.94 0.89 0.66 0.56 0.62 0.44  CALCIUM 6.5* 6.3* 6.1* 6.1* 6.6* 7.0*  MG 1.3* 1.6*  --  1.4*  --  1.5*  PHOS  --   --   --   --   --  1.5*   Liver Function Tests:  Recent Labs Lab 09/29/15 0355 09/30/15 0325 10/01/15 0445 10/02/15 0520   AST 153* 160* 183* 175*   ALT 106* 87* 102* 102*   ALKPHOS 266* 335* 419* 516*   BILITOT 4.1* 3.8* 4.0* 4.8*   PROT 5.6* 5.1* 5.4* 5.3*   ALBUMIN 2.5* 2.3* 2.4* 2.4*    No results for input(s): LIPASE, AMYLASE in the last 168  hours.  Recent Labs Lab 09/30/15 0520 10/01/15 0445 10/02/15 0505 10/03/15 0421   AMMONIA 87* 99* 66* 78*    CBC:  Recent Labs Lab 09/29/15 0355 09/30/15 0325 10/01/15 0445 10/02/15 0520 10/03/15 0421  WBC 1.9* 1.7* 2.4* 3.4* 4.4  NEUTROABS  --   --   --   --  2.5   HGB 10.2* 9.0* 9.6* 10.2* 10.1*  HCT 28.7* 25.4* 27.6* 29.8* 28.5*  MCV 101.8* 102.8* 104.9* 104.6* 102.5*  PLT 139* 110* 122* 141* 137*   Cardiac Enzymes: No results for input(s): CKTOTAL, CKMB, CKMBINDEX, TROPONINI in the last 168 hours. BNP (last 3 results) No results for input(s): BNP in the last 8760 hours.  ProBNP (last 3 results) No results for input(s): PROBNP in the last 8760 hours.  CBG: No results for input(s): GLUCAP in the last 168 hours.  Recent Results (from the past 240 hour(s))  Urine culture     Status: None   Collection Time: 09/25/15 12:13 AM  Result Value Ref Range Status   Specimen Description URINE, CLEAN CATCH  Final   Special Requests NONE  Final   Culture   Final    NO GROWTH 1 DAY Performed at [facility]    Report Status 09/26/2015 FINAL  Final  Culture, sputum-assessment     Status: None   Collection Time: 09/25/15 11:10 PM  Result Value Ref Range Status   Specimen Description SPU  Final   Special Requests NONE  Final   Sputum evaluation   Final    MICROSCOPIC FINDINGS SUGGEST THAT THIS SPECIMEN IS NOT REPRESENTATIVE OF LOWER RESPIRATORY SECRETIONS. PLEASE RECOLLECT. HOLT,B RN [id number] C[place],N    Report Status 09/26/2015 FINAL  Final  Respiratory virus panel     Status: Abnormal   Collection Time: 09/26/15 12:16 PM  Result Value Ref Range Status   Respiratory Syncytial Virus A Negative Negative Final   Respiratory Syncytial Virus B Negative Negative Final   Influenza A Positive (A) Negative Final    Comment: Subtype: H3   Influenza B Negative Negative Final   Parainfluenza 1 Negative Negative Final   Parainfluenza 2 Negative Negative Final   Parainfluenza 3 Negative Negative Final   Metapneumovirus Negative Negative Final   Rhinovirus Negative Negative Final   Adenovirus Negative Negative Final    Comment: (NOTE) Performed At: [facility] [address] [place], [place] [id number] Lindon Romp MD  [id number]   Nasal culture     Status: None   Collection Time: 09/26/15  4:24 PM  Result Value Ref Range Status   Specimen Description NOSE  Final   Special Requests NONE  Final   Culture   Final    NORMAL NASOPHARYNGEAL FLORA Performed at [facility]    Report Status 09/29/2015 FINAL  Final  MRSA PCR Screening     Status: None   Collection Time: 09/27/15 11:36 AM  Result Value Ref Range Status   MRSA by PCR NEGATIVE NEGATIVE Final    Comment:        The GeneXpert MRSA Assay (FDA approved for NASAL specimens only), is one component of a comprehensive MRSA colonization surveillance program. It is not intended to diagnose MRSA infection nor to guide or monitor treatment for MRSA infections.   Culture, expectorated sputum-assessment     Status: None   Collection Time: 09/27/15  4:14 PM  Result Value Ref Range Status   Specimen Description SPU  Final   Special Requests Immunocompromised  Final  Sputum evaluation   Final    MICROSCOPIC FINDINGS SUGGEST THAT THIS SPECIMEN IS NOT REPRESENTATIVE OF LOWER RESPIRATORY SECRETIONS. PLEASE RECOLLECT. [place] RN [id number] COV[place],N    Report Status 09/27/2015 FINAL  Final     Studies: No results found.  Scheduled Meds: . amoxicillin-clavulanate  1 tablet Oral Q12H  . aspirin  81 mg Oral Daily  . calcium carbonate  1 tablet Oral TID  . enoxaparin (LOVENOX) injection  40 mg Subcutaneous Q24H  . famotidine  20 mg Oral BID  . ipratropium  0.5 mg Nebulization TID  . lactulose  30 g Oral TID  . levalbuterol  1.25 mg Nebulization TID  . levothyroxine  88 mcg Oral QAC breakfast  . magic mouthwash w/lidocaine  15 mL Oral QID  . metoprolol  25 mg Oral BID  . multivitamin with minerals  1 tablet Oral Daily  . rifaximin  550 mg Oral q12n4p  . sodium chloride  10-40 mL Intracatheter Q12H  . sodium chloride  3 mL Intravenous Q12H   Continuous Infusions:    Time spent: 25 minutes    Nylene Inlow  [facility] Pager [phone]. If 7PM-7AM, please contact night-coverage at [email or web address], password [facility] 10/04/2015, 6:53 PM  LOS: 10 days

## 2015-10-04 NOTE — Discharge Summary (Signed)
Maria Pruitt, is a 70 y.o. female  DOB [birth year]  MRN [id number].  Admission date:  09/24/2015  Admitting Physician  Theodis Blaze, MD  Discharge Date:  10/04/2015   Primary MD  [name], PA-C  Recommendations for primary care physician for things to follow:  Follow LFTs, ammonia level, potassium level, avoid statins as possible.   Admission Diagnosis   Transaminitis [R74.0] Community acquired pneumonia [J18.9] Other neutropenia ([place]) [D70.8] Fever, unspecified fever cause [R50.9]   Discharge Diagnosis  Transaminitis [R74.0] Community acquired pneumonia [J18.9] Other neutropenia ([place]) [D70.8] Fever, unspecified fever cause [R50.9]   Principal Problem:   Sepsis ([place]) Active Problems:   Pyrexia   PE (pulmonary embolism)   Transaminitis   Neutropenic fever (H[facility])   Influenza A   Disorder of electrolytes, low Mg, K, Ca, Phosp   Atrial fibrillation with RVR, CHADS 2-3  ([place])   Breast cancer of upper-outer quadrant of left female breast ([place])   Breast cancer metastasized to bone (HCC)   Thrombocytopenia ([place])   Anemia of chronic disease   Acute kidney injury ([place])   Mucositis oral      Hospital Course  Maria Pruitt is a very pleasant 70 year old female with breast CA, metastatic on chemotherapy under the care of Dr. Jana Hakim, paroxysmal atrial fibrillation/pancytopenia related to chemotherapy who came in with generalized weakness/nausea/nonbloody vomiting/subjective fevers/chills/cough and she was found to have Influenza Type A with concern for pneumonia/sepsis. However septic workup was negative other than for the flu. She completed a course of Tamiflu and empiric antibiotics for pneumonia prior to discharge. Her hospital course was significant for slow improvement, complicated by  soreness in the mouth/electrolyte imbalances/transaminitis prompting abdominal imaging including MRCP/hepatic encephalopathy/finding of pulmonary embolism. She was seen by oncology/GI who helped with her management. She declined IVC filter placement and will be on Lovenox for now. Concern was the fact that she has had intracranial bleed in the past therefore Lovenox dose prophylactic. Anticoagulation will be further addressed by oncology. She was seen by gastroenterology for transaminitis and has workup pending. This can be completed in the outpatient setting per GI. Patient on lactulose and rifaximin. She should avoid statins/Tylenol as possible in view of worsening transaminitis. She feels better and requests discharge. I have discussed discharge plan with patient's son Maria Pruitt at the bedside. Please refer to the daily progress notes below for details of patient's hospital stay including discussions regarding goals of care and imaging studies. Assessment at time of admission on 09/24/15 Patient is 70 year old female with invasive lobular breast carcinoma metastatic to left adrenal gland, regional nodes and bones, follows with Dr. Jana Hakim, patient started treatment 8 weeks prior to this admission currently on daily oral chemotherapy, started IV chemotherapy 2 weeks prior to this admission and gets these treatments every 2 weeks. She now presents to [facility] long emergency department with several days duration of progressively worsening weakness, nausea and nonbloody vomiting, poor oral intake and fatigue. Patient also reports subjective fevers and chills, cough mixed nonproductive and productive clear  sputum. Patient also reports intermittent chest discomfort that occurs with coughing spells. Patient denies any specific sick contacts or exposures. She reports abdominal discomfort that occurs with vomiting episodes. Last chemotherapy on Tuesday, 4 days prior to this admission.  In emergency department, patient noted  to be in mild distress due to discomfort, vital signs notable for T1 100.7, heart rate up to 110, blood work notable for WBC 1.2, hemoglobin 11, platelets 182, sodium 131, potassium 2.6, creatinine 1.51. Chest x-ray worrisome for developing pneumonia. TRH asked to admit for further evaluation and management of sepsis secondary to pneumonia with underlying febrile neutropenia. Summary&Daily Progress Notes since admission 09/28/15: I have seen and examined Maria Pruitt at bedside in the presence of her son and a family friend and reviewed her chart. Appreciate oncology. Maria Pruitt is a pleasant 70 year old female with invasive lobular breast carcinoma metastatic to left adrenal gland, regional nodes and bones,(followed by Dr. Jana Hakim, and she was started treatment 8 weeks prior to this admission), atrial fibrillation not anticoagulated because of intracranial bleed, and she presented to [facility] long emergency department with several days duration of progressively worsening weakness, nausea and nonbloody vomiting, poor oral intake and fatigue apparently resulting from Influenza Type A with concern for sepsis/pneumonia. Incidentally, she had CT chest that showed left lower lobe pulmonary embolism whose chronicity is not clear at this point. She also has electrolyte imbalances likely related to poor oral intake(hypomagnesemia/hypokalemia). Septic workup remains negative except for influenza. She has transaminitis/hyperammonemia, and it is not clear why she has transaminitis-?sepsis, other causes. She seems to be making progress including fever subsiding but she has mucositis likely related to chemotherapy. She would rather we wait on IVC filter placement while she decides whether to proceed with it, understanding the risk of throwing new clots. Will discontinue vancomycin as septic workup unremarkable so far, continue Zosyn/Tamiflu to complete course of antibiotics, change fluids to D5W normal saline with KCl,  and continue to replenish electrolytes as necessary. Meanwhile, will give her Magic mouthwash instead of nystatin for mucositis. Will monitor liver enzymes and consider abdominal imaging if not improving in the next 24-48 hours. We'll follow oncology recommendations. 09/29/15: Appreciate oncology. Noted discussions regarding IVC filter placement/anticoagulation. Electrolytes remain off. Septic workup remains negative. It is unlikely that there was a bacterial element to patient's acute presentation. We'll therefore discontinue Zosyn, continue Tamiflu to complete 5 day course, replenish electrolytes, and defer anticoagulation decision to oncology. Patient continues to refuse lactulose even though ammonia level higher. Liver enzymes also higher. 09/30/15: Drowsy. Ammonia level up. Has flapping tremor. Low grade fever since Zosyn stopped. Encouraged patient to take lactulose. Maybe aspirating as wet cough.Will add Augmentin, continue Tamiflu, and lactulose. Continue to replenish electrolytes 10/01/15: Liver enzymes going up, so is the ammonia level. Patient sleepy. Will obtain MRCP to evaluate increasing LFTs and increase lactulose frequency to bid, and continue Tamiflu. 10/02/15: MRCP shows "1. Stable cirrhotic changes involving the liver. No worrisome hepatic lesions. 2. Interval decrease in size of the left adrenal mass and retroperitoneal lymphadenopathy. 3. Stable L2 metastatic lesion", transaminases not improving but ammonia at least better and patient's sensorium seems to be clear up. She has finished course of Tamiflu. She has very poor appetite. Will add multivitamin, continue Augmentin to complete course, and continue lactulose. 10/03/15: Some improvement in appetite. LFTs increasing. Agree with GI assessment. Ammonia level 78. Cough better. Will continue lactulose, follow GI recommendations, continue Augmentin and follow oncology recommendations. 10/04/15: Many bowel movements last night. Feels better.  Eating better. Eager to go home.  Discharge Condition Stable.  Consults obtained  Oncology GI  Follow UP Oncology GI   Discharge Instructions  and  Discharge Medications  Discharge Instructions    Diet - low sodium heart healthy    Complete by:  As directed      Discharge instructions    Complete by:  As directed   Follow Oncology/GI outpatient     Increase activity slowly    Complete by:  As directed             Medication List    STOP taking these medications        acetaminophen 500 MG tablet  Commonly known as:  TYLENOL     atorvastatin 40 MG tablet  Commonly known as:  LIPITOR     capecitabine 500 MG tablet  Commonly known as:  XELODA     HYDROcodone-acetaminophen 5-325 MG tablet  Commonly known as:  NORCO/VICODIN      TAKE these medications        aspirin 81 MG chewable tablet  Commonly known as:  ASPIRIN CHILDRENS  Chew 1 tablet (81 mg total) by mouth daily.     calcium carbonate 500 MG chewable tablet  Commonly known as:  TUMS - dosed in mg elemental calcium  Chew 1 tablet (200 mg of elemental calcium total) by mouth 3 (three) times daily.     enoxaparin 40 MG/0.4ML injection  Commonly known as:  LOVENOX  Inject 0.4 mLs (40 mg total) into the skin daily.     famotidine 20 MG tablet  Commonly known as:  PEPCID  Take 1 tablet (20 mg total) by mouth 2 (two) times daily.     hydrALAZINE 10 MG tablet  Commonly known as:  APRESOLINE  Take 10 mg by mouth 4 (four) times daily as needed. FOR SBP>200 or DBP>110     hydrochlorothiazide 25 MG tablet  Commonly known as:  HYDRODIURIL  Take 1 tablet (25 mg total) by mouth daily.     lactulose 10 GM/15ML solution  Commonly known as:  CHRONULAC  Take 45 mLs (30 g total) by mouth 2 (two) times daily.     levothyroxine 100 MCG tablet  Commonly known as:  SYNTHROID, LEVOTHROID  Take 88 mcg by mouth daily before breakfast.     lidocaine-prilocaine cream  Commonly known as:  EMLA  Apply to affected  area once     loratadine 10 MG tablet  Commonly known as:  CLARITIN  Take 10 mg by mouth daily.     losartan 50 MG tablet  Commonly known as:  COZAAR  Take 50 mg by mouth daily.     magic mouthwash w/lidocaine Soln  Take 15 mLs by mouth 4 (four) times daily.     metoprolol 50 MG tablet  Commonly known as:  LOPRESSOR  Take 50 mg by mouth 2 (two) times daily.     multivitamin with minerals Tabs tablet  Take 1 tablet by mouth daily.     ondansetron 8 MG disintegrating tablet  Commonly known as:  ZOFRAN-ODT  Take 1 tablet (8 mg total) by mouth 2 (two) times daily as needed for nausea or vomiting.     ondansetron 8 MG tablet  Commonly known as:  ZOFRAN  Take 1 tablet (8 mg total) by mouth 2 (two) times daily. Start the day after chemo for 2 days. Then take as needed for nausea or vomiting.     oxyCODONE 5 MG  immediate release tablet  Commonly known as:  Oxy IR/ROXICODONE  Take 1 tablet (5 mg total) by mouth every 4 (four) hours as needed for severe pain.     Potassium Chloride ER 20 MEQ Tbcr  Take 20 mEq by mouth daily.     prochlorperazine 10 MG tablet  Commonly known as:  COMPAZINE  Take 1 tablet (10 mg total) by mouth every 6 (six) hours as needed (Nausea or vomiting).     rifaximin 550 MG Tabs tablet  Commonly known as:  XIFAXAN  Take 1 tablet (550 mg total) by mouth 2 times daily at 12 noon and 4 pm.     vitamin C 100 MG tablet  Take 1,000 mg by mouth daily.     XANAX 0.25 MG tablet  Generic drug:  ALPRAZolam  Take 0.25 mg by mouth 2 (two) times daily as needed for anxiety. Reported on 09/20/2015        Diet and Activity recommendation: See Discharge Instructions above  Major procedures and Radiology Reports - PLEASE review detailed and final reports for all details, in brief -    Dg Chest 2 View  09/27/2015  CLINICAL DATA:  Fever, shortness of breath and productive cough. Recently diagnosed pulmonary embolism. EXAM: CHEST  2 VIEW COMPARISON:  Previous  examinations. FINDINGS: Stable enlarged cardiac silhouette. Stable right jugular porta catheter. Left breast implant with capsular calcifications. Left breast and left axillary surgical clips. Clear lungs. Diffuse osteopenia. IMPRESSION: No acute abnormality. Electronically Signed   By: Claudie Revering M.D.   On: 09/27/2015 15:31   Ct Angio Chest Pe W/cm &/or Wo Cm  09/26/2015  CLINICAL DATA:  Inpatient. Shortness of breath. Metastatic breast cancer. EXAM: CT ANGIOGRAPHY CHEST WITH CONTRAST TECHNIQUE: Multidetector CT imaging of the chest was performed using the standard protocol during bolus administration of intravenous contrast. Multiplanar CT image reconstructions and MIPs were obtained to evaluate the vascular anatomy. CONTRAST:  144mL OMNIPAQUE IOHEXOL 350 MG/ML SOLN COMPARISON:  06/17/2015 PET-CT . FINDINGS: Mediastinum/Nodes: The study is high quality for the evaluation of pulmonary embolism. There is an acute subsegmental pulmonary embolus in the left lower lobe (series 7/ image 135). No additional pulmonary emboli. Mildly atherosclerotic nonaneurysmal thoracic aorta. Dilated main pulmonary artery (3.5 cm diameter), not appreciably changed from 06/17/2015. Normal heart size. No CT evidence of right heart strain. No pericardial fluid/thickening. Right internal jugular MediPort terminates at the cavoatrial junction. Normal visualized thyroid. Normal esophagus. Left axillary surgical clips are again noted. No axillary adenopathy. There are new mildly enlarged right supraclavicular lymph nodes, largest 1.0 cm (series 5/image 6). There is a mildly enlarged 1.0 cm right paratracheal node (series 5/image 22), mildly increased from 0.9 cm. No additional pathologically enlarged mediastinal or hilar nodes. Lungs/Pleura: No pneumothorax. No pleural effusion. Stable 4 mm anterior right lower lobe pulmonary nodule (series 8/ image 45). Subcentimeter calcified granuloma in the right lower lobe. Stable sharply marginated  reticulation and ground-glass opacity in the anterior left upper lobe in keeping with radiation fibrosis. No acute consolidative airspace disease or new significant pulmonary nodules. Upper abdomen: Small hiatal hernia. Partially visualized left adrenal metastasis. Musculoskeletal: Sclerotic lesion in the T9 vertebral body appears slightly increased in size. Mild-to-moderate degenerative changes in the thoracic spine. Intact appearing bilateral breast prostheses. Review of the MIP images confirms the above findings. IMPRESSION: 1. Acute subsegmental left lower lobe pulmonary embolus. 2. Stable chronic main pulmonary artery dilation, suggesting chronic pulmonary arterial hypertension. 3. No CT evidence of right heart strain.  4. New mild right supraclavicular and right paratracheal mediastinal lymphadenopathy, suspicious for metastatic disease. 5. Slight growth of T9 vertebral body sclerotic lesion, probably a bone metastasis . 6. Partially visualized left adrenal metastasis. 7. Stable tiny right lower lobe pulmonary nodule. Critical Value/emergent results were called by telephone at the time of interpretation on 09/26/2015 at 11:05 am to Dr. Florencia Reasons , who verbally acknowledged these results. Electronically Signed   By: Ilona Sorrel M.D.   On: 09/26/2015 11:13   US Abdomen Complete  09/24/2015  CLINICAL DATA:  Transaminitis for labs.  Initial encounter. EXAM: ABDOMEN ULTRASOUND COMPLETE COMPARISON:  MRI of the abdomen performed 08/23/2015 FINDINGS: Gallbladder: No gallstones or wall thickening visualized. Mild internal echoes within the gallbladder may reflect mild sludge. No sonographic Murphy sign noted by sonographer. Common bile duct: Diameter: 0.3 cm, within normal limits in caliber. Liver: No focal lesion identified. There is a mildly nodular contour to the liver, raising concern for some degree of hepatic cirrhosis. Within normal limits in parenchymal echogenicity. IVC: Not well characterized due to overlying  structures. Pancreas: Visualized portion unremarkable. Spleen: Size and appearance within normal limits. Right Kidney: Length: 14.6 cm. Echogenicity within normal limits. No mass or hydronephrosis visualized. Left Kidney:  Congenitally absent, per patient. Abdominal aorta: Not well characterized distally due to overlying bowel gas. No evidence of aneurysmal dilatation. Other findings: None. IMPRESSION: 1. Mildly nodular contour of the liver raises concern for some degree of hepatic cirrhosis, which may explain the patient's transaminitis. 2. No focal hepatic lesions seen on ultrasound, though if the patient's transaminitis significantly worsens, dynamic liver protocol MRI or CT could be considered for further evaluation. 3. Mild internal echoes within the gallbladder may reflect mild sludge. Gallbladder otherwise unremarkable. Electronically Signed   By: Garald Balding M.D.   On: 09/24/2015 19:06   Mr 3d Recon At Scanner  10/02/2015  CLINICAL DATA:  Metastatic breast cancer. EXAM: MRI ABDOMEN WITHOUT AND WITH CONTRAST (INCLUDING MRCP) TECHNIQUE: Multiplanar multisequence MR imaging of the abdomen was performed both before and after the administration of intravenous contrast. Heavily T2-weighted images of the biliary and pancreatic ducts were obtained, and three-dimensional MRCP images were rendered by post processing. CONTRAST:  40mL MULTIHANCE GADOBENATE DIMEGLUMINE 529 MG/ML IV SOLN COMPARISON:  MRI 08/23/2015 FINDINGS: Examination is limited by breathing motion artifact. Lower chest: No obvious lung base lesions or pleural effusion. No pericardial effusion. Bilateral breast prosthesis noted. Hepatobiliary: Stable cirrhotic changes involving the liver. No early arterial phase enhancing lesions to suggest dysplastic nodule or HCC. No intrahepatic biliary dilatation. Normal caliber and course of the common bile duct. Normal gallbladder. Pancreas: No pancreatic mass, inflammation or ductal dilatation. Spleen:  Normal size.  No focal lesions. Adrenals/Urinary Tract: The left adrenal gland mass is smaller. It previously measured 43.5 x 20.5 mm and now measures 35 x 11.5 mm. 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 The right kidney demonstrates compensatory hypertrophy. No renal lesions or hydronephrosis. Stomach/Bowel: The stomach, duodenum, visualized small bowel and visualize colon are grossly normal. Vascular/Lymphatic: Persistent but improved retroperitoneal lymphadenopathy. Largest nodal mass between the atretic left kidney and the aorta measures 30.5 x 25.5 mm on series 1102, image 60. This previously measured 47 x 39 mm. The aorta and branch vessels are patent. The major venous structures are patent. Other: No ascites. Stable anterior abdominal wall hernia containing fat. Musculoskeletal: No significant change in L2 vertebral body lesion. No new osseous lesions. IMPRESSION: 1. Stable cirrhotic changes involving the liver. No worrisome hepatic  lesions. 2. Interval decrease in size of the left adrenal mass and retroperitoneal lymphadenopathy. 3. Stable L2 metastatic lesion. Electronically Signed   By: Marijo Sanes M.D.   On: 10/02/2015 13:53   Ir Fluoro Guide Cv Line Right  09/08/2015  CLINICAL DATA:  70 year old female with a history of breast cancer. She has been referred for port catheter placement EXAM: IR RIGHT FLOURO GUIDE CV LINE; IR ULTRASOUND GUIDANCE VASC ACCESS RIGHT Date: 09/08/2015 ANESTHESIA/SEDATION: Moderate (conscious) sedation was administered during this procedure. A total of 4.0 mg Versed and 50 mg Fentanyl were administered intravenously. The patient's vital signs were monitored continuously by radiology nursing throughout the course of the procedure. Total sedation time: 25 minutes FLUOROSCOPY TIME:  18 second TECHNIQUE: The procedure, risks, benefits, and alternatives were explained to the patient. Questions regarding the procedure were encouraged and answered. The patient understands and  consents to the procedure. Ultrasound survey was performed with images stored and sent to PACs. The right neck and chest was prepped with chlorhexidine, and draped in the usual sterile fashion using maximum barrier technique (cap and mask, sterile gown, sterile gloves, large sterile sheet, hand hygiene and cutaneous antiseptic). Antibiotic prophylaxis was provided with 2.0g Ancef administered IV one hour prior to skin incision. Local anesthesia was attained by infiltration with 1% lidocaine without epinephrine. Ultrasound demonstrated patency of the right internal jugular vein, and this was documented with an image. Under real-time ultrasound guidance, this vein was accessed with a 21 gauge micropuncture needle and image documentation was performed. A small dermatotomy was made at the access site with an 11 scalpel. A 0.018" wire was advanced into the SVC and used to estimate the length of the internal catheter. The access needle exchanged for a 53F micropuncture vascular sheath. The 0.018" wire was then removed and a 0.035" wire advanced into the IVC. An appropriate location for the subcutaneous reservoir was selected below the clavicle and an incision was made through the skin and underlying soft tissues. The subcutaneous tissues were then dissected using a combination of blunt and sharp surgical technique and a pocket was formed. A single lumen power injectable portacatheter was then tunneled through the subcutaneous tissues from the pocket to the dermatotomy and the port reservoir placed within the subcutaneous pocket. The venous access site was then serially dilated and a peel away vascular sheath placed over the wire. The wire was removed and the port catheter advanced into position under fluoroscopic guidance. The catheter tip is positioned in the cavoatrial junction. This was documented with a spot image. The portacatheter was then tested and found to flush and aspirate well. The port was flushed with saline  followed by 100 units/mL heparinized saline. The pocket was then closed in two layers using first subdermal inverted interrupted absorbable sutures followed by a running subcuticular suture. The epidermis was then sealed with Dermabond. The dermatotomy at the venous access site was also seal with Dermabond. Patient tolerated the procedure well and remained hemodynamically stable throughout. No complications encountered and no significant blood loss encountered COMPLICATIONS: None.  The patient tolerated the procedure well. IMPRESSION: Status post placement of right IJ port.  Catheter ready for use. Signed, Dulcy Fanny. Earleen Newport, DO [facility] Radiology Electronically Signed   By: Corrie Mckusick D.O.   On: 09/08/2015 18:38   Ir US Guide Vasc Access Right  09/08/2015  CLINICAL DATA:  70 year old female with a history of breast cancer. She has been referred for port catheter placement EXAM:  IR RIGHT FLOURO GUIDE CV LINE; IR ULTRASOUND GUIDANCE VASC ACCESS RIGHT Date: 09/08/2015 ANESTHESIA/SEDATION: Moderate (conscious) sedation was administered during this procedure. A total of 4.0 mg Versed and 50 mg Fentanyl were administered intravenously. The patient's vital signs were monitored continuously by radiology nursing throughout the course of the procedure. Total sedation time: 25 minutes FLUOROSCOPY TIME:  18 second TECHNIQUE: The procedure, risks, benefits, and alternatives were explained to the patient. Questions regarding the procedure were encouraged and answered. The patient understands and consents to the procedure. Ultrasound survey was performed with images stored and sent to PACs. The right neck and chest was prepped with chlorhexidine, and draped in the usual sterile fashion using maximum barrier technique (cap and mask, sterile gown, sterile gloves, large sterile sheet, hand hygiene and cutaneous antiseptic). Antibiotic prophylaxis was provided with 2.0g Ancef  administered IV one hour prior to skin incision. Local anesthesia was attained by infiltration with 1% lidocaine without epinephrine. Ultrasound demonstrated patency of the right internal jugular vein, and this was documented with an image. Under real-time ultrasound guidance, this vein was accessed with a 21 gauge micropuncture needle and image documentation was performed. A small dermatotomy was made at the access site with an 11 scalpel. A 0.018" wire was advanced into the SVC and used to estimate the length of the internal catheter. The access needle exchanged for a 36F micropuncture vascular sheath. The 0.018" wire was then removed and a 0.035" wire advanced into the IVC. An appropriate location for the subcutaneous reservoir was selected below the clavicle and an incision was made through the skin and underlying soft tissues. The subcutaneous tissues were then dissected using a combination of blunt and sharp surgical technique and a pocket was formed. A single lumen power injectable portacatheter was then tunneled through the subcutaneous tissues from the pocket to the dermatotomy and the port reservoir placed within the subcutaneous pocket. The venous access site was then serially dilated and a peel away vascular sheath placed over the wire. The wire was removed and the port catheter advanced into position under fluoroscopic guidance. The catheter tip is positioned in the cavoatrial junction. This was documented with a spot image. The portacatheter was then tested and found to flush and aspirate well. The port was flushed with saline followed by 100 units/mL heparinized saline. The pocket was then closed in two layers using first subdermal inverted interrupted absorbable sutures followed by a running subcuticular suture. The epidermis was then sealed with Dermabond. The dermatotomy at the venous access site was also seal with Dermabond. Patient tolerated the procedure well and remained hemodynamically stable  throughout. No complications encountered and no significant blood loss encountered COMPLICATIONS: None.  The patient tolerated the procedure well. IMPRESSION: Status post placement of right IJ port.  Catheter ready for use. Signed, Dulcy Fanny. Earleen Newport, DO [facility] Electronically Signed   By: Corrie Mckusick D.O.   On: 09/08/2015 18:38   Dg Chest Port 1 View  09/24/2015  CLINICAL DATA:  Fever.  Breast carcinoma. EXAM: PORTABLE CHEST 1 VIEW COMPARISON:  Jan 16, 2011 FINDINGS: Port-A-Cath tip is in the superior vena cava. No pneumothorax. There is ill-defined opacity in the right upper lobe, concerning for pneumonia. Lungs elsewhere appear clear. Heart is upper normal in size with pulmonary vascularity within normal limits. No apparent adenopathy. There is postoperative change on the left with clips in left axillary region. There are breast implants bilaterally. No bone lesions. IMPRESSION: Ill-defined opacity right upper  lobe, concerning for pneumonia. Lungs elsewhere appear clear. No adenopathy apparent. Electronically Signed   By: Lowella Grip III M.D.   On: 09/24/2015 15:45   Mr Abd W/wo Cm/mrcp  10/02/2015  CLINICAL DATA:  Metastatic breast cancer. EXAM: MRI ABDOMEN WITHOUT AND WITH CONTRAST (INCLUDING MRCP) TECHNIQUE: Multiplanar multisequence MR imaging of the abdomen was performed both before and after the administration of intravenous contrast. Heavily T2-weighted images of the biliary and pancreatic ducts were obtained, and three-dimensional MRCP images were rendered by post processing. CONTRAST:  53mL MULTIHANCE GADOBENATE DIMEGLUMINE 529 MG/ML IV SOLN COMPARISON:  MRI 08/23/2015 FINDINGS: Examination is limited by breathing motion artifact. Lower chest: No obvious lung base lesions or pleural effusion. No pericardial effusion. Bilateral breast prosthesis noted. Hepatobiliary: Stable cirrhotic changes involving the liver. No early arterial  phase enhancing lesions to suggest dysplastic nodule or HCC. No intrahepatic biliary dilatation. Normal caliber and course of the common bile duct. Normal gallbladder. Pancreas: No pancreatic mass, inflammation or ductal dilatation. Spleen: Normal size.  No focal lesions. Adrenals/Urinary Tract: The left adrenal gland mass is smaller. It previously measured 43.5 x 20.5 mm and now measures 35 x 11.5 mm. 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 The right kidney demonstrates compensatory hypertrophy. No renal lesions or hydronephrosis. Stomach/Bowel: The stomach, duodenum, visualized small bowel and visualize colon are grossly normal. Vascular/Lymphatic: Persistent but improved retroperitoneal lymphadenopathy. Largest nodal mass between the atretic left kidney and the aorta measures 30.5 x 25.5 mm on series 1102, image 60. This previously measured 47 x 39 mm. The aorta and branch vessels are patent. The major venous structures are patent. Other: No ascites. Stable anterior abdominal wall hernia containing fat. Musculoskeletal: No significant change in L2 vertebral body lesion. No new osseous lesions. IMPRESSION: 1. Stable cirrhotic changes involving the liver. No worrisome hepatic lesions. 2. Interval decrease in size of the left adrenal mass and retroperitoneal lymphadenopathy. 3. Stable L2 metastatic lesion. Electronically Signed   By: Marijo Sanes M.D.   On: 10/02/2015 13:53    Micro Results   Recent Results (from the past 240 hour(s))  Urine culture     Status: None   Collection Time: 09/25/15 12:13 AM  Result Value Ref Range Status   Specimen Description URINE, CLEAN CATCH  Final   Special Requests NONE  Final   Culture   Final    NO GROWTH 1 DAY Performed at [facility]    Report Status 09/26/2015 FINAL  Final  Culture, sputum-assessment     Status: None   Collection Time: 09/25/15 11:10 PM  Result Value Ref Range Status   Specimen Description SPU  Final   Special Requests NONE  Final    Sputum evaluation   Final    MICROSCOPIC FINDINGS SUGGEST THAT THIS SPECIMEN IS NOT REPRESENTATIVE OF LOWER RESPIRATORY SECRETIONS. PLEASE RECOLLECT. HOLT,B RN [id number] [place],N    Report Status 09/26/2015 FINAL  Final  Respiratory virus panel     Status: Abnormal   Collection Time: 09/26/15 12:16 PM  Result Value Ref Range Status   Respiratory Syncytial Virus A Negative Negative Final   Respiratory Syncytial Virus B Negative Negative Final   Influenza A Positive (A) Negative Final    Comment: Subtype: H3   Influenza B Negative Negative Final   Parainfluenza 1 Negative Negative Final   Parainfluenza 2 Negative Negative Final   Parainfluenza 3 Negative Negative Final   Metapneumovirus Negative Negative Final   Rhinovirus Negative Negative Final   Adenovirus Negative Negative  Final    Comment: (NOTE) Performed At: [facility] [place], [place] [id number] Lindon Romp MD [id number]   Nasal culture     Status: None   Collection Time: 09/26/15  4:24 PM  Result Value Ref Range Status   Specimen Description NOSE  Final   Special Requests NONE  Final   Culture   Final    NORMAL NASOPHARYNGEAL FLORA Performed at [facility]    Report Status 09/29/2015 FINAL  Final  MRSA PCR Screening     Status: None   Collection Time: 09/27/15 11:36 AM  Result Value Ref Range Status   MRSA by PCR NEGATIVE NEGATIVE Final    Comment:        The GeneXpert MRSA Assay (FDA approved for NASAL specimens only), is one component of a comprehensive MRSA colonization surveillance program. It is not intended to diagnose MRSA infection nor to guide or monitor treatment for MRSA infections.   Culture, expectorated sputum-assessment     Status: None   Collection Time: 09/27/15  4:14 PM  Result Value Ref Range Status   Specimen Description SPU  Final   Special Requests Immunocompromised  Final   Sputum evaluation   Final    MICROSCOPIC FINDINGS SUGGEST THAT  THIS SPECIMEN IS NOT REPRESENTATIVE OF LOWER RESPIRATORY SECRETIONS. PLEASE RECOLLECT. Virgie RN [id number] [place]    Report Status 09/27/2015 FINAL  Final       Today   Subjective:   Ahnna Maria Pruitt today has no headache,no chest abdominal pain,no new weakness tingling or numbness, feels much better wants to go home today.   Objective:   Blood pressure 118/77, pulse 86, temperature 98.1 F (36.7 C), temperature source Oral, resp. rate 18, height 5\' 4"  (1.626 m), weight 83 kg (182 lb 15.7 oz), SpO2 97 %.   Intake/Output Summary (Last 24 hours) at 10/04/15 1901 Last data filed at 10/04/15 1500  Gross per 24 hour  Intake    935 ml  Output      0 ml  Net    935 ml    Exam Awake Alert, Oriented x 3, No new F.N deficits, Normal affect [place].AT,PERRAL Supple Neck,No JVD, No cervical lymphadenopathy appriciated.  Symmetrical Chest wall movement, Good air movement bilaterally, CTAB RRR,No Gallops,Rubs or new Murmurs, No Parasternal Heave +ve B.Sounds, Abd Soft, Non tender, No organomegaly appriciated, No rebound -guarding or rigidity. No Cyanosis, Clubbing or edema, No new Rash or bruise  Data Review   CBC w Diff: Lab Results  Component Value Date   WBC 4.4 10/03/2015   WBC 3.1* 09/20/2015   HGB 10.1* 10/03/2015   HGB 13.1 09/20/2015   HCT 28.5* 10/03/2015   HCT 37.7 09/20/2015   PLT 137* 10/03/2015   PLT 220 09/20/2015   LYMPHOPCT 30 10/03/2015   LYMPHOPCT 32.5 09/20/2015   MONOPCT 14 10/03/2015   MONOPCT 9.9 09/20/2015   EOSPCT 0 10/03/2015   EOSPCT 1.7 09/20/2015   BASOPCT 0 10/03/2015   BASOPCT 0.1 09/20/2015    CMP: Lab Results  Component Value Date   NA 140 10/03/2015   NA 127* 09/20/2015   K 3.4* 10/03/2015   K 3.2* 09/20/2015   CL 103 10/03/2015   CO2 29 10/03/2015   CO2 23 09/20/2015   BUN <5* 10/03/2015   BUN 18.3 09/20/2015   CREATININE 0.44 10/03/2015   CREATININE 1.0 09/20/2015   PROT 5.4* 10/04/2015   PROT 7.0 09/20/2015   ALBUMIN  2.3* 10/04/2015  ALBUMIN 3.3* 09/20/2015   BILITOT 4.6* 10/04/2015   BILITOT 2.22* 09/20/2015   ALKPHOS 502* 10/04/2015   ALKPHOS 187* 09/20/2015   AST 153* 10/04/2015   AST 74* 09/20/2015   ALT 90* 10/04/2015   ALT 107* 09/20/2015  .   Total Time in preparing paper work, data evaluation and todays exam - 35 minutes  Amiel Sharrow M.D on 10/04/2015 at [place]  [phone]

## 2015-10-04 NOTE — Progress Notes (Signed)
[facility] Progress Note  Subjective:  Feels ok.  Not eating much.  Had several BM's last night.  Ultrasound with elastography not able to be performed as inpatient.  Awaiting several liver serologies.  LFT's down slightly.  Objective:  Vital signs in last 24 hours: Temp:  [98.1 F (36.7 C)-98.3 F (36.8 C)] 98.1 F (36.7 C) (01/24 0458) Pulse Rate:  [79-89] 79 (01/24 0458) Resp:  [20] 20 (01/24 0458) BP: (112-146)/(53-89) 146/89 mmHg (01/24 0458) SpO2:  [96 %-98 %] 98 % (01/24 0758) Weight:  [182 lb 15.7 oz (83 kg)] 182 lb 15.7 oz (83 kg) (01/24 0458) Last BM Date: 10/05/15 General:  Alert, chronically ill-appearing, in NAD; scleral icterus noted. Heart:  Regular rate and rhythm; no murmurs Pulm:  CTAB.  No W/R/R. Abdomen:  Soft, nontender and nondistended. Normal bowel sounds, without guarding, and without rebound.   Extremities:  Without edema. Neurologic:  Alert and oriented x 4; grossly normal neurologically. Psych:  Alert and cooperative. Normal mood and affect.  Intake/Output from previous day: 01/23 0701 - 01/24 0700 In: 615 [I.V.:10; IV Piggyback:605] Out: -   Lab Results:  Recent Labs  10/02/15 0520 10/03/15 0421  WBC 3.4* 4.4  HGB 10.2* 10.1*  HCT 29.8* 28.5*  PLT 141* 137*   BMET  Recent Labs  10/02/15 0520 10/03/15 0421  NA 143 140  K 2.9* 3.4*  CL 106 103  CO2 25 29  GLUCOSE 141* 109*  BUN 6 <5*  CREATININE 0.62 0.44  CALCIUM 6.6* 7.0*   LFT  Recent Labs  10/04/15 0400  PROT 5.4*  ALBUMIN 2.3*  AST 153*  ALT 90*  ALKPHOS 502*  BILITOT 4.6*  BILIDIR 2.5*  IBILI 2.1*   PT/INR  Recent Labs  10/02/15 0520 10/04/15 0400  LABPROT 15.1 14.7  INR 1.18 1.14   Mr 3d Recon At Scanner  10/02/2015  CLINICAL DATA:  Metastatic breast cancer. EXAM: MRI ABDOMEN WITHOUT AND WITH CONTRAST (INCLUDING MRCP) TECHNIQUE: Multiplanar multisequence MR imaging of the abdomen was performed both before and after the administration  of intravenous contrast. Heavily T2-weighted images of the biliary and pancreatic ducts were obtained, and three-dimensional MRCP images were rendered by post processing. CONTRAST:  30mL MULTIHANCE GADOBENATE DIMEGLUMINE 529 MG/ML IV SOLN COMPARISON:  MRI 08/23/2015 FINDINGS: Examination is limited by breathing motion artifact. Lower chest: No obvious lung base lesions or pleural effusion. No pericardial effusion. Bilateral breast prosthesis noted. Hepatobiliary: Stable cirrhotic changes involving the liver. No early arterial phase enhancing lesions to suggest dysplastic nodule or HCC. No intrahepatic biliary dilatation. Normal caliber and course of the common bile duct. Normal gallbladder. Pancreas: No pancreatic mass, inflammation or ductal dilatation. Spleen: Normal size.  No focal lesions. Adrenals/Urinary Tract: The left adrenal gland mass is smaller. It previously measured 43.5 x 20.5 mm and now measures 35 x 11.5 mm. 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 The right kidney demonstrates compensatory hypertrophy. No renal lesions or hydronephrosis. Stomach/Bowel: The stomach, duodenum, visualized small bowel and visualize colon are grossly normal. Vascular/Lymphatic: Persistent but improved retroperitoneal lymphadenopathy. Largest nodal mass between the atretic left kidney and the aorta measures 30.5 x 25.5 mm on series 1102, image 60. This previously measured 47 x 39 mm. The aorta and branch vessels are patent. The major venous structures are patent. Other: No ascites. Stable anterior abdominal wall hernia containing fat. Musculoskeletal: No significant change in L2 vertebral body lesion. No new osseous lesions. IMPRESSION: 1. Stable cirrhotic changes involving the  liver. No worrisome hepatic lesions. 2. Interval decrease in size of the left adrenal mass and retroperitoneal lymphadenopathy. 3. Stable L2 metastatic lesion. Electronically Signed   By: Marijo Sanes M.D.   On: 10/02/2015 13:53   Mr Abd W/wo  Cm/mrcp  10/02/2015  CLINICAL DATA:  Metastatic breast cancer. EXAM: MRI ABDOMEN WITHOUT AND WITH CONTRAST (INCLUDING MRCP) TECHNIQUE: Multiplanar multisequence MR imaging of the abdomen was performed both before and after the administration of intravenous contrast. Heavily T2-weighted images of the biliary and pancreatic ducts were obtained, and three-dimensional MRCP images were rendered by post processing. CONTRAST:  27mL MULTIHANCE GADOBENATE DIMEGLUMINE 529 MG/ML IV SOLN COMPARISON:  MRI 08/23/2015 FINDINGS: Examination is limited by breathing motion artifact. Lower chest: No obvious lung base lesions or pleural effusion. No pericardial effusion. Bilateral breast prosthesis noted. Hepatobiliary: Stable cirrhotic changes involving the liver. No early arterial phase enhancing lesions to suggest dysplastic nodule or HCC. No intrahepatic biliary dilatation. Normal caliber and course of the common bile duct. Normal gallbladder. Pancreas: No pancreatic mass, inflammation or ductal dilatation. Spleen: Normal size.  No focal lesions. Adrenals/Urinary Tract: The left adrenal gland mass is smaller. It previously measured 43.5 x 20.5 mm and now measures 35 x 11.5 mm. 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 The right kidney demonstrates compensatory hypertrophy. No renal lesions or hydronephrosis. Stomach/Bowel: The stomach, duodenum, visualized small bowel and visualize colon are grossly normal. Vascular/Lymphatic: Persistent but improved retroperitoneal lymphadenopathy. Largest nodal mass between the atretic left kidney and the aorta measures 30.5 x 25.5 mm on series 1102, image 60. This previously measured 47 x 39 mm. The aorta and branch vessels are patent. The major venous structures are patent. Other: No ascites. Stable anterior abdominal wall hernia containing fat. Musculoskeletal: No significant change in L2 vertebral body lesion. No new osseous lesions. IMPRESSION: 1. Stable cirrhotic changes involving the liver. No  worrisome hepatic lesions. 2. Interval decrease in size of the left adrenal mass and retroperitoneal lymphadenopathy. 3. Stable L2 metastatic lesion. Electronically Signed   By: Marijo Sanes M.D.   On: 10/02/2015 13:53   Assessment / Plan: 70 year old female with a history of invasive lobular carcinoma with metastasis to the left adrenal gland, regional lymph nodes, and bone. Estrogen, progesterone, HAR-2 negative. PET scan October 2016 showed some periaortic adenopathy and multiple hypermetabolic bone metastases. She had an EGD by Dr. Carlean Purl in October 2016 showing no evidence of malignancy. She has had PET scan in November MRI with no evidence of metastasis to the liver. She has been noted to have a significant increase in her LFTs this admission. She does have a long-standing history of fatty liver which may account for her cirrhosis, however, we are awaiting further serologies to evaluate for other etiologies of chronic liver disease.  Ultrasound with elastography cannot be done as inpatient so will have that done once she is discharged. LFTs started rising in early January --? chemotherapy may be exacerbating this.  LFT's are minimally improved this AM.  Had elevated ammonia level so Xifaxan 550 mg BID was added and lactulose was increased to 30 grams TID; titrate lactulose to 3-4 soft BM's daily.    LOS: 10 days   ZEHR, JESSICA D.  10/04/2015, 10:19 AM  Pager number [phone]     Attending physician's note   I have taken an interval history, reviewed the chart and examined the patient. I agree with the Advanced Practitioner's note, impression and recommendations. LFTs are stable. Ceroloplasmin negative. Elevated ferritin likely related to  metastatic breat cancer, not hemochromatosis, however will check Fe, TIBC. Her hepatic evaluation can be completed as outpatient with Dr. Carlean Purl.   Lucio Edward, MD [facility] [phone] Mon-Fri 8a-5p [phone] after 5p, weekends, holidays

## 2015-10-04 NOTE — Progress Notes (Signed)
Maria Pruitt   DOB:07-07-1946   SU#:[id number]   OPF#:[id number]  Subjective: Maria Pruitt is in bed, but tells me she has been up numorous times (to [facility]). She feels better and is eager to go ome. Son feels they have enough support at home between patient's sister and daughter in law, and of course p[atient's husband is there (but she always said she was HIS caregiver)  Objective:  Filed Vitals:   10/03/15 2233 10/04/15 0458  BP: 119/67 146/89  Pulse:  79  Temp:  98.1 F (36.7 C)  Resp:  20    Body mass index is 31.39 kg/(m^2).  Intake/Output Summary (Last 24 hours) at 10/04/15 1304 Last data filed at 10/04/15 0359  Gross per 24 hour  Intake    615 ml  Output      0 ml  Net    615 ml   Anderson [place] is A&O x3 today  CBG (last 3)  No results for input(s): GLUCAP in the last 72 hours.   Labs:  Lab Results  Component Value Date   WBC 4.4 10/03/2015   HGB 10.1* 10/03/2015   HCT 28.5* 10/03/2015   MCV 102.5* 10/03/2015   PLT 137* 10/03/2015   NEUTROABS 2.5 10/03/2015    @LASTCHEMISTRY @  Urine Studies No results for input(s): UHGB, CRYS in the last 72 hours.  Invalid input(s): UACOL, UAPR, USPG, UPH, UTP, UGL, Maria Pruitt, UBIL, UNIT, UROB, Maria Pruitt, UEPI, UWBC, Maria Pruitt, [place]  Basic Metabolic Panel:  Recent Labs Lab 09/28/15 0429 09/29/15 0355 09/30/15 0325 10/01/15 0445 10/02/15 0520 10/03/15 0421  NA 136 139 140 141 143 140  K 3.2* 3.0* 3.8 3.8 2.9* 3.4*  CL 104 106 107 108 106 103  CO2 21* 19* 22 21* 25 29  GLUCOSE 223* 217* 208* 134* 141* 109*  BUN 7 5* 5* 8 6 <5*  CREATININE 0.94 0.89 0.66 0.56 0.62 0.44  CALCIUM 6.5* 6.3* 6.1* 6.1* 6.6* 7.0*  MG 1.3* 1.6*  --  1.4*  --  1.5*  PHOS  --   --   --   --   --  1.5*   GFR Estimated Creatinine Clearance: 71.1 mL/min (by C-G formula based on Cr of 0.44). Liver Function Tests:  Recent Labs Lab 09/29/15 0355 09/30/15 0325 10/01/15 0445 10/02/15 0520 10/04/15 0400  AST 153* 160* 183* 175* 153*   ALT 106* 87* 102* 102* 90*  ALKPHOS 266* 335* 419* 516* 502*  BILITOT 4.1* 3.8* 4.0* 4.8* 4.6*  PROT 5.6* 5.1* 5.4* 5.3* 5.4*  ALBUMIN 2.5* 2.3* 2.4* 2.4* 2.3*   No results for input(s): LIPASE, AMYLASE in the last 168 hours.  Recent Labs Lab 09/29/15 0650 09/30/15 0520 10/01/15 0445 10/02/15 0505 10/03/15 0421  AMMONIA 68* 87* 99* 66* 78*   Coagulation profile  Recent Labs Lab 10/02/15 0520 10/04/15 0400  INR 1.18 1.14    CBC:  Recent Labs Lab 09/29/15 0355 09/30/15 0325 10/01/15 0445 10/02/15 0520 10/03/15 0421  WBC 1.9* 1.7* 2.4* 3.4* 4.4  NEUTROABS  --   --   --   --  2.5  HGB 10.2* 9.0* 9.6* 10.2* 10.1*  HCT 28.7* 25.4* 27.6* 29.8* 28.5*  MCV 101.8* 102.8* 104.9* 104.6* 102.5*  PLT 139* 110* 122* 141* 137*   Cardiac Enzymes: No results for input(s): CKTOTAL, CKMB, CKMBINDEX, TROPONINI in the last 168 hours. BNP: Invalid input(s): POCBNP CBG: No results for input(s): GLUCAP in the last 168 hours. D-Dimer No results for input(s): DDIMER in the  last 72 hours. Hgb A1c No results for input(s): HGBA1C in the last 72 hours. Lipid Profile No results for input(s): CHOL, HDL, LDLCALC, TRIG, CHOLHDL, LDLDIRECT in the last 72 hours. Thyroid function studies No results for input(s): TSH, T4TOTAL, T3FREE, THYROIDAB in the last 72 hours.  Invalid input(s): FREET3 Anemia work up  Recent Labs  10/03/15 1620  FERRITIN 2310*   Microbiology Recent Results (from the past 240 hour(s))  Culture, blood (Routine X 2) w Reflex to ID Panel     Status: None   Collection Time: 09/24/15  3:25 PM  Result Value Ref Range Status   Specimen Description   Final    BLOOD PORTA CATH BOTTLES DRAWN AEROBIC AND ANAEROBIC   Special Requests 10CC  Final   Culture   Final    NO GROWTH 5 DAYS Performed at [facility]    Report Status 09/29/2015 FINAL  Final  Culture, blood (Routine X 2) w Reflex to ID Panel     Status: None   Collection Time: 09/24/15  4:00 PM   Result Value Ref Range Status   Specimen Description BLOOD BLOOD RIGHT HAND  Final   Special Requests BOTTLES DRAWN AEROBIC AND ANAEROBIC 5CC  Final   Culture   Final    NO GROWTH 5 DAYS Performed at [facility]    Report Status 09/29/2015 FINAL  Final  Urine culture     Status: None   Collection Time: 09/25/15 12:13 AM  Result Value Ref Range Status   Specimen Description URINE, CLEAN CATCH  Final   Special Requests NONE  Final   Culture   Final    NO GROWTH 1 DAY Performed at [facility]    Report Status 09/26/2015 FINAL  Final  Culture, sputum-assessment     Status: None   Collection Time: 09/25/15 11:10 PM  Result Value Ref Range Status   Specimen Description SPU  Final   Special Requests NONE  Final   Sputum evaluation   Final    MICROSCOPIC FINDINGS SUGGEST THAT THIS SPECIMEN IS NOT REPRESENTATIVE OF LOWER RESPIRATORY SECRETIONS. PLEASE RECOLLECT. HOLT,B RN [id number] [place],N    Report Status 09/26/2015 FINAL  Final  Respiratory virus panel     Status: Abnormal   Collection Time: 09/26/15 12:16 PM  Result Value Ref Range Status   Respiratory Syncytial Virus A Negative Negative Final   Respiratory Syncytial Virus B Negative Negative Final   Influenza A Positive (A) Negative Final    Comment: Subtype: H3   Influenza B Negative Negative Final   Parainfluenza 1 Negative Negative Final   Parainfluenza 2 Negative Negative Final   Parainfluenza 3 Negative Negative Final   Metapneumovirus Negative Negative Final   Rhinovirus Negative Negative Final   Adenovirus Negative Negative Final    Comment: (NOTE) Performed At: [facility] [address] [place], [place] [id number] Maria Romp MD MG:[id number]   Nasal culture     Status: None   Collection Time: 09/26/15  4:24 PM  Result Value Ref Range Status   Specimen Description NOSE  Final   Special Requests NONE  Final   Culture   Final    NORMAL NASOPHARYNGEAL FLORA Performed at  [facility]    Report Status 09/29/2015 FINAL  Final  MRSA PCR Screening     Status: None   Collection Time: 09/27/15 11:36 AM  Result Value Ref Range Status   MRSA by PCR NEGATIVE NEGATIVE Final    Comment:  The GeneXpert MRSA Assay (FDA approved for NASAL specimens only), is one component of a comprehensive MRSA colonization surveillance program. It is not intended to diagnose MRSA infection nor to guide or monitor treatment for MRSA infections.   Culture, expectorated sputum-assessment     Status: None   Collection Time: 09/27/15  4:14 PM  Result Value Ref Range Status   Specimen Description SPU  Final   Special Requests Immunocompromised  Final   Sputum evaluation   Final    MICROSCOPIC FINDINGS SUGGEST THAT THIS SPECIMEN IS NOT REPRESENTATIVE OF LOWER RESPIRATORY SECRETIONS. PLEASE RECOLLECT. [place] RN [phone] [place]    Report Status 09/27/2015 FINAL  Final      Studies:  Mr 3d Recon At Scanner  10/02/2015  CLINICAL DATA:  Metastatic breast cancer. EXAM: MRI ABDOMEN WITHOUT AND WITH CONTRAST (INCLUDING MRCP) TECHNIQUE: Multiplanar multisequence MR imaging of the abdomen was performed both before and after the administration of intravenous contrast. Heavily T2-weighted images of the biliary and pancreatic ducts were obtained, and three-dimensional MRCP images were rendered by post processing. CONTRAST:  86m MULTIHANCE GADOBENATE DIMEGLUMINE 529 MG/ML IV SOLN COMPARISON:  MRI 08/23/2015 FINDINGS: Examination is limited by breathing motion artifact. Lower chest: No obvious lung base lesions or pleural effusion. No pericardial effusion. Bilateral breast prosthesis noted. Hepatobiliary: Stable cirrhotic changes involving the liver. No early arterial phase enhancing lesions to suggest dysplastic nodule or HCC. No intrahepatic biliary dilatation. Normal caliber and course of the common bile duct. Normal gallbladder. Pancreas: No pancreatic mass, inflammation  or ductal dilatation. Spleen: Normal size.  No focal lesions. Adrenals/Urinary Tract: The left adrenal gland mass is smaller. It previously measured 43.5 x 20.5 mm and now measures 35 x 11.5 mm. 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 The right kidney demonstrates compensatory hypertrophy. No renal lesions or hydronephrosis. Stomach/Bowel: The stomach, duodenum, visualized small bowel and visualize colon are grossly normal. Vascular/Lymphatic: Persistent but improved retroperitoneal lymphadenopathy. Largest nodal mass between the atretic left kidney and the aorta measures 30.5 x 25.5 mm on series 1102, image 60. This previously measured 47 x 39 mm. The aorta and branch vessels are patent. The major venous structures are patent. Other: No ascites. Stable anterior abdominal wall hernia containing fat. Musculoskeletal: No significant change in L2 vertebral body lesion. No new osseous lesions. IMPRESSION: 1. Stable cirrhotic changes involving the liver. No worrisome hepatic lesions. 2. Interval decrease in size of the left adrenal mass and retroperitoneal lymphadenopathy. 3. Stable L2 metastatic lesion. Electronically Signed   By: PMarijo SanesM.D.   On: 10/02/2015 13:53   Mr Abd W/wo Cm/mrcp  10/02/2015  CLINICAL DATA:  Metastatic breast cancer. EXAM: MRI ABDOMEN WITHOUT AND WITH CONTRAST (INCLUDING MRCP) TECHNIQUE: Multiplanar multisequence MR imaging of the abdomen was performed both before and after the administration of intravenous contrast. Heavily T2-weighted images of the biliary and pancreatic ducts were obtained, and three-dimensional MRCP images were rendered by post processing. CONTRAST:  18mMULTIHANCE GADOBENATE DIMEGLUMINE 529 MG/ML IV SOLN COMPARISON:  MRI 08/23/2015 FINDINGS: Examination is limited by breathing motion artifact. Lower chest: No obvious lung base lesions or pleural effusion. No pericardial effusion. Bilateral breast prosthesis noted. Hepatobiliary: Stable cirrhotic changes involving the  liver. No early arterial phase enhancing lesions to suggest dysplastic nodule or HCC. No intrahepatic biliary dilatation. Normal caliber and course of the common bile duct. Normal gallbladder. Pancreas: No pancreatic mass, inflammation or ductal dilatation. Spleen: Normal size.  No focal lesions. Adrenals/Urinary Tract: The left adrenal gland mass is  smaller. It previously measured 43.5 x 20.5 mm and now measures 35 x 11.5 mm. 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 The right kidney demonstrates compensatory hypertrophy. No renal lesions or hydronephrosis. Stomach/Bowel: The stomach, duodenum, visualized small bowel and visualize colon are grossly normal. Vascular/Lymphatic: Persistent but improved retroperitoneal lymphadenopathy. Largest nodal mass between the atretic left kidney and the aorta measures 30.5 x 25.5 mm on series 1102, image 60. This previously measured 47 x 39 mm. The aorta and branch vessels are patent. The major venous structures are patent. Other: No ascites. Stable anterior abdominal wall hernia containing fat. Musculoskeletal: No significant change in L2 vertebral body lesion. No new osseous lesions. IMPRESSION: 1. Stable cirrhotic changes involving the liver. No worrisome hepatic lesions. 2. Interval decrease in size of the left adrenal mass and retroperitoneal lymphadenopathy. 3. Stable L2 metastatic lesion. Electronically Signed   By: Marijo Sanes M.D.   On: 10/02/2015 13:53    Assessment: 70 y.o. [place], [place] woman  (1) status post left breast biopsy in February of 2011 for an invasive lobular carcinoma measuring 2.9 cm by MRI, with a positive prechemotherapy axillary lymph node biopsy, strongly estrogen and progesterone receptor positive with no evidence of HER-2/neu amplification and an MIB-1 of 14%.   (2) received 6 cycles of docetaxel/ cyclophosphamide in the neoadjuvant setting, completed in mid July of 2011   (3) s/p left lumpectomy and axillary lymph node  dissection in August of 2011 for a ypT1b yTN3 (14 positive lymph nodes), Stage IIIC, grade 1 residual tumor. HER-2/neu was repeated, again not amplified.   (4) Completed loco-regional radiation in late October of 2011   (5) on letrozole starting early November 2011, continued to October 2016, with progression  METASTATIC DISEASE: September 2016: Involving left adrenal gland, regional nodes and bones  (6) biopsy of a 7.5 cm left adrenal mass 05/30/2015 showed metastatic carcinoma, estrogen, progesterone, and HER-2 negative, with an MIB-1 of 90-100%; the tumor cells were cytokeratin 7 positive, cytokeratin 20 focally positive, gross cystic disease fluid protein negative (a) the CA-27-29 is informative (b) PET scan 06/17/2015 shows, in addition to the left adrenal mass, some periaortic adenopathy and multiple hypermetabolic bone metastases (L2, T9, T2 and others) (c) EGD on 06/16/2015 to evaluate suspicious cardiac wall thickening showed no evidence of malignancy  (7) capecitabine started 07/04/2015 at 1.5 g BID 7/7. Discontinued 08/29/15 with progression  (8) denosumab/ Xgeva started 07/18/2015, repeated monthly  (9) eribulin D1/D8 q 21 days started 09/14/15  (10) cryptogenic cirrhosis with encephalopathy  PLAN: She is nearly 3 weeks out from the start of chemotherapy and would be due for repeat tomorrow-- we are of course waiting until her functional status improves further.  I suspect the elevated ferritin is due to multiple PRBC transfusions and the drug being an acute phase reactant. Other possible causes of her cirrhosis per extensive GI workup pending.  Patient and family feel she could go home. Today she is A&O x3. I think it would be useful if she had PT at home as every time I come by to see her she is in bed.  She has an appt with me next week--patient and family aware-- and we will consider resuming eribulin at a much lower dose vs. Waiting  another week before restarting. The cancer is certainly responding. It is unclear how much of what we're seeing is due to the concomitant chemotherapy but that remains a concern.  Greatly appreciate your help to this patient!  [name], MD  10/04/2015  1:04 PM Medical Oncology and Hematology [facility] [address] [place], [place] [address] Tel. [phone]    Fax. [phone]

## 2015-10-04 NOTE — Progress Notes (Signed)
Nutrition Follow-up  DOCUMENTATION CODES:   Not applicable  INTERVENTION:  - Will d/c Boost Breeze per pt request - RD will continue to monitor for needs  NUTRITION DIAGNOSIS:   Increased nutrient needs related to catabolic illness, cancer and cancer related treatments as evidenced by estimated needs. -ongoing  GOAL:   Patient will meet greater than or equal to 90% of their needs -variably met since last assessment  MONITOR:   PO intake, Weight trends, Labs, Skin, I & O's  ASSESSMENT:   70 year old female with invasive lobular breast carcinoma metastatic to left adrenal gland, regional nodes and bones, follows with Dr. Jana Hakim, patient started treatment 8 weeks prior to this admission currently on daily oral chemotherapy, started IV chemotherapy 2 weeks prior to this admission and gets these treatments every 2 weeks. She now presents to [facility] long emergency department with several days duration of progressively worsening weakness, nausea and nonbloody vomiting, poor oral intake and fatigue. Patient also reports subjective fevers and chills, cough mixed nonproductive and productive clear sputum. Patient also reports intermittent chest discomfort that occurs with coughing spells. Patient denies any specific sick contacts or exposures. She reports abdominal discomfort that occurs with vomiting episodes. Last chemotherapy on Tuesday, 4 days prior to this admission.  1/24 No intakes documented since last assessment. Pt was NPO since midnight for possible ultrasound but this was unable to be done and diet was re-advanced accordingly. Pt states that since last assessment burning sensation has resolved and she has been tolerating solid foods. She states that she had a fish sandwich from [employer] for dinner which she tolerated well. She also drank a Boost Breeze at that time. She states that she does not like this supplements and would like it to be d/c'ed. Pt denies order of any other  supplements at this time. She states her son is bringing her a Advertising account executive for lunch today.  Pt likely to meet needs with resolution of mucusitis/burning sensation with PO intakes. Medications reviewed. Labs reviewed; K: 3.4 mmol/L, BUN <5 mg/dL, Ca: 7 mg/dL, Mg: 1.5 mg/dL, Phos: 1.5 mg/dL, LFTs elevated.     1/19 - No intakes documented since last assessment.  - Pt reports that she has been having burning sensation with certain food and drink items (specifically those that are acidic).  - Notes in chart indicate pt with oral mucusitis.  - She states that she does not tolerate hot or cold items better than the other. - Pt states she has been feeling very hungry but that mucusitis is limiting intakes.  - Physical assessment indicates no muscle or fat wasting.   1/16 - No intakes documented but noted 50% completion of cup of jello on bedside table which pt reports she consumed for breakfast without N/V.  - She was experiencing N/V for 2 days PTA and last episode of emesis was last night (1/15). - Pt states that she has had poor appetite while undergoing chemo and has been experiencing taste alteration (bland taste).  - Pt became drowsy during the end of discussion.  - Pt had a stroke ~1 year ago with no residual deficits in chewing or swallowing.  - Pt was not drinking nutrition supplements PTA.  - She states that she has been losing weight; unable to obtain further information on this from pt .  - Per chart review, pt has lost 2 lbs (1% body weight) over 12 days which is not significant for time frame.  - Also lost 5 lbs (3% body weight)  over the past 3 months which is also not significant for time frame.  - Did not perform physical assessment at this time with respect to pt's comfort but will do so at follow-up and document findings at that time.   Diet Order:  Diet NPO time specified  Skin:  Reviewed, no issues  Last BM:  1/25  Height:   Ht Readings from Last 1 Encounters:   09/24/15 _0  (1.626 m)    Weight:   Wt Readings from Last 1 Encounters:  10/04/15 182 lb 15.7 oz (83 kg)    Ideal Body Weight:  54.54 kg (kg)  BMI:  Body mass index is 31.39 kg/(m^2).  Estimated Nutritional Needs:   Kcal:  3094-0768  Protein:  85-95 grams  Fluid:  >/= 2 L/day  EDUCATION NEEDS:   No education needs identified at this time     Jarome Matin, RD, LDN Inpatient Clinical Dietitian Pager # [phone] After hours/weekend pager # [phone]

## 2015-10-04 NOTE — Progress Notes (Signed)
Upon calling [facility] this am, Meiko informed me that the patient could not have the [place] with elastography done because they are not approved for inpatients, but usually outpatient basis only. She also said that medications could effect this test and that there was no one there to read the test. She said that the doctor who ordered this test (Lori P. Hvozdovic) needs to speak with Dr. Thornton Papas or Dr. Kris Hartmann. Dr. Thornton Papas is typically at [facility] but does not come on until 12pm. Dr. Kris Hartmann is supposed to come on at 9:30am. Keep patient NPO until further notice.   Janell Quiet, RN

## 2015-10-05 LAB — FANA STAINING PATTERNS: CENTROMERE AB: 1:640 {titer}

## 2015-10-05 LAB — ANTINUCLEAR ANTIBODIES, IFA: ANA Ab, IFA: POSITIVE — AB

## 2015-10-05 LAB — ALPHA-1 ANTITRYPSIN PHENOTYPE: A1 ANTITRYPSIN SER: 170 mg/dL (ref 90–200)

## 2015-10-10 ENCOUNTER — Other Ambulatory Visit (HOSPITAL_BASED_OUTPATIENT_CLINIC_OR_DEPARTMENT_OTHER): Payer: Medicare Other

## 2015-10-10 ENCOUNTER — Telehealth: Payer: Self-pay | Admitting: *Deleted

## 2015-10-10 ENCOUNTER — Ambulatory Visit (HOSPITAL_BASED_OUTPATIENT_CLINIC_OR_DEPARTMENT_OTHER): Payer: Medicare Other | Admitting: Nurse Practitioner

## 2015-10-10 ENCOUNTER — Encounter: Payer: Self-pay | Admitting: Nurse Practitioner

## 2015-10-10 ENCOUNTER — Other Ambulatory Visit: Payer: Self-pay | Admitting: *Deleted

## 2015-10-10 ENCOUNTER — Ambulatory Visit: Payer: Medicare Other

## 2015-10-10 ENCOUNTER — Telehealth: Payer: Self-pay | Admitting: Oncology

## 2015-10-10 VITALS — BP 109/67 | HR 70 | Temp 97.5°F | Resp 18 | Ht 64.0 in | Wt 168.6 lb

## 2015-10-10 DIAGNOSIS — C50912 Malignant neoplasm of unspecified site of left female breast: Secondary | ICD-10-CM

## 2015-10-10 DIAGNOSIS — C50412 Malignant neoplasm of upper-outer quadrant of left female breast: Secondary | ICD-10-CM

## 2015-10-10 DIAGNOSIS — C773 Secondary and unspecified malignant neoplasm of axilla and upper limb lymph nodes: Secondary | ICD-10-CM

## 2015-10-10 DIAGNOSIS — C7951 Secondary malignant neoplasm of bone: Secondary | ICD-10-CM

## 2015-10-10 DIAGNOSIS — C7972 Secondary malignant neoplasm of left adrenal gland: Secondary | ICD-10-CM | POA: Diagnosis not present

## 2015-10-10 DIAGNOSIS — C50812 Malignant neoplasm of overlapping sites of left female breast: Secondary | ICD-10-CM

## 2015-10-10 DIAGNOSIS — K7682 Hepatic encephalopathy: Secondary | ICD-10-CM

## 2015-10-10 DIAGNOSIS — G9349 Other encephalopathy: Secondary | ICD-10-CM | POA: Diagnosis not present

## 2015-10-10 DIAGNOSIS — I2699 Other pulmonary embolism without acute cor pulmonale: Secondary | ICD-10-CM

## 2015-10-10 DIAGNOSIS — R7401 Elevation of levels of liver transaminase levels: Secondary | ICD-10-CM

## 2015-10-10 DIAGNOSIS — C50111 Malignant neoplasm of central portion of right female breast: Secondary | ICD-10-CM

## 2015-10-10 DIAGNOSIS — R740 Nonspecific elevation of levels of transaminase and lactic acid dehydrogenase [LDH]: Secondary | ICD-10-CM

## 2015-10-10 DIAGNOSIS — Z452 Encounter for adjustment and management of vascular access device: Secondary | ICD-10-CM

## 2015-10-10 DIAGNOSIS — K7290 Hepatic failure, unspecified without coma: Secondary | ICD-10-CM

## 2015-10-10 LAB — COMPREHENSIVE METABOLIC PANEL
ALBUMIN: 2.2 g/dL — AB (ref 3.5–5.0)
ALT: 116 U/L — AB (ref 0–55)
ANION GAP: 8 meq/L (ref 3–11)
AST: 442 U/L (ref 5–34)
Alkaline Phosphatase: 880 U/L — ABNORMAL HIGH (ref 40–150)
BILIRUBIN TOTAL: 4.28 mg/dL — AB (ref 0.20–1.20)
BUN: 7.9 mg/dL (ref 7.0–26.0)
CALCIUM: 8.8 mg/dL (ref 8.4–10.4)
CO2: 25 mEq/L (ref 22–29)
CREATININE: 0.7 mg/dL (ref 0.6–1.1)
Chloride: 98 mEq/L (ref 98–109)
EGFR: 86 mL/min/{1.73_m2} — AB (ref >90)
Glucose: 98 mg/dl (ref 70–140)
Potassium: 4.1 mEq/L (ref 3.5–5.1)
Sodium: 132 mEq/L — ABNORMAL LOW (ref 136–145)
TOTAL PROTEIN: 5.9 g/dL — AB (ref 6.4–8.3)

## 2015-10-10 LAB — CBC WITH DIFFERENTIAL/PLATELET
BASO%: 1.3 % (ref 0.0–2.0)
BASOS ABS: 0.1 10*3/uL (ref 0.0–0.1)
EOS ABS: 0 10*3/uL (ref 0.0–0.5)
EOS%: 0 % (ref 0.0–7.0)
HEMATOCRIT: 32.7 % — AB (ref 34.8–46.6)
HEMOGLOBIN: 11.3 g/dL — AB (ref 11.6–15.9)
LYMPH%: 23 % (ref 14.0–49.7)
MCH: 35.8 pg — ABNORMAL HIGH (ref 25.1–34.0)
MCHC: 34.6 g/dL (ref 31.5–36.0)
MCV: 103.5 fL — AB (ref 79.5–101.0)
MONO#: 0.9 10*3/uL (ref 0.1–0.9)
MONO%: 12.6 % (ref 0.0–14.0)
NEUT%: 63.1 % (ref 38.4–76.8)
NEUTROS ABS: 4.4 10*3/uL (ref 1.5–6.5)
NRBC: 0 % (ref 0–0)
PLATELETS: 122 10*3/uL — AB (ref 145–400)
RBC: 3.16 10*6/uL — AB (ref 3.70–5.45)
RDW: 15.9 % — AB (ref 11.2–14.5)
WBC: 7 10*3/uL (ref 3.9–10.3)
lymph#: 1.6 10*3/uL (ref 0.9–3.3)

## 2015-10-10 MED ORDER — SODIUM CHLORIDE 0.9% FLUSH
10.0000 mL | INTRAVENOUS | Status: DC | PRN
  Administered 2015-10-10: 10 mL via INTRAVENOUS
  Filled 2015-10-10: qty 10

## 2015-10-10 MED ORDER — HEPARIN SOD (PORK) LOCK FLUSH 100 UNIT/ML IV SOLN
500.0000 [IU] | Freq: Once | INTRAVENOUS | Status: DC
  Filled 2015-10-10: qty 5

## 2015-10-10 MED ORDER — OXYCODONE HCL 5 MG PO TABS: 5.0000 mg | ORAL_TABLET | ORAL | Status: DC | PRN

## 2015-10-10 NOTE — Telephone Encounter (Signed)
Pt confirmed appt for 2/13 and received avs

## 2015-10-10 NOTE — Progress Notes (Signed)
. ID: Maria Pruitt   DOB: 1945-10-15  MR#: [id number]  AQT#:[id number]  [name], PA-C GYN:  SU:  OTHER MD:  CHIEF COMPLAINT: left adrenal metastatsis  CURRENT TREATMENT: capecitabine, denosumab  BREAST CANCER HISTORY:  from the original intake note:  [name] herself palpated a change in her left breast, shortly before her mammogram was due.  She brought this to [name] attention and he set her up for diagnostic mammography on October 31, 2009.   Routine and implant displaced views of both breasts showed a 2 cm irregular mass in the outer left breast, seen only on the spot tangential view.  The breast tissue itself was fatty.  There were no suspicious calcifications.  This mass was palpable to Dr. [name].  An ultrasound showed it to be 1.7 cm irregular and hypoechoic.  There were at least two enlarged left axillary lymph nodes identified.  Dr. [name] recommended biopsy which was performed the same day and showed ([id number]) and invasive breast cancer felt most likely to be ductal with lobular features.  The left axillary lymph node biopsy also showed the same tumor (similar morphologic findings) and both prognostic profiles showed the cancer to be strongly ER and PR positive with a low to borderline proliferation fraction (13 and 14%).  Neither mass showed amplification of Her-2 by CISH.  (The ratios were 1.1 and 0.85.)    With this information, the patient was referred to Dr. [name] and bilateral breast MRIs were obtained March 1st.  This showed several left axillary lymph nodes with cortical thickening, the largest one measuring 1.1 cm.  The mass itself measured 2.9 cm by MRI.  It was spiculated and irregular.  There was no evidence of contralateral disease.   She was treated neoadjuvantly with docetaxel and cyclophosphamide for 6 cycles completed in July 2011, after which she underwent left lumpectomy and axillary dissection August 2011 for a ypT1b yTN3 (14 positive lymph  nodes), Stage IIIC, grade 1 residual tumor. HER-2/neu was repeated, again not amplified. After completing local regional radiation October 2011 she started letrozole November 2011, continued to September 2016, which she was found to have metastatic disease   METASTATIC DISEASE HISTORY: From the 06/08/2015 summary:   [name] returns today for a new problem accompanied by her sister [name]. To summarize the recent history: She had a hemorrhagic stroke in January 2016 with some residuals. She has been living next door to her son in the [place] area since then.--In August she presented to her primary care physician, Dr. Thornton Papas, with a complaint of abdominal discomfort, nausea and vomiting. He treated her for a UTI w/o resolution and obtained a KUB in his office; this was nondiagnostic. Accordingly he set up the patient for CT scan of the abdomen and pelvis 05/06/2015. This showed the left adrenal gland to be enlarged to 7.5 x 4.0 cm. There was eccentric wall thickening of the gastric cardia. There were small lymph nodes in the upper abdomen and retroperitoneum. There was no liver involvement. The right adrenal gland was unremarkable. There was severe left kidney atrophy (congenital).  The patient was then set up for biopsy of the left adrenal mass on 05/30/2015. This showed [name] [id number]) metastatic carcinoma which was cytokeratin 7 positive, with rare cytokeratin 20 positivity and was negative for gross cystic disease fluid protein. The prognostic panel showed this tumor to be estrogen and progesterone receptor negative, HER-2 negative, with an MIB-1 of 90-100%.   PET scan10/03/2015 which showed in additional to the  left adrenal mass, some regional lymph nodes and multiple bone lesions. There was no obvious primary tumor. She also had an EGD to evaluate a possible gastric primary suggested by the yearly a CT scan of the chest. This was normal. We also obtained tumor markerswhich showed a normal CA 19,a  mildly elevated CEA at 9.7,Maria Pruitt is significantly elevated CA-27-29 at 125. Given this data, the most likely interpretation is that we are dealing with an estrogen receptor negative recurrence of her earlier estrogen receptor positive breast cancer, now stage IV  Her subsequent history is as detailed below  INTERVAL HISTORY: Maria Pruitt returns today for follow-up of her metastatic triple negative breast cancer, accompanied by sister. She would have been due for day 1, cycle 2 of eribulin, given on day 1 and day 8 of every 21 day cycle, with neulasta onpro given on day 2 for granulocyte support. This is on hold because she has recently been discharged from the hospital. She presented to the ED on 1/14 with fever and weakness. She was found to have the flu, a pulmonary embolus, and hepatic encephalopathy by the end of her admission 10 days later. She is on lovenox daily for the PE, and hates the injections. She finished up a course of lactulose over the weekend. She is still somewhat disoriented to time. She could not tell me the year. She cannot afford the rifaximin she was prescribed at discharge for hepatic encephalopathy.   REVIEW OF SYSTEMS: Since her discharge, Murlene denies fevers, chills, nausea or vomiting. She is moving her bowels daily. She denies pain. Her appetite is down and she is very fatigued and weak, sleeping most of the day and all night. She is trying to keep her fluids up. She has no mouth sores, rashes, or neuropathy symptoms. She denies shortness of breath, chest pain, cough, or palpitations. She has no headaches, dizziness, or vision changes.  A detailed review of systems is otherwise stable.   PAST MEDICAL HISTORY: Past Medical History  Diagnosis Date  . Hypertension   . Breast cancer ([place]) 2011    Stage 3  . Atrial fibrillation ([place])   . Hypercholesterolemia   . Abnormal ECG   . Allergy     seasonal  . Arthritis   . GERD (gastroesophageal reflux disease)   . 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 Seizures ([place])   . Stroke ([place])     dec 15  . Thyroid disease    1. Significant for pituitary adenoma which was removed in 1983 but recurred, requiring radiation and briefly some Parlodel.  She has been off treatment since 1985.  She has some acromegaly secondary to this tumor.   2. She is status post bilateral submuscular breast implants under Dr. Towanda Malkin in 1987.   3. She is status post C-section for her third child who was hydrocephalic.   4. She underwent rhinoplasty in 1984.   5. She has a history of hypertension. 6. History of mitral valve prolapse, but she says she has not been receiving antibiotics prior to dental or similar procedures.   7. History of hypothyroidism.   8. History of congenital absence of one kidney.   9. History of hyperlipidemia.  10. History of obesity.   11. History of renal stones.   12. History of fatty liver.   13. History of gout.   14. History of palpitations.   History of childhood asthma  PAST SURGICAL HISTORY: Past Surgical  History  Procedure Laterality Date  . Cesarean section  1980  . Breast enhancement surgery  1987  . Umbilical hernia repair    . Tubal ligation    . Scar revision    . Pituitary surgery      adenoma  . Rhinoplasty  1985  . Breast lumpectomy Left   . Colonoscopy      FAMILY HISTORY Family History  Problem Relation Age of Onset  . Heart disease Mother   . Heart attack Father   . Heart attack Brother   . Colon cancer Neg Hx    The patient's father died from a myocardial infarction at the age of 72.  The patient's mother died from complications of congestive heart failure at the age of 68.  The patient had one brother who died from a myocardial infarction at the age of 45.  One of the brothers and two sisters are alive and well.  Sr. [name] is an ICU nurse. There is no history of breast or ovarian cancer in the family to her knowledge.  GYNECOLOGIC HISTORY: She is  GX P3, first pregnancy to term at age 54.  She went through the change of life at the time of her pituitary surgery in 1983.  She took hormone replacement for less than a year because of poor tolerance.    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  She gave up her job in 07-25-2010.  Her first husband died from chronic myeloid leukemia in 1985.  Her three children from that marriage include her son Randall Hiss who died from complications of hydrocephaly at age 36; son [name] who is a Insurance underwriter and son [name] who is an Public house manager in this area.  The patient has five grandchildren.  Her second husband of 20+ years, Ron, is a Company secretary of an independent [employer] and also does Writer and is a Oceanographer. He is now disabled due toa traumatic brain injury. He lost his first wife to endometrial cancer.  He has a daughter from that marriage.  She lives in [place]: not in place  HEALTH MAINTENANCE: Social History  Substance Use Topics  . Smoking status: Never Smoker   . Smokeless tobacco: Never Used  . Alcohol Use: No     Colonoscopy:  PAP:  Bone density: January 2012/ osteopenia  Lipid panel:  Allergies  Allergen Reactions  . Ciprofloxacin Other (See Comments)  . Clonidine Derivatives Other (See Comments)  . Phenytoin     Other reaction(s): Other Elevated LFTs  . Sulfa Antibiotics Hives  . Benzonatate     REACTION: Swelling, tessalon perles; tolerates benadryl  . Codeine     REACTION: Nausea    Current Outpatient Prescriptions  Medication Sig Dispense Refill  . hydrALAZINE (APRESOLINE) 10 MG tablet Take 10 mg by mouth 4 (four) times daily as needed. FOR SBP>200 or DBP>110    . hydrochlorothiazide (HYDRODIURIL) 25 MG tablet Take 1 tablet (25 mg total) by mouth daily.    Marland Kitchen levothyroxine (SYNTHROID, LEVOTHROID) 100 MCG tablet Take 88 mcg by mouth daily before breakfast.    .  lidocaine-prilocaine (EMLA) cream Apply to affected area once 30 g 3  . loratadine (CLARITIN) 10 MG tablet Take 10 mg by mouth daily.    Marland Kitchen ALPRAZolam (XANAX) 0.25 MG tablet Take 0.25 mg by mouth 2 (two) times daily as needed for anxiety. Reported on 09/20/2015    .  Ascorbic Acid (VITAMIN C) 100 MG tablet Take 1,000 mg by mouth daily.     Marland Kitchen aspirin (ASPIRIN CHILDRENS) 81 MG chewable tablet Chew 1 tablet (81 mg total) by mouth daily. 100 tablet 4  . calcium carbonate (TUMS - DOSED IN MG ELEMENTAL CALCIUM) 500 MG chewable tablet Chew 1 tablet (200 mg of elemental calcium total) by mouth 3 (three) times daily. 30 tablet 0  . enoxaparin (LOVENOX) 40 MG/0.4ML injection Inject 0.4 mLs (40 mg total) into the skin daily. 30 Syringe 1  . famotidine (PEPCID) 20 MG tablet Take 1 tablet (20 mg total) by mouth 2 (two) times daily. (Patient not taking: Reported on 09/20/2015) 180 tablet 4  . lactulose (CHRONULAC) 10 GM/15ML solution Take 45 mLs (30 g total) by mouth 2 (two) times daily. 240 mL 0  . losartan (COZAAR) 50 MG tablet Take 50 mg by mouth daily.    . magic mouthwash w/lidocaine SOLN Take 15 mLs by mouth 4 (four) times daily. 100 mL 0  . metoprolol (LOPRESSOR) 50 MG tablet Take 50 mg by mouth 2 (two) times daily.    . Multiple Vitamin (MULTIVITAMIN WITH MINERALS) TABS tablet Take 1 tablet by mouth daily. 30 tablet 0  . ondansetron (ZOFRAN) 8 MG tablet Take 1 tablet (8 mg total) by mouth 2 (two) times daily. Start the day after chemo for 2 days. Then take as needed for nausea or vomiting. 30 tablet 1  . ondansetron (ZOFRAN-ODT) 8 MG disintegrating tablet Take 1 tablet (8 mg total) by mouth 2 (two) times daily as needed for nausea or vomiting. 20 tablet 3  . oxyCODONE (OXY IR/ROXICODONE) 5 MG immediate release tablet Take 1 tablet (5 mg total) by mouth every 4 (four) hours as needed for severe pain. 30 tablet 0  . potassium chloride 20 MEQ TBCR Take 20 mEq by mouth daily. 10 tablet 0  . prochlorperazine  (COMPAZINE) 10 MG tablet Take 1 tablet (10 mg total) by mouth every 6 (six) hours as needed (Nausea or vomiting). (Patient not taking: Reported on 09/20/2015) 30 tablet 1  . rifaximin (XIFAXAN) 550 MG TABS tablet Take 1 tablet (550 mg total) by mouth 2 times daily at 12 noon and 4 pm. 60 tablet 0   Current Facility-Administered Medications  Medication Dose Route Frequency Provider Last Rate Last Dose  . heparin lock flush 100 unit/mL  500 Units Intravenous Once Laurie Panda, NP      . sodium chloride flush (NS) 0.9 % injection 10 mL  10 mL Intravenous PRN Laurie Panda, NP   10 mL at 10/10/15 1349   Facility-Administered Medications Ordered in Other Visits  Medication Dose Route Frequency Provider Last Rate Last Dose  . 0.9 %  sodium chloride infusion   Intravenous Once [name], MD        OBJECTIVE: Middle-aged white woman in no acute distress, seated in wheelchair Filed Vitals:   10/10/15 1322  BP: 109/67  Pulse: 70  Temp: 97.5 F (36.4 C)  Resp: 18     Body mass index is 28.93 kg/(m^2).    ECOG FS: 1  Sclerae unicteric, pupils round and equal Oropharynx clear and moist-- no thrush or other lesions No cervical or supraclavicular adenopathy Lungs no rales or rhonchi Heart regular rate and rhythm Abd soft, nontender, positive bowel sounds MSK no focal spinal tenderness, no upper extremity lymphedema Neuro: nonfocal, not oriented to time, appropriate affect Breasts: deferred   LAB RESULTS: CBC Latest Ref Rng 10/10/2015  10/03/2015 10/02/2015  WBC 3.9 - 10.3 10e3/uL 7.0 4.4 3.4(L)  Hemoglobin 11.6 - 15.9 g/dL 11.3(L) 10.1(L) 10.2(L)  Hematocrit 34.8 - 46.6 % 32.7(L) 28.5(L) 29.8(L)  Platelets 145 - 400 10e3/uL 122(L) 137(L) 141(L)     CMP Latest Ref Rng 10/10/2015 10/04/2015 10/03/2015  Glucose 70 - 140 mg/dl 98 - 484(R)  BUN 7.0 - 26.0 mg/dL 7.9 - <4(M)  Creatinine 0.6 - 1.1 mg/dL 0.7 - 3.55  Sodium 997 - 145 mEq/L 132(L) - 140  Potassium 3.5 - 5.1 mEq/L 4.1 -  3.4(L)  Chloride 101 - 111 mmol/L - - 103  CO2 22 - 29 mEq/L 25 - 29  Calcium 8.4 - 10.4 mg/dL 8.8 - 7.0(L)  Total Protein 6.4 - 8.3 g/dL 5.9(L) 5.4(L) -  Total Bilirubin 0.20 - 1.20 mg/dL 6.82(RT) 4.6(H) -  Alkaline Phos 40 - 150 U/L 880(H) 502(H) -  AST 5 - 34 U/L 442(HH) 153(H) -  ALT 0 - 55 U/L 116(H) 90(H) -    STUDIES: Dg Chest 2 View  09/27/2015  CLINICAL DATA:  Fever, shortness of breath and productive cough. Recently diagnosed pulmonary embolism. EXAM: CHEST  2 VIEW COMPARISON:  Previous examinations. FINDINGS: Stable enlarged cardiac silhouette. Stable right jugular porta catheter. Left breast implant with capsular calcifications. Left breast and left axillary surgical clips. Clear lungs. Diffuse osteopenia. IMPRESSION: No acute abnormality. Electronically Signed   By: Beckie Salts M.D.   On: 09/27/2015 15:31   Ct Angio Chest Pe W/cm &/or Wo Cm  09/26/2015  CLINICAL DATA:  Inpatient. Shortness of breath. Metastatic breast cancer. EXAM: CT ANGIOGRAPHY CHEST WITH CONTRAST TECHNIQUE: Multidetector CT imaging of the chest was performed using the standard protocol during bolus administration of intravenous contrast. Multiplanar CT image reconstructions and MIPs were obtained to evaluate the vascular anatomy. CONTRAST:  OMNIPAQUE IOHEXOL 350 MG/ML SOLN COMPARISON:  06/17/2015 PET-CT . FINDINGS: Mediastinum/Nodes: The study is high quality for the evaluation of pulmonary embolism. There is an acute subsegmental pulmonary embolus in the left lower lobe (series 7/ image 135). No additional pulmonary emboli. Mildly atherosclerotic nonaneurysmal thoracic aorta. Dilated main pulmonary artery (3.5 cm diameter), not appreciably changed from 06/17/2015. Normal heart size. No CT evidence of right heart strain. No pericardial fluid/thickening. Right internal jugular MediPort terminates at the cavoatrial junction. Normal visualized thyroid. Normal esophagus. Left axillary surgical clips are again  noted. No axillary adenopathy. There are new mildly enlarged right supraclavicular lymph nodes, largest 1.0 cm (series 5/image 6). There is a mildly enlarged 1.0 cm right paratracheal node (series 5/image 22), mildly increased from 0.9 cm. No additional pathologically enlarged mediastinal or hilar nodes. Lungs/Pleura: No pneumothorax. No pleural effusion. Stable 4 mm anterior right lower lobe pulmonary nodule (series 8/ image 45). Subcentimeter calcified granuloma in the right lower lobe. Stable sharply marginated reticulation and ground-glass opacity in the anterior left upper lobe in keeping with radiation fibrosis. No acute consolidative airspace disease or new significant pulmonary nodules. Upper abdomen: Small hiatal hernia. Partially visualized left adrenal metastasis. Musculoskeletal: Sclerotic lesion in the T9 vertebral body appears slightly increased in size. Mild-to-moderate degenerative changes in the thoracic spine. Intact appearing bilateral breast prostheses. Review of the MIP images confirms the above findings. IMPRESSION: 1. Acute subsegmental left lower lobe pulmonary embolus. 2. Stable chronic main pulmonary artery dilation, suggesting chronic pulmonary arterial hypertension. 3. No CT evidence of right heart strain. 4. New mild right supraclavicular and right paratracheal mediastinal lymphadenopathy, suspicious for metastatic disease. 5. Slight growth of T9 vertebral  body sclerotic lesion, probably a bone metastasis . 6. Partially visualized left adrenal metastasis. 7. Stable tiny right lower lobe pulmonary nodule. Critical Value/emergent results were called by telephone at the time of interpretation on 09/26/2015 at 11:05 am to Dr. Florencia Reasons , who verbally acknowledged these results. Electronically Signed   By: Ilona Sorrel M.D.   On: 09/26/2015 11:13   US Abdomen Complete  09/24/2015  CLINICAL DATA:  Transaminitis for labs.  Initial encounter. EXAM: ABDOMEN ULTRASOUND COMPLETE COMPARISON:  MRI of  the abdomen performed 08/23/2015 FINDINGS: Gallbladder: No gallstones or wall thickening visualized. Mild internal echoes within the gallbladder may reflect mild sludge. No sonographic Murphy sign noted by sonographer. Common bile duct: Diameter: 0.3 cm, within normal limits in caliber. Liver: No focal lesion identified. There is a mildly nodular contour to the liver, raising concern for some degree of hepatic cirrhosis. Within normal limits in parenchymal echogenicity. IVC: Not well characterized due to overlying structures. Pancreas: Visualized portion unremarkable. Spleen: Size and appearance within normal limits. Right Kidney: Length: 14.6 cm. Echogenicity within normal limits. No mass or hydronephrosis visualized. Left Kidney:  Congenitally absent, per patient. Abdominal aorta: Not well characterized distally due to overlying bowel gas. No evidence of aneurysmal dilatation. Other findings: None. IMPRESSION: 1. Mildly nodular contour of the liver raises concern for some degree of hepatic cirrhosis, which may explain the patient's transaminitis. 2. No focal hepatic lesions seen on ultrasound, though if the patient's transaminitis significantly worsens, dynamic liver protocol MRI or CT could be considered for further evaluation. 3. Mild internal echoes within the gallbladder may reflect mild sludge. Gallbladder otherwise unremarkable. Electronically Signed   By: Garald Balding M.D.   On: 09/24/2015 19:06   Mr 3d Recon At Scanner  10/02/2015  CLINICAL DATA:  Metastatic breast cancer. EXAM: MRI ABDOMEN WITHOUT AND WITH CONTRAST (INCLUDING MRCP) TECHNIQUE: Multiplanar multisequence MR imaging of the abdomen was performed both before and after the administration of intravenous contrast. Heavily T2-weighted images of the biliary and pancreatic ducts were obtained, and three-dimensional MRCP images were rendered by post processing. CONTRAST:  68m MULTIHANCE GADOBENATE DIMEGLUMINE 529 MG/ML IV SOLN COMPARISON:  MRI  08/23/2015 FINDINGS: Examination is limited by breathing motion artifact. Lower chest: No obvious lung base lesions or pleural effusion. No pericardial effusion. Bilateral breast prosthesis noted. Hepatobiliary: Stable cirrhotic changes involving the liver. No early arterial phase enhancing lesions to suggest dysplastic nodule or HCC. No intrahepatic biliary dilatation. Normal caliber and course of the common bile duct. Normal gallbladder. Pancreas: No pancreatic mass, inflammation or ductal dilatation. Spleen: Normal size.  No focal lesions. Adrenals/Urinary Tract: The left adrenal gland mass is smaller. It previously measured 43.5 x 20.5 mm and now measures 35 x 11.5 mm. 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 The right kidney demonstrates compensatory hypertrophy. No renal lesions or hydronephrosis. Stomach/Bowel: The stomach, duodenum, visualized small bowel and visualize colon are grossly normal. Vascular/Lymphatic: Persistent but improved retroperitoneal lymphadenopathy. Largest nodal mass between the atretic left kidney and the aorta measures 30.5 x 25.5 mm on series 1102, image 60. This previously measured 47 x 39 mm. The aorta and branch vessels are patent. The major venous structures are patent. Other: No ascites. Stable anterior abdominal wall hernia containing fat. Musculoskeletal: No significant change in L2 vertebral body lesion. No new osseous lesions. IMPRESSION: 1. Stable cirrhotic changes involving the liver. No worrisome hepatic lesions. 2. Interval decrease in size of the left adrenal mass and retroperitoneal lymphadenopathy. 3. Stable L2 metastatic lesion. Electronically  Signed   By: Marijo Sanes M.D.   On: 10/02/2015 13:53   Dg Chest Port 1 View  09/24/2015  CLINICAL DATA:  Fever.  Breast carcinoma. EXAM: PORTABLE CHEST 1 VIEW COMPARISON:  Jan 16, 2011 FINDINGS: Port-A-Cath tip is in the superior vena cava. No pneumothorax. There is ill-defined opacity in the right upper lobe, concerning for  pneumonia. Lungs elsewhere appear clear. Heart is upper normal in size with pulmonary vascularity within normal limits. No apparent adenopathy. There is postoperative change on the left with clips in left axillary region. There are breast implants bilaterally. No bone lesions. IMPRESSION: Ill-defined opacity right upper lobe, concerning for pneumonia. Lungs elsewhere appear clear. No adenopathy apparent. Electronically Signed   By: Lowella Grip III M.D.   On: 09/24/2015 15:45   Mr Abd W/wo Cm/mrcp  10/02/2015  CLINICAL DATA:  Metastatic breast cancer. EXAM: MRI ABDOMEN WITHOUT AND WITH CONTRAST (INCLUDING MRCP) TECHNIQUE: Multiplanar multisequence MR imaging of the abdomen was performed both before and after the administration of intravenous contrast. Heavily T2-weighted images of the biliary and pancreatic ducts were obtained, and three-dimensional MRCP images were rendered by post processing. CONTRAST:  41m MULTIHANCE GADOBENATE DIMEGLUMINE 529 MG/ML IV SOLN COMPARISON:  MRI 08/23/2015 FINDINGS: Examination is limited by breathing motion artifact. Lower chest: No obvious lung base lesions or pleural effusion. No pericardial effusion. Bilateral breast prosthesis noted. Hepatobiliary: Stable cirrhotic changes involving the liver. No early arterial phase enhancing lesions to suggest dysplastic nodule or HCC. No intrahepatic biliary dilatation. Normal caliber and course of the common bile duct. Normal gallbladder. Pancreas: No pancreatic mass, inflammation or ductal dilatation. Spleen: Normal size.  No focal lesions. Adrenals/Urinary Tract: The left adrenal gland mass is smaller. It previously measured 43.5 x 20.5 mm and now measures 35 x 11.5 mm. 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 The right kidney demonstrates compensatory hypertrophy. No renal lesions or hydronephrosis. Stomach/Bowel: The stomach, duodenum, visualized small bowel and visualize colon are grossly normal. Vascular/Lymphatic: Persistent but  improved retroperitoneal lymphadenopathy. Largest nodal mass between the atretic left kidney and the aorta measures 30.5 x 25.5 mm on series 1102, image 60. This previously measured 47 x 39 mm. The aorta and branch vessels are patent. The major venous structures are patent. Other: No ascites. Stable anterior abdominal wall hernia containing fat. Musculoskeletal: No significant change in L2 vertebral body lesion. No new osseous lesions. IMPRESSION: 1. Stable cirrhotic changes involving the liver. No worrisome hepatic lesions. 2. Interval decrease in size of the left adrenal mass and retroperitoneal lymphadenopathy. 3. Stable L2 metastatic lesion. Electronically Signed   By: PMarijo SanesM.D.   On: 10/02/2015 13:53    ASSESSMENT: 70[place]  (1) status post left breast biopsy in February of 2011 for an invasive lobular carcinoma measuring 2.9 cm by MRI, with a positive prechemotherapy axillary lymph node biopsy, strongly estrogen and progesterone receptor positive with no evidence of HER-2/neu amplification and an MIB-1 of 14%.   (2) received 6 cycles of docetaxel/ cyclophosphamide in the neoadjuvant setting, completed in mid July of 2011   (3) s/p left lumpectomy and axillary lymph node dissection in August of 2011 for a ypT1b yTN3 (14 positive lymph nodes), Stage IIIC, grade 1 residual tumor. HER-2/neu was repeated, again not amplified.   (4) Completed loco-regional radiation in late October of 2011   (5) on letrozole starting early November 2011, continued to October 2016, with progression  METASTATIC DISEASE: September 2016: Involving left adrenal gland, regional  nodes and bones  (6) biopsy of a 7.5 cm left adrenal mass 05/30/2015 showed metastatic carcinoma, estrogen, progesterone, and HER-2 negative, with an MIB-1 of 90-100%; the tumor cells were cytokeratin 7 positive, cytokeratin 20 focally positive, gross cystic disease fluid protein negative  (a) the CA-27-29  is informative  (b) PET scan 06/17/2015 shows, in addition to the left adrenal mass, some periaortic adenopathy and multiple hypermetabolic bone metastases (L2, T9, T2 and others)  (c) EGD on 06/16/2015 to evaluate suspicious cardiac wall thickening showed no evidence of malignancy  (7) capecitabine started 07/04/2015 at 1.5 g BID 7/7. Discontinued 08/29/15 with progression  (8) denosumab/ Xgeva started 07/18/2015, repeated monthly  (9) eribulin D1/D8 q 21 days started 09/23/14  (10) hospitalized for infuenza A on 09/26/15. Pulmonary embolus found. Lovenox started daily.  (11) hepatic encephalopathy during hospital stay in January 2017. Lactulose completed. Awaiting approval of rifaximin due to financial strain.   OTHER PROBLEMS: (a) the patient is status post hemorrhagic CVA January 2016  (b) congenital absence of left kidney   PLAN:  We are holding treatment this week to allow Herlinda to recover. The labs were reviewed in detail and she has critical levels of her liver function tests. These were reviewed with Dr. Jana Hakim, who believes they will trend back down with time. Her anemia is only borderline, and she denies excessive bruising or bleeding despite a low platelet count and lovexnox use. She will continue on the lovenox daily.   I am working to see if we can get coverage of rifaximin for her hepatic encephalopathy. Right now this proves difficult, because she is not eligible for their patient assistance program because she is on Medicare, and other funds specify for Hep C use only.  Josilynn will return in 1 week for cycle 2 of erbulin. At this time we will lower the dose for better tolerance. She understands and agrees with this plan. She knows the goal of treatment in her case is control. She has been encouraged to call with any issues that might arise before her next visit here.    Laurie Panda, NP 10/10/2015 3:19 PM

## 2015-10-10 NOTE — Telephone Encounter (Signed)
Per staff message and POF I have scheduled appts. Advised scheduler of appts. JMW  

## 2015-10-11 LAB — HEPATIC FUNCTION PANEL
ALT: 113 IU/L — AB (ref 0–32)
AST (SGOT): 425 IU/L — ABNORMAL HIGH (ref 0–40)
Albumin, Serum: 2.4 g/dL — ABNORMAL LOW (ref 3.6–4.8)
Alkaline Phosphatase, S: 849 IU/L — ABNORMAL HIGH (ref 39–117)
BILIRUBIN TOTAL: 4.1 mg/dL — AB (ref 0.0–1.2)
Bilirubin, Direct: 3.21 mg/dL — ABNORMAL HIGH (ref 0.00–0.40)
Total Protein: 5.5 g/dL — ABNORMAL LOW (ref 6.0–8.5)

## 2015-10-11 LAB — CANCER ANTIGEN 27-29 (PARALLEL TESTING): CA 27.29: 163 U/mL — AB (ref 0–39)

## 2015-10-11 LAB — AMMONIA

## 2015-10-11 LAB — CANCER ANTIGEN 27.29: CA 27.29: 178.1 U/mL — ABNORMAL HIGH (ref 0.0–38.6)

## 2015-10-14 ENCOUNTER — Telehealth: Payer: Self-pay | Admitting: *Deleted

## 2015-10-14 NOTE — Telephone Encounter (Signed)
"  Sister Olean Ree calling to see if appointment 10-17-2015 is still needed.  Saw heather Monday 10-10-2015." "Also asked for ammonia level.  With hospitalization, this level was to high and would like to know." Ammonia test not performed.  Lab Core commented an interfering substance present.  Instructed to let lab tech know this with next ammonia test for them to make sure medical technologist have correct tube and this is placed on ice.   "When she comes on Monday we want Ammonia level checked with cbc and cmet."  Will notify providers.  Return number [phone].

## 2015-10-14 NOTE — Progress Notes (Signed)
Given the significant decline in Maria Pruitt's performance status because of her cirrhosis, which appears not to be related to her tumor, the last thing we want to do is make her situation worse. At the same time she has been having some response to eribulin. 4 the current cycle we are going to do a drastic dose reduction to about 40% of the initial dose and we will follow closely how she manages symptomatically not just with the chemotherapy but also with her other medical problems.

## 2015-10-17 ENCOUNTER — Encounter: Payer: Self-pay | Admitting: Nurse Practitioner

## 2015-10-17 ENCOUNTER — Other Ambulatory Visit (HOSPITAL_BASED_OUTPATIENT_CLINIC_OR_DEPARTMENT_OTHER): Payer: Medicare Other

## 2015-10-17 ENCOUNTER — Ambulatory Visit (HOSPITAL_BASED_OUTPATIENT_CLINIC_OR_DEPARTMENT_OTHER): Payer: Medicare Other | Admitting: Nurse Practitioner

## 2015-10-17 ENCOUNTER — Ambulatory Visit: Payer: Medicare Other

## 2015-10-17 ENCOUNTER — Telehealth: Payer: Self-pay | Admitting: Oncology

## 2015-10-17 VITALS — BP 121/87 | HR 64 | Temp 97.9°F | Resp 18 | Ht 64.0 in | Wt 166.3 lb

## 2015-10-17 DIAGNOSIS — C50412 Malignant neoplasm of upper-outer quadrant of left female breast: Secondary | ICD-10-CM

## 2015-10-17 DIAGNOSIS — R059 Cough, unspecified: Secondary | ICD-10-CM

## 2015-10-17 DIAGNOSIS — C7972 Secondary malignant neoplasm of left adrenal gland: Secondary | ICD-10-CM | POA: Diagnosis not present

## 2015-10-17 DIAGNOSIS — E278 Other specified disorders of adrenal gland: Secondary | ICD-10-CM

## 2015-10-17 DIAGNOSIS — C50912 Malignant neoplasm of unspecified site of left female breast: Secondary | ICD-10-CM

## 2015-10-17 DIAGNOSIS — R05 Cough: Secondary | ICD-10-CM | POA: Diagnosis not present

## 2015-10-17 DIAGNOSIS — K7682 Hepatic encephalopathy: Secondary | ICD-10-CM

## 2015-10-17 DIAGNOSIS — C7951 Secondary malignant neoplasm of bone: Secondary | ICD-10-CM | POA: Diagnosis not present

## 2015-10-17 DIAGNOSIS — C50812 Malignant neoplasm of overlapping sites of left female breast: Secondary | ICD-10-CM

## 2015-10-17 DIAGNOSIS — C773 Secondary and unspecified malignant neoplasm of axilla and upper limb lymph nodes: Secondary | ICD-10-CM | POA: Diagnosis not present

## 2015-10-17 DIAGNOSIS — Z171 Estrogen receptor negative status [ER-]: Secondary | ICD-10-CM | POA: Diagnosis not present

## 2015-10-17 DIAGNOSIS — Z8673 Personal history of transient ischemic attack (TIA), and cerebral infarction without residual deficits: Secondary | ICD-10-CM

## 2015-10-17 DIAGNOSIS — K7290 Hepatic failure, unspecified without coma: Secondary | ICD-10-CM

## 2015-10-17 LAB — CBC WITH DIFFERENTIAL/PLATELET
BASO%: 0.8 % (ref 0.0–2.0)
BASOS ABS: 0 10*3/uL (ref 0.0–0.1)
EOS%: 0.2 % (ref 0.0–7.0)
Eosinophils Absolute: 0 10*3/uL (ref 0.0–0.5)
HCT: 33.3 % — ABNORMAL LOW (ref 34.8–46.6)
HEMOGLOBIN: 11.1 g/dL — AB (ref 11.6–15.9)
LYMPH%: 31.6 % (ref 14.0–49.7)
MCH: 36.2 pg — AB (ref 25.1–34.0)
MCHC: 33.3 g/dL (ref 31.5–36.0)
MCV: 108.5 fL — ABNORMAL HIGH (ref 79.5–101.0)
MONO#: 0.7 10*3/uL (ref 0.1–0.9)
MONO%: 13.5 % (ref 0.0–14.0)
NEUT#: 2.8 10*3/uL (ref 1.5–6.5)
NEUT%: 53.9 % (ref 38.4–76.8)
Platelets: 174 10*3/uL (ref 145–400)
RBC: 3.07 10*6/uL — AB (ref 3.70–5.45)
RDW: 16.5 % — AB (ref 11.2–14.5)
WBC: 5.1 10*3/uL (ref 3.9–10.3)
lymph#: 1.6 10*3/uL (ref 0.9–3.3)

## 2015-10-17 LAB — COMPREHENSIVE METABOLIC PANEL
ALBUMIN: 2.4 g/dL — AB (ref 3.5–5.0)
ALK PHOS: 775 U/L — AB (ref 40–150)
ALT: 132 U/L — ABNORMAL HIGH (ref 0–55)
ANION GAP: 8 meq/L (ref 3–11)
AST: 338 U/L (ref 5–34)
BILIRUBIN TOTAL: 3.22 mg/dL — AB (ref 0.20–1.20)
BUN: 11.1 mg/dL (ref 7.0–26.0)
CALCIUM: 9.1 mg/dL (ref 8.4–10.4)
CO2: 26 mEq/L (ref 22–29)
Chloride: 101 mEq/L (ref 98–109)
Creatinine: 0.7 mg/dL (ref 0.6–1.1)
EGFR: 83 mL/min/{1.73_m2} — AB (ref >90)
GLUCOSE: 90 mg/dL (ref 70–140)
POTASSIUM: 3.8 meq/L (ref 3.5–5.1)
Sodium: 135 mEq/L — ABNORMAL LOW (ref 136–145)
TOTAL PROTEIN: 6.5 g/dL (ref 6.4–8.3)

## 2015-10-17 NOTE — Progress Notes (Signed)
. Maria Pruitt   DOB: March 16, 1946  MR#: [id number]  KZL#:[id number]  Maria Nova, PA-C GYN:  SU:  OTHER MD:  CHIEF COMPLAINT: left adrenal metastatsis  CURRENT TREATMENT: capecitabine, denosumab  BREAST CANCER HISTORY:  from the original intake note:  Maria Pruitt herself palpated a change in her left breast, shortly before her mammogram was due.  She brought this to Maria Pruitt attention and he set her up for diagnostic mammography on October 31, 2009.   Routine and implant displaced views of both breasts showed a 2 cm irregular mass in the outer left breast, seen only on the spot tangential view.  The breast tissue itself was fatty.  There were no suspicious calcifications.  This mass was palpable to Maria Pruitt.  An ultrasound showed it to be 1.7 cm irregular and hypoechoic.  There were at least two enlarged left axillary lymph nodes identified.  Maria Pruitt recommended biopsy which was performed the same day and showed ([id number]) and invasive breast cancer felt most likely to be ductal with lobular features.  The left axillary lymph node biopsy also showed the same tumor (similar morphologic findings) and both prognostic profiles showed the cancer to be strongly ER and PR positive with a low to borderline proliferation fraction (13 and 14%).  Neither mass showed amplification of Her-2 by CISH.  (The ratios were 1.1 and 0.85.)    With this information, the patient was referred to Maria Pruitt and bilateral breast MRIs were obtained March 1st.  This showed several left axillary lymph nodes with cortical thickening, the largest one measuring 1.1 cm.  The mass itself measured 2.9 cm by MRI.  It was spiculated and irregular.  There was no evidence of contralateral disease.   She was treated neoadjuvantly with docetaxel and cyclophosphamide for 6 cycles completed in July 2011, after which she underwent left lumpectomy and axillary dissection August 2011 for a ypT1b yTN3 (14 positive lymph  nodes), Stage IIIC, grade 1 residual tumor. HER-2/neu was repeated, again not amplified. After completing local regional radiation October 2011 she started letrozole November 2011, continued to September 2016, which she was found to have metastatic disease   METASTATIC DISEASE HISTORY: From the 06/08/2015 summary:   Maria Pruitt returns today for a new problem accompanied by her sister Maria Pruitt. To summarize the recent history: She had a hemorrhagic stroke in January 2016 with some residuals. She has been living next door to her son in the [place] area since then.--In August she presented to her primary care physician, Maria Pruitt, with a complaint of abdominal discomfort, nausea and vomiting. He treated her for a UTI w/o resolution and obtained a KUB in his office; this was nondiagnostic. Accordingly he set up the patient for CT scan of the abdomen and pelvis 05/06/2015. This showed the left adrenal gland to be enlarged to 7.5 x 4.0 cm. There was eccentric wall thickening of the gastric cardia. There were small lymph nodes in the upper abdomen and retroperitoneum. There was no liver involvement. The right adrenal gland was unremarkable. There was severe left kidney atrophy (congenital).  The patient was then set up for biopsy of the left adrenal mass on 05/30/2015. This showed Maria Pruitt [id number]) metastatic carcinoma which was cytokeratin 7 positive, with rare cytokeratin 20 positivity and was negative for gross cystic disease fluid protein. The prognostic panel showed this tumor to be estrogen and progesterone receptor negative, HER-2 negative, with an MIB-1 of 90-100%.   PET scan10/03/2015 which showed in additional to the  left adrenal mass, some regional lymph nodes and multiple bone lesions. There was no obvious primary tumor. She also had an EGD to evaluate a possible gastric primary suggested by the yearly a CT scan of the chest. This was normal. We also obtained tumor markerswhich showed a normal CA 19,a  mildly elevated CEA at 9.7,Maria Pruitt is significantly elevated CA-27-29 at 125. Given this data, the most likely interpretation is that we are dealing with an estrogen receptor negative recurrence of her earlier estrogen receptor positive breast cancer, now stage IV  Her subsequent history is as detailed below  INTERVAL HISTORY: Maria Pruitt returns today for follow-up of her metastatic triple negative breast cancer, accompanied by her son Maria Pruitt. She would have been due for day 1, cycle 2 of eribulin, given on day 1 and day 8 of every 21 day cycle, with neulasta onpro given on day 2 for granulocyte support.This continues to be on hold as she recovers from a recent hospital admission.   REVIEW OF SYSTEMS: Maria Pruitt is having a better week. She feels stronger and less fatigued, but this is a slow process. She had mild nausea managed with crackers, no meds. She is moving her bowels daily. She is eating better and drinking well. She is in no pain. She has a persistent cough with clear mucus production, but denies fevers or chills. She has been using robitussin PRN with success. She is short of breath with exertion, but denies chest pain, or palpitations. She is sleeping well at night. She has no headaches, dizziness, or vision changes. She is still disoriented to time, but is up to speed with all other questions. A detailed review of systems is otherwise stable.  PAST MEDICAL HISTORY: Past Medical History  Diagnosis Date  . Hypertension   . Breast cancer ([place]) 2011    Stage 3  . Atrial fibrillation ([place])   . Hypercholesterolemia   . Abnormal ECG   . Allergy     seasonal  . Arthritis   . GERD (gastroesophageal reflux disease)   . 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 Seizures ([place])   . Stroke ([place])     dec 15  . Thyroid disease    1. Significant for pituitary adenoma which was removed in 1983 but recurred, requiring radiation and briefly some Parlodel.  She has been off treatment  since 1985.  She has some acromegaly secondary to this tumor.   2. She is status post bilateral submuscular breast implants under Dr. Towanda Malkin in 1987.   3. She is status post C-section for her third child who was hydrocephalic.   4. She underwent rhinoplasty in 1984.   5. She has a history of hypertension. 6. History of mitral valve prolapse, but she says she has not been receiving antibiotics prior to dental or similar procedures.   7. History of hypothyroidism.   8. History of congenital absence of one kidney.   9. History of hyperlipidemia.  10. History of obesity.   11. History of renal stones.   12. History of fatty liver.   13. History of gout.   14. History of palpitations.   History of childhood asthma  PAST SURGICAL HISTORY: Past Surgical History  Procedure Laterality Date  . Cesarean section  1980  . Breast enhancement surgery  1987  . Umbilical hernia repair    . Tubal ligation    . Scar revision    . Pituitary surgery  adenoma  . Rhinoplasty  1985  . Breast lumpectomy Left   . Colonoscopy      FAMILY HISTORY Family History  Problem Relation Age of Onset  . Heart disease Mother   . Heart attack Father   . Heart attack Brother   . Colon cancer Neg Hx    The patient's father died from a myocardial infarction at the age of 109.  The patient's mother died from complications of congestive heart failure at the age of 22.  The patient had one brother who died from a myocardial infarction at the age of 87.  One of the brothers and two sisters are alive and well.  Sr. Maria Pruitt is an ICU nurse. There is no history of breast or ovarian cancer in the family to her knowledge.  GYNECOLOGIC HISTORY: She is GX P3, first pregnancy to term at age 58.  She went through the change of life at the time of her pituitary surgery in 1983.  She took hormone replacement for less than a year because of poor tolerance.    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  She gave up her job in 08/16/10.  Her first husband died from chronic myeloid leukemia in 1985.  Her three children from that marriage include her son Randall Hiss who died from complications of hydrocephaly at age 52; son [name] who is a Insurance underwriter and son Maria Pruitt who is an Public house manager in this area.  The patient has five grandchildren.  Her second husband of 20+ years, Ron, is a Company secretary of an independent [employer] and also does Writer and is a Oceanographer. He is now disabled due toa traumatic brain injury. He lost his first wife to endometrial cancer.  He has a daughter from that marriage.  She lives in [place]: not in place  HEALTH MAINTENANCE: Social History  Substance Use Topics  . Smoking status: Never Smoker   . Smokeless tobacco: Never Used  . Alcohol Use: No     Colonoscopy:  PAP:  Bone density: January 2012/ osteopenia  Lipid panel:  Allergies  Allergen Reactions  . Ciprofloxacin Other (See Comments)  . Clonidine Derivatives Other (See Comments)  . Phenytoin     Other reaction(s): Other Elevated LFTs  . Sulfa Antibiotics Hives  . Benzonatate     REACTION: Swelling, tessalon perles; tolerates benadryl  . Codeine     REACTION: Nausea    Current Outpatient Prescriptions  Medication Sig Dispense Refill  . ALPRAZolam (XANAX) 0.25 MG tablet Take 0.25 mg by mouth 2 (two) times daily as needed for anxiety. Reported on 09/20/2015    . Ascorbic Acid (VITAMIN C) 100 MG tablet Take 1,000 mg by mouth daily.     Marland Kitchen aspirin (ASPIRIN CHILDRENS) 81 MG chewable tablet Chew 1 tablet (81 mg total) by mouth daily. 100 tablet 4  . calcium carbonate (TUMS - DOSED IN MG ELEMENTAL CALCIUM) 500 MG chewable tablet Chew 1 tablet (200 mg of elemental calcium total) by mouth 3 (three) times daily. 30 tablet 0  . enoxaparin (LOVENOX) 40 MG/0.4ML injection Inject 0.4 mLs (40 mg total) into the skin daily. 30 Syringe 1   . hydrALAZINE (APRESOLINE) 10 MG tablet Take 10 mg by mouth 4 (four) times daily as needed. FOR SBP>200 or DBP>110    . hydrochlorothiazide (HYDRODIURIL) 25 MG tablet Take 1 tablet (25 mg total) by mouth daily.    Marland Kitchen  lactulose (CHRONULAC) 10 GM/15ML solution Take 45 mLs (30 g total) by mouth 2 (two) times daily. 240 mL 0  . levothyroxine (SYNTHROID, LEVOTHROID) 100 MCG tablet Take 88 mcg by mouth daily before breakfast.    . lidocaine-prilocaine (EMLA) cream Apply to affected area once 30 g 3  . loratadine (CLARITIN) 10 MG tablet Take 10 mg by mouth daily.    Marland Kitchen losartan (COZAAR) 50 MG tablet Take 50 mg by mouth daily.    . metoprolol (LOPRESSOR) 50 MG tablet Take 50 mg by mouth 2 (two) times daily.    . Multiple Vitamin (MULTIVITAMIN WITH MINERALS) TABS tablet Take 1 tablet by mouth daily. 30 tablet 0  . potassium chloride 20 MEQ TBCR Take 20 mEq by mouth daily. 10 tablet 0  . rifaximin (XIFAXAN) 550 MG TABS tablet Take 1 tablet (550 mg total) by mouth 2 times daily at 12 noon and 4 pm. 60 tablet 0  . famotidine (PEPCID) 20 MG tablet Take 1 tablet (20 mg total) by mouth 2 (two) times daily. (Patient not taking: Reported on 09/20/2015) 180 tablet 4  . magic mouthwash w/lidocaine SOLN Take 15 mLs by mouth 4 (four) times daily. (Patient not taking: Reported on 10/17/2015) 100 mL 0  . ondansetron (ZOFRAN) 8 MG tablet Take 1 tablet (8 mg total) by mouth 2 (two) times daily. Start the day after chemo for 2 days. Then take as needed for nausea or vomiting. (Patient not taking: Reported on 10/17/2015) 30 tablet 1  . ondansetron (ZOFRAN-ODT) 8 MG disintegrating tablet Take 1 tablet (8 mg total) by mouth 2 (two) times daily as needed for nausea or vomiting. (Patient not taking: Reported on 10/17/2015) 20 tablet 3  . oxyCODONE (OXY IR/ROXICODONE) 5 MG immediate release tablet Take 1 tablet (5 mg total) by mouth every 4 (four) hours as needed for severe pain. (Patient not taking: Reported on 10/17/2015) 30 tablet 0  .  prochlorperazine (COMPAZINE) 10 MG tablet Take 1 tablet (10 mg total) by mouth every 6 (six) hours as needed (Nausea or vomiting). (Patient not taking: Reported on 09/20/2015) 30 tablet 1   No current facility-administered medications for this visit.   Facility-Administered Medications Ordered in Other Visits  Medication Dose Route Frequency Provider Last Rate Last Dose  . 0.9 %  sodium chloride infusion   Intravenous Once Maria Cruel, MD        OBJECTIVE: Middle-aged white woman in no acute distress, seated in wheelchair Filed Vitals:   10/17/15 1049  BP: 121/87  Pulse: 64  Temp: 97.9 F (36.6 C)  Resp: 18     Body mass index is 28.53 kg/(m^2).    ECOG FS: 1  Skin: warm, dry  HEENT: sclerae anicteric, conjunctivae pink, oropharynx clear. No thrush or mucositis.  Lymph Nodes: No cervical or supraclavicular lymphadenopathy  Lungs: clear to auscultation bilaterally, no rales, wheezes, or rhonci, productive cough, no wheezing Heart: regular rate and rhythm  Abdomen: round, soft, non tender, positive bowel sounds  Musculoskeletal: No focal spinal tenderness, no peripheral edema  Neuro: non focal, not oriented to time, positive affect  Breasts: deferred  LAB RESULTS: CBC Latest Ref Rng 10/17/2015 10/10/2015 10/03/2015  WBC 3.9 - 10.3 10e3/uL 5.1 7.0 4.4  Hemoglobin 11.6 - 15.9 g/dL 11.1(L) 11.3(L) 10.1(L)  Hematocrit 34.8 - 46.6 % 33.3(L) 32.7(L) 28.5(L)  Platelets 145 - 400 10e3/uL 174 122(L) 137(L)     CMP Latest Ref Rng 10/17/2015 10/10/2015 10/10/2015  Glucose 70 - 140 mg/dl 90 98 -  BUN 7.0 - 26.0 mg/dL 11.1 7.9 -  Creatinine 0.6 - 1.1 mg/dL 0.7 0.7 -  Sodium 136 - 145 mEq/L 135(L) 132(L) -  Potassium 3.5 - 5.1 mEq/L 3.8 4.1 -  Chloride 101 - 111 mmol/L - - -  CO2 22 - 29 mEq/L 26 25 -  Calcium 8.4 - 10.4 mg/dL 9.1 8.8 -  Total Protein 6.4 - 8.3 g/dL 6.5 5.9(L) 5.5(L)  Total Bilirubin 0.20 - 1.20 mg/dL 3.22(H) 4.28(HH) 4.1(H)  Alkaline Phos 40 - 150 U/L 775(H) 880(H)  849(H)  AST 5 - 34 U/L 338(HH) 442(HH) 425(H)  ALT 0 - 55 U/L 132(H) 116(H) 113(H)    STUDIES: Dg Chest 2 View  09/27/2015  CLINICAL DATA:  Fever, shortness of breath and productive cough. Recently diagnosed pulmonary embolism. EXAM: CHEST  2 VIEW COMPARISON:  Previous examinations. FINDINGS: Stable enlarged cardiac silhouette. Stable right jugular porta catheter. Left breast implant with capsular calcifications. Left breast and left axillary surgical clips. Clear lungs. Diffuse osteopenia. IMPRESSION: No acute abnormality. Electronically Signed   By: Claudie Revering M.D.   On: 09/27/2015 15:31   Ct Angio Chest Pe W/cm &/or Wo Cm  09/26/2015  CLINICAL DATA:  Inpatient. Shortness of breath. Metastatic breast cancer. EXAM: CT ANGIOGRAPHY CHEST WITH CONTRAST TECHNIQUE: Multidetector CT imaging of the chest was performed using the standard protocol during bolus administration of intravenous contrast. Multiplanar CT image reconstructions and MIPs were obtained to evaluate the vascular anatomy. CONTRAST:  163m OMNIPAQUE IOHEXOL 350 MG/ML SOLN COMPARISON:  06/17/2015 PET-CT . FINDINGS: Mediastinum/Nodes: The study is high quality for the evaluation of pulmonary embolism. There is an acute subsegmental pulmonary embolus in the left lower lobe (series 7/ image 135). No additional pulmonary emboli. Mildly atherosclerotic nonaneurysmal thoracic aorta. Dilated main pulmonary artery (3.5 cm diameter), not appreciably changed from 06/17/2015. Normal heart size. No CT evidence of right heart strain. No pericardial fluid/thickening. Right internal jugular MediPort terminates at the cavoatrial junction. Normal visualized thyroid. Normal esophagus. Left axillary surgical clips are again noted. No axillary adenopathy. There are new mildly enlarged right supraclavicular lymph nodes, largest 1.0 cm (series 5/image 6). There is a mildly enlarged 1.0 cm right paratracheal node (series 5/image 22), mildly increased from 0.9 cm. No  additional pathologically enlarged mediastinal or hilar nodes. Lungs/Pleura: No pneumothorax. No pleural effusion. Stable 4 mm anterior right lower lobe pulmonary nodule (series 8/ image 45). Subcentimeter calcified granuloma in the right lower lobe. Stable sharply marginated reticulation and ground-glass opacity in the anterior left upper lobe in keeping with radiation fibrosis. No acute consolidative airspace disease or new significant pulmonary nodules. Upper abdomen: Small hiatal hernia. Partially visualized left adrenal metastasis. Musculoskeletal: Sclerotic lesion in the T9 vertebral body appears slightly increased in size. Mild-to-moderate degenerative changes in the thoracic spine. Intact appearing bilateral breast prostheses. Review of the MIP images confirms the above findings. IMPRESSION: 1. Acute subsegmental left lower lobe pulmonary embolus. 2. Stable chronic main pulmonary artery dilation, suggesting chronic pulmonary arterial hypertension. 3. No CT evidence of right heart strain. 4. New mild right supraclavicular and right paratracheal mediastinal lymphadenopathy, suspicious for metastatic disease. 5. Slight growth of T9 vertebral body sclerotic lesion, probably a bone metastasis . 6. Partially visualized left adrenal metastasis. 7. Stable tiny right lower lobe pulmonary nodule. Critical Value/emergent results were called by telephone at the time of interpretation on 09/26/2015 at 11:05 am to Dr. FFlorencia Reasons, who verbally acknowledged these results. Electronically Signed   By: JJanina MayoD.  On: 09/26/2015 11:13   US Abdomen Complete  09/24/2015  CLINICAL DATA:  Transaminitis for labs.  Initial encounter. EXAM: ABDOMEN ULTRASOUND COMPLETE COMPARISON:  MRI of the abdomen performed 08/23/2015 FINDINGS: Gallbladder: No gallstones or wall thickening visualized. Mild internal echoes within the gallbladder may reflect mild sludge. No sonographic Murphy sign noted by sonographer. Common bile duct:  Diameter: 0.3 cm, within normal limits in caliber. Liver: No focal lesion identified. There is a mildly nodular contour to the liver, raising concern for some degree of hepatic cirrhosis. Within normal limits in parenchymal echogenicity. IVC: Not well characterized due to overlying structures. Pancreas: Visualized portion unremarkable. Spleen: Size and appearance within normal limits. Right Kidney: Length: 14.6 cm. Echogenicity within normal limits. No mass or hydronephrosis visualized. Left Kidney:  Congenitally absent, per patient. Abdominal aorta: Not well characterized distally due to overlying bowel gas. No evidence of aneurysmal dilatation. Other findings: None. IMPRESSION: 1. Mildly nodular contour of the liver raises concern for some degree of hepatic cirrhosis, which may explain the patient's transaminitis. 2. No focal hepatic lesions seen on ultrasound, though if the patient's transaminitis significantly worsens, dynamic liver protocol MRI or CT could be considered for further evaluation. 3. Mild internal echoes within the gallbladder may reflect mild sludge. Gallbladder otherwise unremarkable. Electronically Signed   By: Garald Balding M.D.   On: 09/24/2015 19:06   Mr 3d Recon At Scanner  10/02/2015  CLINICAL DATA:  Metastatic breast cancer. EXAM: MRI ABDOMEN WITHOUT AND WITH CONTRAST (INCLUDING MRCP) TECHNIQUE: Multiplanar multisequence MR imaging of the abdomen was performed both before and after the administration of intravenous contrast. Heavily T2-weighted images of the biliary and pancreatic ducts were obtained, and three-dimensional MRCP images were rendered by post processing. CONTRAST:  33m MULTIHANCE GADOBENATE DIMEGLUMINE 529 MG/ML IV SOLN COMPARISON:  MRI 08/23/2015 FINDINGS: Examination is limited by breathing motion artifact. Lower chest: No obvious lung base lesions or pleural effusion. No pericardial effusion. Bilateral breast prosthesis noted. Hepatobiliary: Stable cirrhotic changes  involving the liver. No early arterial phase enhancing lesions to suggest dysplastic nodule or HCC. No intrahepatic biliary dilatation. Normal caliber and course of the common bile duct. Normal gallbladder. Pancreas: No pancreatic mass, inflammation or ductal dilatation. Spleen: Normal size.  No focal lesions. Adrenals/Urinary Tract: The left adrenal gland mass is smaller. It previously measured 43.5 x 20.5 mm and now measures 35 x 11.5 mm. 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 The right kidney demonstrates compensatory hypertrophy. No renal lesions or hydronephrosis. Stomach/Bowel: The stomach, duodenum, visualized small bowel and visualize colon are grossly normal. Vascular/Lymphatic: Persistent but improved retroperitoneal lymphadenopathy. Largest nodal mass between the atretic left kidney and the aorta measures 30.5 x 25.5 mm on series 1102, image 60. This previously measured 47 x 39 mm. The aorta and branch vessels are patent. The major venous structures are patent. Other: No ascites. Stable anterior abdominal wall hernia containing fat. Musculoskeletal: No significant change in L2 vertebral body lesion. No new osseous lesions. IMPRESSION: 1. Stable cirrhotic changes involving the liver. No worrisome hepatic lesions. 2. Interval decrease in size of the left adrenal mass and retroperitoneal lymphadenopathy. 3. Stable L2 metastatic lesion. Electronically Signed   By: PMarijo SanesM.D.   On: 10/02/2015 13:53   Dg Chest Port 1 View  09/24/2015  CLINICAL DATA:  Fever.  Breast carcinoma. EXAM: PORTABLE CHEST 1 VIEW COMPARISON:  Jan 16, 2011 FINDINGS: Port-A-Cath tip is in the superior vena cava. No pneumothorax. There is ill-defined opacity in the right upper lobe,  concerning for pneumonia. Lungs elsewhere appear clear. Heart is upper normal in size with pulmonary vascularity within normal limits. No apparent adenopathy. There is postoperative change on the left with clips in left axillary region. There are breast  implants bilaterally. No bone lesions. IMPRESSION: Ill-defined opacity right upper lobe, concerning for pneumonia. Lungs elsewhere appear clear. No adenopathy apparent. Electronically Signed   By: Lowella Grip III M.D.   On: 09/24/2015 15:45   Mr Abd W/wo Cm/mrcp  10/02/2015  CLINICAL DATA:  Metastatic breast cancer. EXAM: MRI ABDOMEN WITHOUT AND WITH CONTRAST (INCLUDING MRCP) TECHNIQUE: Multiplanar multisequence MR imaging of the abdomen was performed both before and after the administration of intravenous contrast. Heavily T2-weighted images of the biliary and pancreatic ducts were obtained, and three-dimensional MRCP images were rendered by post processing. CONTRAST:  48m MULTIHANCE GADOBENATE DIMEGLUMINE 529 MG/ML IV SOLN COMPARISON:  MRI 08/23/2015 FINDINGS: Examination is limited by breathing motion artifact. Lower chest: No obvious lung base lesions or pleural effusion. No pericardial effusion. Bilateral breast prosthesis noted. Hepatobiliary: Stable cirrhotic changes involving the liver. No early arterial phase enhancing lesions to suggest dysplastic nodule or HCC. No intrahepatic biliary dilatation. Normal caliber and course of the common bile duct. Normal gallbladder. Pancreas: No pancreatic mass, inflammation or ductal dilatation. Spleen: Normal size.  No focal lesions. Adrenals/Urinary Tract: The left adrenal gland mass is smaller. It previously measured 43.5 x 20.5 mm and now measures 35 x 11.5 mm. 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 The right kidney demonstrates compensatory hypertrophy. No renal lesions or hydronephrosis. Stomach/Bowel: The stomach, duodenum, visualized small bowel and visualize colon are grossly normal. Vascular/Lymphatic: Persistent but improved retroperitoneal lymphadenopathy. Largest nodal mass between the atretic left kidney and the aorta measures 30.5 x 25.5 mm on series 1102, image 60. This previously measured 47 x 39 mm. The aorta and branch vessels are patent. The  major venous structures are patent. Other: No ascites. Stable anterior abdominal wall hernia containing fat. Musculoskeletal: No significant change in L2 vertebral body lesion. No new osseous lesions. IMPRESSION: 1. Stable cirrhotic changes involving the liver. No worrisome hepatic lesions. 2. Interval decrease in size of the left adrenal mass and retroperitoneal lymphadenopathy. 3. Stable L2 metastatic lesion. Electronically Signed   By: PMarijo SanesM.D.   On: 10/02/2015 13:53    ASSESSMENT: 70[place]  ([place] status post left breast biopsy in February of 2011 for an invasive lobular carcinoma measuring 2.9 cm by MRI, with a positive prechemotherapy axillary lymph node biopsy, strongly estrogen and progesterone receptor positive with no evidence of HER-2/neu amplification and an MIB-1 of 14%.   (2) received 6 cycles of docetaxel/ cyclophosphamide in the neoadjuvant setting, completed in mid July of 2011   (3) s/p left lumpectomy and axillary lymph node dissection in August of 2011 for a ypT1b yTN3 (14 positive lymph nodes), Stage IIIC, grade 1 residual tumor. HER-2/neu was repeated, again not amplified.   (4) Completed loco-regional radiation in late October of 2011   (5) on letrozole starting early November 2011, continued to October 2016, with progression  METASTATIC DISEASE: September 2016: Involving left adrenal gland, regional nodes and bones  (6) biopsy of a 7.5 cm left adrenal mass 05/30/2015 showed metastatic carcinoma, estrogen, progesterone, and HER-2 negative, with an MIB-1 of 90-100%; the tumor cells were cytokeratin 7 positive, cytokeratin 20 focally positive, gross cystic disease fluid protein negative  (a) the CA-27-29 is informative  (b) PET scan 06/17/2015 shows, in addition to the  left adrenal mass, some periaortic adenopathy and multiple hypermetabolic bone metastases (L2, T9, T2 and others)  (c) EGD on 06/16/2015 to evaluate suspicious cardiac  wall thickening showed no evidence of malignancy  (7) capecitabine started 07/04/2015 at 1.5 g BID 7/7. Discontinued 08/29/15 with progression  (8) denosumab/ Xgeva started 07/18/2015, repeated monthly  (9) eribulin D1/D8 q 21 days started 09/23/14  (10) hospitalized for infuenza A on 09/26/15. Pulmonary embolus found. Lovenox started daily.  (11) hepatic encephalopathy during hospital stay in January 2017. Lactulose completed. Awaiting approval of rifaximin due to financial strain.   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 It was her understanding that treatment would not restart until next week, which is fine. We discussed a dose reduction of 40% with the start of cycle 2 of treatment.   Her cough is improving. She will continue OTC agents such as robitussin which is helpful. She will alert [place] with any fevers, chills, or mucus production changes.   She has a follow up visit with [facility] tomorrow. I sent a staff message ahead of this appointment detailing the trouble we have run into attempting to get coverage for xifaxan. Her liver function tests are still elevated, but trending downward. The ammonia level is still pending, but likely high. She is not quite oriented during our visit as well.   Shakisha will return in 1 week for cycle 2 of erbulin. She understands and agrees with this plan. She knows the goal of treatment in her case is control. She has been encouraged to call with any issues that might arise before her next visit here.    Laurie Panda, NP 10/17/2015 2:20 PM

## 2015-10-17 NOTE — Telephone Encounter (Signed)
Gave patient avs report and appointments for February and March.  °

## 2015-10-17 NOTE — Progress Notes (Signed)
Patient had critical lab today AST of 338.  Gentry Fitz, NP aware.

## 2015-10-18 ENCOUNTER — Encounter: Payer: Self-pay | Admitting: Physician Assistant

## 2015-10-18 ENCOUNTER — Ambulatory Visit (INDEPENDENT_AMBULATORY_CARE_PROVIDER_SITE_OTHER): Payer: Medicare Other | Admitting: Physician Assistant

## 2015-10-18 VITALS — BP 102/80 | HR 64 | Ht 64.0 in | Wt 166.2 lb

## 2015-10-18 DIAGNOSIS — R7989 Other specified abnormal findings of blood chemistry: Secondary | ICD-10-CM | POA: Diagnosis not present

## 2015-10-18 DIAGNOSIS — R945 Abnormal results of liver function studies: Principal | ICD-10-CM

## 2015-10-18 DIAGNOSIS — R932 Abnormal findings on diagnostic imaging of liver and biliary tract: Secondary | ICD-10-CM

## 2015-10-18 NOTE — Patient Instructions (Signed)
Come to our lab basement level week of 10-31-2015 for labs.  We made you an appointment with Dr. Silvano Rusk for 11-24-2015 at 3:15 PM.  We will call you about the xifaxan medication. We will try to get it through the drug representative.  They may be able to help [place].

## 2015-10-19 ENCOUNTER — Encounter: Payer: Self-pay | Admitting: Physician Assistant

## 2015-10-19 LAB — AMMONIA

## 2015-10-19 NOTE — Progress Notes (Signed)
Patient ID: Maria Pruitt, female   DOB: 16-Dec-1945, 70 y.o.   MRN: [id number]   Subjective:    Patient ID: Maria Pruitt, female    DOB: 19-Jul-1946, 70 y.o.   MRN: [id number]  HPI  Maria Pruitt   is a pleasant 70 year old white female with a complicated medical history. She is recently known to Dr. Carlean Purl when seen for EGD in October 2016, and underwent EGD which showed a hiatal hernia and otherwise normal EGD. Patient is seen back today in post hospital follow-up. She had been admitted to [facility] 09/24/2015 through 10/04/2015. She has history of metastatic invasive lobular breast cancer with metastases to the left adrenal gland regional nodes bones. She also has prior history of hemorrhagic CVA, atrial fibrillation, hypertension, and was diagnosed with a pulmonary embolus on 09/26/2015. She did not have an IVC filter placed but is currently on Lovenox. During this most recent hospitalization she had presented with weakness and altered mental status nausea and poor intake. He was noted to have elevated LFTs and an elevated ammonia level and also tested positive for influenza a and was treated for a community-acquired pneumonia. She was seen in consultation by GI for the elevated LFTs. MRI done in November 2016 had not shown any met metastatic disease to the liver Repeat MRI done 10/02/2015 showed stable cirrhosis, no ductal dilation, no worrisome hepatic lesions and interval decrease in the size of the left adrenal mass and retroperitoneal lymphadenopathy and a stable L2 metastatic lesion. She was started on treatment for encephalopathy during that admission but this may have been multifactorial. He was unable to get Xifaxan as her insurance would not cover it and is currently taking low-dose Chronulac. Patient states that she feels 100% better than when she was in the hospital and is regaining her strength. She has no specific GI complaints at this time. Agent is aware that she had had  mildly elevated LFTs over the past couple of years had been told that she may have fatty liver disease. Workup during this recent admission with chronic hepatic markers including AFP A NA SMA all negative, her ferritin was elevated at 2300. INR 1.1 for a NA is positive with a 1;640 Centromere antibody. Most recent LFTs done on 10/17/2015 total bili 3.2 alkaline phosphatase 775 AST 338 female T1 32, albumin 2.4 These are fairly stable with LFTs done on 10/04/2015 showing total bili of 4.6 alkaline phosphatase 502 AST 153 PLT of 90. Patient had also undergone chemotherapy in January 2017 week or so prior to her admission.  Review of Systems Pertinent positive and negative review of systems were noted in the above HPI section.  All other review of systems was otherwise negative.  Outpatient Encounter Prescriptions as of 10/18/2015  Medication Sig  . ALPRAZolam (XANAX) 0.25 MG tablet Take 0.25 mg by mouth 2 (two) times daily as needed for anxiety. Reported on 09/20/2015  . Ascorbic Acid (VITAMIN C) 100 MG tablet Take 1,000 mg by mouth daily.   Marland Kitchen aspirin (ASPIRIN CHILDRENS) 81 MG chewable tablet Chew 1 tablet (81 mg total) by mouth daily.  . calcium carbonate (TUMS - DOSED IN MG ELEMENTAL CALCIUM) 500 MG chewable tablet Chew 1 tablet (200 mg of elemental calcium total) by mouth 3 (three) times daily.  Marland Kitchen enoxaparin (LOVENOX) 40 MG/0.4ML injection Inject 0.4 mLs (40 mg total) into the skin daily.  . famotidine (PEPCID) 20 MG tablet Take 1 tablet (20 mg total) by mouth 2 (two) times daily.  . hydrochlorothiazide (  HYDRODIURIL) 25 MG tablet Take 1 tablet (25 mg total) by mouth daily.  Marland Kitchen lactulose (CHRONULAC) 10 GM/15ML solution Take 45 mLs (30 g total) by mouth 2 (two) times daily.  Marland Kitchen levothyroxine (SYNTHROID, LEVOTHROID) 100 MCG tablet Take 88 mcg by mouth daily before breakfast.  . lidocaine-prilocaine (EMLA) cream Apply to affected area once  . loratadine (CLARITIN) 10 MG tablet Take 10 mg by mouth daily.  Marland Kitchen  losartan (COZAAR) 50 MG tablet Take 50 mg by mouth daily.  . magic mouthwash w/lidocaine SOLN Take 15 mLs by mouth 4 (four) times daily.  . metoprolol (LOPRESSOR) 50 MG tablet Take 50 mg by mouth 2 (two) times daily.  . Multiple Vitamin (MULTIVITAMIN WITH MINERALS) TABS tablet Take 1 tablet by mouth daily.  . ondansetron (ZOFRAN) 8 MG tablet Take 1 tablet (8 mg total) by mouth 2 (two) times daily. Start the day after chemo for 2 days. Then take as needed for nausea or vomiting.  . ondansetron (ZOFRAN-ODT) 8 MG disintegrating tablet Take 1 tablet (8 mg total) by mouth 2 (two) times daily as needed for nausea or vomiting.  Marland Kitchen oxyCODONE (OXY IR/ROXICODONE) 5 MG immediate release tablet Take 1 tablet (5 mg total) by mouth every 4 (four) hours as needed for severe pain.  Marland Kitchen prochlorperazine (COMPAZINE) 10 MG tablet Take 1 tablet (10 mg total) by mouth every 6 (six) hours as needed (Nausea or vomiting).  . [DISCONTINUED] hydrALAZINE (APRESOLINE) 10 MG tablet Take 10 mg by mouth 4 (four) times daily as needed. FOR SBP>200 or DBP>110  . [DISCONTINUED] potassium chloride 20 MEQ TBCR Take 20 mEq by mouth daily.  . [DISCONTINUED] rifaximin (XIFAXAN) 550 MG TABS tablet Take 1 tablet (550 mg total) by mouth 2 times daily at 12 noon and 4 pm.   Facility-Administered Encounter Medications as of 10/18/2015  Medication  . 0.9 %  sodium chloride infusion   Allergies  Allergen Reactions  . Ciprofloxacin Other (See Comments)  . Clonidine Derivatives Other (See Comments)  . Phenytoin     Other reaction(s): Other Elevated LFTs  . Sulfa Antibiotics Hives  . Benzonatate     REACTION: Swelling, tessalon perles; tolerates benadryl  . Codeine     REACTION: Nausea   Patient Active Problem List   Diagnosis Date Noted  . Malignant neoplasm of central portion of right female breast ([place]) 10/10/2015  . Mucositis oral 09/28/2015  . Acute septic pulmonary embolism without acute cor pulmonale ([facility])   . Pyrexia   . PE  (pulmonary embolism)   . Transaminitis   . Neutropenic fever ([place])   . Influenza A   . Thrombocytopenia ([place]) 09/25/2015  . Anemia of chronic disease 09/25/2015  . Disorder of electrolytes, low Mg, K, Ca, Phosp 09/25/2015  . Acute kidney injury ([place]) 09/25/2015  . Sepsis ([place]) 09/24/2015  . Breast cancer metastasized to bone ([place]) 06/24/2015  . Adrenal mass, left ([place]) 06/08/2015  . Breast cancer of upper-outer quadrant of left female breast ([place]) 11/04/2014  . Stroke occurring within last month 11/04/2014  . Atrial fibrillation with RVR, CHADS 2-3  ([place])   . HYPERTENSION 02/28/2009   Social History   Social History  . Marital Status: Married    Spouse Name: N/A  . Number of Children: N/A  . Years of Education: N/A   Occupational History  . Nurse    Social History Main Topics  . Smoking status: Never Smoker   . Smokeless tobacco: Never Used  . Alcohol Use: No  .  Drug Use: No  . Sexual Activity: Not on file   Other Topics Concern  . Not on file   Social History Narrative    Ms. Gotcher's family history includes Heart attack in her brother and father; Heart disease in her mother. There is no history of Colon cancer.      Objective:    Filed Vitals:   10/18/15 1339  BP: 102/80  Pulse: 64    Physical Exam  well-developed older white female in no acute distress, accompanied by her sister.  pleasant ,blood pressure 102/80 pulse 64 height 5 foot 4 weight 166 HEENT; nontraumatic normocephalic EOMI PERRLA sclera are slightly icteric, Cardiovascular; irregular rate and rhythm with S1-S2, Pulmonary ;clear bilaterally, Abdomen; large soft nontender no definite palpable mass or hepatosplenomegaly bowel sounds are present, Rectal ;exam not done, Extremities; no clubbing cyanosis or edema she does have early icterus, Neuropsych; mood and affect appropriate no asterixis      Assessment & Plan:   #1 70 yo female with metastaic breast cancer to bones and left adrenal gland-  undergoing chemo #2 Recent admission 1/14 /2017 with Influenza, CAP, mental status change- resolved  #3 new dx of cirrhosis- etiology not clear , compensated- no evidence for metastatic  disease  To liver by recent MRI  ANA + Centromere AB 1:640  ? Autoimmune liver disease #4 persistent transaminitis- etiology not clear in setting of recent chemo, sepsis, pmeumonia Again possible underlying chronic hepatitis #5 hx CVA  #6 new PE 09/2015- on Lovenox #7 hx afib #8 encephalopathy- with acute illness - not sure hepatic encephalopathy   Plan; Follow serial LFT's   Continue Chronulac 30 cc po BID for now Pt may need liver BX for definite diagnosis , but not a candidate presently while on lovenox for new PE, and not sure would change management given her co-morbidities  Will follow up with Dr Carlean Purl in 4-6 weeks   Amy Genia Harold PA-C 10/19/2015   Cc: [name], PA-C

## 2015-10-20 ENCOUNTER — Telehealth: Payer: Self-pay | Admitting: Physician Assistant

## 2015-10-20 NOTE — Progress Notes (Signed)
Agree with Ms. Esterwood's assessment and plan. Carl E. Gessner, MD, FACG   

## 2015-10-24 ENCOUNTER — Encounter: Payer: Self-pay | Admitting: Oncology

## 2015-10-24 ENCOUNTER — Ambulatory Visit: Payer: Medicare Other

## 2015-10-24 ENCOUNTER — Ambulatory Visit (HOSPITAL_BASED_OUTPATIENT_CLINIC_OR_DEPARTMENT_OTHER): Payer: Medicare Other | Admitting: Nurse Practitioner

## 2015-10-24 ENCOUNTER — Other Ambulatory Visit (HOSPITAL_BASED_OUTPATIENT_CLINIC_OR_DEPARTMENT_OTHER): Payer: Medicare Other

## 2015-10-24 ENCOUNTER — Telehealth: Payer: Self-pay | Admitting: Oncology

## 2015-10-24 ENCOUNTER — Encounter: Payer: Self-pay | Admitting: Nurse Practitioner

## 2015-10-24 VITALS — BP 128/78 | HR 63 | Temp 97.6°F | Resp 18 | Ht 64.0 in | Wt 171.3 lb

## 2015-10-24 DIAGNOSIS — C50412 Malignant neoplasm of upper-outer quadrant of left female breast: Secondary | ICD-10-CM

## 2015-10-24 DIAGNOSIS — C50912 Malignant neoplasm of unspecified site of left female breast: Secondary | ICD-10-CM | POA: Diagnosis present

## 2015-10-24 DIAGNOSIS — K7290 Hepatic failure, unspecified without coma: Secondary | ICD-10-CM

## 2015-10-24 DIAGNOSIS — C7972 Secondary malignant neoplasm of left adrenal gland: Secondary | ICD-10-CM | POA: Diagnosis not present

## 2015-10-24 DIAGNOSIS — C773 Secondary and unspecified malignant neoplasm of axilla and upper limb lymph nodes: Secondary | ICD-10-CM | POA: Diagnosis not present

## 2015-10-24 DIAGNOSIS — Z95828 Presence of other vascular implants and grafts: Secondary | ICD-10-CM

## 2015-10-24 DIAGNOSIS — Z170 Estrogen receptor positive status [ER+]: Secondary | ICD-10-CM | POA: Diagnosis not present

## 2015-10-24 DIAGNOSIS — K7682 Hepatic encephalopathy: Secondary | ICD-10-CM

## 2015-10-24 DIAGNOSIS — E7220 Disorder of urea cycle metabolism, unspecified: Secondary | ICD-10-CM

## 2015-10-24 DIAGNOSIS — Z8673 Personal history of transient ischemic attack (TIA), and cerebral infarction without residual deficits: Secondary | ICD-10-CM

## 2015-10-24 DIAGNOSIS — C7951 Secondary malignant neoplasm of bone: Secondary | ICD-10-CM | POA: Diagnosis not present

## 2015-10-24 LAB — COMPREHENSIVE METABOLIC PANEL
ALT: 80 U/L — ABNORMAL HIGH (ref 0–55)
AST: 177 U/L (ref 5–34)
Albumin: 2.4 g/dL — ABNORMAL LOW (ref 3.5–5.0)
Alkaline Phosphatase: 616 U/L — ABNORMAL HIGH (ref 40–150)
Anion Gap: 7 mEq/L (ref 3–11)
BUN: 11.2 mg/dL (ref 7.0–26.0)
CHLORIDE: 106 meq/L (ref 98–109)
CO2: 23 meq/L (ref 22–29)
CREATININE: 0.9 mg/dL (ref 0.6–1.1)
Calcium: 8.2 mg/dL — ABNORMAL LOW (ref 8.4–10.4)
EGFR: 70 mL/min/{1.73_m2} — ABNORMAL LOW (ref >90)
GLUCOSE: 111 mg/dL (ref 70–140)
POTASSIUM: 3.9 meq/L (ref 3.5–5.1)
SODIUM: 137 meq/L (ref 136–145)
Total Bilirubin: 2.35 mg/dL — ABNORMAL HIGH (ref 0.20–1.20)
Total Protein: 6.2 g/dL — ABNORMAL LOW (ref 6.4–8.3)

## 2015-10-24 LAB — CBC WITH DIFFERENTIAL/PLATELET
BASO%: 2 % (ref 0.0–2.0)
BASOS ABS: 0.1 10*3/uL (ref 0.0–0.1)
EOS ABS: 0.4 10*3/uL (ref 0.0–0.5)
EOS%: 7.6 % — ABNORMAL HIGH (ref 0.0–7.0)
HCT: 32.5 % — ABNORMAL LOW (ref 34.8–46.6)
HGB: 11 g/dL — ABNORMAL LOW (ref 11.6–15.9)
LYMPH%: 23.4 % (ref 14.0–49.7)
MCH: 37 pg — AB (ref 25.1–34.0)
MCHC: 33.9 g/dL (ref 31.5–36.0)
MCV: 109.3 fL — AB (ref 79.5–101.0)
MONO#: 0.8 10*3/uL (ref 0.1–0.9)
MONO%: 15.1 % — AB (ref 0.0–14.0)
NEUT#: 2.8 10*3/uL (ref 1.5–6.5)
NEUT%: 51.9 % (ref 38.4–76.8)
Platelets: 213 10*3/uL (ref 145–400)
RBC: 2.97 10*6/uL — AB (ref 3.70–5.45)
RDW: 18 % — ABNORMAL HIGH (ref 11.2–14.5)
WBC: 5.4 10*3/uL (ref 3.9–10.3)
lymph#: 1.3 10*3/uL (ref 0.9–3.3)

## 2015-10-24 MED ORDER — LACTULOSE 10 GM/15ML PO SOLN: 30.0000 g | Freq: Two times a day (BID) | ORAL | Status: DC

## 2015-10-24 MED ORDER — SODIUM CHLORIDE 0.9% FLUSH
10.0000 mL | INTRAVENOUS | Status: DC | PRN
  Filled 2015-10-24: qty 10

## 2015-10-24 NOTE — Progress Notes (Signed)
Critical lab today- AST- Andrews, NP aware.

## 2015-10-24 NOTE — Progress Notes (Signed)
Pt's son Mr. Lynne Leader came in to discuss his mother's bills.  I informed him of the grant from [employer] that [place] got her approved for that covers her chemo drugs which will dramatically decrease her balance.  I also looked and printed off her bills from the hospital.  I informed him of the [place] and gave him an expense sheet.  He will bring in her SS letter or bank statement to see if she qualifies for the grant.  He has Shauna's card for any billing questions or concerns.

## 2015-10-24 NOTE — Telephone Encounter (Signed)
Called pt to inform her the prescription was sent to [facility].

## 2015-10-24 NOTE — Patient Instructions (Signed)

## 2015-10-24 NOTE — Addendum Note (Signed)
Addended by: Clifton James D on: 10/24/2015 11:26 AM   Modules accepted: Miquel Dunn

## 2015-10-24 NOTE — Telephone Encounter (Signed)
Patient sent back to lab and given avs report and appointments for February and March. No other order.

## 2015-10-24 NOTE — Progress Notes (Signed)
. Maria Pruitt   DOB: 28-Aug-1946  MR#: [id number]  CNO#:[id number]  Maria Nova, PA-C GYN:  SU:  OTHER MD:  CHIEF COMPLAINT: left adrenal metastatsis  CURRENT TREATMENT: capecitabine, denosumab  BREAST CANCER HISTORY:  from the original intake note:  Maria Pruitt herself palpated a change in her left breast, shortly before her mammogram was due.  She brought this to Maria Pruitt attention and he set her up for diagnostic mammography on October 31, 2009.   Routine and implant displaced views of both breasts showed a 2 cm irregular mass in the outer left breast, seen only on the spot tangential view.  The breast tissue itself was fatty.  There were no suspicious calcifications.  This mass was palpable to Maria Pruitt.  An ultrasound showed it to be 1.7 cm irregular and hypoechoic.  There were at least two enlarged left axillary lymph nodes identified.  Maria Pruitt recommended biopsy which was performed the same day and showed ([id number]) and invasive breast cancer felt most likely to be ductal with lobular features.  The left axillary lymph node biopsy also showed the same tumor (similar morphologic findings) and both prognostic profiles showed the cancer to be strongly ER and PR positive with a low to borderline proliferation fraction (13 and 14%).  Neither mass showed amplification of Her-2 by CISH.  (The ratios were 1.1 and 0.85.)    With this information, the patient was referred to Dr. Margot Pruitt and bilateral breast MRIs were obtained March 1st.  This showed several left axillary lymph nodes with cortical thickening, the largest one measuring 1.1 cm.  The mass itself measured 2.9 cm by MRI.  It was spiculated and irregular.  There was no evidence of contralateral disease.   She was treated neoadjuvantly with docetaxel and cyclophosphamide for 6 cycles completed in July 2011, after which she underwent left lumpectomy and axillary dissection August 2011 for a ypT1b yTN3 (14 positive lymph  nodes), Stage IIIC, grade 1 residual tumor. HER-2/neu was repeated, again not amplified. After completing local regional radiation October 2011 she started letrozole November 2011, continued to September 2016, which she was found to have metastatic disease   METASTATIC DISEASE HISTORY: From the 06/08/2015 summary:   Maria Pruitt returns today for a new problem accompanied by her sister Maria Pruitt. To summarize the recent history: She had a hemorrhagic stroke in January 2016 with some residuals. She has been living next door to her son in the [place] area since then.--In August she presented to her primary care physician, Maria Pruitt, with a complaint of abdominal discomfort, nausea and vomiting. He treated her for a UTI w/o resolution and obtained a KUB in his office; this was nondiagnostic. Accordingly he set up the patient for CT scan of the abdomen and pelvis 05/06/2015. This showed the left adrenal gland to be enlarged to 7.5 x 4.0 cm. There was eccentric wall thickening of the gastric cardia. There were small lymph nodes in the upper abdomen and retroperitoneum. There was no liver involvement. The right adrenal gland was unremarkable. There was severe left kidney atrophy (congenital).  The patient was then set up for biopsy of the left adrenal mass on 05/30/2015. This showed Maria Pruitt [id number]) metastatic carcinoma which was cytokeratin 7 positive, with rare cytokeratin 20 positivity and was negative for gross cystic disease fluid protein. The prognostic panel showed this tumor to be estrogen and progesterone receptor negative, HER-2 negative, with an MIB-1 of 90-100%.   PET scan10/03/2015 which showed in additional to the  left adrenal mass, some regional lymph nodes and multiple bone lesions. There was no obvious primary tumor. She also had an EGD to evaluate a possible gastric primary suggested by the yearly a CT scan of the chest. This was normal. We also obtained tumor markerswhich showed a normal CA 19,a  mildly elevated CEA at 9.7,Maria Pruitt is significantly elevated CA-27-29 at 125. Given this data, the most likely interpretation is that we are dealing with an estrogen receptor negative recurrence of her earlier estrogen receptor positive breast cancer, now stage IV  Her subsequent history is as detailed below  INTERVAL HISTORY: Maria Pruitt returns today for follow-up of her metastatic triple negative breast cancer, accompanied by her son Maria Pruitt. She would have been due for day 1, cycle 2 of eribulin, given on day 1 and day 8 of every 21 day cycle, with neulasta onpro given on day 2 for granulocyte support.This continues to be on hold as she recovers from a recent hospital admission.   Last week she followed up with [facility]. They want her to start lactulose BID, but due to complications was only able to pick this up today. She is wary about taking the full dose twice daily because of her experience with 10+ bowel movements daily while on it in the hospital. Furthermore, we have not been able to process her ammonia level because the samples have been lipemic. Her son cites concerns about being on lactulose with no way of checking her progress.   REVIEW OF SYSTEMS: Maria Pruitt feels almost back to her normal self. She denies fevers, chills, nausea, vomiting, or changes in bowel or bladder habits. She is in no pain whatsoever. She is stronger and has more energy. Her appetite is good. She is sleeping well at night. Her cognitive status has improved. She is slow to tell the time or date, but can get there with a few seconds. She denies shortness of breath, chest pain, or palpitations. She is no longer coughing. A detailed review of systems is otherwise stable.  PAST MEDICAL HISTORY: Past Medical History  Diagnosis Date  . Hypertension   . Breast cancer ([place]) 2011    Stage 3  . Atrial fibrillation ([place])   . Hypercholesterolemia   . Abnormal ECG   . Allergy     seasonal  . Arthritis   . GERD  (gastroesophageal reflux disease)   . 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 Seizures ([place])   . Stroke ([place])     dec 15  . Thyroid disease   . Adrenal cancer ([place])    1. Significant for pituitary adenoma which was removed in 1983 but recurred, requiring radiation and briefly some Parlodel.  She has been off treatment since 1985.  She has some acromegaly secondary to this tumor.   2. She is status post bilateral submuscular breast implants under Dr. Towanda Malkin in 1987.   3. She is status post C-section for her third child who was hydrocephalic.   4. She underwent rhinoplasty in 1984.   5. She has a history of hypertension. 6. History of mitral valve prolapse, but she says she has not been receiving antibiotics prior to dental or similar procedures.   7. History of hypothyroidism.   8. History of congenital absence of one kidney.   9. History of hyperlipidemia.  10. History of obesity.   11. History of renal stones.   12. History of fatty liver.   13. History of gout.  14. History of palpitations.   History of childhood asthma  PAST SURGICAL HISTORY: Past Surgical History  Procedure Laterality Date  . Cesarean section  1980  . Breast enhancement surgery  1987  . Umbilical hernia repair    . Tubal ligation    . Scar revision    . Pituitary surgery      adenoma  . Rhinoplasty  1985  . Breast lumpectomy Left   . Colonoscopy      FAMILY HISTORY Family History  Problem Relation Age of Onset  . Heart disease Mother   . Heart attack Father   . Heart attack Brother   . Colon cancer Neg Hx    The patient's father died from a myocardial infarction at the age of 64.  The patient's mother died from complications of congestive heart failure at the age of 57.  The patient had one brother who died from a myocardial infarction at the age of 4.  One of the brothers and two sisters are alive and well.  Sr. Maria Pruitt is an ICU nurse. There is no history of breast or ovarian  cancer in the family to her knowledge.  GYNECOLOGIC HISTORY: She is GX P3, first pregnancy to term at age 34.  She went through the change of life at the time of her pituitary surgery in 1983.  She took hormone replacement for less than a year because of poor tolerance.    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  She gave up her job in July 23, 2010.  Her first husband died from chronic myeloid leukemia in 1985.  Her three children from that marriage include her son Randall Hiss who died from complications of hydrocephaly at age 8; son Maria Pruitt who is a Insurance underwriter and son [name] who is an Public house manager in this area.  The patient has five grandchildren.  Her second husband of 20+ years, Ron, is a Company secretary of an independent [employer] and also does Writer and is a Oceanographer. He is now disabled due toa traumatic brain injury. He lost his first wife to endometrial cancer.  He has a daughter from that marriage.  She lives in [place]: not in place  HEALTH MAINTENANCE: Social History  Substance Use Topics  . Smoking status: Never Smoker   . Smokeless tobacco: Never Used  . Alcohol Use: No     Colonoscopy:  PAP:  Bone density: January 2012/ osteopenia  Lipid panel:  Allergies  Allergen Reactions  . Ciprofloxacin Other (See Comments)  . Clonidine Derivatives Other (See Comments)  . Phenytoin     Other reaction(s): Other Elevated LFTs  . Sulfa Antibiotics Hives  . Benzonatate     REACTION: Swelling, tessalon perles; tolerates benadryl  . Codeine     REACTION: Nausea    Current Outpatient Prescriptions  Medication Sig Dispense Refill  . Ascorbic Acid (VITAMIN C) 100 MG tablet Take 1,000 mg by mouth daily.     Marland Kitchen aspirin (ASPIRIN CHILDRENS) 81 MG chewable tablet Chew 1 tablet (81 mg total) by mouth daily. 100 tablet 4  . calcium carbonate (TUMS - DOSED IN MG ELEMENTAL CALCIUM) 500 MG  chewable tablet Chew 1 tablet (200 mg of elemental calcium total) by mouth 3 (three) times daily. 30 tablet 0  . enoxaparin (LOVENOX) 40 MG/0.4ML injection Inject 0.4 mLs (40 mg total) into the skin daily. 30 Syringe 1  . famotidine (  PEPCID) 20 MG tablet Take 1 tablet (20 mg total) by mouth 2 (two) times daily. 180 tablet 4  . hydrochlorothiazide (HYDRODIURIL) 25 MG tablet Take 1 tablet (25 mg total) by mouth daily.    Marland Kitchen lactulose (CHRONULAC) 10 GM/15ML solution Take 45 mLs (30 g total) by mouth 2 (two) times daily. 240 mL 1  . levothyroxine (SYNTHROID, LEVOTHROID) 100 MCG tablet Take 88 mcg by mouth daily before breakfast.    . loratadine (CLARITIN) 10 MG tablet Take 10 mg by mouth daily.    Marland Kitchen losartan (COZAAR) 50 MG tablet Take 50 mg by mouth daily.    . metoprolol (LOPRESSOR) 50 MG tablet Take 50 mg by mouth 2 (two) times daily.    . Multiple Vitamin (MULTIVITAMIN WITH MINERALS) TABS tablet Take 1 tablet by mouth daily. 30 tablet 0  . magic mouthwash w/lidocaine SOLN Take 15 mLs by mouth 4 (four) times daily. (Patient not taking: Reported on 10/24/2015) 100 mL 0  . ondansetron (ZOFRAN-ODT) 8 MG disintegrating tablet Take 1 tablet (8 mg total) by mouth 2 (two) times daily as needed for nausea or vomiting. (Patient not taking: Reported on 10/24/2015) 20 tablet 3  . oxyCODONE (OXY IR/ROXICODONE) 5 MG immediate release tablet Take 1 tablet (5 mg total) by mouth every 4 (four) hours as needed for severe pain. (Patient not taking: Reported on 10/24/2015) 30 tablet 0   No current facility-administered medications for this visit.   Facility-Administered Medications Ordered in Other Visits  Medication Dose Route Frequency Provider Last Rate Last Dose  . 0.9 %  sodium chloride infusion   Intravenous Once Maria Cruel, MD        OBJECTIVE: Middle-aged white woman in no acute distress, seated in wheelchair Filed Vitals:   10/24/15 1003  BP: 128/78  Pulse: 63  Temp: 97.6 F (36.4 C)  Resp: 18      Body mass index is 29.39 kg/(m^2).    ECOG FS: 1  Sclerae unicteric, pupils round and equal Oropharynx clear and moist-- no thrush or other lesions No cervical or supraclavicular adenopathy Lungs no rales or rhonchi Heart regular rate and rhythm Abd soft, nontender, positive bowel sounds MSK no focal spinal tenderness, no upper extremity lymphedema Neuro: nonfocal, slowly oriented to time, appropriate affect Breasts: deferred   LAB RESULTS: CBC Latest Ref Rng 10/24/2015 10/17/2015 10/10/2015  WBC 3.9 - 10.3 10e3/uL 5.4 5.1 7.0  Hemoglobin 11.6 - 15.9 g/dL 11.0(L) 11.1(L) 11.3(L)  Hematocrit 34.8 - 46.6 % 32.5(L) 33.3(L) 32.7(L)  Platelets 145 - 400 10e3/uL 213 174 122(L)     CMP Latest Ref Rng 10/24/2015 10/17/2015 10/10/2015  Glucose 70 - 140 mg/dl 111 90 98  BUN 7.0 - 26.0 mg/dL 11.2 11.1 7.9  Creatinine 0.6 - 1.1 mg/dL 0.9 0.7 0.7  Sodium 136 - 145 mEq/L 137 135(L) 132(L)  Potassium 3.5 - 5.1 mEq/L 3.9 3.8 4.1  Chloride 101 - 111 mmol/L - - -  CO2 22 - 29 mEq/L _0 Calcium 8.4 - 10.4 mg/dL 8.2(L) 9.1 8.8  Total Protein 6.4 - 8.3 g/dL 6.2(L) 6.5 5.9(L)  Total Bilirubin 0.20 - 1.20 mg/dL 2.35(H) 3.22(H) 4.28(HH)  Alkaline Phos 40 - 150 U/L 616(H) 775(H) 880(H)  AST 5 - 34 U/L 177(HH) 338(HH) 442(HH)  ALT 0 - 55 U/L 80(H) 132(H) 116(H)    STUDIES: Dg Chest 2 View  09/27/2015  CLINICAL DATA:  Fever, shortness of breath and productive cough. Recently diagnosed pulmonary embolism. EXAM: CHEST  2  VIEW COMPARISON:  Previous examinations. FINDINGS: Stable enlarged cardiac silhouette. Stable right jugular porta catheter. Left breast implant with capsular calcifications. Left breast and left axillary surgical clips. Clear lungs. Diffuse osteopenia. IMPRESSION: No acute abnormality. Electronically Signed   By: Claudie Revering M.D.   On: 09/27/2015 15:31   Ct Angio Chest Pe W/cm &/or Wo Cm  09/26/2015  CLINICAL DATA:  Inpatient. Shortness of breath. Metastatic breast cancer. EXAM: CT  ANGIOGRAPHY CHEST WITH CONTRAST TECHNIQUE: Multidetector CT imaging of the chest was performed using the standard protocol during bolus administration of intravenous contrast. Multiplanar CT image reconstructions and MIPs were obtained to evaluate the vascular anatomy. CONTRAST:  149m OMNIPAQUE IOHEXOL 350 MG/ML SOLN COMPARISON:  06/17/2015 PET-CT . FINDINGS: Mediastinum/Nodes: The study is high quality for the evaluation of pulmonary embolism. There is an acute subsegmental pulmonary embolus in the left lower lobe (series 7/ image 135). No additional pulmonary emboli. Mildly atherosclerotic nonaneurysmal thoracic aorta. Dilated main pulmonary artery (3.5 cm diameter), not appreciably changed from 06/17/2015. Normal heart size. No CT evidence of right heart strain. No pericardial fluid/thickening. Right internal jugular MediPort terminates at the cavoatrial junction. Normal visualized thyroid. Normal esophagus. Left axillary surgical clips are again noted. No axillary adenopathy. There are new mildly enlarged right supraclavicular lymph nodes, largest 1.0 cm (series 5/image 6). There is a mildly enlarged 1.0 cm right paratracheal node (series 5/image 22), mildly increased from 0.9 cm. No additional pathologically enlarged mediastinal or hilar nodes. Lungs/Pleura: No pneumothorax. No pleural effusion. Stable 4 mm anterior right lower lobe pulmonary nodule (series 8/ image 45). Subcentimeter calcified granuloma in the right lower lobe. Stable sharply marginated reticulation and ground-glass opacity in the anterior left upper lobe in keeping with radiation fibrosis. No acute consolidative airspace disease or new significant pulmonary nodules. Upper abdomen: Small hiatal hernia. Partially visualized left adrenal metastasis. Musculoskeletal: Sclerotic lesion in the T9 vertebral body appears slightly increased in size. Mild-to-moderate degenerative changes in the thoracic spine. Intact appearing bilateral breast  prostheses. Review of the MIP images confirms the above findings. IMPRESSION: 1. Acute subsegmental left lower lobe pulmonary embolus. 2. Stable chronic main pulmonary artery dilation, suggesting chronic pulmonary arterial hypertension. 3. No CT evidence of right heart strain. 4. New mild right supraclavicular and right paratracheal mediastinal lymphadenopathy, suspicious for metastatic disease. 5. Slight growth of T9 vertebral body sclerotic lesion, probably a bone metastasis . 6. Partially visualized left adrenal metastasis. 7. Stable tiny right lower lobe pulmonary nodule. Critical Value/emergent results were called by telephone at the time of interpretation on 09/26/2015 at 11:05 am to Dr. FFlorencia Reasons, who verbally acknowledged these results. Electronically Signed   By: JIlona SorrelM.D.   On: 09/26/2015 11:13   UKoreaAbdomen Complete  09/24/2015  CLINICAL DATA:  Transaminitis for labs.  Initial encounter. EXAM: ABDOMEN ULTRASOUND COMPLETE COMPARISON:  MRI of the abdomen performed 08/23/2015 FINDINGS: Gallbladder: No gallstones or wall thickening visualized. Mild internal echoes within the gallbladder may reflect mild sludge. No sonographic Murphy sign noted by sonographer. Common bile duct: Diameter: 0.3 cm, within normal limits in caliber. Liver: No focal lesion identified. There is a mildly nodular contour to the liver, raising concern for some degree of hepatic cirrhosis. Within normal limits in parenchymal echogenicity. IVC: Not well characterized due to overlying structures. Pancreas: Visualized portion unremarkable. Spleen: Size and appearance within normal limits. Right Kidney: Length: 14.6 cm. Echogenicity within normal limits. No mass or hydronephrosis visualized. Left Kidney:  Congenitally absent, per patient. Abdominal aorta:  Not well characterized distally due to overlying bowel gas. No evidence of aneurysmal dilatation. Other findings: None. IMPRESSION: 1. Mildly nodular contour of the liver raises  concern for some degree of hepatic cirrhosis, which may explain the patient's transaminitis. 2. No focal hepatic lesions seen on ultrasound, though if the patient's transaminitis significantly worsens, dynamic liver protocol MRI or CT could be considered for further evaluation. 3. Mild internal echoes within the gallbladder may reflect mild sludge. Gallbladder otherwise unremarkable. Electronically Signed   By: Garald Balding M.D.   On: 09/24/2015 19:06   Mr 3d Recon At Scanner  10/02/2015  CLINICAL DATA:  Metastatic breast cancer. EXAM: MRI ABDOMEN WITHOUT AND WITH CONTRAST (INCLUDING MRCP) TECHNIQUE: Multiplanar multisequence MR imaging of the abdomen was performed both before and after the administration of intravenous contrast. Heavily T2-weighted images of the biliary and pancreatic ducts were obtained, and three-dimensional MRCP images were rendered by post processing. CONTRAST:  57m MULTIHANCE GADOBENATE DIMEGLUMINE 529 MG/ML IV SOLN COMPARISON:  MRI 08/23/2015 FINDINGS: Examination is limited by breathing motion artifact. Lower chest: No obvious lung base lesions or pleural effusion. No pericardial effusion. Bilateral breast prosthesis noted. Hepatobiliary: Stable cirrhotic changes involving the liver. No early arterial phase enhancing lesions to suggest dysplastic nodule or HCC. No intrahepatic biliary dilatation. Normal caliber and course of the common bile duct. Normal gallbladder. Pancreas: No pancreatic mass, inflammation or ductal dilatation. Spleen: Normal size.  No focal lesions. Adrenals/Urinary Tract: The left adrenal gland mass is smaller. It previously measured 43.5 x 20.5 mm and now measures 35 x 11.5 mm. 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 The right kidney demonstrates compensatory hypertrophy. No renal lesions or hydronephrosis. Stomach/Bowel: The stomach, duodenum, visualized small bowel and visualize colon are grossly normal. Vascular/Lymphatic: Persistent but improved retroperitoneal  lymphadenopathy. Largest nodal mass between the atretic left kidney and the aorta measures 30.5 x 25.5 mm on series 1102, image 60. This previously measured 47 x 39 mm. The aorta and branch vessels are patent. The major venous structures are patent. Other: No ascites. Stable anterior abdominal wall hernia containing fat. Musculoskeletal: No significant change in L2 vertebral body lesion. No new osseous lesions. IMPRESSION: 1. Stable cirrhotic changes involving the liver. No worrisome hepatic lesions. 2. Interval decrease in size of the left adrenal mass and retroperitoneal lymphadenopathy. 3. Stable L2 metastatic lesion. Electronically Signed   By: PMarijo SanesM.D.   On: 10/02/2015 13:53   Dg Chest Port 1 View  09/24/2015  CLINICAL DATA:  Fever.  Breast carcinoma. EXAM: PORTABLE CHEST 1 VIEW COMPARISON:  Jan 16, 2011 FINDINGS: Port-A-Cath tip is in the superior vena cava. No pneumothorax. There is ill-defined opacity in the right upper lobe, concerning for pneumonia. Lungs elsewhere appear clear. Heart is upper normal in size with pulmonary vascularity within normal limits. No apparent adenopathy. There is postoperative change on the left with clips in left axillary region. There are breast implants bilaterally. No bone lesions. IMPRESSION: Ill-defined opacity right upper lobe, concerning for pneumonia. Lungs elsewhere appear clear. No adenopathy apparent. Electronically Signed   By: WLowella GripIII M.D.   On: 09/24/2015 15:45   Mr Abd W/wo Cm/mrcp  10/02/2015  CLINICAL DATA:  Metastatic breast cancer. EXAM: MRI ABDOMEN WITHOUT AND WITH CONTRAST (INCLUDING MRCP) TECHNIQUE: Multiplanar multisequence MR imaging of the abdomen was performed both before and after the administration of intravenous contrast. Heavily T2-weighted images of the biliary and pancreatic ducts were obtained, and three-dimensional MRCP images were rendered by post processing. CONTRAST:  47m MULTIHANCE GADOBENATE DIMEGLUMINE 529  MG/ML IV SOLN COMPARISON:  MRI 08/23/2015 FINDINGS: Examination is limited by breathing motion artifact. Lower chest: No obvious lung base lesions or pleural effusion. No pericardial effusion. Bilateral breast prosthesis noted. Hepatobiliary: Stable cirrhotic changes involving the liver. No early arterial phase enhancing lesions to suggest dysplastic nodule or HCC. No intrahepatic biliary dilatation. Normal caliber and course of the common bile duct. Normal gallbladder. Pancreas: No pancreatic mass, inflammation or ductal dilatation. Spleen: Normal size.  No focal lesions. Adrenals/Urinary Tract: The left adrenal gland mass is smaller. It previously measured 43.5 x 20.5 mm and now measures 35 x 11.5 mm. 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 The right kidney demonstrates compensatory hypertrophy. No renal lesions or hydronephrosis. Stomach/Bowel: The stomach, duodenum, visualized small bowel and visualize colon are grossly normal. Vascular/Lymphatic: Persistent but improved retroperitoneal lymphadenopathy. Largest nodal mass between the atretic left kidney and the aorta measures 30.5 x 25.5 mm on series 1102, image 60. This previously measured 47 x 39 mm. The aorta and branch vessels are patent. The major venous structures are patent. Other: No ascites. Stable anterior abdominal wall hernia containing fat. Musculoskeletal: No significant change in L2 vertebral body lesion. No new osseous lesions. IMPRESSION: 1. Stable cirrhotic changes involving the liver. No worrisome hepatic lesions. 2. Interval decrease in size of the left adrenal mass and retroperitoneal lymphadenopathy. 3. Stable L2 metastatic lesion. Electronically Signed   By: PMarijo SanesM.D.   On: 10/02/2015 13:53    ASSESSMENT: 70y.o. [place]  (1) status post left breast biopsy in February of 2011 for an invasive lobular carcinoma measuring 2.9 cm by MRI, with a positive prechemotherapy axillary lymph node biopsy, strongly  estrogen and progesterone receptor positive with no evidence of HER-2/neu amplification and an MIB-1 of 14%.   (2) received 6 cycles of docetaxel/ cyclophosphamide in the neoadjuvant setting, completed in mid July of 2011   (3) s/p left lumpectomy and axillary lymph node dissection in August of 2011 for a ypT1b yTN3 (14 positive lymph nodes), Stage IIIC, grade 1 residual tumor. HER-2/neu was repeated, again not amplified.   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 Discontinued 08/29/15 with progression  (8) denosumab/ Xgeva started 07/18/2015, repeated monthly  (9) eribulin D1/D8 q 21 days started 09/23/14. 1 complete cycle given before hospitalization. Discontinued due to elevation in LFTs.   (10) hospitalized for infuenza A on 09/26/15. Pulmonary embolus found. Lovenox started daily.  (11) hepatic encephalopathy during hospital stay in January 2017. Lactulose completed. Awaiting approval of rifaximin due to financial strain.   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 Jana Hakim and he was present for a portion of this visit. We reviewed the labs with Maria Pruitt and her son. There is improvement across the board in her liver function tests. It is unclear the exact etiology of her hepatic encephalopathy, but it is possible the eribulin she had been receiving contributed to this condition, in part. Dr. Jana Hakim believes it would be prudent to switch her to a drug that relies more so on the kidneys for elimination. He decided that next week she will begin carboplatin, day 1 and day 8, given every 21 days. This will be dose reduced by some measure. It is important to note that the patient was born with 1 kidney. She will stay well hydrated.   We have had difficulty obtaining an ammonia level on Maria Pruitt. Her blood is frequently lipemic which does not allow our machines to produce an accurate number. We will try a redraw today. For now, she will take the lactulose daily. If this is inducing a manageable amount of stools during the day, she will omit the evening dose.   Maria Pruitt will return 1 week for follow up and to begin day 1, cycle 1 of carboplatin. She understands and agrees with this plan. She knows the goal of treatment in her case is control. She has been encouraged to call with any issues that might arise before her next visit here.    Laurie Panda, NP 10/24/2015 11:21 AM  ADDENDUM: Maria Pruitt's liver function tests continue to improve. She was on a lot of medication all at once because of her viral illness as well as the revealing. It is difficult to sort all that out. Of course she does have baseline cirrhosis which is not clearly related to her history of cancer.  I think it would be prudent to change her treatment to a different drug that is completely not related to the liver and that is going to be carboplatin. I think it would be prudent also to wait another week before starting  therapy. Of course we will have to be careful with dosing because she only has 1 kidney. My hope is she will be able to tolerate this better and in the meantime her mental status continues to improve.  I suggested they take only enough lactulose so that she has 2 loose bowel movements daily.  She will see [place] again in 1 week. She knows to call for any problems that may develop before that visit.   I personally saw this patient and performed a substantive portion of this encounter with the listed APP documented above.   Maria Cruel, MD Medical Oncology and Hematology [facility] [address] [place], [place] [address] Tel. [phone]    Fax. [phone]

## 2015-10-25 LAB — AMMONIA: Ammonia, Plasma: 108 ug/dL — ABNORMAL HIGH (ref 19–87)

## 2015-10-31 ENCOUNTER — Telehealth: Payer: Self-pay | Admitting: Oncology

## 2015-10-31 ENCOUNTER — Ambulatory Visit (HOSPITAL_BASED_OUTPATIENT_CLINIC_OR_DEPARTMENT_OTHER): Payer: Medicare Other

## 2015-10-31 ENCOUNTER — Other Ambulatory Visit (HOSPITAL_BASED_OUTPATIENT_CLINIC_OR_DEPARTMENT_OTHER): Payer: Medicare Other

## 2015-10-31 ENCOUNTER — Ambulatory Visit: Payer: Medicare Other

## 2015-10-31 ENCOUNTER — Telehealth: Payer: Self-pay | Admitting: *Deleted

## 2015-10-31 ENCOUNTER — Other Ambulatory Visit: Payer: Self-pay | Admitting: Oncology

## 2015-10-31 ENCOUNTER — Ambulatory Visit (HOSPITAL_BASED_OUTPATIENT_CLINIC_OR_DEPARTMENT_OTHER): Payer: Medicare Other | Admitting: Oncology

## 2015-10-31 VITALS — BP 134/74 | HR 59 | Temp 97.5°F | Resp 18 | Ht 64.0 in | Wt 168.4 lb

## 2015-10-31 DIAGNOSIS — C50912 Malignant neoplasm of unspecified site of left female breast: Secondary | ICD-10-CM

## 2015-10-31 DIAGNOSIS — C7951 Secondary malignant neoplasm of bone: Secondary | ICD-10-CM | POA: Diagnosis not present

## 2015-10-31 DIAGNOSIS — C7972 Secondary malignant neoplasm of left adrenal gland: Secondary | ICD-10-CM | POA: Diagnosis not present

## 2015-10-31 DIAGNOSIS — Z95828 Presence of other vascular implants and grafts: Secondary | ICD-10-CM

## 2015-10-31 DIAGNOSIS — C773 Secondary and unspecified malignant neoplasm of axilla and upper limb lymph nodes: Secondary | ICD-10-CM

## 2015-10-31 DIAGNOSIS — I2699 Other pulmonary embolism without acute cor pulmonale: Secondary | ICD-10-CM

## 2015-10-31 DIAGNOSIS — Z8673 Personal history of transient ischemic attack (TIA), and cerebral infarction without residual deficits: Secondary | ICD-10-CM

## 2015-10-31 DIAGNOSIS — Z452 Encounter for adjustment and management of vascular access device: Secondary | ICD-10-CM

## 2015-10-31 DIAGNOSIS — Z7901 Long term (current) use of anticoagulants: Secondary | ICD-10-CM

## 2015-10-31 LAB — COMPREHENSIVE METABOLIC PANEL
ALT: 47 U/L (ref 0–55)
AST: 101 U/L — AB (ref 5–34)
Albumin: 2.6 g/dL — ABNORMAL LOW (ref 3.5–5.0)
Alkaline Phosphatase: 548 U/L — ABNORMAL HIGH (ref 40–150)
Anion Gap: 8 mEq/L (ref 3–11)
BILIRUBIN TOTAL: 2.09 mg/dL — AB (ref 0.20–1.20)
BUN: 11 mg/dL (ref 7.0–26.0)
CALCIUM: 8.5 mg/dL (ref 8.4–10.4)
CHLORIDE: 103 meq/L (ref 98–109)
CO2: 25 meq/L (ref 22–29)
CREATININE: 0.7 mg/dL (ref 0.6–1.1)
EGFR: >90 mL/min/{1.73_m2} (ref >90)
Glucose: 133 mg/dl (ref 70–140)
Potassium: 3.7 mEq/L (ref 3.5–5.1)
Sodium: 135 mEq/L — ABNORMAL LOW (ref 136–145)
TOTAL PROTEIN: 6.5 g/dL (ref 6.4–8.3)

## 2015-10-31 LAB — CBC WITH DIFFERENTIAL/PLATELET
BASO%: 1.8 % (ref 0.0–2.0)
Basophils Absolute: 0.1 10*3/uL (ref 0.0–0.1)
EOS%: 12.4 % — AB (ref 0.0–7.0)
Eosinophils Absolute: 0.5 10*3/uL (ref 0.0–0.5)
HEMATOCRIT: 36.3 % (ref 34.8–46.6)
HGB: 12 g/dL (ref 11.6–15.9)
LYMPH#: 1.2 10*3/uL (ref 0.9–3.3)
LYMPH%: 26.5 % (ref 14.0–49.7)
MCH: 35.9 pg — ABNORMAL HIGH (ref 25.1–34.0)
MCHC: 33.1 g/dL (ref 31.5–36.0)
MCV: 108.7 fL — ABNORMAL HIGH (ref 79.5–101.0)
MONO#: 0.9 10*3/uL (ref 0.1–0.9)
MONO%: 20.1 % — ABNORMAL HIGH (ref 0.0–14.0)
NEUT%: 39.2 % (ref 38.4–76.8)
NEUTROS ABS: 1.7 10*3/uL (ref 1.5–6.5)
Platelets: 170 10*3/uL (ref 145–400)
RBC: 3.34 10*6/uL — AB (ref 3.70–5.45)
RDW: 15.6 % — ABNORMAL HIGH (ref 11.2–14.5)
WBC: 4.4 10*3/uL (ref 3.9–10.3)

## 2015-10-31 MED ORDER — SODIUM CHLORIDE 0.9% FLUSH
10.0000 mL | INTRAVENOUS | Status: DC | PRN
  Administered 2015-10-31: 10 mL via INTRAVENOUS
  Filled 2015-10-31: qty 10

## 2015-10-31 NOTE — Progress Notes (Signed)
. ID: Maria Pruitt   DOB: [birth year]  MR#: [id number]  VQM#:[id number]  Maria Nova, PA-C GYN:  SU:  OTHER MD:  CHIEF COMPLAINT: left adrenal metastatsis  CURRENT TREATMENT: denosumab, carboplatin  BREAST CANCER HISTORY:  from the original intake note:  Maria Pruitt herself palpated a change in her left breast, shortly before her mammogram was due.  She brought this to [name] attention and he set her up for diagnostic mammography on October 31, 2009.   Routine and implant displaced views of both breasts showed a 2 cm irregular mass in the outer left breast, seen only on the spot tangential view.  The breast tissue itself was fatty.  There were no suspicious calcifications.  This mass was palpable to Dr. [name].  An ultrasound showed it to be 1.7 cm irregular and hypoechoic.  There were at least two enlarged left axillary lymph nodes identified.  Dr. [name] recommended biopsy which was performed the same day and showed ([id number]) and invasive breast cancer felt most likely to be ductal with lobular features.  The left axillary lymph node biopsy also showed the same tumor (similar morphologic findings) and both prognostic profiles showed the cancer to be strongly ER and PR positive with a low to borderline proliferation fraction (13 and 14%).  Neither mass showed amplification of Her-2 by CISH.  (The ratios were 1.1 and 0.85.)    With this information, the patient was referred to Dr. [name] and bilateral breast MRIs were obtained March 1st.  This showed several left axillary lymph nodes with cortical thickening, the largest one measuring 1.1 cm.  The mass itself measured 2.9 cm by MRI.  It was spiculated and irregular.  There was no evidence of contralateral disease.   She was treated neoadjuvantly with docetaxel and cyclophosphamide for 6 cycles completed in July 2011, after which she underwent left lumpectomy and axillary dissection August 2011 for a ypT1b yTN3 (14 positive lymph nodes),  Stage IIIC, grade 1 residual tumor. HER-2/neu was repeated, again not amplified. After completing local regional radiation October 2011 she started letrozole November 2011, continued to September 2016, which she was found to have metastatic disease   METASTATIC DISEASE HISTORY: From the 06/08/2015 summary:   Maria Pruitt returns today for a new problem accompanied by her sister Maria Pruitt. To summarize the recent history: She had a hemorrhagic stroke in January 2016 with some residuals. She has been living next door to her son in the [place] area since then.--In August she presented to her primary care physician, Dr. Thornton Papas, with a complaint of abdominal discomfort, nausea and vomiting. He treated her for a UTI w/o resolution and obtained a KUB in his office; this was nondiagnostic. Accordingly he set up the patient for CT scan of the abdomen and pelvis 05/06/2015. This showed the left adrenal gland to be enlarged to 7.5 x 4.0 cm. There was eccentric wall thickening of the gastric cardia. There were small lymph nodes in the upper abdomen and retroperitoneum. There was no liver involvement. The right adrenal gland was unremarkable. There was severe left kidney atrophy (congenital).  The patient was then set up for biopsy of the left adrenal mass on 05/30/2015. This showed [name] [id number]) metastatic carcinoma which was cytokeratin 7 positive, with rare cytokeratin 20 positivity and was negative for gross cystic disease fluid protein. The prognostic panel showed this tumor to be estrogen and progesterone receptor negative, HER-2 negative, with an MIB-1 of 90-100%.   PET scan10/03/2015 which showed in additional to the  left adrenal mass, some regional lymph nodes and multiple bone lesions. There was no obvious primary tumor. She also had an EGD to evaluate a possible gastric primary suggested by the yearly a CT scan of the chest. This was normal. We also obtained tumor markerswhich showed a normal CA 19,a mildly  elevated CEA at 9.7,Maria Pruitt is significantly elevated CA-27-29 at 125. Given this data, the most likely interpretation is that we are dealing with an estrogen receptor negative recurrence of her earlier estrogen receptor positive breast cancer, now stage IV  Her subsequent history is as detailed below  INTERVAL HISTORY: Maria Pruitt returns today for follow-up of her triple negative breast cancer, accompanied by her daughter. Today is day 1 cycle 1 of carboplatin, which we intend to give on an every 14 day basis. However Maria Pruitt has developed a cough and she had a fever 2 days ago. Given the fact that treatment is palliative in this setting I think postponing therapy for one week is prudent.  REVIEW OF SYSTEMS: Shamel continues to improve and today was oriented 3. She does all her normal activities at home including some cleaning and cooking. Her children look over her medication because she still occasionally makes mistakes. On 10/28/2015 for example she took 2 oxycodone thinking that they were antibiotics. She had a fever at the time. She denies shortness of breath pleurisy or hemoptysis. She is bringing up a little bit of yellow phlegm especially in the morning. The cough is worse at night and it can keep her up. Aside from this she complains of a poor appetite and some arthritis pains here and there which are not more persistent or intense than before. She continues on Lovenox, with no evidence of bleeding  A detailed review of systems today was otherwise stable.  PAST MEDICAL HISTORY: Past Medical History  Diagnosis Date  . Hypertension   . Breast cancer ([place]) 2011    Stage 3  . Atrial fibrillation ([place])   . Hypercholesterolemia   . Abnormal ECG   . Allergy     seasonal  . Arthritis   . GERD (gastroesophageal reflux disease)   . 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 Seizures ([place])   . Stroke ([place])     dec 15  . Thyroid disease   . Adrenal cancer ([place])     1. Significant for pituitary adenoma which was removed in 1983 but recurred, requiring radiation and briefly some Parlodel.  She has been off treatment since 1985.  She has some acromegaly secondary to this tumor.   2. She is status post bilateral submuscular breast implants under Dr. Towanda Malkin in 1987.   3. She is status post C-section for her third child who was hydrocephalic.   4. She underwent rhinoplasty in 1984.   5. She has a history of hypertension. 6. History of mitral valve prolapse, but she says she has not been receiving antibiotics prior to dental or similar procedures.   7. History of hypothyroidism.   8. History of congenital absence of one kidney.   9. History of hyperlipidemia.  10. History of obesity.   11. History of renal stones.   12. History of fatty liver.   13. History of gout.   14. History of palpitations.   History of childhood asthma  PAST SURGICAL HISTORY: Past Surgical History  Procedure Laterality Date  . Cesarean section  1980  . Breast enhancement surgery  1987  . Umbilical hernia repair    .  Tubal ligation    . Scar revision    . Pituitary surgery      adenoma  . Rhinoplasty  1985  . Breast lumpectomy Left   . Colonoscopy      FAMILY HISTORY Family History  Problem Relation Age of Onset  . Heart disease Mother   . Heart attack Father   . Heart attack Brother   . Colon cancer Neg Hx    The patient's father died from a myocardial infarction at the age of 41.  The patient's mother died from complications of congestive heart failure at the age of 57.  The patient had one brother who died from a myocardial infarction at the age of 66.  One of the brothers and two sisters are alive and well.  Sr. Maria Pruitt is an ICU nurse. There is no history of breast or ovarian cancer in the family to her knowledge.  GYNECOLOGIC HISTORY: She is GX P3, first pregnancy to term at age 62.  She went through the change of life at the time of her pituitary surgery in  1983.  She took hormone replacement for less than a year because of poor tolerance.    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  She gave up her job in 08-15-2010.  Her first husband died from chronic myeloid leukemia in 1985.  Her three children from that marriage include her son Randall Hiss who died from complications of hydrocephaly at age 25; son [name] who is a Insurance underwriter and son [name] who is an Public house manager in this area.  The patient has five grandchildren.  Her second husband of 20+ years, Ron, is a Company secretary of an independent [employer] and also does Writer and is a Oceanographer. He is now disabled due toa traumatic brain injury. He lost his first wife to endometrial cancer.  He has a daughter from that marriage.  She lives in [place]: not in place  HEALTH MAINTENANCE: Social History  Substance Use Topics  . Smoking status: Never Smoker   . Smokeless tobacco: Never Used  . Alcohol Use: No     Colonoscopy:  PAP:  Bone density: January 2012/ osteopenia  Lipid panel:  Allergies  Allergen Reactions  . Ciprofloxacin Other (See Comments)  . Clonidine Derivatives Other (See Comments)  . Phenytoin     Other reaction(s): Other Elevated LFTs  . Sulfa Antibiotics Hives  . Benzonatate     REACTION: Swelling, tessalon perles; tolerates benadryl  . Codeine     REACTION: Nausea    Current Outpatient Prescriptions  Medication Sig Dispense Refill  . Ascorbic Acid (VITAMIN C) 100 MG tablet Take 1,000 mg by mouth daily.     Marland Kitchen aspirin (ASPIRIN CHILDRENS) 81 MG chewable tablet Chew 1 tablet (81 mg total) by mouth daily. 100 tablet 4  . calcium carbonate (TUMS - DOSED IN MG ELEMENTAL CALCIUM) 500 MG chewable tablet Chew 1 tablet (200 mg of elemental calcium total) by mouth 3 (three) times daily. 30 tablet 0  . enoxaparin (LOVENOX) 40 MG/0.4ML injection Inject 0.4 mLs (40 mg total) into the  skin daily. 30 Syringe 1  . famotidine (PEPCID) 20 MG tablet Take 1 tablet (20 mg total) by mouth 2 (two) times daily. 180 tablet 4  . hydrochlorothiazide (HYDRODIURIL) 25 MG tablet Take 1 tablet (25 mg total) by mouth daily.    Marland Kitchen lactulose (CHRONULAC) 10 GM/15ML  solution Take 45 mLs (30 g total) by mouth 2 (two) times daily. 240 mL 1  . levothyroxine (SYNTHROID, LEVOTHROID) 100 MCG tablet Take 88 mcg by mouth daily before breakfast.    . loratadine (CLARITIN) 10 MG tablet Take 10 mg by mouth daily.    Marland Kitchen losartan (COZAAR) 50 MG tablet Take 50 mg by mouth daily.    . metoprolol (LOPRESSOR) 50 MG tablet Take 50 mg by mouth 2 (two) times daily.    . Multiple Vitamin (MULTIVITAMIN WITH MINERALS) TABS tablet Take 1 tablet by mouth daily. 30 tablet 0  . ondansetron (ZOFRAN-ODT) 8 MG disintegrating tablet Take 1 tablet (8 mg total) by mouth 2 (two) times daily as needed for nausea or vomiting. (Patient not taking: Reported on 10/24/2015) 20 tablet 3  . oxyCODONE (OXY IR/ROXICODONE) 5 MG immediate release tablet Take 1 tablet (5 mg total) by mouth every 4 (four) hours as needed for severe pain. (Patient not taking: Reported on 10/24/2015) 30 tablet 0   No current facility-administered medications for this visit.   Facility-Administered Medications Ordered in Other Visits  Medication Dose Route Frequency Provider Last Rate Last Dose  . 0.9 %  sodium chloride infusion   Intravenous Once [name], MD        OBJECTIVE: Middle-aged white woman who appears stated age. Filed Vitals:   10/31/15 0938  BP: 134/74  Pulse: 59  Temp: 97.5 F (36.4 C)  Resp: 18     Body mass index is 28.89 kg/(m^2).    ECOG FS: 1  Sclerae unicteric, EOMs intact Oropharynx clear and moist No cervical or supraclavicular adenopathy Lungs no rales or rhonchi, good excursion bilaterally; the patient did call several times during the visit, not bringing up any phlegm Heart regular rate and rhythm Abd soft, nontender,  positive bowel sounds MSK no focal spinal tenderness, no upper extremity lymphedema Neuro: nonfocal, alert and oriented 3 (had to think about the year but, got it right, positive  affect Breasts: Deferred  LAB RESULTS: CBC Latest Ref Rng 10/31/2015 10/24/2015 10/17/2015  WBC 3.9 - 10.3 10e3/uL 4.4 5.4 5.1  Hemoglobin 11.6 - 15.9 g/dL 12.0 11.0(L) 11.1(L)  Hematocrit 34.8 - 46.6 % 36.3 32.5(L) 33.3(L)  Platelets 145 - 400 10e3/uL 170 213 174     CMP Latest Ref Rng 10/31/2015 10/24/2015 10/17/2015  Glucose 70 - 140 mg/dl 133 111 90  BUN 7.0 - 26.0 mg/dL 11.0 11.2 11.1  Creatinine 0.6 - 1.1 mg/dL 0.7 0.9 0.7  Sodium 136 - 145 mEq/L 135(L) 137 135(L)  Potassium 3.5 - 5.1 mEq/L 3.7 3.9 3.8  CO2 22 - 29 mEq/L 25 23 26   Calcium 8.4 - 10.4 mg/dL 8.5 8.2(L) 9.1  Total Protein 6.4 - 8.3 g/dL 6.5 6.2(L) 6.5  Total Bilirubin 0.20 - 1.20 mg/dL 2.09(H) 2.35(H) 3.22(H)  Alkaline Phos 40 - 150 U/L 548(H) 616(H) 775(H)  AST 5 - 34 U/L 101(H) 177(HH) 338(HH)  ALT 0 - 55 U/L 47 80(H) 132(H)    STUDIES: Mr 3d Recon At Scanner  2015-10-22  CLINICAL DATA:  Metastatic breast cancer. EXAM: MRI ABDOMEN WITHOUT AND WITH CONTRAST (INCLUDING MRCP) TECHNIQUE: Multiplanar multisequence MR imaging of the abdomen was performed both before and after the administration of intravenous contrast. Heavily T2-weighted images of the biliary and pancreatic ducts were obtained, and three-dimensional MRCP images were rendered by post processing. CONTRAST:  25m MULTIHANCE GADOBENATE DIMEGLUMINE 529 MG/ML IV SOLN COMPARISON:  MRI 08/23/2015 FINDINGS: Examination is limited by breathing motion artifact.  Lower chest: No obvious lung base lesions or pleural effusion. No pericardial effusion. Bilateral breast prosthesis noted. Hepatobiliary: Stable cirrhotic changes involving the liver. No early arterial phase enhancing lesions to suggest dysplastic nodule or HCC. No intrahepatic biliary dilatation. Normal caliber and course of the common  bile duct. Normal gallbladder. Pancreas: No pancreatic mass, inflammation or ductal dilatation. Spleen: Normal size.  No focal lesions. Adrenals/Urinary Tract: The left adrenal gland mass is smaller. It previously measured 43.5 x 20.5 mm and now measures 35 x 11.5 mm. 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 The right kidney demonstrates compensatory hypertrophy. No renal lesions or hydronephrosis. Stomach/Bowel: The stomach, duodenum, visualized small bowel and visualize colon are grossly normal. Vascular/Lymphatic: Persistent but improved retroperitoneal lymphadenopathy. Largest nodal mass between the atretic left kidney and the aorta measures 30.5 x 25.5 mm on series 1102, image 60. This previously measured 47 x 39 mm. The aorta and branch vessels are patent. The major venous structures are patent. Other: No ascites. Stable anterior abdominal wall hernia containing fat. Musculoskeletal: No significant change in L2 vertebral body lesion. No new osseous lesions. IMPRESSION: 1. Stable cirrhotic changes involving the liver. No worrisome hepatic lesions. 2. Interval decrease in size of the left adrenal mass and retroperitoneal lymphadenopathy. 3. Stable L2 metastatic lesion. Electronically Signed   By: Marijo Sanes M.D.   On: 10/02/2015 13:53   Mr Abd W/wo Cm/mrcp  10/02/2015  CLINICAL DATA:  Metastatic breast cancer. EXAM: MRI ABDOMEN WITHOUT AND WITH CONTRAST (INCLUDING MRCP) TECHNIQUE: Multiplanar multisequence MR imaging of the abdomen was performed both before and after the administration of intravenous contrast. Heavily T2-weighted images of the biliary and pancreatic ducts were obtained, and three-dimensional MRCP images were rendered by post processing. CONTRAST:  46m MULTIHANCE GADOBENATE DIMEGLUMINE 529 MG/ML IV SOLN COMPARISON:  MRI 08/23/2015 FINDINGS: Examination is limited by breathing motion artifact. Lower chest: No obvious lung base lesions or pleural effusion. No pericardial effusion. Bilateral  breast prosthesis noted. Hepatobiliary: Stable cirrhotic changes involving the liver. No early arterial phase enhancing lesions to suggest dysplastic nodule or HCC. No intrahepatic biliary dilatation. Normal caliber and course of the common bile duct. Normal gallbladder. Pancreas: No pancreatic mass, inflammation or ductal dilatation. Spleen: Normal size.  No focal lesions. Adrenals/Urinary Tract: The left adrenal gland mass is smaller. It previously measured 43.5 x 20.5 mm and now measures 35 x 11.5 mm. 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 The right kidney demonstrates compensatory hypertrophy. No renal lesions or hydronephrosis. Stomach/Bowel: The stomach, duodenum, visualized small bowel and visualize colon are grossly normal. Vascular/Lymphatic: Persistent but improved retroperitoneal lymphadenopathy. Largest nodal mass between the atretic left kidney and the aorta measures 30.5 x 25.5 mm on series 1102, image 60. This previously measured 47 x 39 mm. The aorta and branch vessels are patent. The major venous structures are patent. Other: No ascites. Stable anterior abdominal wall hernia containing fat. Musculoskeletal: No significant change in L2 vertebral body lesion. No new osseous lesions. IMPRESSION: 1. Stable cirrhotic changes involving the liver. No worrisome hepatic lesions. 2. Interval decrease in size of the left adrenal mass and retroperitoneal lymphadenopathy. 3. Stable L2 metastatic lesion. Electronically Signed   By: PMarijo SanesM.D.   On: 10/02/2015 13:53    ASSESSMENT: 70[place]  (1) status post left breast biopsy in February of 2011 for an invasive lobular carcinoma measuring 2.9 cm by MRI, with a positive prechemotherapy axillary lymph node biopsy, strongly estrogen and progesterone receptor positive with no  evidence of HER-2/neu amplification and an MIB-1 of 14%.   (2) received 6 cycles of docetaxel/ cyclophosphamide in the neoadjuvant setting, completed  in mid July of 2011   (3) s/p left lumpectomy and axillary lymph node dissection in August of 2011 for a ypT1b yTN3 (14 positive lymph nodes), Stage IIIC, grade 1 residual tumor. HER-2/neu was repeated, again not amplified.   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 Discontinued 08/29/15 with progression  (8) denosumab/ Xgeva started 07/18/2015, repeated monthly  (9) eribulin D1/D8 q 21 days started 09/23/14. 1 complete cycle given before hospitalization. Discontinued due to elevation in LFTs.   (10) hospitalized for infuenza A on 09/26/15. Pulmonary embolus found. Lovenox started daily.  (11) hepatic encephalopathy during hospital stay in January 2017. Lactulose completed. Awaiting approval of rifaximin due to financial strain.   (12) carboplatin Q14 days to start 11/07/2015  OTHER PROBLEMS: (a) the patient is status post hemorrhagic CVA January 2016  (b) congenital absence of left kidney  PLAN:  We could have started carboplatin today but I see no reason to take a chance and says she has a cough still is productive of some yellow phlegm in the morning I think the  better part of valor is to wait another week. She is a little disappointment but agreeable.  I do not think this delay will make any difference to her ultimate course.  Clinically she continues to improve and although she still has a high ammonia level, and is no longer lactulose, she is much closer to baseline. I am hopeful she will be able to tolerate the carboplatin without worsening the liver problems understanding that as cancer cells die in the liver they do cause inflammation in pseudocirrhosis.  I went ahead and gave her daughter my beeper number so they can call me she develops a fever, in which case we would start her on a Z-Pak.  A note of call for any other issues that may develop before her next visit here.   [name], MD 10/31/2015 1:09 PM  Medical Oncology and Hematology [facility] [address] [place], [place] [address] Tel. [phone]    Fax. [phone]

## 2015-10-31 NOTE — Patient Instructions (Signed)

## 2015-10-31 NOTE — Telephone Encounter (Signed)
appt made and avs printed °

## 2015-10-31 NOTE — Telephone Encounter (Signed)
This RN received a VM from pt's sister stating " the port is still accessed ".  Pt came in this AM for lab/port access per flush and visit with treatment- Treatment was cancelled.  This RN returned call to [facility] and discussed need for port needle to be removed asap due to dressing not applied for long term use.  Di Kindle is unable to bring pt in presently but will arrange for pt to come in AM for port de- access.

## 2015-11-01 ENCOUNTER — Ambulatory Visit (HOSPITAL_BASED_OUTPATIENT_CLINIC_OR_DEPARTMENT_OTHER): Payer: Medicare Other

## 2015-11-01 DIAGNOSIS — Z95828 Presence of other vascular implants and grafts: Secondary | ICD-10-CM

## 2015-11-01 DIAGNOSIS — C50912 Malignant neoplasm of unspecified site of left female breast: Secondary | ICD-10-CM | POA: Diagnosis not present

## 2015-11-01 DIAGNOSIS — Z452 Encounter for adjustment and management of vascular access device: Secondary | ICD-10-CM | POA: Diagnosis present

## 2015-11-01 MED ORDER — HEPARIN SOD (PORK) LOCK FLUSH 100 UNIT/ML IV SOLN
500.0000 [IU] | Freq: Once | INTRAVENOUS | Status: AC
  Administered 2015-11-01: 500 [IU] via INTRAVENOUS
  Filled 2015-11-01: qty 5

## 2015-11-01 MED ORDER — SODIUM CHLORIDE 0.9% FLUSH
10.0000 mL | INTRAVENOUS | Status: DC | PRN
  Administered 2015-11-01: 10 mL via INTRAVENOUS
  Filled 2015-11-01: qty 10

## 2015-11-01 NOTE — Patient Instructions (Signed)

## 2015-11-02 ENCOUNTER — Telehealth: Payer: Self-pay

## 2015-11-02 NOTE — Telephone Encounter (Signed)
Di Kindle called requesting/ reminding Dr Jana Hakim of a lovenox refill [facility] in [place].

## 2015-11-03 ENCOUNTER — Other Ambulatory Visit: Payer: Self-pay | Admitting: *Deleted

## 2015-11-03 DIAGNOSIS — I2609 Other pulmonary embolism with acute cor pulmonale: Secondary | ICD-10-CM

## 2015-11-03 MED ORDER — ENOXAPARIN SODIUM 40 MG/0.4ML ~~LOC~~ SOLN: 40.0000 mg | SUBCUTANEOUS | Status: DC

## 2015-11-07 ENCOUNTER — Telehealth: Payer: Self-pay

## 2015-11-07 ENCOUNTER — Other Ambulatory Visit (HOSPITAL_BASED_OUTPATIENT_CLINIC_OR_DEPARTMENT_OTHER): Payer: Medicare Other

## 2015-11-07 ENCOUNTER — Other Ambulatory Visit: Payer: Self-pay | Admitting: Oncology

## 2015-11-07 ENCOUNTER — Ambulatory Visit (HOSPITAL_BASED_OUTPATIENT_CLINIC_OR_DEPARTMENT_OTHER): Payer: Medicare Other

## 2015-11-07 ENCOUNTER — Ambulatory Visit: Payer: Medicare Other

## 2015-11-07 ENCOUNTER — Ambulatory Visit (HOSPITAL_BASED_OUTPATIENT_CLINIC_OR_DEPARTMENT_OTHER): Payer: Medicare Other | Admitting: Oncology

## 2015-11-07 ENCOUNTER — Other Ambulatory Visit: Payer: Self-pay | Admitting: *Deleted

## 2015-11-07 ENCOUNTER — Encounter: Payer: Self-pay | Admitting: Oncology

## 2015-11-07 VITALS — BP 164/78 | HR 60 | Temp 98.0°F | Resp 18 | Ht 64.0 in | Wt 169.8 lb

## 2015-11-07 DIAGNOSIS — C7972 Secondary malignant neoplasm of left adrenal gland: Secondary | ICD-10-CM

## 2015-11-07 DIAGNOSIS — C50412 Malignant neoplasm of upper-outer quadrant of left female breast: Secondary | ICD-10-CM

## 2015-11-07 DIAGNOSIS — C50912 Malignant neoplasm of unspecified site of left female breast: Secondary | ICD-10-CM

## 2015-11-07 DIAGNOSIS — C773 Secondary and unspecified malignant neoplasm of axilla and upper limb lymph nodes: Secondary | ICD-10-CM | POA: Diagnosis not present

## 2015-11-07 DIAGNOSIS — I4819 Other persistent atrial fibrillation: Secondary | ICD-10-CM

## 2015-11-07 DIAGNOSIS — Z95828 Presence of other vascular implants and grafts: Secondary | ICD-10-CM

## 2015-11-07 DIAGNOSIS — K7460 Unspecified cirrhosis of liver: Secondary | ICD-10-CM | POA: Insufficient documentation

## 2015-11-07 DIAGNOSIS — Z5111 Encounter for antineoplastic chemotherapy: Secondary | ICD-10-CM | POA: Diagnosis not present

## 2015-11-07 DIAGNOSIS — I2699 Other pulmonary embolism without acute cor pulmonale: Secondary | ICD-10-CM

## 2015-11-07 DIAGNOSIS — E278 Other specified disorders of adrenal gland: Secondary | ICD-10-CM

## 2015-11-07 DIAGNOSIS — Z7901 Long term (current) use of anticoagulants: Secondary | ICD-10-CM | POA: Diagnosis not present

## 2015-11-07 DIAGNOSIS — C7951 Secondary malignant neoplasm of bone: Secondary | ICD-10-CM

## 2015-11-07 DIAGNOSIS — C50812 Malignant neoplasm of overlapping sites of left female breast: Secondary | ICD-10-CM

## 2015-11-07 LAB — CBC WITH DIFFERENTIAL/PLATELET
BASO%: 1.8 % (ref 0.0–2.0)
BASOS ABS: 0.1 10*3/uL (ref 0.0–0.1)
EOS%: 6.8 % (ref 0.0–7.0)
Eosinophils Absolute: 0.3 10*3/uL (ref 0.0–0.5)
HEMATOCRIT: 35.4 % (ref 34.8–46.6)
HEMOGLOBIN: 11.8 g/dL (ref 11.6–15.9)
LYMPH#: 1.2 10*3/uL (ref 0.9–3.3)
LYMPH%: 27.7 % (ref 14.0–49.7)
MCH: 36.1 pg — AB (ref 25.1–34.0)
MCHC: 33.3 g/dL (ref 31.5–36.0)
MCV: 108.3 fL — ABNORMAL HIGH (ref 79.5–101.0)
MONO#: 0.6 10*3/uL (ref 0.1–0.9)
MONO%: 12.4 % (ref 0.0–14.0)
NEUT#: 2.3 10*3/uL (ref 1.5–6.5)
NEUT%: 51.3 % (ref 38.4–76.8)
PLATELETS: 162 10*3/uL (ref 145–400)
RBC: 3.27 10*6/uL — ABNORMAL LOW (ref 3.70–5.45)
RDW: 14.7 % — AB (ref 11.2–14.5)
WBC: 4.4 10*3/uL (ref 3.9–10.3)

## 2015-11-07 LAB — COMPREHENSIVE METABOLIC PANEL
ALBUMIN: 2.7 g/dL — AB (ref 3.5–5.0)
ALK PHOS: 487 U/L — AB (ref 40–150)
ALT: 49 U/L (ref 0–55)
ANION GAP: 6 meq/L (ref 3–11)
AST: 106 U/L — AB (ref 5–34)
BUN: 10.1 mg/dL (ref 7.0–26.0)
CALCIUM: 8.2 mg/dL — AB (ref 8.4–10.4)
CHLORIDE: 103 meq/L (ref 98–109)
CO2: 24 mEq/L (ref 22–29)
CREATININE: 0.7 mg/dL (ref 0.6–1.1)
EGFR: 83 mL/min/{1.73_m2} — ABNORMAL LOW (ref >90)
Glucose: 91 mg/dl (ref 70–140)
Potassium: 4.2 mEq/L (ref 3.5–5.1)
Sodium: 133 mEq/L — ABNORMAL LOW (ref 136–145)
Total Bilirubin: 1.81 mg/dL — ABNORMAL HIGH (ref 0.20–1.20)
Total Protein: 6.3 g/dL — ABNORMAL LOW (ref 6.4–8.3)

## 2015-11-07 MED ORDER — METOCLOPRAMIDE HCL 5 MG PO TABS: 5.0000 mg | ORAL_TABLET | Freq: Three times a day (TID) | ORAL | Status: DC | PRN

## 2015-11-07 MED ORDER — SODIUM CHLORIDE 0.9 % IV SOLN
Freq: Once | INTRAVENOUS | Status: AC
  Administered 2015-11-07: 14:00:00 via INTRAVENOUS

## 2015-11-07 MED ORDER — SODIUM CHLORIDE 0.9% FLUSH
10.0000 mL | INTRAVENOUS | Status: DC | PRN
  Administered 2015-11-07: 10 mL
  Filled 2015-11-07: qty 10

## 2015-11-07 MED ORDER — HEPARIN SOD (PORK) LOCK FLUSH 100 UNIT/ML IV SOLN
500.0000 [IU] | Freq: Once | INTRAVENOUS | Status: AC | PRN
  Administered 2015-11-07: 500 [IU]
  Filled 2015-11-07: qty 5

## 2015-11-07 MED ORDER — SODIUM CHLORIDE 0.9% FLUSH
10.0000 mL | INTRAVENOUS | Status: DC | PRN
  Administered 2015-11-07: 10 mL via INTRAVENOUS
  Filled 2015-11-07: qty 10

## 2015-11-07 MED ORDER — SODIUM CHLORIDE 0.9 % IV SOLN
10.0000 mg | Freq: Once | INTRAVENOUS | Status: AC
  Administered 2015-11-07: 10 mg via INTRAVENOUS
  Filled 2015-11-07: qty 1

## 2015-11-07 MED ORDER — PALONOSETRON HCL INJECTION 0.25 MG/5ML
0.2500 mg | Freq: Once | INTRAVENOUS | Status: AC
  Administered 2015-11-07: 0.25 mg via INTRAVENOUS

## 2015-11-07 MED ORDER — PALONOSETRON HCL INJECTION 0.25 MG/5ML
INTRAVENOUS | Status: AC
  Filled 2015-11-07: qty 5

## 2015-11-07 MED ORDER — SODIUM CHLORIDE 0.9 % IV SOLN
138.0000 mg | Freq: Once | INTRAVENOUS | Status: AC
  Administered 2015-11-07: 140 mg via INTRAVENOUS
  Filled 2015-11-07: qty 14

## 2015-11-07 NOTE — Telephone Encounter (Signed)
Patient called stating that she is being treated for a UTI since last week.  She states that she is feeling well however was unsure about keeping her lab, MD and infusion about today.  Writer encouraged her to come in and have labs drawn and see Dr. Jana Hakim - at which time he will assess whether she can go forward with treatment.  Patient's son stated understanding and will bring patient in for scheduled appts.

## 2015-11-07 NOTE — Patient Instructions (Addendum)
[facility] Discharge Instructions for Patients Receiving Chemotherapy  Today you received the following chemotherapy agents: Carboplatin.  To help prevent nausea and vomiting after your treatment, we encourage you to take your nausea medication: Zofran 8 mg every 8 hours as needed.   If you develop nausea and vomiting that is not controlled by your nausea medication, call the clinic.   BELOW ARE SYMPTOMS THAT SHOULD BE REPORTED IMMEDIATELY:  *FEVER GREATER THAN 100.5 F  *CHILLS WITH OR WITHOUT FEVER  NAUSEA AND VOMITING THAT IS NOT CONTROLLED WITH YOUR NAUSEA MEDICATION  *UNUSUAL SHORTNESS OF BREATH  *UNUSUAL BRUISING OR BLEEDING  TENDERNESS IN MOUTH AND THROAT WITH OR WITHOUT PRESENCE OF ULCERS  *URINARY PROBLEMS  *BOWEL PROBLEMS  UNUSUAL RASH Items with * indicate a potential emergency and should be followed up as soon as possible.  Feel free to call the clinic you have any questions or concerns. The clinic phone number is [phone].  Please show the [place] at check-in to the Emergency Department and triage nurse.  Carboplatin injection What is this medicine? CARBOPLATIN (KAR boe pla tin) is a chemotherapy drug. It targets fast dividing cells, like cancer cells, and causes these cells to die. This medicine is used to treat ovarian cancer and many other cancers. This medicine may be used for other purposes; ask your health care provider or pharmacist if you have questions. What should I tell my health care provider before I take this medicine? They need to know if you have any of these conditions: -blood disorders -hearing problems -kidney disease -recent or ongoing radiation therapy -an unusual or allergic reaction to carboplatin, cisplatin, other chemotherapy, other medicines, foods, dyes, or preservatives -pregnant or trying to get pregnant -breast-feeding How should I use this medicine? This drug is usually given as an infusion into a  vein. It is administered in a hospital or clinic by a specially trained health care professional. Talk to your pediatrician regarding the use of this medicine in children. Special care may be needed. Overdosage: If you think you have taken too much of this medicine contact a poison control center or emergency room at once. NOTE: This medicine is only for you. Do not share this medicine with others. What if I miss a dose? It is important not to miss a dose. Call your doctor or health care professional if you are unable to keep an appointment. What may interact with this medicine? -medicines for seizures -medicines to increase blood counts like filgrastim, pegfilgrastim, sargramostim -some antibiotics like amikacin, gentamicin, neomycin, streptomycin, tobramycin -vaccines Talk to your doctor or health care professional before taking any of these medicines: -acetaminophen -aspirin -ibuprofen -ketoprofen -naproxen This list may not describe all possible interactions. Give your health care provider a list of all the medicines, herbs, non-prescription drugs, or dietary supplements you use. Also tell them if you smoke, drink alcohol, or use illegal drugs. Some items may interact with your medicine. What should I watch for while using this medicine? Your condition will be monitored carefully while you are receiving this medicine. You will need important blood work done while you are taking this medicine. This drug may make you feel generally unwell. This is not uncommon, as chemotherapy can affect healthy cells as well as cancer cells. Report any side effects. Continue your course of treatment even though you feel ill unless your doctor tells you to stop. In some cases, you may be given additional medicines to help with side effects. Follow  all directions for their use. Call your doctor or health care professional for advice if you get a fever, chills or sore throat, or other symptoms of a cold or flu.  Do not treat yourself. This drug decreases your body's ability to fight infections. Try to avoid being around people who are sick. This medicine may increase your risk to bruise or bleed. Call your doctor or health care professional if you notice any unusual bleeding. Be careful brushing and flossing your teeth or using a toothpick because you may get an infection or bleed more easily. If you have any dental work done, tell your dentist you are receiving this medicine. Avoid taking products that contain aspirin, acetaminophen, ibuprofen, naproxen, or ketoprofen unless instructed by your doctor. These medicines may hide a fever. Do not become pregnant while taking this medicine. Women should inform their doctor if they wish to become pregnant or think they might be pregnant. There is a potential for serious side effects to an unborn child. Talk to your health care professional or pharmacist for more information. Do not breast-feed an infant while taking this medicine. What side effects may I notice from receiving this medicine? Side effects that you should report to your doctor or health care professional as soon as possible: -allergic reactions like skin rash, itching or hives, swelling of the face, lips, or tongue -signs of infection - fever or chills, cough, sore throat, pain or difficulty passing urine -signs of decreased platelets or bleeding - bruising, pinpoint red spots on the skin, black, tarry stools, nosebleeds -signs of decreased red blood cells - unusually weak or tired, fainting spells, lightheadedness -breathing problems -changes in hearing -changes in vision -chest pain -high blood pressure -low blood counts - This drug may decrease the number of white blood cells, red blood cells and platelets. You may be at increased risk for infections and bleeding. -nausea and vomiting -pain, swelling, redness or irritation at the injection site -pain, tingling, numbness in the hands or  feet -problems with balance, talking, walking -trouble passing urine or change in the amount of urine Side effects that usually do not require medical attention (report to your doctor or health care professional if they continue or are bothersome): -hair loss -loss of appetite -metallic taste in the mouth or changes in taste This list may not describe all possible side effects. Call your doctor for medical advice about side effects. You may report side effects to FDA at 1-800-FDA-1088. Where should I keep my medicine? This drug is given in a hospital or clinic and will not be stored at home. NOTE: This sheet is a summary. It may not cover all possible information. If you have questions about this medicine, talk to your doctor, pharmacist, or health care provider.    2016, Elsevier/Gold Standard. (2007-12-02 14:38:05)

## 2015-11-07 NOTE — Progress Notes (Signed)
Patient's son came in to bring proof of income for patient while she was in treatment. He also wanted to pay her balance as of today. He states he would come back when she was done with treatment. Approved patient for $1000 [employer]. Patient and son came back after treatment and Lenise assisted them. Patient signed grant approval. Copy was given to patient as well as expenses it covers. Patient has my card for any additional questions or concerns. Lenise accepted payment and gave receipt.

## 2015-11-07 NOTE — Progress Notes (Signed)
. [name]   DOB: 1946/07/01  MR#: [id number]  TML#:[id number]  [name], PA-C GYN:  SU:  OTHER MD:  CHIEF COMPLAINT: left adrenal metastatsis  CURRENT TREATMENT: denosumab, carboplatin  BREAST CANCER HISTORY:  from the original intake note:  Kaeleen herself palpated a change in her left breast, shortly before her mammogram was due.  She brought this to [name] attention and he set her up for diagnostic mammography on October 31, 2009.   Routine and implant displaced views of both breasts showed a 2 cm irregular mass in the outer left breast, seen only on the spot tangential view.  The breast tissue itself was fatty.  There were no suspicious calcifications.  This mass was palpable to Dr. [name].  An ultrasound showed it to be 1.7 cm irregular and hypoechoic.  There were at least two enlarged left axillary lymph nodes identified.  Dr. [name] recommended biopsy which was performed the same day and showed ([id number]) and invasive breast cancer felt most likely to be ductal with lobular features.  The left axillary lymph node biopsy also showed the same tumor (similar morphologic findings) and both prognostic profiles showed the cancer to be strongly ER and PR positive with a low to borderline proliferation fraction (13 and 14%).  Neither mass showed amplification of Her-2 by CISH.  (The ratios were 1.1 and 0.85.)    With this information, the patient was referred to Dr. [name] and bilateral breast MRIs were obtained March 1st.  This showed several left axillary lymph nodes with cortical thickening, the largest one measuring 1.1 cm.  The mass itself measured 2.9 cm by MRI.  It was spiculated and irregular.  There was no evidence of contralateral disease.   She was treated neoadjuvantly with docetaxel and cyclophosphamide for 6 cycles completed in July 2011, after which she underwent left lumpectomy and axillary dissection August 2011 for a ypT1b yTN3 (14 positive lymph nodes),  Stage IIIC, grade 1 residual tumor. HER-2/neu was repeated, again not amplified. After completing local regional radiation October 2011 she started letrozole November 2011, continued to September 2016, which she was found to have metastatic disease   METASTATIC DISEASE HISTORY: From the 06/08/2015 summary:   Chiquitta returns today for a new problem accompanied by her sister [name]. To summarize the recent history: She had a hemorrhagic stroke in January 2016 with some residuals. She has been living next door to her son in the [place] area since then.--In August she presented to her primary care physician, Dr. Thornton Papas, with a complaint of abdominal discomfort, nausea and vomiting. He treated her for a UTI w/o resolution and obtained a KUB in his office; this was nondiagnostic. Accordingly he set up the patient for CT scan of the abdomen and pelvis 05/06/2015. This showed the left adrenal gland to be enlarged to 7.5 x 4.0 cm. There was eccentric wall thickening of the gastric cardia. There were small lymph nodes in the upper abdomen and retroperitoneum. There was no liver involvement. The right adrenal gland was unremarkable. There was severe left kidney atrophy (congenital).  The patient was then set up for biopsy of the left adrenal mass on 05/30/2015. This showed [name] [id number]) metastatic carcinoma which was cytokeratin 7 positive, with rare cytokeratin 20 positivity and was negative for gross cystic disease fluid protein. The prognostic panel showed this tumor to be estrogen and progesterone receptor negative, HER-2 negative, with an MIB-1 of 90-100%.   PET scan10/03/2015 which showed in additional to the  left adrenal mass, some regional lymph nodes and multiple bone lesions. There was no obvious primary tumor. She also had an EGD to evaluate a possible gastric primary suggested by the yearly a CT scan of the chest. This was normal. We also obtained tumor markerswhich showed a normal CA 19,a mildly  elevated CEA at 9.7,[name] is significantly elevated CA-27-29 at 125. Given this data, the most likely interpretation is that we are dealing with an estrogen receptor negative recurrence of her earlier estrogen receptor positive breast cancer, now stage IV  Her subsequent history is as detailed below  INTERVAL HISTORY: Jaklyn returns today for follow-up of her Metastatic breast cancer, accompanied by one of her sons. Today is day 1 cycle 1 of carboplatin this has been repeatedly postponed because the patient has seemed to be too frail or too confused but today we are finally ready to proceed. Her mind as finally cleared. She was able to give me totally the year but the month and day. She was very sharp in her decision making and reports regarding her overall situation. The plan is to do carboplatin possibly day 1 and day 8 of each 21 day cycle, or more likely once every 14 days. We are continuing monthly denosumab as before  REVIEW OF SYSTEMS: Viva had a cough last visit, then developed symptoms consisted with a urinary tract infection. She brought these to her primary care physician and was started on Cipro. 3 days later though she was called and told that she did not have a urinary tract infection. She has already had 3 days of ciprofloxacin. I instructed her to go ahead and stop the Cipro at this point. She tells me though that it also cleared her cough, which is very favorable. Aside from these issues, a detailed review of systems today was stable  PAST MEDICAL HISTORY: Past Medical History  Diagnosis Date  . Hypertension   . Breast cancer ([facility]) 2011    Stage 3  . Atrial fibrillation ([facility])   . Hypercholesterolemia   . Abnormal ECG   . Allergy     seasonal  . Arthritis   . GERD (gastroesophageal reflux disease)   . 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 Seizures ([facility])   . Stroke ([facility])     dec 15  . Thyroid disease   . Adrenal cancer ([facility])    1. Significant for  pituitary adenoma which was removed in 1983 but recurred, requiring radiation and briefly some Parlodel.  She has been off treatment since 1985.  She has some acromegaly secondary to this tumor.   2. She is status post bilateral submuscular breast implants under Dr. Towanda Malkin in 1987.   3. She is status post C-section for her third child who was hydrocephalic.   4. She underwent rhinoplasty in 1984.   5. She has a history of hypertension. 6. History of mitral valve prolapse, but she says she has not been receiving antibiotics prior to dental or similar procedures.   7. History of hypothyroidism.   8. History of congenital absence of one kidney.   9. History of hyperlipidemia.  10. History of obesity.   11. History of renal stones.   12. History of fatty liver.   13. History of gout.   14. History of palpitations.   History of childhood asthma  PAST SURGICAL HISTORY: Past Surgical History  Procedure Laterality Date  . Cesarean section  1980  . Breast enhancement surgery  4818  . Umbilical hernia repair    . Tubal ligation    . Scar revision    . Pituitary surgery      adenoma  . Rhinoplasty  1985  . Breast lumpectomy Left   . Colonoscopy      FAMILY HISTORY Family History  Problem Relation Age of Onset  . Heart disease Mother   . Heart attack Father   . Heart attack Brother   . Colon cancer Neg Hx    The patient's father died from a myocardial infarction at the age of 74.  The patient's mother died from complications of congestive heart failure at the age of 28.  The patient had one brother who died from a myocardial infarction at the age of 48.  One of the brothers and two sisters are alive and well.  Sr. [name] is an ICU nurse. There is no history of breast or ovarian cancer in the family to her knowledge.  GYNECOLOGIC HISTORY: She is GX P3, first pregnancy to term at age 32.  She went through the change of life at the time of her pituitary surgery in 1983.  She took  hormone replacement for less than a year because of poor tolerance.    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  She gave up her job in 08-21-10.  Her first husband died from chronic myeloid leukemia in 1985.  Her three children from that marriage include her son Randall Hiss who died from complications of hydrocephaly at age 69; son [name] who is a Insurance underwriter and son [name] who is an Public house manager in this area.  The patient has five grandchildren.  Her second husband of 20+ years, Ron, is a Company secretary of an independent [employer] and also does Writer and is a Oceanographer. He is now disabled due toa traumatic brain injury. He lost his first wife to endometrial cancer.  He has a daughter from that marriage.  She lives in [place]: not in place  HEALTH MAINTENANCE: Social History  Substance Use Topics  . Smoking status: Never Smoker   . Smokeless tobacco: Never Used  . Alcohol Use: No     Colonoscopy:  PAP:  Bone density: January 2012/ osteopenia  Lipid panel:  Allergies  Allergen Reactions  . Ciprofloxacin Other (See Comments)  . Clonidine Derivatives Other (See Comments)  . Phenytoin     Other reaction(s): Other Elevated LFTs  . Sulfa Antibiotics Hives  . Benzonatate     REACTION: Swelling, tessalon perles; tolerates benadryl  . Codeine     REACTION: Nausea    Current Outpatient Prescriptions  Medication Sig Dispense Refill  . Ascorbic Acid (VITAMIN C) 100 MG tablet Take 1,000 mg by mouth daily.     Marland Kitchen aspirin (ASPIRIN CHILDRENS) 81 MG chewable tablet Chew 1 tablet (81 mg total) by mouth daily. 100 tablet 4  . calcium carbonate (TUMS - DOSED IN MG ELEMENTAL CALCIUM) 500 MG chewable tablet Chew 1 tablet (200 mg of elemental calcium total) by mouth 3 (three) times daily. 30 tablet 0  . enoxaparin (LOVENOX) 40 MG/0.4ML injection Inject 0.4 mLs (40 mg total) into the skin daily. 30  Syringe 1  . famotidine (PEPCID) 20 MG tablet Take 1 tablet (20 mg total) by mouth 2 (two) times daily. 180 tablet 4  . hydrochlorothiazide (HYDRODIURIL) 25 MG tablet Take 1 tablet (25 mg total) by  mouth daily.    Marland Kitchen lactulose (CHRONULAC) 10 GM/15ML solution Take 45 mLs (30 g total) by mouth 2 (two) times daily. 240 mL 1  . levothyroxine (SYNTHROID, LEVOTHROID) 100 MCG tablet Take 88 mcg by mouth daily before breakfast.    . loratadine (CLARITIN) 10 MG tablet Take 10 mg by mouth daily.    Marland Kitchen losartan (COZAAR) 50 MG tablet Take 50 mg by mouth daily.    . metoCLOPramide (REGLAN) 5 MG tablet Take 1 tablet (5 mg total) by mouth 3 (three) times daily as needed for nausea. 30 tablet 1  . metoprolol (LOPRESSOR) 50 MG tablet Take 50 mg by mouth 2 (two) times daily.    . Multiple Vitamin (MULTIVITAMIN WITH MINERALS) TABS tablet Take 1 tablet by mouth daily. 30 tablet 0  . oxyCODONE (OXY IR/ROXICODONE) 5 MG immediate release tablet Take 1 tablet (5 mg total) by mouth every 4 (four) hours as needed for severe pain. (Patient not taking: Reported on 10/24/2015) 30 tablet 0   No current facility-administered medications for this visit.   Facility-Administered Medications Ordered in Other Visits  Medication Dose Route Frequency Provider Last Rate Last Dose  . 0.9 %  sodium chloride infusion   Intravenous Once [name], MD      . sodium chloride flush (NS) 0.9 % injection 10 mL  10 mL Intracatheter PRN [name], MD   10 mL at 11/07/15 1541    OBJECTIVE: Middle-aged white woman in no acute distress Filed Vitals:   11/07/15 1301  BP: 164/78  Pulse: 60  Temp: 98 F (36.7 C)  Resp: 18     Body mass index is 29.13 kg/(m^2).    ECOG FS: 1  Sclerae unicteric, pupils round and equal Oropharynx clear and moist-- no thrush or other lesions No cervical or supraclavicular adenopathy Lungs no rales or rhonchi Heart regular rate and rhythm Abd soft, nontender, positive bowel sounds, no masses  palpated MSK no focal spinal tenderness, no upper extremity lymphedema Neuro: nonfocal, A&O x3, appropriate affect Breasts: deferred    LAB RESULTS: CBC Latest Ref Rng 11/07/2015 10/31/2015 10/24/2015  WBC 3.9 - 10.3 10e3/uL 4.4 4.4 5.4  Hemoglobin 11.6 - 15.9 g/dL 11.8 12.0 11.0(L)  Hematocrit 34.8 - 46.6 % 35.4 36.3 32.5(L)  Platelets 145 - 400 10e3/uL 162 170 213     CMP Latest Ref Rng 11/07/2015 10/31/2015 10/24/2015  Glucose 70 - 140 mg/dl 91 133 111  BUN 7.0 - 26.0 mg/dL 10.1 11.0 11.2  Creatinine 0.6 - 1.1 mg/dL 0.7 0.7 0.9  Sodium 136 - 145 mEq/L 133(L) 135(L) 137  Potassium 3.5 - 5.1 mEq/L 4.2 3.7 3.9  CO2 22 - 29 mEq/L 24 25 23   Calcium 8.4 - 10.4 mg/dL 8.2(L) 8.5 8.2(L)  Total Protein 6.4 - 8.3 g/dL 6.3(L) 6.5 6.2(L)  Total Bilirubin 0.20 - 1.20 mg/dL 1.81(H) 2.09(H) 2.35(H)  Alkaline Phos 40 - 150 U/L 487(H) 548(H) 616(H)  AST 5 - 34 U/L 106(H) 101(H) 177(HH)  ALT 0 - 55 U/L 49 47 80(H)    STUDIES: No results found.  ASSESSMENT: 70 y.o. [place], [place] woman  (1) status post left breast biopsy in February of 2011 for an invasive lobular carcinoma measuring 2.9 cm by MRI, with a positive prechemotherapy axillary lymph node biopsy, strongly estrogen and progesterone receptor positive with no evidence of HER-2/neu amplification and an MIB-1 of 14%.   (2) received 6 cycles of docetaxel/ cyclophosphamide in the neoadjuvant setting, completed in mid July  of 2011   (3) s/p left lumpectomy and axillary lymph node dissection in August of 2011 for a ypT1b yTN3 (14 positive lymph nodes), Stage IIIC, grade 1 residual tumor. HER-2/neu was repeated, again not amplified.   (4) Completed loco-regional radiation in late October of 2011   (5) on letrozole starting early November 2011, continued to October 2016, with progression  METASTATIC DISEASE: September 2016: Involving left adrenal gland, regional nodes and bones  (6) biopsy of a 7.5 cm left adrenal mass 05/30/2015  showed metastatic carcinoma, estrogen, progesterone, and HER-2 negative, with an MIB-1 of 90-100%; the tumor cells were cytokeratin 7 positive, cytokeratin 20 focally positive, gross cystic disease fluid protein negative  (a) the CA-27-29 is informative  (b) PET scan 06/17/2015 shows, in addition to the left adrenal mass, some periaortic adenopathy and multiple hypermetabolic bone metastases (L2, T9, T2 and others)  (c) EGD on 06/16/2015 to evaluate suspicious cardiac wall thickening showed no evidence of malignancy  (7) capecitabine started 07/04/2015 at 1.5 g BID 7/7. Discontinued 08/29/15 with progression  (8) denosumab/ Xgeva started 07/18/2015, repeated monthly  (9) eribulin D1/D8 q 21 days started 09/23/14. 1 complete cycle given before hospitalization. Discontinued due to elevation in LFTs.   (10) hospitalized for infuenza A on 09/26/15. Pulmonary embolus found. Lovenox started daily.  (11) hepatic encephalopathy during hospital stay in January 2017. Lactulose completed. Awaiting approval of rifaximin due to financial strain.   (12) carboplatin Q14 days to start 11/07/2015  OTHER PROBLEMS: (a) the patient is status post hemorrhagic CVA January 2016  (b) congenital absence of left kidney  PLAN:  Adlean's cough has largely cleared. She never had a fever. Whether the Cipro she got for the urinary tract infection which proved not to be a urinary tract infection helped or not, at this point I feel comfortable proceeding with chemotherapy.  It is wonderful to see that her mind is back. She was completely clear today, and so able to participate in treatment planning.  I don't know if it would be better to do two weeks in a row and 1 week off, or give carboplatin every 2 weeks, or try to be more intense (weekly). I have dropped the dose to make at least the first dose easier on her. She will take metoclopramide 3 times a day as needed for nausea at home.  Whether we will treat next week  or not we will decide next visit. She already has an appointment in place, with labs and visit and she is scheduled for treatment as well if we decide to proceed  Latrese knows to call for any problems that may develop before that visit. [name], MD 11/07/2015 6:38 PM  Medical Oncology and Hematology [facility] [address] [place], [place] [address] Tel. [phone]    Fax. [phone]

## 2015-11-07 NOTE — Patient Instructions (Signed)

## 2015-11-08 LAB — CANCER ANTIGEN 27-29 (PARALLEL TESTING): CA 27.29: 83 U/mL — ABNORMAL HIGH (ref <38)

## 2015-11-08 LAB — CANCER ANTIGEN 27.29: CA 27.29: 84.9 U/mL — ABNORMAL HIGH (ref 0.0–38.6)

## 2015-11-14 ENCOUNTER — Telehealth: Payer: Self-pay | Admitting: Nurse Practitioner

## 2015-11-14 ENCOUNTER — Ambulatory Visit (HOSPITAL_BASED_OUTPATIENT_CLINIC_OR_DEPARTMENT_OTHER): Payer: Medicare Other

## 2015-11-14 ENCOUNTER — Ambulatory Visit: Payer: Medicare Other

## 2015-11-14 ENCOUNTER — Ambulatory Visit (HOSPITAL_BASED_OUTPATIENT_CLINIC_OR_DEPARTMENT_OTHER): Payer: Medicare Other | Admitting: Nurse Practitioner

## 2015-11-14 ENCOUNTER — Other Ambulatory Visit (HOSPITAL_BASED_OUTPATIENT_CLINIC_OR_DEPARTMENT_OTHER): Payer: Medicare Other

## 2015-11-14 ENCOUNTER — Other Ambulatory Visit: Payer: Self-pay | Admitting: Oncology

## 2015-11-14 ENCOUNTER — Encounter: Payer: Self-pay | Admitting: Nurse Practitioner

## 2015-11-14 VITALS — BP 123/71 | HR 63 | Temp 97.6°F | Resp 18 | Wt 169.3 lb

## 2015-11-14 DIAGNOSIS — Z170 Estrogen receptor positive status [ER+]: Secondary | ICD-10-CM | POA: Diagnosis not present

## 2015-11-14 DIAGNOSIS — C773 Secondary and unspecified malignant neoplasm of axilla and upper limb lymph nodes: Secondary | ICD-10-CM

## 2015-11-14 DIAGNOSIS — Z5111 Encounter for antineoplastic chemotherapy: Secondary | ICD-10-CM | POA: Diagnosis not present

## 2015-11-14 DIAGNOSIS — C7951 Secondary malignant neoplasm of bone: Secondary | ICD-10-CM

## 2015-11-14 DIAGNOSIS — I2699 Other pulmonary embolism without acute cor pulmonale: Secondary | ICD-10-CM

## 2015-11-14 DIAGNOSIS — C7972 Secondary malignant neoplasm of left adrenal gland: Secondary | ICD-10-CM | POA: Diagnosis not present

## 2015-11-14 DIAGNOSIS — C50912 Malignant neoplasm of unspecified site of left female breast: Secondary | ICD-10-CM

## 2015-11-14 DIAGNOSIS — C50412 Malignant neoplasm of upper-outer quadrant of left female breast: Secondary | ICD-10-CM | POA: Diagnosis not present

## 2015-11-14 DIAGNOSIS — C50919 Malignant neoplasm of unspecified site of unspecified female breast: Secondary | ICD-10-CM

## 2015-11-14 LAB — CBC WITH DIFFERENTIAL/PLATELET
BASO%: 1.5 % (ref 0.0–2.0)
Basophils Absolute: 0.1 10*3/uL (ref 0.0–0.1)
EOS%: 7.7 % — AB (ref 0.0–7.0)
Eosinophils Absolute: 0.4 10*3/uL (ref 0.0–0.5)
HCT: 37.9 % (ref 34.8–46.6)
HEMOGLOBIN: 12.6 g/dL (ref 11.6–15.9)
LYMPH%: 26.6 % (ref 14.0–49.7)
MCH: 35.9 pg — ABNORMAL HIGH (ref 25.1–34.0)
MCHC: 33.2 g/dL (ref 31.5–36.0)
MCV: 108 fL — ABNORMAL HIGH (ref 79.5–101.0)
MONO#: 0.6 10*3/uL (ref 0.1–0.9)
MONO%: 13.2 % (ref 0.0–14.0)
NEUT%: 51 % (ref 38.4–76.8)
NEUTROS ABS: 2.4 10*3/uL (ref 1.5–6.5)
Platelets: 192 10*3/uL (ref 145–400)
RBC: 3.51 10*6/uL — AB (ref 3.70–5.45)
RDW: 14.6 % — AB (ref 11.2–14.5)
WBC: 4.7 10*3/uL (ref 3.9–10.3)
lymph#: 1.3 10*3/uL (ref 0.9–3.3)

## 2015-11-14 LAB — COMPREHENSIVE METABOLIC PANEL
ALBUMIN: 2.8 g/dL — AB (ref 3.5–5.0)
ALK PHOS: 435 U/L — AB (ref 40–150)
ALT: 56 U/L — ABNORMAL HIGH (ref 0–55)
AST: 101 U/L — AB (ref 5–34)
Anion Gap: 10 mEq/L (ref 3–11)
BILIRUBIN TOTAL: 1.6 mg/dL — AB (ref 0.20–1.20)
BUN: 19.6 mg/dL (ref 7.0–26.0)
CO2: 23 mEq/L (ref 22–29)
Calcium: 8.5 mg/dL (ref 8.4–10.4)
Chloride: 104 mEq/L (ref 98–109)
Creatinine: 0.8 mg/dL (ref 0.6–1.1)
EGFR: 82 mL/min/{1.73_m2} — AB (ref >90)
GLUCOSE: 123 mg/dL (ref 70–140)
Potassium: 4 mEq/L (ref 3.5–5.1)
SODIUM: 137 meq/L (ref 136–145)
TOTAL PROTEIN: 6.7 g/dL (ref 6.4–8.3)

## 2015-11-14 MED ORDER — SODIUM CHLORIDE 0.9 % IV SOLN
138.0000 mg | Freq: Once | INTRAVENOUS | Status: AC
  Administered 2015-11-14: 140 mg via INTRAVENOUS
  Filled 2015-11-14: qty 14

## 2015-11-14 MED ORDER — PALONOSETRON HCL INJECTION 0.25 MG/5ML
0.2500 mg | Freq: Once | INTRAVENOUS | Status: AC
  Administered 2015-11-14: 0.25 mg via INTRAVENOUS

## 2015-11-14 MED ORDER — SODIUM CHLORIDE 0.9% FLUSH
10.0000 mL | INTRAVENOUS | Status: DC | PRN
  Administered 2015-11-14: 10 mL via INTRAVENOUS
  Filled 2015-11-14: qty 10

## 2015-11-14 MED ORDER — HEPARIN SOD (PORK) LOCK FLUSH 100 UNIT/ML IV SOLN
500.0000 [IU] | Freq: Once | INTRAVENOUS | Status: AC | PRN
  Administered 2015-11-14: 500 [IU]
  Filled 2015-11-14: qty 5

## 2015-11-14 MED ORDER — SODIUM CHLORIDE 0.9 % IV SOLN
10.0000 mg | Freq: Once | INTRAVENOUS | Status: AC
  Administered 2015-11-14: 10 mg via INTRAVENOUS
  Filled 2015-11-14: qty 1

## 2015-11-14 MED ORDER — DENOSUMAB 120 MG/1.7ML ~~LOC~~ SOLN
120.0000 mg | Freq: Once | SUBCUTANEOUS | Status: AC
  Administered 2015-11-14: 120 mg via SUBCUTANEOUS
  Filled 2015-11-14: qty 1.7

## 2015-11-14 MED ORDER — SODIUM CHLORIDE 0.9% FLUSH
10.0000 mL | INTRAVENOUS | Status: DC | PRN
  Administered 2015-11-14: 10 mL
  Filled 2015-11-14: qty 10

## 2015-11-14 MED ORDER — SODIUM CHLORIDE 0.9 % IV SOLN
Freq: Once | INTRAVENOUS | Status: AC
  Administered 2015-11-14: 12:00:00 via INTRAVENOUS

## 2015-11-14 MED ORDER — PALONOSETRON HCL INJECTION 0.25 MG/5ML
INTRAVENOUS | Status: AC
  Filled 2015-11-14: qty 5

## 2015-11-14 NOTE — Telephone Encounter (Signed)
appt made and avs printed °

## 2015-11-14 NOTE — Progress Notes (Signed)
. Maria Pruitt   DOB: 04/08/1946  MR#: [id number]  KMQ#:[id number]  Maria Nova, PA-C GYN:  SU:  OTHER MD:  CHIEF COMPLAINT: left adrenal metastatsis  CURRENT TREATMENT: denosumab, carboplatin  BREAST CANCER HISTORY:  from the original intake note:  Maria Pruitt herself palpated a change in her left breast, shortly before her mammogram was due.  She brought this to Maria Pruitt attention and he set her up for diagnostic mammography on October 31, 2009.   Routine and implant displaced views of both breasts showed a 2 cm irregular mass in the outer left breast, seen only on the spot tangential view.  The breast tissue itself was fatty.  There were no suspicious calcifications.  This mass was palpable to Maria Pruitt.  An ultrasound showed it to be 1.7 cm irregular and hypoechoic.  There were at least two enlarged left axillary lymph nodes identified.  Maria Pruitt recommended biopsy which was performed the same day and showed ([id number]) and invasive breast cancer felt most likely to be ductal with lobular features.  The left axillary lymph node biopsy also showed the same tumor (similar morphologic findings) and both prognostic profiles showed the cancer to be strongly ER and PR positive with a low to borderline proliferation fraction (13 and 14%).  Neither mass showed amplification of Her-2 by CISH.  (The ratios were 1.1 and 0.85.)    With this information, the patient was referred to Maria Pruitt and bilateral breast MRIs were obtained March 1st.  This showed several left axillary lymph nodes with cortical thickening, the largest one measuring 1.1 cm.  The mass itself measured 2.9 cm by MRI.  It was spiculated and irregular.  There was no evidence of contralateral disease.   She was treated neoadjuvantly with docetaxel and cyclophosphamide for 6 cycles completed in July 2011, after which she underwent left lumpectomy and axillary dissection August 2011 for a ypT1b yTN3 (14 positive lymph nodes),  Stage IIIC, grade 1 residual tumor. HER-2/neu was repeated, again not amplified. After completing local regional radiation October 2011 she started letrozole November 2011, continued to September 2016, which she was found to have metastatic disease   METASTATIC DISEASE HISTORY: From the 06/08/2015 summary:   Maria Pruitt returns today for a new problem accompanied by her sister Maria Pruitt. To summarize the recent history: She had a hemorrhagic stroke in January 2016 with some residuals. She has been living next door to her son in the [place] area since then.--In August she presented to her primary care physician, Maria Pruitt, with a complaint of abdominal discomfort, nausea and vomiting. He treated her for a UTI w/o resolution and obtained a KUB in his office; this was nondiagnostic. Accordingly he set up the patient for CT scan of the abdomen and pelvis 05/06/2015. This showed the left adrenal gland to be enlarged to 7.5 x 4.0 cm. There was eccentric wall thickening of the gastric cardia. There were small lymph nodes in the upper abdomen and retroperitoneum. There was no liver involvement. The right adrenal gland was unremarkable. There was severe left kidney atrophy (congenital).  The patient was then set up for biopsy of the left adrenal mass on 05/30/2015. This showed Maria Pruitt [id number]) metastatic carcinoma which was cytokeratin 7 positive, with rare cytokeratin 20 positivity and was negative for gross cystic disease fluid protein. The prognostic panel showed this tumor to be estrogen and progesterone receptor negative, HER-2 negative, with an MIB-1 of 90-100%.   PET scan10/03/2015 which showed in additional to the  left adrenal mass, some regional lymph nodes and multiple bone lesions. There was no obvious primary tumor. She also had an EGD to evaluate a possible gastric primary suggested by the yearly a CT scan of the chest. This was normal. We also obtained tumor markerswhich showed a normal CA 19,a mildly  elevated CEA at 9.7,Maria Pruitt is significantly elevated CA-27-29 at 125. Given this data, the most likely interpretation is that we are dealing with an estrogen receptor negative recurrence of her earlier estrogen receptor positive breast cancer, now stage IV  Her subsequent history is as detailed below  INTERVAL HISTORY: Maria Pruitt returns today for follow-up of her metastatic breast cancer, accompanied by her son Maria Pruitt. Today is day 8, cycle 1 of carboplatin. We decided today that she will receive a day 8 treatment today and with further cycles, with the 3rd week off every 21 days.   REVIEW OF SYSTEMS: [name] had a successful first treatment. She denies fevers or chills. She had no nausea taking the reglan as directed. She is moving her bowels every day. Her appetite is excellent, perhaps secondary to the steroids. She denies mouth sores or rashes. For a day or two after treatment she was fatigued and a little weak, but she stayed well hydrated. She had so mild dizziness. This has resolved. She is no longer coughing. She is sleeping well. She continues on lovenox with no abnormal bruising or bleeding. She would like to come off of the injections as soon as possible however, because they certainly are not comfortable.A detailed review of systems is otherwise stable.  PAST MEDICAL HISTORY: Past Medical History  Diagnosis Date  . Hypertension   . Breast cancer ([place]) 2011    Stage 3  . Atrial fibrillation ([place])   . Hypercholesterolemia   . Abnormal ECG   . Allergy     seasonal  . Arthritis   . GERD (gastroesophageal reflux disease)   . 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 Seizures ([place])   . Stroke ([place])     dec 15  . Thyroid disease   . Adrenal cancer ([place])    1. Significant for pituitary adenoma which was removed in 1983 but recurred, requiring radiation and briefly some Parlodel.  She has been off treatment since 1985.  She has some acromegaly secondary to this tumor.    2. She is status post bilateral submuscular breast implants under Dr. Towanda Malkin in 1987.   3. She is status post C-section for her third child who was hydrocephalic.   4. She underwent rhinoplasty in 1984.   5. She has a history of hypertension. 6. History of mitral valve prolapse, but she says she has not been receiving antibiotics prior to dental or similar procedures.   7. History of hypothyroidism.   8. History of congenital absence of one kidney.   9. History of hyperlipidemia.  10. History of obesity.   11. History of renal stones.   12. History of fatty liver.   13. History of gout.   14. History of palpitations.   History of childhood asthma  PAST SURGICAL HISTORY: Past Surgical History  Procedure Laterality Date  . Cesarean section  1980  . Breast enhancement surgery  1987  . Umbilical hernia repair    . Tubal ligation    . Scar revision    . Pituitary surgery      adenoma  . Rhinoplasty  1985  . Breast lumpectomy Left   .  Colonoscopy      FAMILY HISTORY Family History  Problem Relation Age of Onset  . Heart disease Mother   . Heart attack Father   . Heart attack Brother   . Colon cancer Neg Hx    The patient's father died from a myocardial infarction at the age of 44.  The patient's mother died from complications of congestive heart failure at the age of 46.  The patient had one brother who died from a myocardial infarction at the age of 61.  One of the brothers and two sisters are alive and well.  Sr. Maria Pruitt is an ICU nurse. There is no history of breast or ovarian cancer in the family to her knowledge.  GYNECOLOGIC HISTORY: She is GX P3, first pregnancy to term at age 89.  She went through the change of life at the time of her pituitary surgery in 1983.  She took hormone replacement for less than a year because of poor tolerance.    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  She  gave up her job in August 23, 2010.  Her first husband died from chronic myeloid leukemia in 1985.  Her three children from that marriage include her son Randall Hiss who died from complications of hydrocephaly at age 32; son [name] who is a Insurance underwriter and son Maria Pruitt who is an Public house manager in this area.  The patient has five grandchildren.  Her second husband of 20+ years, Ron, is a Company secretary of an independent [employer] and also does Writer and is a Oceanographer. He is now disabled due toa traumatic brain injury. He lost his first wife to endometrial cancer.  He has a daughter from that marriage.  She lives in [place]: not in place  HEALTH MAINTENANCE: Social History  Substance Use Topics  . Smoking status: Never Smoker   . Smokeless tobacco: Never Used  . Alcohol Use: No     Colonoscopy:  PAP:  Bone density: January 2012/ osteopenia  Lipid panel:  Allergies  Allergen Reactions  . Ciprofloxacin Other (See Comments)  . Clonidine Derivatives Other (See Comments)  . Phenytoin     Other reaction(s): Other Elevated LFTs  . Sulfa Antibiotics Hives  . Benzonatate     REACTION: Swelling, tessalon perles; tolerates benadryl  . Codeine     REACTION: Nausea    Current Outpatient Prescriptions  Medication Sig Dispense Refill  . Ascorbic Acid (VITAMIN C) 100 MG tablet Take 1,000 mg by mouth daily.     Marland Kitchen aspirin (ASPIRIN CHILDRENS) 81 MG chewable tablet Chew 1 tablet (81 mg total) by mouth daily. 100 tablet 4  . calcium carbonate (TUMS - DOSED IN MG ELEMENTAL CALCIUM) 500 MG chewable tablet Chew 1 tablet (200 mg of elemental calcium total) by mouth 3 (three) times daily. 30 tablet 0  . enoxaparin (LOVENOX) 40 MG/0.4ML injection Inject 0.4 mLs (40 mg total) into the skin daily. 30 Syringe 1  . famotidine (PEPCID) 20 MG tablet Take 1 tablet (20 mg total) by mouth 2 (two) times daily. 180 tablet 4  . hydrochlorothiazide (HYDRODIURIL) 25 MG tablet Take 1 tablet (25 mg total) by  mouth daily.    Marland Kitchen levothyroxine (SYNTHROID, LEVOTHROID) 100 MCG tablet Take 88 mcg by mouth daily before breakfast.    . loratadine (CLARITIN) 10 MG tablet Take 10 mg by mouth daily.    Marland Kitchen losartan (COZAAR) 50 MG  tablet Take 50 mg by mouth daily.    . metoCLOPramide (REGLAN) 5 MG tablet Take 1 tablet (5 mg total) by mouth 3 (three) times daily as needed for nausea. 30 tablet 1  . metoprolol (LOPRESSOR) 50 MG tablet Take 50 mg by mouth 2 (two) times daily.    . Multiple Vitamin (MULTIVITAMIN WITH MINERALS) TABS tablet Take 1 tablet by mouth daily. 30 tablet 0  . lactulose (CHRONULAC) 10 GM/15ML solution Take 45 mLs (30 g total) by mouth 2 (two) times daily. (Patient not taking: Reported on 11/14/2015) 240 mL 1  . oxyCODONE (OXY IR/ROXICODONE) 5 MG immediate release tablet Take 1 tablet (5 mg total) by mouth every 4 (four) hours as needed for severe pain. (Patient not taking: Reported on 10/24/2015) 30 tablet 0   No current facility-administered medications for this visit.   Facility-Administered Medications Ordered in Other Visits  Medication Dose Route Frequency Provider Last Rate Last Dose  . 0.9 %  sodium chloride infusion   Intravenous Once Maria Cruel, MD        OBJECTIVE: Middle-aged white woman in no acute distress Filed Vitals:   11/14/15 1047  BP: 123/71  Pulse: 63  Temp: 97.6 F (36.4 C)  Resp: 18     Body mass index is 29.05 kg/(m^2).    ECOG FS: 1  Skin: warm, dry  HEENT: sclerae anicteric, conjunctivae pink, oropharynx clear. No thrush or mucositis.  Lymph Nodes: No cervical or supraclavicular lymphadenopathy  Lungs: clear to auscultation bilaterally, no rales, wheezes, or rhonci  Heart: regular rate and rhythm  Abdomen: round, soft, non tender, positive bowel sounds  Musculoskeletal: No focal spinal tenderness, no peripheral edema  Neuro: non focal, well oriented, positive affect  Breasts: deferred  LAB RESULTS: CBC Latest Ref Rng 11/14/2015 11/07/2015 10/31/2015   WBC 3.9 - 10.3 10e3/uL 4.7 4.4 4.4  Hemoglobin 11.6 - 15.9 g/dL 12.6 11.8 12.0  Hematocrit 34.8 - 46.6 % 37.9 35.4 36.3  Platelets 145 - 400 10e3/uL 192 162 170     CMP Latest Ref Rng 11/14/2015 11/07/2015 10/31/2015  Glucose 70 - 140 mg/dl 123 91 133  BUN 7.0 - 26.0 mg/dL 19.6 10.1 11.0  Creatinine 0.6 - 1.1 mg/dL 0.8 0.7 0.7  Sodium 136 - 145 mEq/L 137 133(L) 135(L)  Potassium 3.5 - 5.1 mEq/L 4.0 4.2 3.7  CO2 22 - 29 mEq/L 23 24 25   Calcium 8.4 - 10.4 mg/dL 8.5 8.2(L) 8.5  Total Protein 6.4 - 8.3 g/dL 6.7 6.3(L) 6.5  Total Bilirubin 0.20 - 1.20 mg/dL 1.60(H) 1.81(H) 2.09(H)  Alkaline Phos 40 - 150 U/L 435(H) 487(H) 548(H)  AST 5 - 34 U/L 101(H) 106(H) 101(H)  ALT 0 - 55 U/L 56(H) 49 47    STUDIES: No results found.  ASSESSMENT: 70 y.o. [place], [place] woman  (1) status post left breast biopsy in February of 2011 for an invasive lobular carcinoma measuring 2.9 cm by MRI, with a positive prechemotherapy axillary lymph node biopsy, strongly estrogen and progesterone receptor positive with no evidence of HER-2/neu amplification and an MIB-1 of 14%.   (2) received 6 cycles of docetaxel/ cyclophosphamide in the neoadjuvant setting, completed in mid July of 2011   (3) s/p left lumpectomy and axillary lymph node dissection in August of 2011 for a ypT1b yTN3 (14 positive lymph nodes), Stage IIIC, grade 1 residual tumor. HER-2/neu was repeated, again not amplified.   (4) Completed loco-regional radiation in late October of 2011   (5) on letrozole  starting early November 2011, continued to October 2016, with progression  METASTATIC DISEASE: September 2016: Involving left adrenal gland, regional nodes and bones  (6) biopsy of a 7.5 cm left adrenal mass 05/30/2015 showed metastatic carcinoma, estrogen, progesterone, and HER-2 negative, with an MIB-1 of 90-100%; the tumor cells were cytokeratin 7 positive, cytokeratin 20 focally positive, gross cystic disease fluid protein  negative  (a) the CA-27-29 is informative  (b) PET scan 06/17/2015 shows, in addition to the left adrenal mass, some periaortic adenopathy and multiple hypermetabolic bone metastases (L2, T9, T2 and others)  (c) EGD on 06/16/2015 to evaluate suspicious cardiac wall thickening showed no evidence of malignancy  (7) capecitabine started 07/04/2015 at 1.5 g BID 7/7. Discontinued 08/29/15 with progression  (8) denosumab/ Xgeva started 07/18/2015, repeated monthly  (9) eribulin D1/D8 q 21 days started 09/23/14. 1 complete cycle given before hospitalization. Discontinued due to elevation in LFTs.   (10) hospitalized for infuenza A on 09/26/15. Pulmonary embolus found. Lovenox started daily.  (11) hepatic encephalopathy during hospital stay in January 2017. Lactulose completed. Awaiting approval of rifaximin due to financial strain.   (12) carboplatin d1/d8 every 21 days started 11/07/2015  OTHER PROBLEMS: (a) the patient is status post hemorrhagic CVA January 2016  (b) congenital absence of left kidney  PLAN:  Dashana managed her first cycle of carboplatin well. The labs were reviewed in detail and were stable. For these reasons we will proceed with the next treatment today, proceeding in a day 1/day 8 fashion every 21 days. She will have next week off.   We discussed use of lovenox. Dr. Jana Hakim suggests she continues the injections for now. After the completion of 3 cycles of this current treatment (9 weeks in total), we will have a restaging CT scan performed. If the pulmonary embolism has resolved, we will discontinue the lovenox.   Damya will return in 2 weeks for the start of cycle 2. She understands and agrees with this plan. She knows the goal of treatment in her case is control. She has been encouraged to call with any issues that might arise before her next visit here.   Laurie Panda, NP 11/14/2015 11:48 AM

## 2015-11-14 NOTE — Patient Instructions (Signed)
[facility] Discharge Instructions for Patients Receiving Chemotherapy  Today you received the following chemotherapy agents: Carboplatin.  To help prevent nausea and vomiting after your treatment, we encourage you to take your nausea medication: Zofran 8 mg every 8 hours as needed.   If you develop nausea and vomiting that is not controlled by your nausea medication, call the clinic.   BELOW ARE SYMPTOMS THAT SHOULD BE REPORTED IMMEDIATELY:  *FEVER GREATER THAN 100.5 F  *CHILLS WITH OR WITHOUT FEVER  NAUSEA AND VOMITING THAT IS NOT CONTROLLED WITH YOUR NAUSEA MEDICATION  *UNUSUAL SHORTNESS OF BREATH  *UNUSUAL BRUISING OR BLEEDING  TENDERNESS IN MOUTH AND THROAT WITH OR WITHOUT PRESENCE OF ULCERS  *URINARY PROBLEMS  *BOWEL PROBLEMS  UNUSUAL RASH Items with * indicate a potential emergency and should be followed up as soon as possible.  Feel free to call the clinic you have any questions or concerns. The clinic phone number is [phone].  Please show the [facility] at check-in to the Emergency Department and triage nurse.  Carboplatin injection What is this medicine? CARBOPLATIN (KAR boe pla tin) is a chemotherapy drug. It targets fast dividing cells, like cancer cells, and causes these cells to die. This medicine is used to treat ovarian cancer and many other cancers. This medicine may be used for other purposes; ask your health care provider or pharmacist if you have questions. What should I tell my health care provider before I take this medicine? They need to know if you have any of these conditions: -blood disorders -hearing problems -kidney disease -recent or ongoing radiation therapy -an unusual or allergic reaction to carboplatin, cisplatin, other chemotherapy, other medicines, foods, dyes, or preservatives -pregnant or trying to get pregnant -breast-feeding How should I use this medicine? This drug is usually given as an infusion into a  vein. It is administered in a hospital or clinic by a specially trained health care professional. Talk to your pediatrician regarding the use of this medicine in children. Special care may be needed. Overdosage: If you think you have taken too much of this medicine contact a poison control center or emergency room at once. NOTE: This medicine is only for you. Do not share this medicine with others. What if I miss a dose? It is important not to miss a dose. Call your doctor or health care professional if you are unable to keep an appointment. What may interact with this medicine? -medicines for seizures -medicines to increase blood counts like filgrastim, pegfilgrastim, sargramostim -some antibiotics like amikacin, gentamicin, neomycin, streptomycin, tobramycin -vaccines Talk to your doctor or health care professional before taking any of these medicines: -acetaminophen -aspirin -ibuprofen -ketoprofen -naproxen This list may not describe all possible interactions. Give your health care provider a list of all the medicines, herbs, non-prescription drugs, or dietary supplements you use. Also tell them if you smoke, drink alcohol, or use illegal drugs. Some items may interact with your medicine. What should I watch for while using this medicine? Your condition will be monitored carefully while you are receiving this medicine. You will need important blood work done while you are taking this medicine. This drug may make you feel generally unwell. This is not uncommon, as chemotherapy can affect healthy cells as well as cancer cells. Report any side effects. Continue your course of treatment even though you feel ill unless your doctor tells you to stop. In some cases, you may be given additional medicines to help with side effects. Follow  all directions for their use. Call your doctor or health care professional for advice if you get a fever, chills or sore throat, or other symptoms of a cold or flu.  Do not treat yourself. This drug decreases your body's ability to fight infections. Try to avoid being around people who are sick. This medicine may increase your risk to bruise or bleed. Call your doctor or health care professional if you notice any unusual bleeding. Be careful brushing and flossing your teeth or using a toothpick because you may get an infection or bleed more easily. If you have any dental work done, tell your dentist you are receiving this medicine. Avoid taking products that contain aspirin, acetaminophen, ibuprofen, naproxen, or ketoprofen unless instructed by your doctor. These medicines may hide a fever. Do not become pregnant while taking this medicine. Women should inform their doctor if they wish to become pregnant or think they might be pregnant. There is a potential for serious side effects to an unborn child. Talk to your health care professional or pharmacist for more information. Do not breast-feed an infant while taking this medicine. What side effects may I notice from receiving this medicine? Side effects that you should report to your doctor or health care professional as soon as possible: -allergic reactions like skin rash, itching or hives, swelling of the face, lips, or tongue -signs of infection - fever or chills, cough, sore throat, pain or difficulty passing urine -signs of decreased platelets or bleeding - bruising, pinpoint red spots on the skin, black, tarry stools, nosebleeds -signs of decreased red blood cells - unusually weak or tired, fainting spells, lightheadedness -breathing problems -changes in hearing -changes in vision -chest pain -high blood pressure -low blood counts - This drug may decrease the number of white blood cells, red blood cells and platelets. You may be at increased risk for infections and bleeding. -nausea and vomiting -pain, swelling, redness or irritation at the injection site -pain, tingling, numbness in the hands or  feet -problems with balance, talking, walking -trouble passing urine or change in the amount of urine Side effects that usually do not require medical attention (report to your doctor or health care professional if they continue or are bothersome): -hair loss -loss of appetite -metallic taste in the mouth or changes in taste This list may not describe all possible side effects. Call your doctor for medical advice about side effects. You may report side effects to FDA at 1-800-FDA-1088. Where should I keep my medicine? This drug is given in a hospital or clinic and will not be stored at home. NOTE: This sheet is a summary. It may not cover all possible information. If you have questions about this medicine, talk to your doctor, pharmacist, or health care provider.    2016, Elsevier/Gold Standard. (2007-12-02 14:38:05)

## 2015-11-14 NOTE — Progress Notes (Signed)
Dr. Jana Hakim reviewed pt labs today. Ok to treat with chemo.

## 2015-11-21 ENCOUNTER — Other Ambulatory Visit: Payer: Medicare Other

## 2015-11-21 ENCOUNTER — Ambulatory Visit: Payer: Medicare Other | Admitting: Nurse Practitioner

## 2015-11-21 ENCOUNTER — Telehealth: Payer: Self-pay | Admitting: *Deleted

## 2015-11-21 ENCOUNTER — Ambulatory Visit: Payer: Medicare Other

## 2015-11-21 NOTE — Telephone Encounter (Signed)
Called pt to follow up. She was out running errands but I talked to her son and he said she had a great week and is doing fine. She will have see [place] on 11/28/15. Message to be fwd to H.Boelter,NP.

## 2015-11-24 ENCOUNTER — Encounter: Payer: Self-pay | Admitting: Internal Medicine

## 2015-11-24 ENCOUNTER — Ambulatory Visit (INDEPENDENT_AMBULATORY_CARE_PROVIDER_SITE_OTHER): Payer: Medicare Other | Admitting: Internal Medicine

## 2015-11-24 VITALS — BP 110/78 | HR 73 | Ht 62.5 in | Wt 170.8 lb

## 2015-11-24 DIAGNOSIS — K7460 Unspecified cirrhosis of liver: Secondary | ICD-10-CM

## 2015-11-24 DIAGNOSIS — K7581 Nonalcoholic steatohepatitis (NASH): Secondary | ICD-10-CM

## 2015-11-24 NOTE — Progress Notes (Signed)
   Subjective:    Patient ID: Maria Pruitt, female    DOB: 03-28-1946, 70 y.o.   MRN: [id number] Chief complaint: Follow-up abnormal liver tests, cirrhosis HPI Asian is here with her daughter. She has seen some of the physician assistant's and I had performed an endoscopy on her in the past. She has cirrhosis which we think is most likely due to fatty liver. She got sacrum was in the hospital was confused and was encephalopathic. She was prescribed lactulose was helped but she was having terrible diarrhea with that and she has really stopped that and has no confusion or sleepiness. She says she feels great. She is on a new chemotherapeutic regimen for her metastatic breast cancer. Cannot wait to get to the [place] soon.  Medications, allergies, past medical history, past surgical history, family history and social history are reviewed and updated in the EMR.  Review of Systems As above    Objective:   Physical Exam @BP  D34-534 mmHg  Pulse 73  Ht 5' 2.5" (1.588 m)  Wt 170 lb 12.8 oz (77.474 kg)  BMI 30.72 kg/m2@  General:  NAD Eyes:   anicteric Lungs:  clear Heart:: S1S2 no rubs, murmurs or gallops Abdomen:  soft and nontender, BS+ Ext:   no edema, cyanosis or clubbing No asterixis a and o x 3 Data Reviewed:    Office visits late 2016 or early 2017 in GI, hospitalization or early 2017 imaging labs all in the EMR    Assessment & Plan:   1. Liver cirrhosis secondary to NASH (nonalcoholic steatohepatitis)    She is doing well at this time I don't see any evidence of encephalopathy on exam. In February she had an elevated ammonia but right now she looks great and centering her illnesses so no studies. She will see me back in 4 months sooner if needed.  CC: [name], PA-C Dr. Jana Hakim

## 2015-11-24 NOTE — Patient Instructions (Signed)
   Please follow up with Dr Carlean Purl in 4 months.      I appreciate the opportunity to care for you. Silvano Rusk, MD, [facility]

## 2015-11-25 ENCOUNTER — Encounter: Payer: Self-pay | Admitting: Internal Medicine

## 2015-11-28 ENCOUNTER — Ambulatory Visit (HOSPITAL_BASED_OUTPATIENT_CLINIC_OR_DEPARTMENT_OTHER): Payer: Medicare Other

## 2015-11-28 ENCOUNTER — Telehealth: Payer: Self-pay | Admitting: Nurse Practitioner

## 2015-11-28 ENCOUNTER — Other Ambulatory Visit (HOSPITAL_BASED_OUTPATIENT_CLINIC_OR_DEPARTMENT_OTHER): Payer: Medicare Other

## 2015-11-28 ENCOUNTER — Ambulatory Visit: Payer: Medicare Other

## 2015-11-28 ENCOUNTER — Ambulatory Visit (HOSPITAL_BASED_OUTPATIENT_CLINIC_OR_DEPARTMENT_OTHER): Payer: Medicare Other | Admitting: Nurse Practitioner

## 2015-11-28 ENCOUNTER — Other Ambulatory Visit: Payer: Self-pay | Admitting: *Deleted

## 2015-11-28 ENCOUNTER — Encounter: Payer: Self-pay | Admitting: Nurse Practitioner

## 2015-11-28 VITALS — BP 140/65 | HR 65 | Temp 97.6°F | Resp 17 | Ht 62.5 in | Wt 170.8 lb

## 2015-11-28 DIAGNOSIS — C7951 Secondary malignant neoplasm of bone: Secondary | ICD-10-CM | POA: Diagnosis not present

## 2015-11-28 DIAGNOSIS — C773 Secondary and unspecified malignant neoplasm of axilla and upper limb lymph nodes: Secondary | ICD-10-CM

## 2015-11-28 DIAGNOSIS — C50912 Malignant neoplasm of unspecified site of left female breast: Secondary | ICD-10-CM

## 2015-11-28 DIAGNOSIS — Z95828 Presence of other vascular implants and grafts: Secondary | ICD-10-CM

## 2015-11-28 DIAGNOSIS — C7972 Secondary malignant neoplasm of left adrenal gland: Secondary | ICD-10-CM

## 2015-11-28 DIAGNOSIS — Z5111 Encounter for antineoplastic chemotherapy: Secondary | ICD-10-CM

## 2015-11-28 DIAGNOSIS — I2609 Other pulmonary embolism with acute cor pulmonale: Secondary | ICD-10-CM

## 2015-11-28 DIAGNOSIS — C50412 Malignant neoplasm of upper-outer quadrant of left female breast: Secondary | ICD-10-CM

## 2015-11-28 LAB — COMPREHENSIVE METABOLIC PANEL
ALT: 46 U/L (ref 0–55)
AST: 80 U/L — ABNORMAL HIGH (ref 5–34)
Albumin: 2.8 g/dL — ABNORMAL LOW (ref 3.5–5.0)
Alkaline Phosphatase: 345 U/L — ABNORMAL HIGH (ref 40–150)
Anion Gap: 7 mEq/L (ref 3–11)
BILIRUBIN TOTAL: 1.02 mg/dL (ref 0.20–1.20)
BUN: 12.8 mg/dL (ref 7.0–26.0)
CHLORIDE: 104 meq/L (ref 98–109)
CO2: 27 meq/L (ref 22–29)
Calcium: 8.6 mg/dL (ref 8.4–10.4)
Creatinine: 0.7 mg/dL (ref 0.6–1.1)
EGFR: 90 mL/min/{1.73_m2} — AB (ref >90)
GLUCOSE: 116 mg/dL (ref 70–140)
POTASSIUM: 3.8 meq/L (ref 3.5–5.1)
SODIUM: 137 meq/L (ref 136–145)
TOTAL PROTEIN: 6.5 g/dL (ref 6.4–8.3)

## 2015-11-28 LAB — CBC WITH DIFFERENTIAL/PLATELET
BASO%: 1.3 % (ref 0.0–2.0)
Basophils Absolute: 0.1 10*3/uL (ref 0.0–0.1)
EOS%: 6.6 % (ref 0.0–7.0)
Eosinophils Absolute: 0.3 10*3/uL (ref 0.0–0.5)
HCT: 36.9 % (ref 34.8–46.6)
HGB: 12.7 g/dL (ref 11.6–15.9)
LYMPH#: 1 10*3/uL (ref 0.9–3.3)
LYMPH%: 25.6 % (ref 14.0–49.7)
MCH: 36.5 pg — ABNORMAL HIGH (ref 25.1–34.0)
MCHC: 34.4 g/dL (ref 31.5–36.0)
MCV: 106 fL — ABNORMAL HIGH (ref 79.5–101.0)
MONO#: 0.5 10*3/uL (ref 0.1–0.9)
MONO%: 13.3 % (ref 0.0–14.0)
NEUT%: 53.2 % (ref 38.4–76.8)
NEUTROS ABS: 2.1 10*3/uL (ref 1.5–6.5)
Platelets: 167 10*3/uL (ref 145–400)
RBC: 3.48 10*6/uL — AB (ref 3.70–5.45)
RDW: 13.8 % (ref 11.2–14.5)
WBC: 3.9 10*3/uL (ref 3.9–10.3)

## 2015-11-28 MED ORDER — SODIUM CHLORIDE 0.9 % IV SOLN
Freq: Once | INTRAVENOUS | Status: AC
  Administered 2015-11-28: 10:00:00 via INTRAVENOUS

## 2015-11-28 MED ORDER — SODIUM CHLORIDE 0.9 % IV SOLN
138.0000 mg | Freq: Once | INTRAVENOUS | Status: AC
  Administered 2015-11-28: 140 mg via INTRAVENOUS
  Filled 2015-11-28: qty 14

## 2015-11-28 MED ORDER — PALONOSETRON HCL INJECTION 0.25 MG/5ML
INTRAVENOUS | Status: AC
  Filled 2015-11-28: qty 5

## 2015-11-28 MED ORDER — SODIUM CHLORIDE 0.9 % IV SOLN
10.0000 mg | Freq: Once | INTRAVENOUS | Status: AC
  Administered 2015-11-28: 10 mg via INTRAVENOUS
  Filled 2015-11-28: qty 1

## 2015-11-28 MED ORDER — HEPARIN SOD (PORK) LOCK FLUSH 100 UNIT/ML IV SOLN
500.0000 [IU] | Freq: Once | INTRAVENOUS | Status: AC | PRN
  Administered 2015-11-28: 500 [IU]
  Filled 2015-11-28: qty 5

## 2015-11-28 MED ORDER — SODIUM CHLORIDE 0.9% FLUSH
10.0000 mL | INTRAVENOUS | Status: DC | PRN
  Administered 2015-11-28: 10 mL via INTRAVENOUS
  Filled 2015-11-28: qty 10

## 2015-11-28 MED ORDER — PALONOSETRON HCL INJECTION 0.25 MG/5ML
0.2500 mg | Freq: Once | INTRAVENOUS | Status: AC
  Administered 2015-11-28: 0.25 mg via INTRAVENOUS

## 2015-11-28 MED ORDER — ENOXAPARIN SODIUM 40 MG/0.4ML ~~LOC~~ SOLN: 40.0000 mg | SUBCUTANEOUS | Status: DC

## 2015-11-28 MED ORDER — SODIUM CHLORIDE 0.9% FLUSH
10.0000 mL | INTRAVENOUS | Status: DC | PRN
  Administered 2015-11-28: 10 mL
  Filled 2015-11-28: qty 10

## 2015-11-28 NOTE — Patient Instructions (Signed)
[facility] Discharge Instructions for Patients Receiving Chemotherapy  Today you received the following chemotherapy agents :  Carboplatin.  To help prevent nausea and vomiting after your treatment, we encourage you to take your nausea medication as prescribed.   If you develop nausea and vomiting that is not controlled by your nausea medication, call the clinic.   BELOW ARE SYMPTOMS THAT SHOULD BE REPORTED IMMEDIATELY:  *FEVER GREATER THAN 100.5 F  *CHILLS WITH OR WITHOUT FEVER  NAUSEA AND VOMITING THAT IS NOT CONTROLLED WITH YOUR NAUSEA MEDICATION  *UNUSUAL SHORTNESS OF BREATH  *UNUSUAL BRUISING OR BLEEDING  TENDERNESS IN MOUTH AND THROAT WITH OR WITHOUT PRESENCE OF ULCERS  *URINARY PROBLEMS  *BOWEL PROBLEMS  UNUSUAL RASH Items with * indicate a potential emergency and should be followed up as soon as possible.  Feel free to call the clinic you have any questions or concerns. The clinic phone number is [phone].  Please show the CHEMO ALERT CARD at check-in to the Emergency Department and triage nurse.   

## 2015-11-28 NOTE — Progress Notes (Signed)
. Maria Pruitt   DOB: [birth year]  MR#: [id number]  GQQ#:[id number]  Maria Nova, PA-C GYN:  SU:  OTHER MD:  CHIEF COMPLAINT: left adrenal metastatsis  CURRENT TREATMENT: denosumab, carboplatin  BREAST CANCER HISTORY:  from the original intake note:  Pleasant herself palpated a change in her left breast, shortly before her mammogram was due.  She brought this to Maria Pruitt attention and he set her up for diagnostic mammography on October 31, 2009.   Routine and implant displaced views of both breasts showed a 2 cm irregular mass in the outer left breast, seen only on the spot tangential view.  The breast tissue itself was fatty.  There were no suspicious calcifications.  This mass was palpable to Maria Pruitt.  An ultrasound showed it to be 1.7 cm irregular and hypoechoic.  There were at least two enlarged left axillary lymph nodes identified.  Maria Pruitt recommended biopsy which was performed the same day and showed ([id number]) and invasive breast cancer felt most likely to be ductal with lobular features.  The left axillary lymph node biopsy also showed the same tumor (similar morphologic findings) and both prognostic profiles showed the cancer to be strongly ER and PR positive with a low to borderline proliferation fraction (13 and 14%).  Neither mass showed amplification of Her-2 by CISH.  (The ratios were 1.1 and 0.85.)    With this information, the patient was referred to Dr. Margot Pruitt and bilateral breast MRIs were obtained March 1st.  This showed several left axillary lymph nodes with cortical thickening, the largest one measuring 1.1 cm.  The mass itself measured 2.9 cm by MRI.  It was spiculated and irregular.  There was no evidence of contralateral disease.   She was treated neoadjuvantly with docetaxel and cyclophosphamide for 6 cycles completed in July 2011, after which she underwent left lumpectomy and axillary dissection August 2011 for a ypT1b yTN3 (14 positive lymph nodes),  Stage IIIC, grade 1 residual tumor. HER-2/neu was repeated, again not amplified. After completing local regional radiation October 2011 she started letrozole November 2011, continued to September 2016, which she was found to have metastatic disease   METASTATIC DISEASE HISTORY: From the 06/08/2015 summary:   Maria Pruitt returns today for a new problem accompanied by her sister Maria Pruitt. To summarize the recent history: She had a hemorrhagic stroke in January 2016 with some residuals. She has been living next door to her son in the [place] area since then.--In August she presented to her primary care physician, Dr. Thornton Pruitt, with a complaint of abdominal discomfort, nausea and vomiting. He treated her for a UTI w/o resolution and obtained a KUB in his office; this was nondiagnostic. Accordingly he set up the patient for CT scan of the abdomen and pelvis 05/06/2015. This showed the left adrenal gland to be enlarged to 7.5 x 4.0 cm. There was eccentric wall thickening of the gastric cardia. There were small lymph nodes in the upper abdomen and retroperitoneum. There was no liver involvement. The right adrenal gland was unremarkable. There was severe left kidney atrophy (congenital).  The patient was then set up for biopsy of the left adrenal mass on 05/30/2015. This showed Maria Pruitt [id number]) metastatic carcinoma which was cytokeratin 7 positive, with rare cytokeratin 20 positivity and was negative for gross cystic disease fluid protein. The prognostic panel showed this tumor to be estrogen and progesterone receptor negative, HER-2 negative, with an MIB-1 of 90-100%.   PET scan10/03/2015 which showed in additional to the  left adrenal mass, some regional lymph nodes and multiple bone lesions. There was no obvious primary tumor. She also had an EGD to evaluate a possible gastric primary suggested by the yearly a CT scan of the chest. This was normal. We also obtained tumor markerswhich showed a normal CA 19,a mildly  elevated CEA at 9.7,Maria Pruitt is significantly elevated CA-27-29 at 125. Given this data, the most likely interpretation is that we are dealing with an estrogen receptor negative recurrence of her earlier estrogen receptor positive breast cancer, now stage IV  Her subsequent history is as detailed below  INTERVAL HISTORY: Maria Pruitt returns today for follow-up of her metastatic breast cancer, accompanied by her sister. Today is day 1, cycle 2 of carboplatin. In the interval history she had a follow up visit with  GI. There was no evidence of encephalopathy. She never got approved for xifaximin, but they believe she will do ok without this. She will follow up with them in 4 months.  REVIEW OF SYSTEMS: Maria Pruitt had good energy on her week off. She got lots of errands done. Her cognitive function is back to baseline. She continues on the lovenox injections, which she does not enjoy, but is completely compliant with. She did have very light blood staining on the toilet paper after she had a bowel movement over the weekend, but this resolved. She denies fevers, chills, nausea, vomiting, or changes in bowel or bladder habits. She has no pain. She is eating well.  A detailed review of systems is otherwise stable.  PAST MEDICAL HISTORY: Past Medical History  Diagnosis Date  . Hypertension   . Breast cancer ([place]) 2011    Stage 3  . Atrial fibrillation ([place])   . Hypercholesterolemia   . Abnormal ECG   . Allergy     seasonal  . Arthritis   . GERD (gastroesophageal reflux disease)   . 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 Seizures ([place])   . Stroke ([place])     dec 15  . Thyroid disease   . Adrenal cancer ([place])   . Fatty liver   . Influenza A    1. Significant for pituitary adenoma which was removed in 1983 but recurred, requiring radiation and briefly some Parlodel.  She has been off treatment since 1985.  She has some acromegaly secondary to this tumor.   2. She is status post  bilateral submuscular breast implants under Dr. Towanda Malkin in 1987.   3. She is status post C-section for her third child who was hydrocephalic.   4. She underwent rhinoplasty in 1984.   5. She has a history of hypertension. 6. History of mitral valve prolapse, but she says she has not been receiving antibiotics prior to dental or similar procedures.   7. History of hypothyroidism.   8. History of congenital absence of one kidney.   9. History of hyperlipidemia.  10. History of obesity.   11. History of renal stones.   12. History of fatty liver.   13. History of gout.   14. History of palpitations.   History of childhood asthma  PAST SURGICAL HISTORY: Past Surgical History  Procedure Laterality Date  . Cesarean section  1980  . Breast enhancement surgery  1987  . Umbilical hernia repair    . Tubal ligation    . Scar revision    . Pituitary surgery      adenoma  . Rhinoplasty  1985  . Breast lumpectomy Left   .  Colonoscopy      FAMILY HISTORY Family History  Problem Relation Age of Onset  . Heart disease Mother   . Heart attack Father   . Heart attack Brother   . Colon cancer Neg Hx    The patient's father died from a myocardial infarction at the age of 6.  The patient's mother died from complications of congestive heart failure at the age of 93.  The patient had one brother who died from a myocardial infarction at the age of 36.  One of the brothers and two sisters are alive and well.  Sr. Maria Pruitt is an ICU nurse. There is no history of breast or ovarian cancer in the family to her knowledge.  GYNECOLOGIC HISTORY: She is GX P3, first pregnancy to term at age 28.  She went through the change of life at the time of her pituitary surgery in 1983.  She took hormone replacement for less than a year because of poor tolerance.    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  She gave up her job in 08/31/2010.  Her first husband died from chronic myeloid leukemia in 1985.  Her three children from that marriage include her son Randall Hiss who died from complications of hydrocephaly at age 44; son [name] who is a Insurance underwriter and son [name] who is an Public house manager in this area.  The patient has five grandchildren.  Her second husband of 20+ years, Ron, is a Company secretary of an independent [employer] and also does Writer and is a Oceanographer. He is now disabled due toa traumatic brain injury. He lost his first wife to endometrial cancer.  He has a daughter from that marriage.  She lives in [place]: not in place  HEALTH MAINTENANCE: Social History  Substance Use Topics  . Smoking status: Never Smoker   . Smokeless tobacco: Never Used  . Alcohol Use: No     Colonoscopy:  PAP:  Bone density: January 2012/ osteopenia  Lipid panel:  Allergies  Allergen Reactions  . Ciprofloxacin Other (See Comments)  . Clonidine Derivatives Other (See Comments)  . Phenytoin     Other reaction(s): Other Elevated LFTs  . Sulfa Antibiotics Hives  . Benzonatate     REACTION: Swelling, tessalon perles; tolerates benadryl  . Codeine     REACTION: Nausea    Current Outpatient Prescriptions  Medication Sig Dispense Refill  . Ascorbic Acid (VITAMIN C) 100 MG tablet Take 1,000 mg by mouth daily.     Marland Kitchen aspirin (ASPIRIN CHILDRENS) 81 MG chewable tablet Chew 1 tablet (81 mg total) by mouth daily. 100 tablet 4  . calcium carbonate (TUMS - DOSED IN MG ELEMENTAL CALCIUM) 500 MG chewable tablet Chew 1 tablet (200 mg of elemental calcium total) by mouth 3 (three) times daily. 30 tablet 0  . famotidine (PEPCID) 20 MG tablet Take 1 tablet (20 mg total) by mouth 2 (two) times daily. 180 tablet 4  . hydrochlorothiazide (HYDRODIURIL) 25 MG tablet Take 1 tablet (25 mg total) by mouth daily.    Marland Kitchen levothyroxine (SYNTHROID, LEVOTHROID) 100 MCG tablet Take 88 mcg by mouth daily before breakfast.    . loratadine  (CLARITIN) 10 MG tablet Take 10 mg by mouth daily.    Marland Kitchen losartan (COZAAR) 50 MG tablet Take 50 mg by mouth daily.    . metoCLOPramide (REGLAN) 5 MG tablet Take 1 tablet (5  mg total) by mouth 3 (three) times daily as needed for nausea. 30 tablet 1  . metoprolol (LOPRESSOR) 50 MG tablet Take 50 mg by mouth 2 (two) times daily.    Marland Kitchen enoxaparin (LOVENOX) 40 MG/0.4ML injection Inject 0.4 mLs (40 mg total) into the skin daily. 30 Syringe 1  . lactulose (CHRONULAC) 10 GM/15ML solution Take 45 mLs (30 g total) by mouth 2 (two) times daily. (Patient not taking: Reported on 11/28/2015) 240 mL 1  . oxyCODONE (OXY IR/ROXICODONE) 5 MG immediate release tablet Take 1 tablet (5 mg total) by mouth every 4 (four) hours as needed for severe pain. (Patient not taking: Reported on 11/28/2015) 30 tablet 0   No current facility-administered medications for this visit.   Facility-Administered Medications Ordered in Other Visits  Medication Dose Route Frequency Provider Last Rate Last Dose  . 0.9 %  sodium chloride infusion   Intravenous Once Maria Cruel, MD      . CARBOplatin (PARAPLATIN) 140 mg in sodium chloride 0.9 % 100 mL chemo infusion  140 mg Intravenous Once Maria Cruel, MD      . dexamethasone (DECADRON) 10 mg in sodium chloride 0.9 % 50 mL IVPB  10 mg Intravenous Once Maria Cruel, MD   10 mg at 11/28/15 0956  . heparin lock flush 100 unit/mL  500 Units Intracatheter Once PRN Maria Cruel, MD      . sodium chloride flush (NS) 0.9 % injection 10 mL  10 mL Intracatheter PRN Maria Cruel, MD        OBJECTIVE: Middle-aged white woman in no acute distress Filed Vitals:   11/28/15 0853  BP: 140/65  Pulse: 65  Temp: 97.6 F (36.4 C)  Resp: 17     Body mass index is 30.72 kg/(m^2).    ECOG FS: 1  Sclerae unicteric, pupils round and equal Oropharynx clear and moist-- no thrush or other lesions No cervical or supraclavicular adenopathy Lungs no rales or rhonchi Heart regular rate  and rhythm Abd soft, nontender, positive bowel sounds MSK no focal spinal tenderness, no upper extremity lymphedema Neuro: nonfocal, well oriented, appropriate affect Breasts: deferred  LAB RESULTS: CBC Latest Ref Rng 11/28/2015 11/14/2015 11/07/2015  WBC 3.9 - 10.3 10e3/uL 3.9 4.7 4.4  Hemoglobin 11.6 - 15.9 g/dL 12.7 12.6 11.8  Hematocrit 34.8 - 46.6 % 36.9 37.9 35.4  Platelets 145 - 400 10e3/uL 167 192 162     CMP Latest Ref Rng 11/28/2015 11/14/2015 11/07/2015  Glucose 70 - 140 mg/dl 116 123 91  BUN 7.0 - 26.0 mg/dL 12.8 19.6 10.1  Creatinine 0.6 - 1.1 mg/dL 0.7 0.8 0.7  Sodium 136 - 145 mEq/L 137 137 133(L)  Potassium 3.5 - 5.1 mEq/L 3.8 4.0 4.2  CO2 22 - 29 mEq/L 27 23 24   Calcium 8.4 - 10.4 mg/dL 8.6 8.5 8.2(L)  Total Protein 6.4 - 8.3 g/dL 6.5 6.7 6.3(L)  Total Bilirubin 0.20 - 1.20 mg/dL 1.02 1.60(H) 1.81(H)  Alkaline Phos 40 - 150 U/L 345(H) 435(H) 487(H)  AST 5 - 34 U/L 80(H) 101(H) 106(H)  ALT 0 - 55 U/L 46 56(H) 49    STUDIES: No results found.  ASSESSMENT: 70 y.o. Maria Pruitt, [place] woman  (1) status post left breast biopsy in February of 2011 for an invasive lobular carcinoma measuring 2.9 cm by MRI, with a positive prechemotherapy axillary lymph node biopsy, strongly estrogen and progesterone receptor positive with no evidence of HER-2/neu amplification and an MIB-1 of 14%.   (  2) received 6 cycles of docetaxel/ cyclophosphamide in the neoadjuvant setting, completed in mid July of 2011   (3) s/p left lumpectomy and axillary lymph node dissection in August of 2011 for a ypT1b yTN3 (14 positive lymph nodes), Stage IIIC, grade 1 residual tumor. HER-2/neu was repeated, again not amplified.   (4) Completed loco-regional radiation in late October of 2011   (5) on letrozole starting early November 2011, continued to October 2016, with progression  METASTATIC DISEASE: September 2016: Involving left adrenal gland, regional nodes and bones  (6) biopsy of a 7.5 cm  left adrenal mass 05/30/2015 showed metastatic carcinoma, estrogen, progesterone, and HER-2 negative, with an MIB-1 of 90-100%; the tumor cells were cytokeratin 7 positive, cytokeratin 20 focally positive, gross cystic disease fluid protein negative  (a) the CA-27-29 is informative  (b) PET scan 06/17/2015 shows, in addition to the left adrenal mass, some periaortic adenopathy and multiple hypermetabolic bone metastases (L2, T9, T2 and others)  (c) EGD on 06/16/2015 to evaluate suspicious cardiac wall thickening showed no evidence of malignancy  (7) capecitabine started 07/04/2015 at 1.5 g BID 7/7. Discontinued 08/29/15 with progression  (8) denosumab/ Xgeva started 07/18/2015, repeated monthly  (9) eribulin D1/D8 q 21 days started 09/23/14. 1 complete cycle given before hospitalization. Discontinued due to elevation in LFTs.   (10) hospitalized for infuenza A on 09/26/15. Pulmonary embolus found. Lovenox started daily.  (11) hepatic encephalopathy during hospital stay in January 2017. Lactulose completed. Awaiting approval of rifaximin due to financial strain.   (12) carboplatin d1/d8 every 21 days started 11/07/2015  OTHER PROBLEMS: (a) the patient is status post hemorrhagic CVA January 2016  (b) congenital absence of left kidney  PLAN:  Wells looks and feels well today. The labs were reviewed in detail and her liver enzymes are trending downwards. The bilirubin has normalized. She will proceed with day 1, cycle 2 of carboplatin as planned.   Traeh will return in 1 week for 8 of treatment. She understands and agrees with this plan. She knows the goal of treatment in her case is control. She has been encouraged to call with any issues that might arise before her next visit here.   Laurie Panda, NP 11/28/2015 9:59 AM

## 2015-11-28 NOTE — Patient Instructions (Signed)

## 2015-11-28 NOTE — Telephone Encounter (Signed)
appt made and avs printed °

## 2015-12-05 ENCOUNTER — Ambulatory Visit (HOSPITAL_BASED_OUTPATIENT_CLINIC_OR_DEPARTMENT_OTHER): Payer: Medicare Other | Admitting: Nurse Practitioner

## 2015-12-05 ENCOUNTER — Other Ambulatory Visit: Payer: Self-pay | Admitting: Oncology

## 2015-12-05 ENCOUNTER — Ambulatory Visit (HOSPITAL_BASED_OUTPATIENT_CLINIC_OR_DEPARTMENT_OTHER): Payer: Medicare Other

## 2015-12-05 ENCOUNTER — Ambulatory Visit: Payer: Medicare Other

## 2015-12-05 ENCOUNTER — Other Ambulatory Visit (HOSPITAL_BASED_OUTPATIENT_CLINIC_OR_DEPARTMENT_OTHER): Payer: Medicare Other

## 2015-12-05 ENCOUNTER — Encounter: Payer: Self-pay | Admitting: Oncology

## 2015-12-05 ENCOUNTER — Encounter: Payer: Self-pay | Admitting: Nurse Practitioner

## 2015-12-05 VITALS — BP 141/70 | HR 66 | Temp 97.6°F | Resp 18 | Wt 172.3 lb

## 2015-12-05 DIAGNOSIS — C50912 Malignant neoplasm of unspecified site of left female breast: Secondary | ICD-10-CM | POA: Diagnosis not present

## 2015-12-05 DIAGNOSIS — Z171 Estrogen receptor negative status [ER-]: Secondary | ICD-10-CM

## 2015-12-05 DIAGNOSIS — Z95828 Presence of other vascular implants and grafts: Secondary | ICD-10-CM

## 2015-12-05 DIAGNOSIS — K5909 Other constipation: Secondary | ICD-10-CM

## 2015-12-05 DIAGNOSIS — C7951 Secondary malignant neoplasm of bone: Secondary | ICD-10-CM

## 2015-12-05 DIAGNOSIS — C50812 Malignant neoplasm of overlapping sites of left female breast: Secondary | ICD-10-CM

## 2015-12-05 DIAGNOSIS — C50412 Malignant neoplasm of upper-outer quadrant of left female breast: Secondary | ICD-10-CM

## 2015-12-05 DIAGNOSIS — C7972 Secondary malignant neoplasm of left adrenal gland: Secondary | ICD-10-CM | POA: Diagnosis not present

## 2015-12-05 DIAGNOSIS — Z5111 Encounter for antineoplastic chemotherapy: Secondary | ICD-10-CM | POA: Diagnosis present

## 2015-12-05 DIAGNOSIS — C773 Secondary and unspecified malignant neoplasm of axilla and upper limb lymph nodes: Secondary | ICD-10-CM | POA: Diagnosis not present

## 2015-12-05 LAB — COMPREHENSIVE METABOLIC PANEL
ALBUMIN: 2.9 g/dL — AB (ref 3.5–5.0)
ALK PHOS: 288 U/L — AB (ref 40–150)
ALT: 43 U/L (ref 0–55)
AST: 62 U/L — AB (ref 5–34)
Anion Gap: 8 mEq/L (ref 3–11)
BILIRUBIN TOTAL: 1.07 mg/dL (ref 0.20–1.20)
BUN: 15 mg/dL (ref 7.0–26.0)
CO2: 26 meq/L (ref 22–29)
Calcium: 8.8 mg/dL (ref 8.4–10.4)
Chloride: 103 mEq/L (ref 98–109)
Creatinine: 0.7 mg/dL (ref 0.6–1.1)
EGFR: 89 mL/min/{1.73_m2} — AB (ref >90)
GLUCOSE: 125 mg/dL (ref 70–140)
POTASSIUM: 3.7 meq/L (ref 3.5–5.1)
SODIUM: 137 meq/L (ref 136–145)
TOTAL PROTEIN: 6.6 g/dL (ref 6.4–8.3)

## 2015-12-05 LAB — CBC WITH DIFFERENTIAL/PLATELET
BASO%: 2.2 % — AB (ref 0.0–2.0)
Basophils Absolute: 0.1 10*3/uL (ref 0.0–0.1)
EOS%: 4.6 % (ref 0.0–7.0)
Eosinophils Absolute: 0.2 10*3/uL (ref 0.0–0.5)
HCT: 38.3 % (ref 34.8–46.6)
HEMOGLOBIN: 13.2 g/dL (ref 11.6–15.9)
LYMPH%: 26.8 % (ref 14.0–49.7)
MCH: 36.1 pg — ABNORMAL HIGH (ref 25.1–34.0)
MCHC: 34.3 g/dL (ref 31.5–36.0)
MCV: 105.2 fL — AB (ref 79.5–101.0)
MONO#: 0.5 10*3/uL (ref 0.1–0.9)
MONO%: 12.1 % (ref 0.0–14.0)
NEUT%: 54.3 % (ref 38.4–76.8)
NEUTROS ABS: 2.4 10*3/uL (ref 1.5–6.5)
Platelets: 142 10*3/uL — ABNORMAL LOW (ref 145–400)
RBC: 3.64 10*6/uL — AB (ref 3.70–5.45)
RDW: 14.3 % (ref 11.2–14.5)
WBC: 4.5 10*3/uL (ref 3.9–10.3)
lymph#: 1.2 10*3/uL (ref 0.9–3.3)

## 2015-12-05 MED ORDER — SODIUM CHLORIDE 0.9 % IV SOLN
Freq: Once | INTRAVENOUS | Status: AC
  Administered 2015-12-05: 10:00:00 via INTRAVENOUS

## 2015-12-05 MED ORDER — HEPARIN SOD (PORK) LOCK FLUSH 100 UNIT/ML IV SOLN
500.0000 [IU] | Freq: Once | INTRAVENOUS | Status: AC | PRN
  Administered 2015-12-05: 500 [IU]
  Filled 2015-12-05: qty 5

## 2015-12-05 MED ORDER — SODIUM CHLORIDE 0.9% FLUSH
10.0000 mL | INTRAVENOUS | Status: DC | PRN
  Administered 2015-12-05: 10 mL via INTRAVENOUS
  Filled 2015-12-05: qty 10

## 2015-12-05 MED ORDER — SODIUM CHLORIDE 0.9% FLUSH
10.0000 mL | INTRAVENOUS | Status: DC | PRN
  Administered 2015-12-05: 10 mL
  Filled 2015-12-05: qty 10

## 2015-12-05 MED ORDER — PALONOSETRON HCL INJECTION 0.25 MG/5ML
0.2500 mg | Freq: Once | INTRAVENOUS | Status: AC
  Administered 2015-12-05: 0.25 mg via INTRAVENOUS

## 2015-12-05 MED ORDER — SODIUM CHLORIDE 0.9 % IV SOLN
10.0000 mg | Freq: Once | INTRAVENOUS | Status: AC
  Administered 2015-12-05: 10 mg via INTRAVENOUS
  Filled 2015-12-05: qty 1

## 2015-12-05 MED ORDER — SODIUM CHLORIDE 0.9 % IV SOLN
138.0000 mg | Freq: Once | INTRAVENOUS | Status: AC
  Administered 2015-12-05: 140 mg via INTRAVENOUS
  Filled 2015-12-05: qty 14

## 2015-12-05 MED ORDER — PALONOSETRON HCL INJECTION 0.25 MG/5ML
INTRAVENOUS | Status: AC
  Filled 2015-12-05: qty 5

## 2015-12-05 NOTE — Patient Instructions (Signed)

## 2015-12-05 NOTE — Progress Notes (Signed)
Patient came in with some questions regarding bills she had received. Reviewed billing and noticed that the statement had been sent prior to the billing department submitting some dos to [employer] through which she has copay assistance. Advised patient that some of the charges on the bill had been submitted and she may check back in a few weeks for more to be submitted for payment. Advised patient she could call the number on the bill or follow up with myself if she had any additional questions. She also wanted to know how to apply for Medicaid. Gave patient a Medicaid application to completed and return to DSS in her county. Patient has my card for any additional questions or concerns.

## 2015-12-05 NOTE — Progress Notes (Signed)
. [name]   DOB: 13-Nov-1945  MR#: [id number]  VOP#:[id number]  Maria Nova, PA-C GYN:  SU:  OTHER MD:  CHIEF COMPLAINT: left adrenal metastatsis  CURRENT TREATMENT: denosumab, carboplatin  BREAST CANCER HISTORY:  from the original intake note:  Maria Pruitt herself palpated a change in her left breast, shortly before her mammogram was due.  She brought this to Dr. Wilder Pruitt attention and he set her up for diagnostic mammography on October 31, 2009.   Routine and implant displaced views of both breasts showed a 2 cm irregular mass in the outer left breast, seen only on the spot tangential view.  The breast tissue itself was fatty.  There were no suspicious calcifications.  This mass was palpable to Dr. Melanee Pruitt.  An ultrasound showed it to be 1.7 cm irregular and hypoechoic.  There were at least two enlarged left axillary lymph nodes identified.  Dr. Melanee Pruitt recommended biopsy which was performed the same day and showed ([id number]) and invasive breast cancer felt most likely to be ductal with lobular features.  The left axillary lymph node biopsy also showed the same tumor (similar morphologic findings) and both prognostic profiles showed the cancer to be strongly ER and PR positive with a low to borderline proliferation fraction (13 and 14%).  Neither mass showed amplification of Her-2 by CISH.  (The ratios were 1.1 and 0.85.)    With this information, the patient was referred to Dr. Margot Pruitt and bilateral breast MRIs were obtained March 1st.  This showed several left axillary lymph nodes with cortical thickening, the largest one measuring 1.1 cm.  The mass itself measured 2.9 cm by MRI.  It was spiculated and irregular.  There was no evidence of contralateral disease.   She was treated neoadjuvantly with docetaxel and cyclophosphamide for 6 cycles completed in July 2011, after which she underwent left lumpectomy and axillary dissection August 2011 for a ypT1b yTN3 (14 positive lymph nodes),  Stage IIIC, grade 1 residual tumor. HER-2/neu was repeated, again not amplified. After completing local regional radiation October 2011 she started letrozole November 2011, continued to September 2016, which she was found to have metastatic disease   METASTATIC DISEASE HISTORY: From the 06/08/2015 summary:   Maria Pruitt returns today for a new problem accompanied by her sister Maria Pruitt. To summarize the recent history: She had a hemorrhagic stroke in January 2016 with some residuals. She has been living next door to her son in the [place] area since then.--In August she presented to her primary care physician, Dr. Thornton Pruitt, with a complaint of abdominal discomfort, nausea and vomiting. He treated her for a UTI w/o resolution and obtained a KUB in his office; this was nondiagnostic. Accordingly he set up the patient for CT scan of the abdomen and pelvis 05/06/2015. This showed the left adrenal gland to be enlarged to 7.5 x 4.0 cm. There was eccentric wall thickening of the gastric cardia. There were small lymph nodes in the upper abdomen and retroperitoneum. There was no liver involvement. The right adrenal gland was unremarkable. There was severe left kidney atrophy (congenital).  The patient was then set up for biopsy of the left adrenal mass on 05/30/2015. This showed Maria Pruitt [id number]) metastatic carcinoma which was cytokeratin 7 positive, with rare cytokeratin 20 positivity and was negative for gross cystic disease fluid protein. The prognostic panel showed this tumor to be estrogen and progesterone receptor negative, HER-2 negative, with an MIB-1 of 90-100%.   PET scan10/03/2015 which showed in additional to the  left adrenal mass, some regional lymph nodes and multiple bone lesions. There was no obvious primary tumor. She also had an EGD to evaluate a possible gastric primary suggested by the yearly a CT scan of the chest. This was normal. We also obtained tumor markerswhich showed a normal CA 19,a mildly  elevated CEA at 9.7,Maria Pruitt is significantly elevated CA-27-29 at 125. Given this data, the most likely interpretation is that we are dealing with an estrogen receptor negative recurrence of her earlier estrogen receptor positive breast cancer, now stage IV  Her subsequent history is as detailed below  INTERVAL HISTORY: Maria Pruitt returns today for follow-up of her metastatic breast cancer, accompanied by her sister. Today is day 8, cycle 2 of carboplatin.   REVIEW OF SYSTEMS: Maria Pruitt continues to tolerate treatment well with few complaints. She denies fevers, chills, nausea, or vomiting. She is moving her bowels well but the stools are becoming firmer and harder to pass. Her appetite is good. She denies mouth sores or rashes. Her energy level is good. She continues on lovenox with no abnormal bruising. She has noticed they have had to apply more pressure when deaccessing her port because it bleeds freer, however. She has no pain, but does feel fullness to the left flank near her kidneys. A detailed review of systems is otherwise stable.  PAST MEDICAL HISTORY: Past Medical History  Diagnosis Date  . Hypertension   . Breast cancer ([place]) 2011    Stage 3  . Atrial fibrillation ([place])   . Hypercholesterolemia   . Abnormal ECG   . Allergy     seasonal  . Arthritis   . GERD (gastroesophageal reflux disease)   . 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 Seizures ([place])   . Stroke ([place])     dec 15  . Thyroid disease   . Adrenal cancer ([place])   . Fatty liver   . Influenza A    1. Significant for pituitary adenoma which was removed in 1983 but recurred, requiring radiation and briefly some Parlodel.  She has been off treatment since 1985.  She has some acromegaly secondary to this tumor.   2. She is status post bilateral submuscular breast implants under Dr. Towanda Malkin in 1987.   3. She is status post C-section for her third child who was hydrocephalic.   4. She underwent rhinoplasty  in 1984.   5. She has a history of hypertension. 6. History of mitral valve prolapse, but she says she has not been receiving antibiotics prior to dental or similar procedures.   7. History of hypothyroidism.   8. History of congenital absence of one kidney.   9. History of hyperlipidemia.  10. History of obesity.   11. History of renal stones.   12. History of fatty liver.   13. History of gout.   14. History of palpitations.   History of childhood asthma  PAST SURGICAL HISTORY: Past Surgical History  Procedure Laterality Date  . Cesarean section  1980  . Breast enhancement surgery  1987  . Umbilical hernia repair    . Tubal ligation    . Scar revision    . Pituitary surgery      adenoma  . Rhinoplasty  1985  . Breast lumpectomy Left   . Colonoscopy      FAMILY HISTORY Family History  Problem Relation Age of Onset  . Heart disease Mother   . Heart attack Father   . Heart attack  Brother   . Colon cancer Neg Hx    The patient's father died from a myocardial infarction at the age of 23.  The patient's mother died from complications of congestive heart failure at the age of 40.  The patient had one brother who died from a myocardial infarction at the age of 72.  One of the brothers and two sisters are alive and well.  Sr. Maria Pruitt is an ICU nurse. There is no history of breast or ovarian cancer in the family to her knowledge.  GYNECOLOGIC HISTORY: She is GX P3, first pregnancy to term at age 42.  She went through the change of life at the time of her pituitary surgery in 1983.  She took hormone replacement for less than a year because of poor tolerance.    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  She gave up her job in 08-18-10.  Her first husband died from chronic myeloid leukemia in 1985.  Her three children from that marriage include her son Randall Hiss who died from complications of hydrocephaly at  age 45; son [name] who is a Insurance underwriter and son [name] who is an Public house manager in this area.  The patient has five grandchildren.  Her second husband of 20+ years, Ron, is a Company secretary of an independent [employer] and also does Writer and is a Oceanographer. He is now disabled due toa traumatic brain injury. He lost his first wife to endometrial cancer.  He has a daughter from that marriage.  She lives in [place]: not in place  HEALTH MAINTENANCE: Social History  Substance Use Topics  . Smoking status: Never Smoker   . Smokeless tobacco: Never Used  . Alcohol Use: No     Colonoscopy:  PAP:  Bone density: January 2012/ osteopenia  Lipid panel:  Allergies  Allergen Reactions  . Ciprofloxacin Other (See Comments)  . Clonidine Derivatives Other (See Comments)  . Phenytoin     Other reaction(s): Other Elevated LFTs  . Sulfa Antibiotics Hives  . Benzonatate     REACTION: Swelling, tessalon perles; tolerates benadryl  . Codeine     REACTION: Nausea    Current Outpatient Prescriptions  Medication Sig Dispense Refill  . Ascorbic Acid (VITAMIN C) 100 MG tablet Take 1,000 mg by mouth daily.     . calcium carbonate (TUMS - DOSED IN MG ELEMENTAL CALCIUM) 500 MG chewable tablet Chew 1 tablet (200 mg of elemental calcium total) by mouth 3 (three) times daily. 30 tablet 0  . enoxaparin (LOVENOX) 40 MG/0.4ML injection Inject 0.4 mLs (40 mg total) into the skin daily. 30 Syringe 1  . famotidine (PEPCID) 20 MG tablet Take 1 tablet (20 mg total) by mouth 2 (two) times daily. 180 tablet 4  . hydrochlorothiazide (HYDRODIURIL) 25 MG tablet Take 1 tablet (25 mg total) by mouth daily.    Marland Kitchen levothyroxine (SYNTHROID, LEVOTHROID) 100 MCG tablet Take 88 mcg by mouth daily before breakfast.    . loratadine (CLARITIN) 10 MG tablet Take 10 mg by mouth daily.    Marland Kitchen losartan (COZAAR) 50 MG tablet Take 50 mg by mouth daily.    . metoCLOPramide (REGLAN) 5 MG tablet Take 1 tablet (5 mg  total) by mouth 3 (three) times daily as needed for nausea. 30 tablet 1  . metoprolol (LOPRESSOR) 50 MG tablet Take 50 mg by mouth 2 (two) times daily.    Marland Kitchen  aspirin (ASPIRIN CHILDRENS) 81 MG chewable tablet Chew 1 tablet (81 mg total) by mouth daily. (Patient not taking: Reported on 12/05/2015) 100 tablet 4  . lactulose (CHRONULAC) 10 GM/15ML solution Take 45 mLs (30 g total) by mouth 2 (two) times daily. (Patient not taking: Reported on 11/28/2015) 240 mL 1  . oxyCODONE (OXY IR/ROXICODONE) 5 MG immediate release tablet Take 1 tablet (5 mg total) by mouth every 4 (four) hours as needed for severe pain. (Patient not taking: Reported on 11/28/2015) 30 tablet 0   No current facility-administered medications for this visit.   Facility-Administered Medications Ordered in Other Visits  Medication Dose Route Frequency Provider Last Rate Last Dose  . 0.9 %  sodium chloride infusion   Intravenous Once Maria Cruel, MD        OBJECTIVE: Middle-aged white woman in no acute distress Filed Vitals:   12/05/15 0906  BP: 141/70  Pulse: 66  Temp: 97.6 F (36.4 C)  Resp: 18     Body mass index is 30.99 kg/(m^2).    ECOG FS: 1  Skin: warm, dry  HEENT: sclerae anicteric, conjunctivae pink, oropharynx clear. No thrush or mucositis.  Lymph Nodes: No cervical or supraclavicular lymphadenopathy  Lungs: clear to auscultation bilaterally, no rales, wheezes, or rhonci  Heart: regular rate and rhythm  Abdomen: round, soft, non tender, positive bowel sounds  Musculoskeletal: No focal spinal tenderness, no peripheral edema  Neuro: non focal, well oriented, positive affect  Breasts: deferred  LAB RESULTS: CBC Latest Ref Rng 12/05/2015 11/28/2015 11/14/2015  WBC 3.9 - 10.3 10e3/uL 4.5 3.9 4.7  Hemoglobin 11.6 - 15.9 g/dL 13.2 12.7 12.6  Hematocrit 34.8 - 46.6 % 38.3 36.9 37.9  Platelets 145 - 400 10e3/uL 142(L) 167 192     CMP Latest Ref Rng 12/05/2015 11/28/2015 11/14/2015  Glucose 70 - 140 mg/dl 125 116 123   BUN 7.0 - 26.0 mg/dL 15.0 12.8 19.6  Creatinine 0.6 - 1.1 mg/dL 0.7 0.7 0.8  Sodium 136 - 145 mEq/L 137 137 137  Potassium 3.5 - 5.1 mEq/L 3.7 3.8 4.0  CO2 22 - 29 mEq/L 26 27 23   Calcium 8.4 - 10.4 mg/dL 8.8 8.6 8.5  Total Protein 6.4 - 8.3 g/dL 6.6 6.5 6.7  Total Bilirubin 0.20 - 1.20 mg/dL 1.07 1.02 1.60(H)  Alkaline Phos 40 - 150 U/L 288(H) 345(H) 435(H)  AST 5 - 34 U/L 62(H) 80(H) 101(H)  ALT 0 - 55 U/L 43 46 56(H)    STUDIES: No results found.  ASSESSMENT: 70 y.o. [place], [place] woman  (1) status post left breast biopsy in February of 2011 for an invasive lobular carcinoma measuring 2.9 cm by MRI, with a positive prechemotherapy axillary lymph node biopsy, strongly estrogen and progesterone receptor positive with no evidence of HER-2/neu amplification and an MIB-1 of 14%.   (2) received 6 cycles of docetaxel/ cyclophosphamide in the neoadjuvant setting, completed in mid July of 2011   (3) s/p left lumpectomy and axillary lymph node dissection in August of 2011 for a ypT1b yTN3 (14 positive lymph nodes), Stage IIIC, grade 1 residual tumor. HER-2/neu was repeated, again not amplified.   (4) Completed loco-regional radiation in late October of 2011   (5) on letrozole starting early November 2011, continued to October 2016, with progression  METASTATIC DISEASE: September 2016: Involving left adrenal gland, regional nodes and bones  (6) biopsy of a 7.5 cm left adrenal mass 05/30/2015 showed metastatic carcinoma, estrogen, progesterone, and HER-2 negative, with an MIB-1 of  90-100%; the tumor cells were cytokeratin 7 positive, cytokeratin 20 focally positive, gross cystic disease fluid protein negative  (a) the CA-27-29 is informative  (b) PET scan 06/17/2015 shows, in addition to the left adrenal mass, some periaortic adenopathy and multiple hypermetabolic bone metastases (L2, T9, T2 and others)  (c) EGD on 06/16/2015 to evaluate suspicious cardiac wall thickening  showed no evidence of malignancy  (7) capecitabine started 07/04/2015 at 1.5 g BID 7/7. Discontinued 08/29/15 with progression  (8) denosumab/ Xgeva started 07/18/2015, repeated monthly  (9) eribulin D1/D8 q 21 days started 09/23/14. 1 complete cycle given before hospitalization. Discontinued due to elevation in LFTs.   (10) hospitalized for infuenza A on 09/26/15. Pulmonary embolus found. Lovenox started daily.  (11) hepatic encephalopathy during hospital stay in January 2017. Lactulose completed. Awaiting approval of rifaximin due to financial strain.   (12) carboplatin d1/d8 every 21 days started 11/07/2015  OTHER PROBLEMS: (a) the patient is status post hemorrhagic CVA January 2016  (b) congenital absence of left kidney  PLAN:  Maria Pruitt has no new complaints with treatment. I did advise she start a stool softener to avoid straining. The labs were reviewed in detail. The liver enzymes continue to improve with a normal bilirubin. She will proceed with day 8, cycle 2 of carboplatin as planned.   She is due for a repeat mammogram this month, so I have placed orders for this to be performed at the breast center.   Ameshia will return in 2 weeks for cycle 3 of treatment. She understands and agrees with this plan. She knows the goal of treatment in her case is control. She has been encouraged to call with any issues that might arise before her next visit here.   Laurie Panda, NP 12/05/2015 9:32 AM

## 2015-12-05 NOTE — Patient Instructions (Signed)
[facility] Discharge Instructions for Patients Receiving Chemotherapy  Today you received the following chemotherapy agents: Carboplatin   To help prevent nausea and vomiting after your treatment, we encourage you to take your nausea medication as directed.    If you develop nausea and vomiting that is not controlled by your nausea medication, call the clinic.   BELOW ARE SYMPTOMS THAT SHOULD BE REPORTED IMMEDIATELY:  *FEVER GREATER THAN 100.5 F  *CHILLS WITH OR WITHOUT FEVER  NAUSEA AND VOMITING THAT IS NOT CONTROLLED WITH YOUR NAUSEA MEDICATION  *UNUSUAL SHORTNESS OF BREATH  *UNUSUAL BRUISING OR BLEEDING  TENDERNESS IN MOUTH AND THROAT WITH OR WITHOUT PRESENCE OF ULCERS  *URINARY PROBLEMS  *BOWEL PROBLEMS  UNUSUAL RASH Items with * indicate a potential emergency and should be followed up as soon as possible.  Feel free to call the clinic you have any questions or concerns. The clinic phone number is [phone].  Please show the CHEMO ALERT CARD at check-in to the Emergency Department and triage nurse.   

## 2015-12-06 LAB — CANCER ANTIGEN 27.29: CAN 27.29: 45.2 U/mL — AB (ref 0.0–38.6)

## 2015-12-08 ENCOUNTER — Other Ambulatory Visit: Payer: Self-pay | Admitting: Nurse Practitioner

## 2015-12-08 DIAGNOSIS — C50912 Malignant neoplasm of unspecified site of left female breast: Secondary | ICD-10-CM

## 2015-12-08 DIAGNOSIS — C7951 Secondary malignant neoplasm of bone: Principal | ICD-10-CM

## 2015-12-16 ENCOUNTER — Other Ambulatory Visit: Payer: Self-pay | Admitting: *Deleted

## 2015-12-16 MED ORDER — FAMOTIDINE 20 MG PO TABS: 20.0000 mg | ORAL_TABLET | Freq: Two times a day (BID) | ORAL | Status: AC

## 2015-12-19 ENCOUNTER — Encounter: Payer: Self-pay | Admitting: Nurse Practitioner

## 2015-12-19 ENCOUNTER — Other Ambulatory Visit (HOSPITAL_BASED_OUTPATIENT_CLINIC_OR_DEPARTMENT_OTHER): Payer: Medicare Other

## 2015-12-19 ENCOUNTER — Ambulatory Visit (HOSPITAL_BASED_OUTPATIENT_CLINIC_OR_DEPARTMENT_OTHER): Payer: Medicare Other | Admitting: Nurse Practitioner

## 2015-12-19 ENCOUNTER — Ambulatory Visit: Payer: Medicare Other

## 2015-12-19 ENCOUNTER — Ambulatory Visit (HOSPITAL_BASED_OUTPATIENT_CLINIC_OR_DEPARTMENT_OTHER): Payer: Medicare Other

## 2015-12-19 VITALS — BP 129/68 | HR 61 | Temp 97.7°F | Resp 17 | Ht 62.5 in | Wt 171.3 lb

## 2015-12-19 DIAGNOSIS — Z95828 Presence of other vascular implants and grafts: Secondary | ICD-10-CM

## 2015-12-19 DIAGNOSIS — C50919 Malignant neoplasm of unspecified site of unspecified female breast: Secondary | ICD-10-CM

## 2015-12-19 DIAGNOSIS — C50412 Malignant neoplasm of upper-outer quadrant of left female breast: Secondary | ICD-10-CM

## 2015-12-19 DIAGNOSIS — C7951 Secondary malignant neoplasm of bone: Secondary | ICD-10-CM

## 2015-12-19 DIAGNOSIS — G8929 Other chronic pain: Secondary | ICD-10-CM

## 2015-12-19 DIAGNOSIS — Z5111 Encounter for antineoplastic chemotherapy: Secondary | ICD-10-CM

## 2015-12-19 DIAGNOSIS — C7972 Secondary malignant neoplasm of left adrenal gland: Secondary | ICD-10-CM

## 2015-12-19 DIAGNOSIS — R1032 Left lower quadrant pain: Secondary | ICD-10-CM

## 2015-12-19 DIAGNOSIS — C50912 Malignant neoplasm of unspecified site of left female breast: Secondary | ICD-10-CM | POA: Diagnosis not present

## 2015-12-19 DIAGNOSIS — K5900 Constipation, unspecified: Secondary | ICD-10-CM

## 2015-12-19 DIAGNOSIS — R109 Unspecified abdominal pain: Secondary | ICD-10-CM

## 2015-12-19 LAB — CBC WITH DIFFERENTIAL/PLATELET
BASO%: 1.1 % (ref 0.0–2.0)
BASOS ABS: 0 10*3/uL (ref 0.0–0.1)
EOS%: 3.2 % (ref 0.0–7.0)
Eosinophils Absolute: 0.1 10*3/uL (ref 0.0–0.5)
HEMATOCRIT: 39.9 % (ref 34.8–46.6)
HEMOGLOBIN: 13.9 g/dL (ref 11.6–15.9)
LYMPH#: 1.2 10*3/uL (ref 0.9–3.3)
LYMPH%: 27.2 % (ref 14.0–49.7)
MCH: 35.9 pg — ABNORMAL HIGH (ref 25.1–34.0)
MCHC: 34.9 g/dL (ref 31.5–36.0)
MCV: 102.7 fL — ABNORMAL HIGH (ref 79.5–101.0)
MONO#: 0.6 10*3/uL (ref 0.1–0.9)
MONO%: 14.1 % — ABNORMAL HIGH (ref 0.0–14.0)
NEUT#: 2.3 10*3/uL (ref 1.5–6.5)
NEUT%: 54.4 % (ref 38.4–76.8)
PLATELETS: 147 10*3/uL (ref 145–400)
RBC: 3.88 10*6/uL (ref 3.70–5.45)
RDW: 13.6 % (ref 11.2–14.5)
WBC: 4.3 10*3/uL (ref 3.9–10.3)

## 2015-12-19 LAB — COMPREHENSIVE METABOLIC PANEL
ALBUMIN: 3.1 g/dL — AB (ref 3.5–5.0)
ALK PHOS: 272 U/L — AB (ref 40–150)
ALT: 28 U/L (ref 0–55)
ANION GAP: 7 meq/L (ref 3–11)
AST: 42 U/L — ABNORMAL HIGH (ref 5–34)
BILIRUBIN TOTAL: 1.02 mg/dL (ref 0.20–1.20)
BUN: 10.5 mg/dL (ref 7.0–26.0)
CALCIUM: 8.8 mg/dL (ref 8.4–10.4)
CHLORIDE: 99 meq/L (ref 98–109)
CO2: 26 mEq/L (ref 22–29)
CREATININE: 0.7 mg/dL (ref 0.6–1.1)
EGFR: 85 mL/min/{1.73_m2} — ABNORMAL LOW (ref >90)
Glucose: 102 mg/dl (ref 70–140)
Potassium: 3.9 mEq/L (ref 3.5–5.1)
Sodium: 132 mEq/L — ABNORMAL LOW (ref 136–145)
Total Protein: 6.8 g/dL (ref 6.4–8.3)

## 2015-12-19 MED ORDER — PALONOSETRON HCL INJECTION 0.25 MG/5ML
0.2500 mg | Freq: Once | INTRAVENOUS | Status: AC
  Administered 2015-12-19: 0.25 mg via INTRAVENOUS

## 2015-12-19 MED ORDER — PALONOSETRON HCL INJECTION 0.25 MG/5ML
INTRAVENOUS | Status: AC
  Filled 2015-12-19: qty 5

## 2015-12-19 MED ORDER — DENOSUMAB 120 MG/1.7ML ~~LOC~~ SOLN
120.0000 mg | Freq: Once | SUBCUTANEOUS | Status: AC
  Administered 2015-12-19: 120 mg via SUBCUTANEOUS
  Filled 2015-12-19: qty 1.7

## 2015-12-19 MED ORDER — HEPARIN SOD (PORK) LOCK FLUSH 100 UNIT/ML IV SOLN
500.0000 [IU] | Freq: Once | INTRAVENOUS | Status: AC | PRN
  Administered 2015-12-19: 500 [IU]
  Filled 2015-12-19: qty 5

## 2015-12-19 MED ORDER — SODIUM CHLORIDE 0.9% FLUSH
10.0000 mL | INTRAVENOUS | Status: DC | PRN
  Administered 2015-12-19: 10 mL via INTRAVENOUS
  Filled 2015-12-19: qty 10

## 2015-12-19 MED ORDER — SODIUM CHLORIDE 0.9 % IV SOLN
10.0000 mg | Freq: Once | INTRAVENOUS | Status: AC
  Administered 2015-12-19: 10 mg via INTRAVENOUS
  Filled 2015-12-19: qty 1

## 2015-12-19 MED ORDER — SODIUM CHLORIDE 0.9 % IV SOLN
138.0000 mg | Freq: Once | INTRAVENOUS | Status: AC
  Administered 2015-12-19: 140 mg via INTRAVENOUS
  Filled 2015-12-19: qty 14

## 2015-12-19 MED ORDER — SODIUM CHLORIDE 0.9 % IV SOLN
Freq: Once | INTRAVENOUS | Status: AC
  Administered 2015-12-19: 13:00:00 via INTRAVENOUS

## 2015-12-19 MED ORDER — SODIUM CHLORIDE 0.9% FLUSH
10.0000 mL | INTRAVENOUS | Status: DC | PRN
  Administered 2015-12-19: 10 mL
  Filled 2015-12-19: qty 10

## 2015-12-19 NOTE — Progress Notes (Signed)
. ID: Maria Pruitt   DOB: [birth year]  MR#: [id number]  DGL#:[id number]  Maria Nova, PA-C GYN:  SU:  OTHER MD:  CHIEF COMPLAINT: left adrenal metastatsis  CURRENT TREATMENT: denosumab, carboplatin  BREAST CANCER HISTORY:  from the original intake note:  Maria Pruitt herself palpated a change in her left breast, shortly before her mammogram was due.  She brought this to [name] attention and he set her up for diagnostic mammography on October 31, 2009.   Routine and implant displaced views of both breasts showed a 2 cm irregular mass in the outer left breast, seen only on the spot tangential view.  The breast tissue itself was fatty.  There were no suspicious calcifications.  This mass was palpable to Dr. [name].  An ultrasound showed it to be 1.7 cm irregular and hypoechoic.  There were at least two enlarged left axillary lymph nodes identified.  Dr. [name] recommended biopsy which was performed the same day and showed ([id number]) and invasive breast cancer felt most likely to be ductal with lobular features.  The left axillary lymph node biopsy also showed the same tumor (similar morphologic findings) and both prognostic profiles showed the cancer to be strongly ER and PR positive with a low to borderline proliferation fraction (13 and 14%).  Neither mass showed amplification of Her-2 by CISH.  (The ratios were 1.1 and 0.85.)    With this information, the patient was referred to Dr. [name] and bilateral breast MRIs were obtained March 1st.  This showed several left axillary lymph nodes with cortical thickening, the largest one measuring 1.1 cm.  The mass itself measured 2.9 cm by MRI.  It was spiculated and irregular.  There was no evidence of contralateral disease.   She was treated neoadjuvantly with docetaxel and cyclophosphamide for 6 cycles completed in July 2011, after which she underwent left lumpectomy and axillary dissection August 2011 for a ypT1b yTN3 (14 positive lymph nodes),  Stage IIIC, grade 1 residual tumor. HER-2/neu was repeated, again not amplified. After completing local regional radiation October 2011 she started letrozole November 2011, continued to September 2016, which she was found to have metastatic disease   METASTATIC DISEASE HISTORY: From the 06/08/2015 summary:   Maria Pruitt returns today for a new problem accompanied by her sister [name]. To summarize the recent history: She had a hemorrhagic stroke in January 2016 with some residuals. She has been living next door to her son in the [place] area since then.--In August she presented to her primary care physician, Dr. Thornton Papas, with a complaint of abdominal discomfort, nausea and vomiting. He treated her for a UTI w/o resolution and obtained a KUB in his office; this was nondiagnostic. Accordingly he set up the patient for CT scan of the abdomen and pelvis 05/06/2015. This showed the left adrenal gland to be enlarged to 7.5 x 4.0 cm. There was eccentric wall thickening of the gastric cardia. There were small lymph nodes in the upper abdomen and retroperitoneum. There was no liver involvement. The right adrenal gland was unremarkable. There was severe left kidney atrophy (congenital).  The patient was then set up for biopsy of the left adrenal mass on 05/30/2015. This showed [name] [id number]) metastatic carcinoma which was cytokeratin 7 positive, with rare cytokeratin 20 positivity and was negative for gross cystic disease fluid protein. The prognostic panel showed this tumor to be estrogen and progesterone receptor negative, HER-2 negative, with an MIB-1 of 90-100%.   PET scan10/03/2015 which showed in additional to the  left adrenal mass, some regional lymph nodes and multiple bone lesions. There was no obvious primary tumor. She also had an EGD to evaluate a possible gastric primary suggested by the yearly a CT scan of the chest. This was normal. We also obtained tumor markerswhich showed a normal CA 19,a mildly  elevated CEA at 9.7,Maria Pruitt is significantly elevated CA-27-29 at 125. Given this data, the most likely interpretation is that we are dealing with an estrogen receptor negative recurrence of her earlier estrogen receptor positive breast cancer, now stage IV  Her subsequent history is as detailed below  INTERVAL HISTORY: Daysha returns today for follow-up of her metastatic breast cancer, accompanied by her sister. Today is day 1, cycle 5 of dose reduced carboplatin.   REVIEW OF SYSTEMS: This past Thursday, the pain to her left flank has started up again. It does not radiate anywhere but can appear in the lower left quadrant at times. The pain is enough to keep her up at night. She took just 7m oxycodone on Thursday night and Friday night. She does not use it during the day because it constipates her. She has been taking 2 sennakot tablets daily and is having trouble "clearing out." She denies fevers, chills, nausea, or vomiting. Her appetite is down while she is iA detailed review of systems is otherwise stable.n pain, but gets better after steroid use during treatment week. Her energy level is good. She denies mouth sores or rashes. She continues on lovenox with no abnormal bleeding or bruising. A detailed review of systems is otherwise stable.  PAST MEDICAL HISTORY: Past Medical History  Diagnosis Date  . Hypertension   . Breast cancer ([facility] 2011    Stage 3  . Atrial fibrillation ([place]   . Hypercholesterolemia   . Abnormal ECG   . Allergy     seasonal  . Arthritis   . GERD (gastroesophageal reflux disease)   . 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 Seizures ([facility]   . Stroke ([facility]     dec 15  . Thyroid disease   . Adrenal cancer ([facility]   . Fatty liver   . Influenza A    1. Significant for pituitary adenoma which was removed in 1983 but recurred, requiring radiation and briefly some Parlodel.  She has been off treatment since 1985.  She has some acromegaly secondary  to this tumor.   2. She is status post bilateral submuscular breast implants under Dr. TTowanda Malkinin 1987.   3. She is status post C-section for her third child who was hydrocephalic.   4. She underwent rhinoplasty in 1984.   5. She has a history of hypertension. 6. History of mitral valve prolapse, but she says she has not been receiving antibiotics prior to dental or similar procedures.   7. History of hypothyroidism.   8. History of congenital absence of one kidney.   9. History of hyperlipidemia.  10. History of obesity.   11. History of renal stones.   12. History of fatty liver.   13. History of gout.   14. History of palpitations.   History of childhood asthma  PAST SURGICAL HISTORY: Past Surgical History  Procedure Laterality Date  . Cesarean section  1980  . Breast enhancement surgery  1987  . Umbilical hernia repair    . Tubal ligation    . Scar revision    . Pituitary surgery      adenoma  . Rhinoplasty  1985  . Breast lumpectomy Left   . Colonoscopy      FAMILY HISTORY Family History  Problem Relation Age of Onset  . Heart disease Mother   . Heart attack Father   . Heart attack Brother   . Colon cancer Neg Hx    The patient's father died from a myocardial infarction at the age of 86.  The patient's mother died from complications of congestive heart failure at the age of 53.  The patient had one brother who died from a myocardial infarction at the age of 45.  One of the brothers and two sisters are alive and well.  Sr. [name] is an ICU nurse. There is no history of breast or ovarian cancer in the family to her knowledge.  GYNECOLOGIC HISTORY: She is GX P3, first pregnancy to term at age 78.  She went through the change of life at the time of her pituitary surgery in 1983.  She took hormone replacement for less than a year because of poor tolerance.    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  She gave up her job in 08-29-10.  Her first husband died from chronic myeloid leukemia in 1985.  Her three children from that marriage include her son Randall Hiss who died from complications of hydrocephaly at age 20; son [name] who is a Insurance underwriter and son [name] who is an Public house manager in this area.  The patient has five grandchildren.  Her second husband of 20+ years, Ron, is a Company secretary of an independent [employer] and also does Writer and is a Oceanographer. He is now disabled due toa traumatic brain injury. He lost his first wife to endometrial cancer.  He has a daughter from that marriage.  She lives in [place]: not in place  HEALTH MAINTENANCE: Social History  Substance Use Topics  . Smoking status: Never Smoker   . Smokeless tobacco: Never Used  . Alcohol Use: No     Colonoscopy:  PAP:  Bone density: January 2012/ osteopenia  Lipid panel:  Allergies  Allergen Reactions  . Ciprofloxacin Other (See Comments)  . Clonidine Derivatives Other (See Comments)  . Phenytoin     Other reaction(s): Other Elevated LFTs  . Sulfa Antibiotics Hives  . Benzonatate     REACTION: Swelling, tessalon perles; tolerates benadryl  . Codeine     REACTION: Nausea    Current Outpatient Prescriptions  Medication Sig Dispense Refill  . Ascorbic Acid (VITAMIN C) 100 MG tablet Take 1,000 mg by mouth daily.     . calcium carbonate (TUMS - DOSED IN MG ELEMENTAL CALCIUM) 500 MG chewable tablet Chew 1 tablet (200 mg of elemental calcium total) by mouth 3 (three) times daily. 30 tablet 0  . enoxaparin (LOVENOX) 40 MG/0.4ML injection Inject 0.4 mLs (40 mg total) into the skin daily. 30 Syringe 1  . famotidine (PEPCID) 20 MG tablet Take 1 tablet (20 mg total) by mouth 2 (two) times daily. 180 tablet 4  . hydrochlorothiazide (HYDRODIURIL) 25 MG tablet Take 1 tablet (25 mg total) by mouth daily.    Marland Kitchen levothyroxine (SYNTHROID, LEVOTHROID) 100 MCG tablet Take 88 mcg by mouth daily  before breakfast.    . loratadine (CLARITIN) 10 MG tablet Take 10 mg by mouth daily.    Marland Kitchen losartan (COZAAR) 50 MG tablet Take 50 mg by mouth daily.    . metoCLOPramide (  REGLAN) 5 MG tablet Take 1 tablet (5 mg total) by mouth 3 (three) times daily as needed for nausea. 30 tablet 1  . metoprolol (LOPRESSOR) 50 MG tablet Take 50 mg by mouth 2 (two) times daily.    Marland Kitchen oxyCODONE (OXY IR/ROXICODONE) 5 MG immediate release tablet Take 1 tablet (5 mg total) by mouth every 4 (four) hours as needed for severe pain. 30 tablet 0  . aspirin (ASPIRIN CHILDRENS) 81 MG chewable tablet Chew 1 tablet (81 mg total) by mouth daily. (Patient not taking: Reported on 12/05/2015) 100 tablet 4  . lactulose (CHRONULAC) 10 GM/15ML solution Take 45 mLs (30 g total) by mouth 2 (two) times daily. (Patient not taking: Reported on 11/28/2015) 240 mL 1   No current facility-administered medications for this visit.   Facility-Administered Medications Ordered in Other Visits  Medication Dose Route Frequency Provider Last Rate Last Dose  . 0.9 %  sodium chloride infusion   Intravenous Once [name], MD        OBJECTIVE: Middle-aged white woman in no acute distress Filed Vitals:   12/19/15 1136  BP: 129/68  Pulse: 61  Temp: 97.7 F (36.5 C)  Resp: 17     Body mass index is 30.81 kg/(m^2).    ECOG FS: 1  Sclerae unicteric, pupils round and equal Oropharynx clear and moist-- no thrush or other lesions No cervical or supraclavicular adenopathy Lungs no rales or rhonchi Heart regular rate and rhythm Abd soft, nontender, positive bowel sounds MSK no focal spinal tenderness, no upper extremity lymphedema Neuro: nonfocal, well oriented, appropriate affect Breasts: deferred  LAB RESULTS: CBC Latest Ref Rng 12/19/2015 12/05/2015 11/28/2015  WBC 3.9 - 10.3 10e3/uL 4.3 4.5 3.9  Hemoglobin 11.6 - 15.9 g/dL 13.9 13.2 12.7  Hematocrit 34.8 - 46.6 % 39.9 38.3 36.9  Platelets 145 - 400 10e3/uL 147 142(L) 167     CMP  Latest Ref Rng 12/19/2015 12/05/2015 11/28/2015  Glucose 70 - 140 mg/dl 102 125 116  BUN 7.0 - 26.0 mg/dL 10.5 15.0 12.8  Creatinine 0.6 - 1.1 mg/dL 0.7 0.7 0.7  Sodium 136 - 145 mEq/L 132(L) 137 137  Potassium 3.5 - 5.1 mEq/L 3.9 3.7 3.8  CO2 22 - 29 mEq/L 26 26 27   Calcium 8.4 - 10.4 mg/dL 8.8 8.8 8.6  Total Protein 6.4 - 8.3 g/dL 6.8 6.6 6.5  Total Bilirubin 0.20 - 1.20 mg/dL 1.02 1.07 1.02  Alkaline Phos 40 - 150 U/L 272(H) 288(H) 345(H)  AST 5 - 34 U/L 42(H) 62(H) 80(H)  ALT 0 - 55 U/L 28 43 46    STUDIES: No results found.  ASSESSMENT: 70 y.o. [place], [place] woman  (1) status post left breast biopsy in February of 2011 for an invasive lobular carcinoma measuring 2.9 cm by MRI, with a positive prechemotherapy axillary lymph node biopsy, strongly estrogen and progesterone receptor positive with no evidence of HER-2/neu amplification and an MIB-1 of 14%.   (2) received 6 cycles of docetaxel/ cyclophosphamide in the neoadjuvant setting, completed in mid July of 2011   (3) s/p left lumpectomy and axillary lymph node dissection in August of 2011 for a ypT1b yTN3 (14 positive lymph nodes), Stage IIIC, grade 1 residual tumor. HER-2/neu was repeated, again not amplified.   (4) Completed loco-regional radiation in late October of 2011   (5) on letrozole starting early November 2011, continued to October 2016, with progression  METASTATIC DISEASE: September 2016: Involving left adrenal gland, regional nodes and bones  (6)  biopsy of a 7.5 cm left adrenal mass 05/30/2015 showed metastatic carcinoma, estrogen, progesterone, and HER-2 negative, with an MIB-1 of 90-100%; the tumor cells were cytokeratin 7 positive, cytokeratin 20 focally positive, gross cystic disease fluid protein negative  (a) the CA-27-29 is informative  (b) PET scan 06/17/2015 shows, in addition to the left adrenal mass, some periaortic adenopathy and multiple hypermetabolic bone metastases (L2, T9, T2 and  others)  (c) EGD on 06/16/2015 to evaluate suspicious cardiac wall thickening showed no evidence of malignancy  (7) capecitabine started 07/04/2015 at 1.5 g BID 7/7. Discontinued 08/29/15 with progression  (8) denosumab/ Xgeva started 07/18/2015, repeated monthly  (9) eribulin D1/D8 q 21 days started 09/23/14. 1 complete cycle given before hospitalization. Discontinued due to elevation in LFTs.   (10) hospitalized for infuenza A on 09/26/15. Pulmonary embolus found. Lovenox started daily.  (11) hepatic encephalopathy during hospital stay in January 2017. Lactulose completed. Awaiting approval of rifaximin due to financial strain.   (12) carboplatin d1/d8 every 21 days started 11/07/2015  OTHER PROBLEMS: (a) the patient is status post hemorrhagic CVA January 2016  (b) congenital absence of left kidney  PLAN:  Glora is concerned about the return of her left flank pain. Scans will be performed in 2 weeks. It was comforting that we could see the tumor markers have been trending down for some time now. The labs were reviewed in detail and her liver enzymes continue to improve slowly. She will proceed with day 1, cycle 5 of reduced dose carboplatin as planned.   We discussed methods to managing her constipation. She will increase her sennakot use to 2 tablets twice daily with miralax daily as a minimum. She also has left over lactulose which always helps her move her bowels.   Jacob will return in 1 week for day 8 of treatment. She understands and agree with this plan. She knows the goal of treatment in her case is control. She has been encouraged to call with any issues that might arise before her next visit here.    Laurie Panda, NP 12/19/2015 12:10 PM

## 2015-12-19 NOTE — Patient Instructions (Signed)

## 2015-12-19 NOTE — Patient Instructions (Addendum)
[facility] Discharge Instructions for Patients Receiving Chemotherapy  Today you received the following chemotherapy agents Carboplatin. To help prevent nausea and vomiting after your treatment, we encourage you to take your nausea medication as directed.  If you develop nausea and vomiting that is not controlled by your nausea medication, call the clinic.   BELOW ARE SYMPTOMS THAT SHOULD BE REPORTED IMMEDIATELY:  *FEVER GREATER THAN 100.5 F  *CHILLS WITH OR WITHOUT FEVER  NAUSEA AND VOMITING THAT IS NOT CONTROLLED WITH YOUR NAUSEA MEDICATION  *UNUSUAL SHORTNESS OF BREATH  *UNUSUAL BRUISING OR BLEEDING  TENDERNESS IN MOUTH AND THROAT WITH OR WITHOUT PRESENCE OF ULCERS  *URINARY PROBLEMS  *BOWEL PROBLEMS  UNUSUAL RASH Items with * indicate a potential emergency and should be followed up as soon as possible.  Feel free to call the clinic you have any questions or concerns. The clinic phone number is [phone].  Please show the [facility] at check-in to the Emergency Department and triage nurse.   Denosumab injection What is this medicine? DENOSUMAB (den oh sue mab) slows bone breakdown. Prolia is used to treat osteoporosis in women after menopause and in men. Delton See is used to prevent bone fractures and other bone problems caused by cancer bone metastases. Delton See is also used to treat giant cell tumor of the bone. This medicine may be used for other purposes; ask your health care provider or pharmacist if you have questions. What should I tell my health care provider before I take this medicine? They need to know if you have any of these conditions: -dental disease -eczema -infection or history of infections -kidney disease or on dialysis -low blood calcium or vitamin D -malabsorption syndrome -scheduled to have surgery or tooth extraction -taking medicine that contains denosumab -thyroid or parathyroid disease -an unusual reaction to denosumab,  other medicines, foods, dyes, or preservatives -pregnant or trying to get pregnant -breast-feeding How should I use this medicine? This medicine is for injection under the skin. It is given by a health care professional in a hospital or clinic setting. If you are getting Prolia, a special MedGuide will be given to you by the pharmacist with each prescription and refill. Be sure to read this information carefully each time. For Prolia, talk to your pediatrician regarding the use of this medicine in children. Special care may be needed. For Delton See, talk to your pediatrician regarding the use of this medicine in children. While this drug may be prescribed for children as young as 13 years for selected conditions, precautions do apply. Overdosage: If you think you have taken too much of this medicine contact a poison control center or emergency room at once. NOTE: This medicine is only for you. Do not share this medicine with others. What if I miss a dose? It is important not to miss your dose. Call your doctor or health care professional if you are unable to keep an appointment. What may interact with this medicine? Do not take this medicine with any of the following medications: -other medicines containing denosumab This medicine may also interact with the following medications: -medicines that suppress the immune system -medicines that treat cancer -steroid medicines like prednisone or cortisone This list may not describe all possible interactions. Give your health care provider a list of all the medicines, herbs, non-prescription drugs, or dietary supplements you use. Also tell them if you smoke, drink alcohol, or use illegal drugs. Some items may interact with your medicine. What should I watch  for while using this medicine? Visit your doctor or health care professional for regular checks on your progress. Your doctor or health care professional may order blood tests and other tests to see how  you are doing. Call your doctor or health care professional if you get a cold or other infection while receiving this medicine. Do not treat yourself. This medicine may decrease your body's ability to fight infection. You should make sure you get enough calcium and vitamin D while you are taking this medicine, unless your doctor tells you not to. Discuss the foods you eat and the vitamins you take with your health care professional. See your dentist regularly. Brush and floss your teeth as directed. Before you have any dental work done, tell your dentist you are receiving this medicine. Do not become pregnant while taking this medicine or for 5 months after stopping it. Women should inform their doctor if they wish to become pregnant or think they might be pregnant. There is a potential for serious side effects to an unborn child. Talk to your health care professional or pharmacist for more information. What side effects may I notice from receiving this medicine? Side effects that you should report to your doctor or health care professional as soon as possible: -allergic reactions like skin rash, itching or hives, swelling of the face, lips, or tongue -breathing problems -chest pain -fast, irregular heartbeat -feeling faint or lightheaded, falls -fever, chills, or any other sign of infection -muscle spasms, tightening, or twitches -numbness or tingling -skin blisters or bumps, or is dry, peels, or red -slow healing or unexplained pain in the mouth or jaw -unusual bleeding or bruising Side effects that usually do not require medical attention (Report these to your doctor or health care professional if they continue or are bothersome.): -muscle pain -stomach upset, gas This list may not describe all possible side effects. Call your doctor for medical advice about side effects. You may report side effects to FDA at 1-800-FDA-1088. Where should I keep my medicine? This medicine is only given in a  clinic, doctor's office, or other health care setting and will not be stored at home. NOTE: This sheet is a summary. It may not cover all possible information. If you have questions about this medicine, talk to your doctor, pharmacist, or health care provider.    2016, Elsevier/Gold Standard. (2012-02-25 12:37:47)

## 2015-12-26 ENCOUNTER — Encounter: Payer: Self-pay | Admitting: Nurse Practitioner

## 2015-12-26 ENCOUNTER — Other Ambulatory Visit (HOSPITAL_BASED_OUTPATIENT_CLINIC_OR_DEPARTMENT_OTHER): Payer: Medicare Other

## 2015-12-26 ENCOUNTER — Ambulatory Visit (HOSPITAL_BASED_OUTPATIENT_CLINIC_OR_DEPARTMENT_OTHER): Payer: Medicare Other | Admitting: Nurse Practitioner

## 2015-12-26 ENCOUNTER — Other Ambulatory Visit: Payer: Self-pay | Admitting: *Deleted

## 2015-12-26 ENCOUNTER — Ambulatory Visit (HOSPITAL_BASED_OUTPATIENT_CLINIC_OR_DEPARTMENT_OTHER): Payer: Medicare Other

## 2015-12-26 ENCOUNTER — Telehealth: Payer: Self-pay | Admitting: Nurse Practitioner

## 2015-12-26 ENCOUNTER — Ambulatory Visit: Payer: Medicare Other

## 2015-12-26 VITALS — BP 96/55 | HR 55 | Temp 98.4°F | Resp 18 | Ht 62.5 in | Wt 169.7 lb

## 2015-12-26 DIAGNOSIS — C7972 Secondary malignant neoplasm of left adrenal gland: Secondary | ICD-10-CM | POA: Diagnosis not present

## 2015-12-26 DIAGNOSIS — C50912 Malignant neoplasm of unspecified site of left female breast: Secondary | ICD-10-CM

## 2015-12-26 DIAGNOSIS — C773 Secondary and unspecified malignant neoplasm of axilla and upper limb lymph nodes: Secondary | ICD-10-CM | POA: Diagnosis not present

## 2015-12-26 DIAGNOSIS — Z452 Encounter for adjustment and management of vascular access device: Secondary | ICD-10-CM | POA: Diagnosis present

## 2015-12-26 DIAGNOSIS — R109 Unspecified abdominal pain: Principal | ICD-10-CM

## 2015-12-26 DIAGNOSIS — C50412 Malignant neoplasm of upper-outer quadrant of left female breast: Secondary | ICD-10-CM

## 2015-12-26 DIAGNOSIS — E871 Hypo-osmolality and hyponatremia: Secondary | ICD-10-CM | POA: Diagnosis not present

## 2015-12-26 DIAGNOSIS — G8929 Other chronic pain: Secondary | ICD-10-CM

## 2015-12-26 DIAGNOSIS — Z170 Estrogen receptor positive status [ER+]: Secondary | ICD-10-CM

## 2015-12-26 DIAGNOSIS — R7989 Other specified abnormal findings of blood chemistry: Secondary | ICD-10-CM

## 2015-12-26 DIAGNOSIS — I2609 Other pulmonary embolism with acute cor pulmonale: Secondary | ICD-10-CM

## 2015-12-26 DIAGNOSIS — C7951 Secondary malignant neoplasm of bone: Secondary | ICD-10-CM | POA: Diagnosis not present

## 2015-12-26 DIAGNOSIS — Z95828 Presence of other vascular implants and grafts: Secondary | ICD-10-CM

## 2015-12-26 LAB — CBC WITH DIFFERENTIAL/PLATELET
BASO%: 1 % (ref 0.0–2.0)
Basophils Absolute: 0 10*3/uL (ref 0.0–0.1)
EOS ABS: 0.1 10*3/uL (ref 0.0–0.5)
EOS%: 2.7 % (ref 0.0–7.0)
HEMATOCRIT: 39.6 % (ref 34.8–46.6)
HEMOGLOBIN: 14 g/dL (ref 11.6–15.9)
LYMPH#: 1.2 10*3/uL (ref 0.9–3.3)
LYMPH%: 23.5 % (ref 14.0–49.7)
MCH: 35.8 pg — ABNORMAL HIGH (ref 25.1–34.0)
MCHC: 35.4 g/dL (ref 31.5–36.0)
MCV: 101.1 fL — AB (ref 79.5–101.0)
MONO#: 0.8 10*3/uL (ref 0.1–0.9)
MONO%: 16.7 % — ABNORMAL HIGH (ref 0.0–14.0)
NEUT%: 56.1 % (ref 38.4–76.8)
NEUTROS ABS: 2.8 10*3/uL (ref 1.5–6.5)
Platelets: 160 10*3/uL (ref 145–400)
RBC: 3.92 10*6/uL (ref 3.70–5.45)
RDW: 13.3 % (ref 11.2–14.5)
WBC: 5 10*3/uL (ref 3.9–10.3)

## 2015-12-26 LAB — COMPREHENSIVE METABOLIC PANEL
ALBUMIN: 3.2 g/dL — AB (ref 3.5–5.0)
ALK PHOS: 262 U/L — AB (ref 40–150)
ALT: 29 U/L (ref 0–55)
AST: 41 U/L — ABNORMAL HIGH (ref 5–34)
Anion Gap: 9 mEq/L (ref 3–11)
BILIRUBIN TOTAL: 1.13 mg/dL (ref 0.20–1.20)
BUN: 21.9 mg/dL (ref 7.0–26.0)
CALCIUM: 8.7 mg/dL (ref 8.4–10.4)
CO2: 26 mEq/L (ref 22–29)
Chloride: 92 mEq/L — ABNORMAL LOW (ref 98–109)
Creatinine: 1.3 mg/dL — ABNORMAL HIGH (ref 0.6–1.1)
EGFR: 44 mL/min/{1.73_m2} — AB (ref >90)
GLUCOSE: 95 mg/dL (ref 70–140)
Potassium: 3.8 mEq/L (ref 3.5–5.1)
SODIUM: 128 meq/L — AB (ref 136–145)
TOTAL PROTEIN: 6.8 g/dL (ref 6.4–8.3)

## 2015-12-26 MED ORDER — SODIUM CHLORIDE 0.9% FLUSH
10.0000 mL | INTRAVENOUS | Status: DC | PRN
  Administered 2015-12-26: 10 mL via INTRAVENOUS
  Filled 2015-12-26: qty 10

## 2015-12-26 MED ORDER — SODIUM CHLORIDE 0.9 % IV SOLN: Freq: Once | INTRAVENOUS | Status: DC

## 2015-12-26 MED ORDER — ENOXAPARIN SODIUM 40 MG/0.4ML ~~LOC~~ SOLN: 40.0000 mg | SUBCUTANEOUS | Status: DC

## 2015-12-26 MED ORDER — OXYCODONE HCL 5 MG PO TABS: 5.0000 mg | ORAL_TABLET | ORAL | Status: DC | PRN

## 2015-12-26 NOTE — Telephone Encounter (Signed)
appt made and avs printed °

## 2015-12-26 NOTE — Patient Instructions (Signed)

## 2015-12-26 NOTE — Progress Notes (Signed)
. ID: Maria Pruitt   DOB: September 22, 1945  MR#: [id number]  HRC#:[id number]  [name], PA-C GYN:  SU:  OTHER MD:  CHIEF COMPLAINT: left adrenal metastatsis  CURRENT TREATMENT: denosumab, carboplatin  BREAST CANCER HISTORY:  from the original intake note:  Maria Pruitt herself palpated a change in her left breast, shortly before her mammogram was due.  She brought this to [name] attention and he set her up for diagnostic mammography on October 31, 2009.   Routine and implant displaced views of both breasts showed a 2 cm irregular mass in the outer left breast, seen only on the spot tangential view.  The breast tissue itself was fatty.  There were no suspicious calcifications.  This mass was palpable to Dr. [name].  An ultrasound showed it to be 1.7 cm irregular and hypoechoic.  There were at least two enlarged left axillary lymph nodes identified.  Dr. [name] recommended biopsy which was performed the same day and showed ([id number]) and invasive breast cancer felt most likely to be ductal with lobular features.  The left axillary lymph node biopsy also showed the same tumor (similar morphologic findings) and both prognostic profiles showed the cancer to be strongly ER and PR positive with a low to borderline proliferation fraction (13 and 14%).  Neither mass showed amplification of Her-2 by CISH.  (The ratios were 1.1 and 0.85.)    With this information, the patient was referred to Dr. [name] and bilateral breast MRIs were obtained March 1st.  This showed several left axillary lymph nodes with cortical thickening, the largest one measuring 1.1 cm.  The mass itself measured 2.9 cm by MRI.  It was spiculated and irregular.  There was no evidence of contralateral disease.   She was treated neoadjuvantly with docetaxel and cyclophosphamide for 6 cycles completed in July 2011, after which she underwent left lumpectomy and axillary dissection August 2011 for a ypT1b yTN3 (14 positive lymph nodes),  Stage IIIC, grade 1 residual tumor. HER-2/neu was repeated, again not amplified. After completing local regional radiation October 2011 she started letrozole November 2011, continued to September 2016, which she was found to have metastatic disease   METASTATIC DISEASE HISTORY: From the 06/08/2015 summary:   [name] returns today for a new problem accompanied by her sister [name]. To summarize the recent history: She had a hemorrhagic stroke in January 2016 with some residuals. She [place].--In August she presented to her primary care physician, Dr. Thornton Papas, with a complaint of abdominal discomfort, nausea and vomiting. He treated her for a UTI w/o resolution and obtained a KUB in his office; this was nondiagnostic. Accordingly he set up the patient for CT scan of the abdomen and pelvis 05/06/2015. This showed the left adrenal gland to be enlarged to 7.5 x 4.0 cm. There was eccentric wall thickening of the gastric cardia. There were small lymph nodes in the upper abdomen and retroperitoneum. There was no liver involvement. The right adrenal gland was unremarkable. There was severe left kidney atrophy (congenital).  The patient was then set up for biopsy of the left adrenal mass on 05/30/2015. This showed Maria Pruitt [id number]) metastatic carcinoma which was cytokeratin 7 positive, with rare cytokeratin 20 positivity and was negative for gross cystic disease fluid protein. The prognostic panel showed this tumor to be estrogen and progesterone receptor negative, HER-2 negative, with an MIB-1 of 90-100%.   PET scan10/03/2015 which showed in additional to the  left adrenal mass, some regional lymph nodes and multiple bone lesions. There was no obvious primary tumor. She also had an EGD to evaluate a possible gastric primary suggested by the yearly a CT scan of the chest. This was normal. We also obtained tumor markerswhich showed a normal CA 19,a mildly  elevated CEA at 9.7,[name] is significantly elevated CA-27-29 at 125. Given this data, the most likely interpretation is that we are dealing with an estrogen receptor negative recurrence of her earlier estrogen receptor positive breast cancer, now stage IV  Her subsequent history is as detailed below  INTERVAL HISTORY: Devin returns today for follow-up of her metastatic breast cancer, accompanied by her sister. Today is day 8, cycle 3 of dose reduced carboplatin.   REVIEW OF SYSTEMS: [name] continues to have right flank that migrates to her lower back. She takes 5 mg oxycodone, but sparingly. Unfortunately her pain is up to 5/10 and is a dull ache now. It can be as bad as a 7/10 and sharp, or nothing at all for several house. She is afraid taking more oxycodone will cause her to be constipated. She is having normal bowel movements so far, on 2 sennakot tablets BID. The pain causes mild nausea. Her appetite is down somewhat. She denies fevers or chills. She has no mouth sores or rashes. Her energy level is decent. She denise shortness of breath, chest pain, cough, or palpitations. A detailed review of systems is otherwise stable.  PAST MEDICAL HISTORY: Past Medical History  Diagnosis Date  . Hypertension   . Breast cancer ([place]) 2011    Stage 3  . Atrial fibrillation ([place])   . Hypercholesterolemia   . Abnormal ECG   . Allergy     seasonal  . Arthritis   . GERD (gastroesophageal reflux disease)   . 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 Seizures ([place])   . Stroke ([place])     dec 15  . Thyroid disease   . Adrenal cancer ([place])   . Fatty liver   . Influenza A    1. Significant for pituitary adenoma which was removed in 1983 but recurred, requiring radiation and briefly some Parlodel.  She has been off treatment since 1985.  She has some acromegaly secondary to this tumor.   2. She is status post bilateral submuscular breast implants under Dr. Towanda Malkin in 1987.   3. She  is status post C-section for her third child who was hydrocephalic.   4. She underwent rhinoplasty in 1984.   5. She has a history of hypertension. 6. History of mitral valve prolapse, but she says she has not been receiving antibiotics prior to dental or similar procedures.   7. History of hypothyroidism.   8. History of congenital absence of one kidney.   9. History of hyperlipidemia.  10. History of obesity.   11. History of renal stones.   12. History of fatty liver.   13. History of gout.   14. History of palpitations.   History of childhood asthma  PAST SURGICAL HISTORY: Past Surgical History  Procedure Laterality Date  . Cesarean section  1980  . Breast enhancement surgery  1987  . Umbilical hernia repair    . Tubal ligation    . Scar revision    . Pituitary surgery      adenoma  . Rhinoplasty  1985  . Breast lumpectomy Left   . Colonoscopy      FAMILY HISTORY  Family History  Problem Relation Age of Onset  . Heart disease Mother   . Heart attack Father   . Heart attack Brother   . Colon cancer Neg Hx    The patient's father died from a myocardial infarction at the age of 70.  The patient's mother died from complications of congestive heart failure at the age of 53.  The patient had one brother who died from a myocardial infarction at the age of 57.  One of the brothers and two sisters are alive and well.  Sr. [name] is an ICU nurse. There is no history of breast or ovarian cancer in the family to her knowledge.  GYNECOLOGIC HISTORY: She is GX P3, first pregnancy to term at age 81.  She went through the change of life at the time of her pituitary surgery in 1983.  She took hormone replacement for less than a year because of poor tolerance.    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  She gave up her job in 08-07-10.  Her first husband died from chronic myeloid leukemia in 1985.  Her  three children from that marriage include her son Randall Hiss who died from complications of hydrocephaly at age 45; son [name] who is a Insurance underwriter and son [name] who is an Public house manager in this area.  The patient has five grandchildren.  Her second husband of 20+ years, Ron, is a Company secretary of an independent [employer] and also does Writer and is a Oceanographer. He is now disabled due toa traumatic brain injury. He lost his first wife to endometrial cancer.  He has a daughter from that marriage.  She lives in [place]: not in place  HEALTH MAINTENANCE: Social History  Substance Use Topics  . Smoking status: Never Smoker   . Smokeless tobacco: Never Used  . Alcohol Use: No     Colonoscopy:  PAP:  Bone density: January 2012/ osteopenia  Lipid panel:  Allergies  Allergen Reactions  . Ciprofloxacin Other (See Comments)  . Clonidine Derivatives Other (See Comments)  . Phenytoin     Other reaction(s): Other Elevated LFTs  . Sulfa Antibiotics Hives  . Benzonatate     REACTION: Swelling, tessalon perles; tolerates benadryl  . Codeine     REACTION: Nausea    Current Outpatient Prescriptions  Medication Sig Dispense Refill  . Ascorbic Acid (VITAMIN C) 100 MG tablet Take 1,000 mg by mouth daily.     . calcium carbonate (TUMS - DOSED IN MG ELEMENTAL CALCIUM) 500 MG chewable tablet Chew 1 tablet (200 mg of elemental calcium total) by mouth 3 (three) times daily. 30 tablet 0  . famotidine (PEPCID) 20 MG tablet Take 1 tablet (20 mg total) by mouth 2 (two) times daily. 180 tablet 4  . hydrochlorothiazide (HYDRODIURIL) 25 MG tablet Take 1 tablet (25 mg total) by mouth daily.    Marland Kitchen levothyroxine (SYNTHROID, LEVOTHROID) 100 MCG tablet Take 88 mcg by mouth daily before breakfast.    . loratadine (CLARITIN) 10 MG tablet Take 10 mg by mouth daily.    Marland Kitchen losartan (COZAAR) 50 MG tablet Take 50 mg by mouth daily.    . metoCLOPramide (REGLAN) 5 MG tablet Take 1 tablet (5 mg total) by  mouth 3 (three) times daily as needed for nausea. 30 tablet 1  . metoprolol (LOPRESSOR) 50 MG tablet Take 50 mg by mouth 2 (two)  times daily.    Marland Kitchen aspirin (ASPIRIN CHILDRENS) 81 MG chewable tablet Chew 1 tablet (81 mg total) by mouth daily. (Patient not taking: Reported on 12/05/2015) 100 tablet 4  . enoxaparin (LOVENOX) 40 MG/0.4ML injection Inject 0.4 mLs (40 mg total) into the skin daily. 30 Syringe 1  . lactulose (CHRONULAC) 10 GM/15ML solution Take 45 mLs (30 g total) by mouth 2 (two) times daily. (Patient not taking: Reported on 12/26/2015) 240 mL 1  . oxyCODONE (OXY IR/ROXICODONE) 5 MG immediate release tablet Take 1 tablet (5 mg total) by mouth every 4 (four) hours as needed for severe pain. 30 tablet 0   Current Facility-Administered Medications  Medication Dose Route Frequency Provider Last Rate Last Dose  . 0.9 %  sodium chloride infusion   Intravenous Once Laurie Panda, NP       Facility-Administered Medications Ordered in Other Visits  Medication Dose Route Frequency Provider Last Rate Last Dose  . 0.9 %  sodium chloride infusion   Intravenous Once Maria Cruel, MD        OBJECTIVE: Middle-aged white woman in no acute distress Filed Vitals:   12/26/15 1118  BP: 96/55  Pulse: 55  Temp: 98.4 F (36.9 C)  Resp: 18     Body mass index is 30.52 kg/(m^2).    ECOG FS: 1  Skin: warm, dry  HEENT: sclerae anicteric, conjunctivae pink, oropharynx clear. No thrush or mucositis.  Lymph Nodes: No cervical or supraclavicular lymphadenopathy  Lungs: clear to auscultation bilaterally, no rales, wheezes, or rhonci  Heart: regular rate and rhythm  Abdomen: round, soft, non tender, positive bowel sounds  Musculoskeletal: No focal spinal tenderness, no peripheral edema  Neuro: non focal, well oriented, positive affect  Breasts: deferred  LAB RESULTS: CBC Latest Ref Rng 12/26/2015 12/19/2015 12/05/2015  WBC 3.9 - 10.3 10e3/uL 5.0 4.3 4.5  Hemoglobin 11.6 - 15.9 g/dL 14.0 13.9 13.2   Hematocrit 34.8 - 46.6 % 39.6 39.9 38.3  Platelets 145 - 400 10e3/uL 160 147 142(L)     CMP Latest Ref Rng 12/26/2015 12/19/2015 12/05/2015  Glucose 70 - 140 mg/dl 95 102 125  BUN 7.0 - 26.0 mg/dL 21.9 10.5 15.0  Creatinine 0.6 - 1.1 mg/dL 1.3(H) 0.7 0.7  Sodium 136 - 145 mEq/L 128(L) 132(L) 137  Potassium 3.5 - 5.1 mEq/L 3.8 3.9 3.7  CO2 22 - 29 mEq/L _0 Calcium 8.4 - 10.4 mg/dL 8.7 8.8 8.8  Total Protein 6.4 - 8.3 g/dL 6.8 6.8 6.6  Total Bilirubin 0.20 - 1.20 mg/dL 1.13 1.02 1.07  Alkaline Phos 40 - 150 U/L 262(H) 272(H) 288(H)  AST 5 - 34 U/L 41(H) 42(H) 62(H)  ALT 0 - 55 U/L 29 28 43    STUDIES: No results found.  ASSESSMENT: 70 y.o. [place], [place] woman  (1) status post left breast biopsy in February of 2011 for an invasive lobular carcinoma measuring 2.9 cm by MRI, with a positive prechemotherapy axillary lymph node biopsy, strongly estrogen and progesterone receptor positive with no evidence of HER-2/neu amplification and an MIB-1 of 14%.   (2) received 6 cycles of docetaxel/ cyclophosphamide in the neoadjuvant setting, completed in mid July of 2011   (3) s/p left lumpectomy and axillary lymph node dissection in August of 2011 for a ypT1b yTN3 (14 positive lymph nodes), Stage IIIC, grade 1 residual tumor. HER-2/neu was repeated, again not amplified.   (4) Completed loco-regional radiation in late October of 2011   (5) on letrozole  starting early November 2011, continued to October 2016, with progression  METASTATIC DISEASE: September 2016: Involving left adrenal gland, regional nodes and bones  (6) biopsy of a 7.5 cm left adrenal mass 05/30/2015 showed metastatic carcinoma, estrogen, progesterone, and HER-2 negative, with an MIB-1 of 90-100%; the tumor cells were cytokeratin 7 positive, cytokeratin 20 focally positive, gross cystic disease fluid protein negative  (a) the CA-27-29 is informative  (b) PET scan 06/17/2015 shows, in addition to the left  adrenal mass, some periaortic adenopathy and multiple hypermetabolic bone metastases (L2, T9, T2 and others)  (c) EGD on 06/16/2015 to evaluate suspicious cardiac wall thickening showed no evidence of malignancy  (7) capecitabine started 07/04/2015 at 1.5 g BID 7/7. Discontinued 08/29/15 with progression  (8) denosumab/ Xgeva started 07/18/2015, repeated monthly  (9) eribulin D1/D8 q 21 days started 09/23/14. 1 complete cycle given before hospitalization. Discontinued due to elevation in LFTs.   (10) hospitalized for infuenza A on 09/26/15. Pulmonary embolus found. Lovenox started daily.  (11) hepatic encephalopathy during hospital stay in January 2017. Lactulose completed. Awaiting approval of rifaximin due to financial strain.   (12) carboplatin d1/d8 every 21 days started 11/07/2015  OTHER PROBLEMS: (a) the patient is status post hemorrhagic CVA January 2016  (b) congenital absence of left kidney  PLAN:  The labs were reviewed in detail, and it seems Mashanda's creatinine has almost doubled from 0.7 last week to 1.3 today. She is hyponatremic with a sodium level of 128. I consulted with Dr. Jana Hakim. He suggests holding day 8 treatment today, and proceeding with 1L NS instead. She will also increase her water intake at home.   I have encouraged her to take the oxycodone more regularly, to get ahead of the pain instead of reacting to it. She knows to add miralax to her current stool softener use daily.   Ghalia is scheduled for a repeat chest CT next Monday. She will return on 5/1 to review these results with Dr. Jana Hakim. He will also be deciding if she should continue on lovenox at this time. She understands and agrees with this plan. She knows the goal of treatment in her case is control. She has been encouraged to call with any issues that might arise before her next visit here.   Laurie Panda, NP 12/26/2015 12:19 PM

## 2016-01-02 ENCOUNTER — Telehealth: Payer: Self-pay | Admitting: *Deleted

## 2016-01-02 ENCOUNTER — Ambulatory Visit (HOSPITAL_COMMUNITY)
Admission: RE | Admit: 2016-01-02 | Discharge: 2016-01-02 | Disposition: A | Discharge status: Home / Self Care | Payer: Medicare Other | Source: Ambulatory Visit | Attending: Nurse Practitioner | Admitting: Nurse Practitioner

## 2016-01-02 ENCOUNTER — Encounter (HOSPITAL_COMMUNITY): Payer: Self-pay

## 2016-01-02 DIAGNOSIS — K7460 Unspecified cirrhosis of liver: Secondary | ICD-10-CM | POA: Insufficient documentation

## 2016-01-02 DIAGNOSIS — C7972 Secondary malignant neoplasm of left adrenal gland: Secondary | ICD-10-CM | POA: Insufficient documentation

## 2016-01-02 DIAGNOSIS — K8020 Calculus of gallbladder without cholecystitis without obstruction: Secondary | ICD-10-CM | POA: Diagnosis not present

## 2016-01-02 DIAGNOSIS — R590 Localized enlarged lymph nodes: Secondary | ICD-10-CM | POA: Diagnosis not present

## 2016-01-02 DIAGNOSIS — I2510 Atherosclerotic heart disease of native coronary artery without angina pectoris: Secondary | ICD-10-CM | POA: Insufficient documentation

## 2016-01-02 DIAGNOSIS — I96 Gangrene, not elsewhere classified: Secondary | ICD-10-CM | POA: Insufficient documentation

## 2016-01-02 DIAGNOSIS — C7951 Secondary malignant neoplasm of bone: Secondary | ICD-10-CM | POA: Diagnosis not present

## 2016-01-02 DIAGNOSIS — C50412 Malignant neoplasm of upper-outer quadrant of left female breast: Secondary | ICD-10-CM

## 2016-01-02 MED ORDER — IOPAMIDOL (ISOVUE-300) INJECTION 61%
75.0000 mL | Freq: Once | INTRAVENOUS | Status: AC | PRN
  Administered 2016-01-02: 50 mL via INTRAVENOUS

## 2016-01-02 MED FILL — oxyCODONE HCL 5 MG TABS: 5 | 5 days supply | Qty: 30 | Fill #0

## 2016-01-02 MED FILL — ENOXAPARIN 40 MG/0.4 ML SYR: 40 | 7 days supply | Qty: 3 | Fill #0

## 2016-01-02 NOTE — Telephone Encounter (Signed)
"  We're calling today to make sure today is a CT Scan or should it be an MRI?  Also does she have any other appointments today."  Advised the CT chest with contrast is what was ordered.  No further appointments today.  Next scheduled f/u is Jan 09, 2016 beginning at 2:45 pm.  No further questions.

## 2016-01-06 ENCOUNTER — Other Ambulatory Visit: Payer: Self-pay

## 2016-01-06 DIAGNOSIS — Z95828 Presence of other vascular implants and grafts: Secondary | ICD-10-CM | POA: Insufficient documentation

## 2016-01-08 NOTE — Progress Notes (Signed)
. ID: Maria Pruitt   DOB: 14-Jan-1946  MR#: [id number]  TIR#:[id number]  Maria Nova, PA-C GYN:  SU:  OTHER MD:  CHIEF COMPLAINT: left adrenal metastatsis  CURRENT TREATMENT: denosumab, carboplatin  BREAST CANCER HISTORY:  from the original intake note:  Maria Pruitt herself palpated a change in her left breast, shortly before her mammogram was due.  She brought this to [name] attention and he set her up for diagnostic mammography on October 31, 2009.   Routine and implant displaced views of both breasts showed a 2 cm irregular mass in the outer left breast, seen only on the spot tangential view.  The breast tissue itself was fatty.  There were no suspicious calcifications.  This mass was palpable to Dr. [name].  An ultrasound showed it to be 1.7 cm irregular and hypoechoic.  There were at least two enlarged left axillary lymph nodes identified.  Dr. [name] recommended biopsy which was performed the same day and showed ([id number]) and invasive breast cancer felt most likely to be ductal with lobular features.  The left axillary lymph node biopsy also showed the same tumor (similar morphologic findings) and both prognostic profiles showed the cancer to be strongly ER and PR positive with a low to borderline proliferation fraction (13 and 14%).  Neither mass showed amplification of Her-2 by CISH.  (The ratios were 1.1 and 0.85.)    With this information, the patient was referred to Dr. [name] and bilateral breast MRIs were obtained March 1st.  This showed several left axillary lymph nodes with cortical thickening, the largest one measuring 1.1 cm.  The mass itself measured 2.9 cm by MRI.  It was spiculated and irregular.  There was no evidence of contralateral disease.   She was treated neoadjuvantly with docetaxel and cyclophosphamide for 6 cycles completed in July 2011, after which she underwent left lumpectomy and axillary dissection August 2011 for a ypT1b yTN3 (14 positive lymph nodes),  Stage IIIC, grade 1 residual tumor. HER-2/neu was repeated, again not amplified. After completing local regional radiation October 2011 she started letrozole November 2011, continued to September 2016, which she was found to have metastatic disease   METASTATIC DISEASE HISTORY: From the 06/08/2015 summary:   Maria Pruitt returns today for a new problem accompanied by her sister Maria Pruitt. To summarize the recent history: She had a hemorrhagic stroke in January 2016 with some residuals. She has been living next door to her son in the [place] area since then.--In August she presented to her primary care physician, Dr. Thornton Papas, with a complaint of abdominal discomfort, nausea and vomiting. He treated her for a UTI w/o resolution and obtained a KUB in his office; this was nondiagnostic. Accordingly he set up the patient for CT scan of the abdomen and pelvis 05/06/2015. This showed the left adrenal gland to be enlarged to 7.5 x 4.0 cm. There was eccentric wall thickening of the gastric cardia. There were small lymph nodes in the upper abdomen and retroperitoneum. There was no liver involvement. The right adrenal gland was unremarkable. There was severe left kidney atrophy (congenital).  The patient was then set up for biopsy of the left adrenal mass on 05/30/2015. This showed Maria Pruitt [id number]) metastatic carcinoma which was cytokeratin 7 positive, with rare cytokeratin 20 positivity and was negative for gross cystic disease fluid protein. The prognostic panel showed this tumor to be estrogen and progesterone receptor negative, HER-2 negative, with an MIB-1 of 90-100%.   PET scan10/03/2015 which showed in additional to the  left adrenal mass, some regional lymph nodes and multiple bone lesions. There was no obvious primary tumor. She also had an EGD to evaluate a possible gastric primary suggested by the yearly a CT scan of the chest. This was normal. We also obtained tumor markerswhich showed a normal CA 19,a mildly  elevated CEA at 9.7,Maria Pruitt is significantly elevated CA-27-29 at 125. Given this data, the most likely interpretation is that we are dealing with an estrogen receptor negative recurrence of her earlier estrogen receptor positive breast cancer, now stage IV  Her subsequent history is as detailed below  INTERVAL HISTORY: Maria Pruitt returns today for follow-up of her triple negative breast cancer metastatic to the adrenal gland, accompanied by her daughter-in-law. Today is day 1 cycle 4 of carboplatin. She has received 3 previous cycles of carboplatin, given days 1 and 8 of each 21 day cycle, with the day 8 of cycle 3 held because of a bump in her creatinine.   A CT scan of the chest 01/02/2016 shows improvement in her measurable disease (left adrenal gland and retroperitoneal lymphadenopathy). The bony disease was stable. She is here today to discuss those results and to consider whether we should treat her every 2 weeks instead of day 1 and day 8. The secondary question was whether to continue Lovenox beyond this point  REVIEW OF SYSTEMS: Maria Pruitt is very reluctant to take pain medication but she tells me she does take it at least twice daily. She takes it up to 4 times a day on  " bad" days. This does constipate her. She tells me MiraLAX did not work for her. She is using fiber with minimal results. She finally took some lactulose and that did point clear things out". She wonders if we should go with that. In general though she feels satisfied with the pain control that she achieves and it does allow her to take care of her husband and do her housework.  A detailed review of systems today was otherwise stable  PAST MEDICAL HISTORY: Past Medical History  Diagnosis Date  . Hypertension   . Breast cancer ([place]) 2011    Stage 3  . Atrial fibrillation ([place])   . Hypercholesterolemia   . Abnormal ECG   . Allergy     seasonal  . Arthritis   . GERD (gastroesophageal reflux disease)   . Chronic kidney  disease     only one kidney from birth  . Seizures ([place])   . Stroke ([place])     dec 15  . Thyroid disease   . Adrenal cancer ([place])   . Fatty liver   . Influenza A    1. Significant for pituitary adenoma which was removed in 1983 but recurred, requiring radiation and briefly some Parlodel.  She has been off treatment since 1985.  She has some acromegaly secondary to this tumor.   2. She is status post bilateral submuscular breast implants under Dr. Towanda Malkin in 1987.   3. She is status post C-section for her third child who was hydrocephalic.   4. She underwent rhinoplasty in 1984.   5. She has a history of hypertension. 6. History of mitral valve prolapse, but she says she has not been receiving antibiotics prior to dental or similar procedures.   7. History of hypothyroidism.   8. History of congenital absence of one kidney.   9. History of hyperlipidemia.  10. History of obesity.   11. History of renal stones.   12. History of fatty liver.  13. History of gout.   14. History of palpitations.   History of childhood asthma  PAST SURGICAL HISTORY: Past Surgical History  Procedure Laterality Date  . Cesarean section  1980  . Breast enhancement surgery  1987  . Umbilical hernia repair    . Tubal ligation    . Scar revision    . Pituitary surgery      adenoma  . Rhinoplasty  1985  . Breast lumpectomy Left   . Colonoscopy      FAMILY HISTORY Family History  Problem Relation Age of Onset  . Heart disease Mother   . Heart attack Father   . Heart attack Brother   . Colon cancer Neg Hx    The patient's father died from a myocardial infarction at the age of 32.  The patient's mother died from complications of congestive heart failure at the age of 49.  The patient had one brother who died from a myocardial infarction at the age of 70.  One of the brothers and two sisters are alive and well.  Sr. Maria Pruitt is an ICU nurse. There is no history of breast or ovarian cancer in the  family to her knowledge.  GYNECOLOGIC HISTORY: She is GX P3, first pregnancy to term at age 20.  She went through the change of life at the time of her pituitary surgery in 1983.  She took hormone replacement for less than a year because of poor tolerance.    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  She gave up her job in July 30, 2010.  Her first husband died from chronic myeloid leukemia in 1985.  Her three children from that marriage include her son Randall Hiss who died from complications of hydrocephaly at age 56; son [name] who is a Insurance underwriter and son [name] who is an Public house manager in this area.  The patient has five grandchildren.  Her second husband of 20+ years, Ron, is a Company secretary of an independent [employer] and also does Writer and is a Oceanographer. He is now disabled due toa traumatic brain injury. He lost his first wife to endometrial cancer.  He has a daughter from that marriage.  She lives in [place]: not in place  HEALTH MAINTENANCE: Social History  Substance Use Topics  . Smoking status: Never Smoker   . Smokeless tobacco: Never Used  . Alcohol Use: No     Colonoscopy:  PAP:  Bone density: January 2012/ osteopenia  Lipid panel:  Allergies  Allergen Reactions  . Ciprofloxacin Other (See Comments)  . Clonidine Derivatives Other (See Comments)  . Phenytoin     Other reaction(s): Other Elevated LFTs  . Sulfa Antibiotics Hives  . Benzonatate     REACTION: Swelling, tessalon perles; tolerates benadryl  . Codeine     REACTION: Nausea    Current Outpatient Prescriptions  Medication Sig Dispense Refill  . Ascorbic Acid (VITAMIN C) 100 MG tablet Take 1,000 mg by mouth daily.     Marland Kitchen aspirin (ASPIRIN CHILDRENS) 81 MG chewable tablet Chew 1 tablet (81 mg total) by mouth daily. (Patient not taking: Reported on 12/05/2015) 100 tablet 4  . calcium carbonate (TUMS - DOSED IN MG  ELEMENTAL CALCIUM) 500 MG chewable tablet Chew 1 tablet (200 mg of elemental calcium total) by mouth 3 (three) times daily. 30 tablet 0  . enoxaparin (LOVENOX) 40 MG/0.4ML injection Inject 0.4 mLs (40  mg total) into the skin daily. 30 Syringe 1  . famotidine (PEPCID) 20 MG tablet Take 1 tablet (20 mg total) by mouth 2 (two) times daily. 180 tablet 4  . hydrochlorothiazide (HYDRODIURIL) 25 MG tablet Take 1 tablet (25 mg total) by mouth daily.    Marland Kitchen lactulose (CHRONULAC) 10 GM/15ML solution Take 45 mLs (30 g total) by mouth 2 (two) times daily. (Patient not taking: Reported on 12/26/2015) 240 mL 1  . levothyroxine (SYNTHROID, LEVOTHROID) 100 MCG tablet Take 88 mcg by mouth daily before breakfast.    . loratadine (CLARITIN) 10 MG tablet Take 10 mg by mouth daily.    Marland Kitchen losartan (COZAAR) 50 MG tablet Take 50 mg by mouth daily.    . metoCLOPramide (REGLAN) 5 MG tablet Take 1 tablet (5 mg total) by mouth 3 (three) times daily as needed for nausea. 30 tablet 1  . metoprolol (LOPRESSOR) 50 MG tablet Take 50 mg by mouth 2 (two) times daily.    . ondansetron (ZOFRAN) 8 MG tablet Take 1 tablet (8 mg total) by mouth every 8 (eight) hours as needed for nausea or vomiting. 60 tablet 6  . oxyCODONE (OXY IR/ROXICODONE) 5 MG immediate release tablet Take 1 tablet (5 mg total) by mouth every 4 (four) hours as needed for severe pain. 30 tablet 0  . sorbitol 70 % solution Take 15 mLs by mouth daily as needed. 473 mL 0   No current facility-administered medications for this visit.   Facility-Administered Medications Ordered in Other Visits  Medication Dose Route Frequency Provider Last Rate Last Dose  . 0.9 %  sodium chloride infusion   Intravenous Once Maria Cruel, MD      . CARBOplatin (PARAPLATIN) 140 mg in sodium chloride 0.9 % 100 mL chemo infusion  140 mg Intravenous Once Maria Cruel, MD      . heparin lock flush 100 unit/mL  500 Units Intracatheter Once PRN Maria Cruel, MD      . palonosetron  (ALOXI) injection 0.25 mg  0.25 mg Intravenous Once Maria Cruel, MD      . sodium chloride flush (NS) 0.9 % injection 10 mL  10 mL Intracatheter PRN Maria Cruel, MD        OBJECTIVE: Middle-aged white woman who appears stated age 30 Vitals:   01/09/16 1537  BP: 115/67  Pulse: 59  Temp: 97.5 F (36.4 C)  Resp: 18     Body mass index is 29.88 kg/(m^2).    ECOG FS: 1  Sclerae unicteric, pupils round and equal Oropharynx clear and moist-- no thrush or other lesions No cervical or supraclavicular adenopathy Lungs no rales or rhonchi Heart regular rate and rhythm Abd soft, nontender, positive bowel sounds MSK no focal spinal tenderness, no upper extremity lymphedema Neuro: nonfocal, well oriented, Positive affect Breasts: deferred    LAB RESULTS: Results for JEYDI, KLINGEL (MRN [id number]) as of 01/08/2016 12:29  Ref. Range 11/14/2015 10:14 11/28/2015 08:20 12/05/2015 08:23 12/19/2015 11:16 12/26/2015 10:23  NEUT# Latest Ref Range: 1.5-6.5 10e3/uL 2.4 2.1 2.4 2.3 2.8   CA 27.29 0.0 - 38.6 U/mL 45.2 (H) 84.9 (H)CM 178.1 (H)CM         CBC Latest Ref Rng 01/09/2016 12/26/2015 12/19/2015  WBC 3.9 - 10.3 10e3/uL 4.3 5.0 4.3  Hemoglobin 11.6 - 15.9 g/dL 13.2 14.0 13.9  Hematocrit 34.8 - 46.6 % 37.9 39.6 39.9  Platelets 145 - 400 10e3/uL 159 160 147     CMP Latest Ref  Rng 01/09/2016 12/26/2015 12/19/2015  Glucose 70 - 140 mg/dl 115 95 102  BUN 7.0 - 26.0 mg/dL 7.3 21.9 10.5  Creatinine 0.6 - 1.1 mg/dL 0.9 1.3(H) 0.7  Sodium 136 - 145 mEq/L 132(L) 128(L) 132(L)  Potassium 3.5 - 5.1 mEq/L 4.2 3.8 3.9  CO2 22 - 29 mEq/L _0 Calcium 8.4 - 10.4 mg/dL 9.0 8.7 8.8  Total Protein 6.4 - 8.3 g/dL 6.6 6.8 6.8  Total Bilirubin 0.20 - 1.20 mg/dL 1.25(H) 1.13 1.02  Alkaline Phos 40 - 150 U/L 205(H) 262(H) 272(H)  AST 5 - 34 U/L 24 41(H) 42(H)  ALT 0 - 55 U/L _1 STUDIES: Ct Chest W Contrast  01/02/2016  CLINICAL DATA:  Left breast cancer with hepatic metastases.  Adrenal and osseous metastases. Chemotherapy in progress. Weight loss. EXAM: CT CHEST WITH CONTRAST TECHNIQUE: Multidetector CT imaging of the chest was performed during intravenous contrast administration. CONTRAST:  75m ISOVUE-300 IOPAMIDOL (ISOVUE-300) INJECTION 61% COMPARISON:  MR abdomen 10/02/2015, CT chest 09/26/2015 and PET 06/17/2015. CT chest 12/08/2009. FINDINGS: Mediastinum/Nodes: Right IJ Port-A-Cath terminates in the low SVC. Mediastinal lymph nodes are not enlarged by CT size criteria. No hilar or axillary adenopathy. Surgical clips in left axilla. Three-vessel coronary artery calcification. Heart size within normal limits. No pericardial effusion. Small hiatal hernia. Lungs/Pleura: Patchy ground-glass and mild architectural distortion are again seen in the left upper lobe, possibly treatment related. Streaky atelectasis or scarring in the right middle lobe. Right lower lobe nodule measures 5 mm (series 5, image 103), stable from 09/26/2015. 6 mm subpleural left lower lobe nodule (series 5, image 127), unchanged from 12/08/2009. No pleural fluid. Airway is unremarkable. Upper abdomen: Liver margin is irregular. Tiny stones are seen in the gallbladder. Right adrenal gland is unremarkable. Lesion in the body of the left adrenal gland measures 1.0 x 2.0 cm, which appears decreased from previous measurement of 2.1 x 4.4 cm on 10/02/2015. Ill-defined soft tissue thickening and necrotic adenopathy are seen in the the left periaortic station, measuring up to 2.4 x 2.4 cm (series 2, image 102), previously measured at 2.6 x 3.1 cm. Visualized portion of the right kidney is unremarkable. Visualized portions of the spleen, pancreas, stomach and bowel are grossly unremarkable with exception of a small hiatal hernia. Porta hepatis lymph nodes measure up to 1.2 cm. Musculoskeletal: T9 and L2 sclerotic lesions. IMPRESSION: 1. Left adrenal metastasis and necrotic retroperitoneal adenopathy appear improved from  10/02/2015. 2. Osseous metastatic disease, grossly stable. 3. Three-vessel coronary artery calcification. 4. Cirrhosis. 5. Cholelithiasis. Electronically Signed   By: MLorin PicketM.D.   On: 01/02/2016 12:10    ASSESSMENT: 70[place]  (1) status post left breast biopsy in February of 2011 for an invasive lobular carcinoma measuring 2.9 cm by MRI, with a positive prechemotherapy axillary lymph node biopsy, strongly estrogen and progesterone receptor positive with no evidence of HER-2/neu amplification and an MIB-1 of 14%.   (2) received 6 cycles of docetaxel/ cyclophosphamide in the neoadjuvant setting, completed in mid July of 2011   (3) s/p left lumpectomy and axillary lymph node dissection in August of 2011 for a ypT1b yTN3 (14 positive lymph nodes), Stage IIIC, grade 1 residual tumor. HER-2/neu was repeated, again not amplified.   (4) Completed loco-regional radiation in late October of 2011   (5) on letrozole starting early November 2011, continued to October 2016, with progression  METASTATIC DISEASE: September 2016: Involving left adrenal gland, regional  nodes and bones  (6) biopsy of a 7.5 cm left adrenal mass 05/30/2015 showed metastatic carcinoma, estrogen, progesterone, and HER-2 negative, with an MIB-1 of 90-100%; the tumor cells were cytokeratin 7 positive, cytokeratin 20 focally positive, gross cystic disease fluid protein negative  (a) the CA-27-29 is informative  (b) PET scan 06/17/2015 shows, in addition to the left adrenal mass, some periaortic adenopathy and multiple hypermetabolic bone metastases (L2, T9, T2 and others)  (c) EGD on 06/16/2015 to evaluate suspicious cardiac wall thickening showed no evidence of malignancy  (7) capecitabine started 07/04/2015 at 1.5 g BID 7/7. Discontinued 08/29/15 with progression  (8) denosumab/ Xgeva started 07/18/2015, repeated monthly  (9) eribulin D1/D8 q 21 days started 09/23/14. 1 complete cycle given  before hospitalization. Discontinued due to elevation in LFTs.   (10) hospitalized for infuenza A on 09/26/15. Pulmonary embolus found. Lovenox started daily.  (11) hepatic encephalopathy during hospital stay in January 2017. Lactulose completed. Awaiting approval of rifaximin due to financial strain.   (12) carboplatin d1/d8 every 21 days started 11/07/2015  (a) day 8 cycle 3 was held due to an increase in the serum creatinine  OTHER PROBLEMS: (a) the patient is status post hemorrhagic CVA January 2016  (b) congenital absence of left kidney  (c) non alcoholic cirrhosis  PLAN:  [facility] scans show unequivocal improvement in her measurable disease. The tumor marker (incidentally a breast tumor marker) also shows significant improvement. Clearly she is benefiting from the carboplatin and the plan will be to continue that.  We are dealing with only one kidney and she did have a significant increase in her creatinine last time. I think were going to have to move the treatments to every 2 weeks. Hopefully by doing that we can prevents or at least delay the development of significant kidney toxicity.  I have reviewed the CT scan with radiology. Her original pulmonary embolus was subsegmental and very small. They could not swear that it has completely resolved but at the same time they do not see any evidence of that on the current scan. I think after 3 months of anticoagulation for very small pulmonary embolus in a woman who did have a hemorrhagic stroke it seemsmost reasonable to stop the Lovenox and we are stopping that today.  She has a friend who went to [facility] and told her all about adrenal tumors. She could have a primary adrenal tumor--her original lobular breast cancer was estrogen receptor positive and this is not--but on the other hand cancers to change when they metastasized at least 10% of the time and again the tumor marker is really a breast tumor marker. I am treating her  essentially has a metastatic breast cancer which is what I think she has.  Of course I have no problem with her trying to fit into a protocol the problem is she only has 1 kidney and she has very limited kidney function. I don't think she would be able to enroll in a study because of these limitations.  At this point, then, the plan is to switch the carboplatin to every 2 weeks, repeat 5 times, which will be 10 weeks, and then repeat a CT scan of the chest. We will continue to follow the CA-27-29 and I will also obtain a d-dimer sometime a month from now.  In the meantime I encouraged her to continue to use her medication for pain at least twice daily. She is going to add sorbitol to her medication list and use  it at least every other day she does not have a bowel movement otherwise. Raizy has a good understanding of this plan. She agrees with it. She knows the goal of treatment is control. She will call with any problems that may develop before the next visit here.    Maria Cruel, MD 01/09/2016 4:56 PM

## 2016-01-09 ENCOUNTER — Telehealth: Payer: Self-pay | Admitting: Oncology

## 2016-01-09 ENCOUNTER — Ambulatory Visit (HOSPITAL_BASED_OUTPATIENT_CLINIC_OR_DEPARTMENT_OTHER): Payer: Medicare Other | Admitting: Oncology

## 2016-01-09 ENCOUNTER — Other Ambulatory Visit: Payer: Self-pay | Admitting: Oncology

## 2016-01-09 ENCOUNTER — Ambulatory Visit: Payer: Medicare Other

## 2016-01-09 ENCOUNTER — Other Ambulatory Visit (HOSPITAL_BASED_OUTPATIENT_CLINIC_OR_DEPARTMENT_OTHER): Payer: Medicare Other

## 2016-01-09 ENCOUNTER — Ambulatory Visit (HOSPITAL_BASED_OUTPATIENT_CLINIC_OR_DEPARTMENT_OTHER): Payer: Medicare Other

## 2016-01-09 VITALS — BP 115/67 | HR 59 | Temp 97.5°F | Resp 18 | Ht 62.5 in | Wt 166.1 lb

## 2016-01-09 DIAGNOSIS — C50912 Malignant neoplasm of unspecified site of left female breast: Secondary | ICD-10-CM

## 2016-01-09 DIAGNOSIS — C7951 Secondary malignant neoplasm of bone: Secondary | ICD-10-CM | POA: Diagnosis not present

## 2016-01-09 DIAGNOSIS — C7972 Secondary malignant neoplasm of left adrenal gland: Secondary | ICD-10-CM | POA: Diagnosis not present

## 2016-01-09 DIAGNOSIS — Z5111 Encounter for antineoplastic chemotherapy: Secondary | ICD-10-CM | POA: Diagnosis present

## 2016-01-09 DIAGNOSIS — C50412 Malignant neoplasm of upper-outer quadrant of left female breast: Secondary | ICD-10-CM

## 2016-01-09 DIAGNOSIS — C50812 Malignant neoplasm of overlapping sites of left female breast: Secondary | ICD-10-CM

## 2016-01-09 DIAGNOSIS — C773 Secondary and unspecified malignant neoplasm of axilla and upper limb lymph nodes: Secondary | ICD-10-CM | POA: Diagnosis not present

## 2016-01-09 DIAGNOSIS — Z95828 Presence of other vascular implants and grafts: Secondary | ICD-10-CM

## 2016-01-09 DIAGNOSIS — K7469 Other cirrhosis of liver: Secondary | ICD-10-CM

## 2016-01-09 LAB — CBC WITH DIFFERENTIAL/PLATELET
BASO%: 0.8 % (ref 0.0–2.0)
BASOS ABS: 0 10*3/uL (ref 0.0–0.1)
EOS%: 2.6 % (ref 0.0–7.0)
Eosinophils Absolute: 0.1 10*3/uL (ref 0.0–0.5)
HEMATOCRIT: 37.9 % (ref 34.8–46.6)
HGB: 13.2 g/dL (ref 11.6–15.9)
LYMPH#: 1.1 10*3/uL (ref 0.9–3.3)
LYMPH%: 24.8 % (ref 14.0–49.7)
MCH: 35.2 pg — AB (ref 25.1–34.0)
MCHC: 34.7 g/dL (ref 31.5–36.0)
MCV: 101.5 fL — ABNORMAL HIGH (ref 79.5–101.0)
MONO#: 0.6 10*3/uL (ref 0.1–0.9)
MONO%: 13 % (ref 0.0–14.0)
NEUT#: 2.5 10*3/uL (ref 1.5–6.5)
NEUT%: 58.8 % (ref 38.4–76.8)
Platelets: 159 10*3/uL (ref 145–400)
RBC: 3.74 10*6/uL (ref 3.70–5.45)
RDW: 13.7 % (ref 11.2–14.5)
WBC: 4.3 10*3/uL (ref 3.9–10.3)

## 2016-01-09 LAB — COMPREHENSIVE METABOLIC PANEL
ALT: 15 U/L (ref 0–55)
AST: 24 U/L (ref 5–34)
Albumin: 3.3 g/dL — ABNORMAL LOW (ref 3.5–5.0)
Alkaline Phosphatase: 205 U/L — ABNORMAL HIGH (ref 40–150)
Anion Gap: 7 mEq/L (ref 3–11)
BUN: 7.3 mg/dL (ref 7.0–26.0)
CALCIUM: 9 mg/dL (ref 8.4–10.4)
CHLORIDE: 97 meq/L — AB (ref 98–109)
CO2: 28 mEq/L (ref 22–29)
Creatinine: 0.9 mg/dL (ref 0.6–1.1)
EGFR: 63 mL/min/{1.73_m2} — ABNORMAL LOW (ref >90)
Glucose: 115 mg/dl (ref 70–140)
POTASSIUM: 4.2 meq/L (ref 3.5–5.1)
Sodium: 132 mEq/L — ABNORMAL LOW (ref 136–145)
Total Bilirubin: 1.25 mg/dL — ABNORMAL HIGH (ref 0.20–1.20)
Total Protein: 6.6 g/dL (ref 6.4–8.3)

## 2016-01-09 MED ORDER — PALONOSETRON HCL INJECTION 0.25 MG/5ML
INTRAVENOUS | Status: AC
  Filled 2016-01-09: qty 5

## 2016-01-09 MED ORDER — SODIUM CHLORIDE 0.9 % IV SOLN
Freq: Once | INTRAVENOUS | Status: AC
  Administered 2016-01-09: 17:00:00 via INTRAVENOUS

## 2016-01-09 MED ORDER — PALONOSETRON HCL INJECTION 0.25 MG/5ML
0.2500 mg | Freq: Once | INTRAVENOUS | Status: AC
  Administered 2016-01-09: 0.25 mg via INTRAVENOUS

## 2016-01-09 MED ORDER — SODIUM CHLORIDE 0.9 % IJ SOLN
10.0000 mL | INTRAMUSCULAR | Status: DC | PRN
  Administered 2016-01-09: 10 mL via INTRAVENOUS
  Filled 2016-01-09: qty 10

## 2016-01-09 MED ORDER — HEPARIN SOD (PORK) LOCK FLUSH 100 UNIT/ML IV SOLN
500.0000 [IU] | Freq: Once | INTRAVENOUS | Status: AC | PRN
  Administered 2016-01-09: 500 [IU]
  Filled 2016-01-09: qty 5

## 2016-01-09 MED ORDER — SODIUM CHLORIDE 0.9 % IV SOLN
138.0000 mg | Freq: Once | INTRAVENOUS | Status: AC
  Administered 2016-01-09: 140 mg via INTRAVENOUS
  Filled 2016-01-09: qty 14

## 2016-01-09 MED ORDER — SORBITOL 70 % PO SOLN: 15.0000 mL | Freq: Every day | ORAL | Status: DC | PRN

## 2016-01-09 MED ORDER — SODIUM CHLORIDE 0.9% FLUSH
10.0000 mL | INTRAVENOUS | Status: DC | PRN
  Administered 2016-01-09: 10 mL
  Filled 2016-01-09: qty 10

## 2016-01-09 MED ORDER — ONDANSETRON HCL 8 MG PO TABS: 8.0000 mg | ORAL_TABLET | Freq: Three times a day (TID) | ORAL | Status: DC | PRN

## 2016-01-09 MED ORDER — DEXAMETHASONE SODIUM PHOSPHATE 100 MG/10ML IJ SOLN
10.0000 mg | Freq: Once | INTRAMUSCULAR | Status: AC
  Administered 2016-01-09: 10 mg via INTRAVENOUS
  Filled 2016-01-09: qty 1

## 2016-01-09 MED FILL — ONDANSETRON HCL 8 MG TABLET: 8 | 20 days supply | Qty: 60 | Fill #0

## 2016-01-09 NOTE — Patient Instructions (Signed)

## 2016-01-09 NOTE — Telephone Encounter (Signed)
Gave adn printed appt sched and avs for pt for may

## 2016-01-09 NOTE — Patient Instructions (Signed)
[facility] Discharge Instructions for Patients Receiving Chemotherapy  Today you received the following chemotherapy agents Carboplatin  To help prevent nausea and vomiting after your treatment, we encourage you to take your nausea medication   If you develop nausea and vomiting that is not controlled by your nausea medication, call the clinic.   BELOW ARE SYMPTOMS THAT SHOULD BE REPORTED IMMEDIATELY:  *FEVER GREATER THAN 100.5 F  *CHILLS WITH OR WITHOUT FEVER  NAUSEA AND VOMITING THAT IS NOT CONTROLLED WITH YOUR NAUSEA MEDICATION  *UNUSUAL SHORTNESS OF BREATH  *UNUSUAL BRUISING OR BLEEDING  TENDERNESS IN MOUTH AND THROAT WITH OR WITHOUT PRESENCE OF ULCERS  *URINARY PROBLEMS  *BOWEL PROBLEMS  UNUSUAL RASH Items with * indicate a potential emergency and should be followed up as soon as possible.  Feel free to call the clinic you have any questions or concerns. The clinic phone number is [phone].  Please show the CHEMO ALERT CARD at check-in to the Emergency Department and triage nurse.   

## 2016-01-10 ENCOUNTER — Telehealth: Payer: Self-pay | Admitting: *Deleted

## 2016-01-10 LAB — CANCER ANTIGEN 27.29: CA 27.29: 33.6 U/mL (ref 0.0–38.6)

## 2016-01-10 NOTE — Telephone Encounter (Signed)
Per staff message and POF I have scheduled appts. Advised scheduler of appts. JMW  

## 2016-01-12 ENCOUNTER — Telehealth: Payer: Self-pay | Admitting: *Deleted

## 2016-01-12 NOTE — Telephone Encounter (Signed)
Patient called and left message to move her appts on 5/15 to after 12pm. Appts moved and attempted to call patient several times. I will try again tomorrow.

## 2016-01-13 ENCOUNTER — Telehealth: Payer: Self-pay | Admitting: *Deleted

## 2016-01-13 ENCOUNTER — Other Ambulatory Visit: Payer: Self-pay | Admitting: *Deleted

## 2016-01-13 NOTE — Telephone Encounter (Signed)
This RN called and informed family member that pt does not need to come in for any appointments including flush with lab on Monday.  Next scheduled appointment is for 5/15.  This RN canceled lab with flush.

## 2016-01-13 NOTE — Telephone Encounter (Signed)
I have spoke with the sister and gave new appt times on 5/15. They were wondering if she needs to keep appts on 5/8, left message for desk RN to call her

## 2016-01-16 ENCOUNTER — Ambulatory Visit: Payer: Medicare Other

## 2016-01-16 ENCOUNTER — Other Ambulatory Visit: Payer: Medicare Other

## 2016-01-16 ENCOUNTER — Ambulatory Visit: Payer: Medicare Other | Admitting: Nurse Practitioner

## 2016-01-23 ENCOUNTER — Telehealth: Payer: Self-pay | Admitting: Nurse Practitioner

## 2016-01-23 ENCOUNTER — Ambulatory Visit (HOSPITAL_BASED_OUTPATIENT_CLINIC_OR_DEPARTMENT_OTHER): Payer: Medicare Other

## 2016-01-23 ENCOUNTER — Encounter: Payer: Self-pay | Admitting: Nurse Practitioner

## 2016-01-23 ENCOUNTER — Other Ambulatory Visit: Payer: Self-pay | Admitting: *Deleted

## 2016-01-23 ENCOUNTER — Ambulatory Visit: Payer: Medicare Other

## 2016-01-23 ENCOUNTER — Other Ambulatory Visit (HOSPITAL_BASED_OUTPATIENT_CLINIC_OR_DEPARTMENT_OTHER): Payer: Medicare Other

## 2016-01-23 ENCOUNTER — Ambulatory Visit (HOSPITAL_BASED_OUTPATIENT_CLINIC_OR_DEPARTMENT_OTHER): Payer: Medicare Other | Admitting: Nurse Practitioner

## 2016-01-23 VITALS — BP 104/65 | HR 58 | Temp 97.5°F | Resp 16 | Wt 161.4 lb

## 2016-01-23 DIAGNOSIS — Z171 Estrogen receptor negative status [ER-]: Secondary | ICD-10-CM

## 2016-01-23 DIAGNOSIS — C50912 Malignant neoplasm of unspecified site of left female breast: Secondary | ICD-10-CM

## 2016-01-23 DIAGNOSIS — K7469 Other cirrhosis of liver: Secondary | ICD-10-CM | POA: Diagnosis not present

## 2016-01-23 DIAGNOSIS — R109 Unspecified abdominal pain: Principal | ICD-10-CM

## 2016-01-23 DIAGNOSIS — G8929 Other chronic pain: Secondary | ICD-10-CM

## 2016-01-23 DIAGNOSIS — E278 Other specified disorders of adrenal gland: Secondary | ICD-10-CM

## 2016-01-23 DIAGNOSIS — C50412 Malignant neoplasm of upper-outer quadrant of left female breast: Secondary | ICD-10-CM

## 2016-01-23 DIAGNOSIS — Z95828 Presence of other vascular implants and grafts: Secondary | ICD-10-CM

## 2016-01-23 DIAGNOSIS — C7951 Secondary malignant neoplasm of bone: Secondary | ICD-10-CM

## 2016-01-23 DIAGNOSIS — C773 Secondary and unspecified malignant neoplasm of axilla and upper limb lymph nodes: Secondary | ICD-10-CM

## 2016-01-23 DIAGNOSIS — C50919 Malignant neoplasm of unspecified site of unspecified female breast: Secondary | ICD-10-CM

## 2016-01-23 DIAGNOSIS — K5900 Constipation, unspecified: Secondary | ICD-10-CM

## 2016-01-23 DIAGNOSIS — Z5111 Encounter for antineoplastic chemotherapy: Secondary | ICD-10-CM | POA: Diagnosis not present

## 2016-01-23 DIAGNOSIS — C7972 Secondary malignant neoplasm of left adrenal gland: Secondary | ICD-10-CM

## 2016-01-23 LAB — CBC WITH DIFFERENTIAL/PLATELET
BASO%: 0.2 % (ref 0.0–2.0)
BASOS ABS: 0 10*3/uL (ref 0.0–0.1)
EOS%: 1.3 % (ref 0.0–7.0)
Eosinophils Absolute: 0.1 10*3/uL (ref 0.0–0.5)
HCT: 35 % (ref 34.8–46.6)
HGB: 12.7 g/dL (ref 11.6–15.9)
LYMPH%: 20.4 % (ref 14.0–49.7)
MCH: 35.2 pg — AB (ref 25.1–34.0)
MCHC: 36.3 g/dL — AB (ref 31.5–36.0)
MCV: 97 fL (ref 79.5–101.0)
MONO#: 0.7 10*3/uL (ref 0.1–0.9)
MONO%: 14 % (ref 0.0–14.0)
NEUT#: 3.3 10*3/uL (ref 1.5–6.5)
NEUT%: 64.1 % (ref 38.4–76.8)
Platelets: 196 10*3/uL (ref 145–400)
RBC: 3.61 10*6/uL — ABNORMAL LOW (ref 3.70–5.45)
RDW: 13.4 % (ref 11.2–14.5)
WBC: 5.2 10*3/uL (ref 3.9–10.3)
lymph#: 1.1 10*3/uL (ref 0.9–3.3)

## 2016-01-23 LAB — COMPREHENSIVE METABOLIC PANEL
ALT: 16 U/L (ref 0–55)
AST: 24 U/L (ref 5–34)
Albumin: 3.3 g/dL — ABNORMAL LOW (ref 3.5–5.0)
Alkaline Phosphatase: 176 U/L — ABNORMAL HIGH (ref 40–150)
Anion Gap: 7 mEq/L (ref 3–11)
BUN: 15.3 mg/dL (ref 7.0–26.0)
CHLORIDE: 98 meq/L (ref 98–109)
CO2: 28 mEq/L (ref 22–29)
Calcium: 8.8 mg/dL (ref 8.4–10.4)
Creatinine: 0.9 mg/dL (ref 0.6–1.1)
EGFR: 63 mL/min/{1.73_m2} — ABNORMAL LOW (ref >90)
GLUCOSE: 89 mg/dL (ref 70–140)
POTASSIUM: 4.3 meq/L (ref 3.5–5.1)
SODIUM: 133 meq/L — AB (ref 136–145)
Total Bilirubin: 1.66 mg/dL — ABNORMAL HIGH (ref 0.20–1.20)
Total Protein: 6.7 g/dL (ref 6.4–8.3)

## 2016-01-23 MED ORDER — HEPARIN SOD (PORK) LOCK FLUSH 100 UNIT/ML IV SOLN
500.0000 [IU] | Freq: Once | INTRAVENOUS | Status: DC | PRN
  Filled 2016-01-23: qty 5

## 2016-01-23 MED ORDER — DENOSUMAB 120 MG/1.7ML ~~LOC~~ SOLN
120.0000 mg | Freq: Once | SUBCUTANEOUS | Status: AC
  Administered 2016-01-23: 120 mg via SUBCUTANEOUS
  Filled 2016-01-23: qty 1.7

## 2016-01-23 MED ORDER — ALTEPLASE 2 MG IJ SOLR
2.0000 mg | Freq: Once | INTRAMUSCULAR | Status: DC | PRN
  Filled 2016-01-23: qty 2

## 2016-01-23 MED ORDER — SODIUM CHLORIDE 0.9% FLUSH
10.0000 mL | INTRAVENOUS | Status: DC | PRN
  Administered 2016-01-23: 10 mL
  Filled 2016-01-23: qty 10

## 2016-01-23 MED ORDER — SODIUM CHLORIDE 0.9 % IV SOLN
Freq: Once | INTRAVENOUS | Status: AC
  Administered 2016-01-23: 14:00:00 via INTRAVENOUS

## 2016-01-23 MED ORDER — OXYCODONE HCL 5 MG PO TABS: 5.0000 mg | ORAL_TABLET | ORAL | Status: DC | PRN

## 2016-01-23 MED ORDER — SODIUM CHLORIDE 0.9 % IJ SOLN
10.0000 mL | INTRAMUSCULAR | Status: DC | PRN
  Administered 2016-01-23: 10 mL via INTRAVENOUS
  Filled 2016-01-23: qty 10

## 2016-01-23 MED ORDER — HEPARIN SOD (PORK) LOCK FLUSH 100 UNIT/ML IV SOLN
500.0000 [IU] | Freq: Once | INTRAVENOUS | Status: AC | PRN
  Administered 2016-01-23: 500 [IU]
  Filled 2016-01-23: qty 5

## 2016-01-23 MED ORDER — DEXAMETHASONE SODIUM PHOSPHATE 100 MG/10ML IJ SOLN
10.0000 mg | Freq: Once | INTRAMUSCULAR | Status: AC
  Administered 2016-01-23: 10 mg via INTRAVENOUS
  Filled 2016-01-23: qty 1

## 2016-01-23 MED ORDER — PALONOSETRON HCL INJECTION 0.25 MG/5ML
INTRAVENOUS | Status: AC
  Filled 2016-01-23: qty 5

## 2016-01-23 MED ORDER — SODIUM CHLORIDE 0.9 % IV SOLN
138.0000 mg | Freq: Once | INTRAVENOUS | Status: AC
  Administered 2016-01-23: 140 mg via INTRAVENOUS
  Filled 2016-01-23: qty 14

## 2016-01-23 MED ORDER — PALONOSETRON HCL INJECTION 0.25 MG/5ML
0.2500 mg | Freq: Once | INTRAVENOUS | Status: AC
  Administered 2016-01-23: 0.25 mg via INTRAVENOUS

## 2016-01-23 MED FILL — SORBITOL 70% SOLUTION: 70 | 31 days supply | Qty: 474 | Fill #0

## 2016-01-23 NOTE — Patient Instructions (Signed)

## 2016-01-23 NOTE — Progress Notes (Signed)
Per Nira Conn NP, okay to tx with total bilirubin 1.6.

## 2016-01-23 NOTE — Telephone Encounter (Signed)
appt made and avs printed °

## 2016-01-23 NOTE — Progress Notes (Signed)
. Maria Pruitt   DOB: Aug 04, 1946  MR#: [id number]  BJY#:[id number]  Maria Nova, PA-C GYN:  SU:  OTHER MD:  CHIEF COMPLAINT: left adrenal metastatsis  CURRENT TREATMENT: denosumab, carboplatin  BREAST CANCER HISTORY:  from the original intake note:  Maria Pruitt herself palpated a change in her left breast, shortly before her mammogram was due.  She brought this to Dr. Wilder Pruitt attention and he set her up for diagnostic mammography on October 31, 2009.   Routine and implant displaced views of both breasts showed a 2 cm irregular mass in the outer left breast, seen only on the spot tangential view.  The breast tissue itself was fatty.  There were no suspicious calcifications.  This mass was palpable to Dr. Melanee Pruitt.  An ultrasound showed it to be 1.7 cm irregular and hypoechoic.  There were at least two enlarged left axillary lymph nodes identified.  Dr. Melanee Pruitt recommended biopsy which was performed the same day and showed ([id number]) and invasive breast cancer felt most likely to be ductal with lobular features.  The left axillary lymph node biopsy also showed the same tumor (similar morphologic findings) and both prognostic profiles showed the cancer to be strongly ER and PR positive with a low to borderline proliferation fraction (13 and 14%).  Neither mass showed amplification of Her-2 by CISH.  (The ratios were 1.1 and 0.85.)    With this information, the patient was referred to Dr. Margot Pruitt and bilateral breast MRIs were obtained March 1st.  This showed several left axillary lymph nodes with cortical thickening, the largest one measuring 1.1 cm.  The mass itself measured 2.9 cm by MRI.  It was spiculated and irregular.  There was no evidence of contralateral disease.   She was treated neoadjuvantly with docetaxel and cyclophosphamide for 6 cycles completed in July 2011, after which she underwent left lumpectomy and axillary dissection August 2011 for a ypT1b yTN3 (14 positive lymph nodes),  Stage IIIC, grade 1 residual tumor. HER-2/neu was repeated, again not amplified. After completing local regional radiation October 2011 she started letrozole November 2011, continued to September 2016, which she was found to have metastatic disease   METASTATIC DISEASE HISTORY: From the 06/08/2015 summary:   Maria Pruitt returns today for a new problem accompanied by her sister Maria Pruitt. To summarize the recent history: She had a hemorrhagic stroke in January 2016 with some residuals. She has been living next door to her son in the [place] area since then.--In August she presented to her primary care physician, Dr. Thornton Pruitt, with a complaint of abdominal discomfort, nausea and vomiting. He treated her for a UTI w/o resolution and obtained a KUB in his office; this was nondiagnostic. Accordingly he set up the patient for CT scan of the abdomen and pelvis 05/06/2015. This showed the left adrenal gland to be enlarged to 7.5 x 4.0 cm. There was eccentric wall thickening of the gastric cardia. There were small lymph nodes in the upper abdomen and retroperitoneum. There was no liver involvement. The right adrenal gland was unremarkable. There was severe left kidney atrophy (congenital).  The patient was then set up for biopsy of the left adrenal mass on 05/30/2015. This showed Maria Pruitt [id number]) metastatic carcinoma which was cytokeratin 7 positive, with rare cytokeratin 20 positivity and was negative for gross cystic disease fluid protein. The prognostic panel showed this tumor to be estrogen and progesterone receptor negative, HER-2 negative, with an MIB-1 of 90-100%.   PET scan10/03/2015 which showed in additional to the  left adrenal mass, some regional lymph nodes and multiple bone lesions. There was no obvious primary tumor. She also had an EGD to evaluate a possible gastric primary suggested by the yearly a CT scan of the chest. This was normal. We also obtained tumor markerswhich showed a normal CA 19,a mildly  elevated CEA at 9.7,Maria Pruitt is significantly elevated CA-27-29 at 125. Given this data, the most likely interpretation is that we are dealing with an estrogen receptor negative recurrence of her earlier estrogen receptor positive breast cancer, now stage IV  Her subsequent history is as detailed below  INTERVAL HISTORY: Maria Pruitt returns today for follow-up of her triple negative breast cancer metastatic to the adrenal gland, accompanied by sister. Today is day 1 cycle 5 of carboplatin. She has received 3 previous cycles of carboplatin, given days 1 and 8 of each 21 day cycle, with the day 8 of cycle 3 held because of a bump in her creatinine.   REVIEW OF SYSTEMS: Maria Pruitt denies fevers or chills. She only needed zofran once for nausea. She continues on oxycodone 57m at least twice daily for her left adrenal gland pain. This unfortunately constipates her despite taking 2 colace pills twice daily. She was reduced to using an enema this past week. She is prescribed sorbitol but was not able to pick it up from the pharmacy and miralax is not helpful. Otherwise she is doing well. Her energy level is good. She denies shortness of breath, chest pain, cough, or palpitations. She has no headaches, dizziness, or vision changes. A detailed review of systems is otherwise stable.  PAST MEDICAL HISTORY: Past Medical History  Diagnosis Date  . Hypertension   . Breast cancer ([facility] 2011    Stage 3  . Atrial fibrillation ([facility]   . Hypercholesterolemia   . Abnormal ECG   . Allergy     seasonal  . Arthritis   . GERD (gastroesophageal reflux disease)   . 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 Seizures ([facility]   . Stroke ([facility]     dec 15  . Thyroid disease   . Adrenal cancer ([facility]   . Fatty liver   . Influenza A    1. Significant for pituitary adenoma which was removed in 1983 but recurred, requiring radiation and briefly some Parlodel.  She has been off treatment since 1985.  She has some  acromegaly secondary to this tumor.   2. She is status post bilateral submuscular breast implants under Dr. TTowanda Malkinin 1987.   3. She is status post C-section for her third child who was hydrocephalic.   4. She underwent rhinoplasty in 1984.   5. She has a history of hypertension. 6. History of mitral valve prolapse, but she says she has not been receiving antibiotics prior to dental or similar procedures.   7. History of hypothyroidism.   8. History of congenital absence of one kidney.   9. History of hyperlipidemia.  10. History of obesity.   11. History of renal stones.   12. History of fatty liver.   13. History of gout.   14. History of palpitations.   History of childhood asthma  PAST SURGICAL HISTORY: Past Surgical History  Procedure Laterality Date  . Cesarean section  1980  . Breast enhancement surgery  1987  . Umbilical hernia repair    . Tubal ligation    . Scar revision    . Pituitary surgery      adenoma  .  Rhinoplasty  1985  . Breast lumpectomy Left   . Colonoscopy      FAMILY HISTORY Family History  Problem Relation Age of Onset  . Heart disease Mother   . Heart attack Father   . Heart attack Brother   . Colon cancer Neg Hx    The patient's father died from a myocardial infarction at the age of 70.  The patient's mother died from complications of congestive heart failure at the age of 27.  The patient had one brother who died from a myocardial infarction at the age of 17.  One of the brothers and two sisters are alive and well.  Sr. Maria Pruitt is an ICU nurse. There is no history of breast or ovarian cancer in the family to her knowledge.  GYNECOLOGIC HISTORY: She is GX P3, first pregnancy to term at age 21.  She went through the change of life at the time of her pituitary surgery in 1983.  She took hormone replacement for less than a year because of poor tolerance.    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  She gave up her job in 07-24-2010.  Her first husband died from chronic myeloid leukemia in 1985.  Her three children from that marriage include her son Randall Hiss who died from complications of hydrocephaly at age 61; son [name] who is a Insurance underwriter and son [name] who is an Public house manager in this area.  The patient has five grandchildren.  Her second husband of 20+ years, Ron, is a Company secretary of an independent [employer] and also does Writer and is a Oceanographer. He is now disabled due toa traumatic brain injury. He lost his first wife to endometrial cancer.  He has a daughter from that marriage.  She lives in [place]: not in place  HEALTH MAINTENANCE: Social History  Substance Use Topics  . Smoking status: Never Smoker   . Smokeless tobacco: Never Used  . Alcohol Use: No     Colonoscopy:  PAP:  Bone density: January 2012/ osteopenia  Lipid panel:  Allergies  Allergen Reactions  . Ciprofloxacin Other (See Comments)  . Clonidine Derivatives Other (See Comments)  . Phenytoin     Other reaction(s): Other Elevated LFTs  . Sulfa Antibiotics Hives  . Benzonatate     REACTION: Swelling, tessalon perles; tolerates benadryl  . Codeine     REACTION: Nausea    Current Outpatient Prescriptions  Medication Sig Dispense Refill  . Ascorbic Acid (VITAMIN C) 100 MG tablet Take 1,000 mg by mouth daily.     . calcium carbonate (TUMS - DOSED IN MG ELEMENTAL CALCIUM) 500 MG chewable tablet Chew 1 tablet (200 mg of elemental calcium total) by mouth 3 (three) times daily. 30 tablet 0  . enoxaparin (LOVENOX) 40 MG/0.4ML injection Inject 0.4 mLs (40 mg total) into the skin daily. 30 Syringe 1  . famotidine (PEPCID) 20 MG tablet Take 1 tablet (20 mg total) by mouth 2 (two) times daily. 180 tablet 4  . hydrochlorothiazide (HYDRODIURIL) 25 MG tablet Take 1 tablet (25 mg total) by mouth daily.    Marland Kitchen levothyroxine (SYNTHROID, LEVOTHROID) 100 MCG tablet  Take 88 mcg by mouth daily before breakfast.    . loratadine (CLARITIN) 10 MG tablet Take 10 mg by mouth daily.    Marland Kitchen losartan (COZAAR) 50 MG tablet Take 50 mg by mouth daily.    Marland Kitchen  metoprolol (LOPRESSOR) 50 MG tablet Take 50 mg by mouth 2 (two) times daily.    . ondansetron (ZOFRAN) 8 MG tablet Take 1 tablet (8 mg total) by mouth every 8 (eight) hours as needed for nausea or vomiting. 60 tablet 6  . aspirin (ASPIRIN CHILDRENS) 81 MG chewable tablet Chew 1 tablet (81 mg total) by mouth daily. (Patient not taking: Reported on 12/05/2015) 100 tablet 4  . lactulose (CHRONULAC) 10 GM/15ML solution Take 45 mLs (30 g total) by mouth 2 (two) times daily. (Patient not taking: Reported on 12/26/2015) 240 mL 1  . metoCLOPramide (REGLAN) 5 MG tablet Take 1 tablet (5 mg total) by mouth 3 (three) times daily as needed for nausea. (Patient not taking: Reported on 01/23/2016) 30 tablet 1  . oxyCODONE (OXY IR/ROXICODONE) 5 MG immediate release tablet Take 1 tablet (5 mg total) by mouth every 4 (four) hours as needed for severe pain. 30 tablet 0  . sorbitol 70 % solution Take 15 mLs by mouth daily as needed. (Patient not taking: Reported on 01/23/2016) 473 mL 0   No current facility-administered medications for this visit.   Facility-Administered Medications Ordered in Other Visits  Medication Dose Route Frequency Provider Last Rate Last Dose  . 0.9 %  sodium chloride infusion   Intravenous Once Maria Cruel, MD        OBJECTIVE: Middle-aged white woman who appears stated age 36 Vitals:   01/23/16 1220  BP: 104/65  Pulse: 58  Temp: 97.5 F (36.4 C)  Resp: 16     Body mass index is 29.03 kg/(m^2).    ECOG FS: 1  Skin: warm, dry  HEENT: sclerae anicteric, conjunctivae pink, oropharynx clear. No thrush or mucositis.  Lymph Nodes: No cervical or supraclavicular lymphadenopathy  Lungs: clear to auscultation bilaterally, no rales, wheezes, or rhonci  Heart: regular rate and rhythm  Abdomen: round, soft,  non tender, positive bowel sounds  Musculoskeletal: No focal spinal tenderness, no peripheral edema  Neuro: non focal, well oriented, positive affect  Breasts: deferred  LAB RESULTS:   CA 27.29 0.0 - 38.6 U/mL 45.2 (H) 84.9 (H)CM 178.1 (H)CM         CBC Latest Ref Rng 01/23/2016 01/09/2016 12/26/2015  WBC 3.9 - 10.3 10e3/uL 5.2 4.3 5.0  Hemoglobin 11.6 - 15.9 g/dL 12.7 13.2 14.0  Hematocrit 34.8 - 46.6 % 35.0 37.9 39.6  Platelets 145 - 400 10e3/uL 196 159 160     CMP Latest Ref Rng 01/23/2016 01/09/2016 12/26/2015  Glucose 70 - 140 mg/dl 89 115 95  BUN 7.0 - 26.0 mg/dL 15.3 7.3 21.9  Creatinine 0.6 - 1.1 mg/dL 0.9 0.9 1.3(H)  Sodium 136 - 145 mEq/L 133(L) 132(L) 128(L)  Potassium 3.5 - 5.1 mEq/L 4.3 4.2 3.8  CO2 22 - 29 mEq/L _0 Calcium 8.4 - 10.4 mg/dL 8.8 9.0 8.7  Total Protein 6.4 - 8.3 g/dL 6.7 6.6 6.8  Total Bilirubin 0.20 - 1.20 mg/dL 1.66(H) 1.25(H) 1.13  Alkaline Phos 40 - 150 U/L 176(H) 205(H) 262(H)  AST 5 - 34 U/L 24 24 41(H)  ALT 0 - 55 U/L _1 STUDIES: Ct Chest W Contrast  01/02/2016  CLINICAL DATA:  Left breast cancer with hepatic metastases. Adrenal and osseous metastases. Chemotherapy in progress. Weight loss. EXAM: CT CHEST WITH CONTRAST TECHNIQUE: Multidetector CT imaging of the chest was performed during intravenous contrast administration. CONTRAST:  42m ISOVUE-300 IOPAMIDOL (ISOVUE-300) INJECTION 61% COMPARISON:  MR abdomen 10/02/2015,  CT chest 09/26/2015 and PET 06/17/2015. CT chest 12/08/2009. FINDINGS: Mediastinum/Nodes: Right IJ Port-A-Cath terminates in the low SVC. Mediastinal lymph nodes are not enlarged by CT size criteria. No hilar or axillary adenopathy. Surgical clips in left axilla. Three-vessel coronary artery calcification. Heart size within normal limits. No pericardial effusion. Small hiatal hernia. Lungs/Pleura: Patchy ground-glass and mild architectural distortion are again seen in the left upper lobe, possibly treatment related.  Streaky atelectasis or scarring in the right middle lobe. Right lower lobe nodule measures 5 mm (series 5, image 103), stable from 09/26/2015. 6 mm subpleural left lower lobe nodule (series 5, image 127), unchanged from 12/08/2009. No pleural fluid. Airway is unremarkable. Upper abdomen: Liver margin is irregular. Tiny stones are seen in the gallbladder. Right adrenal gland is unremarkable. Lesion in the body of the left adrenal gland measures 1.0 x 2.0 cm, which appears decreased from previous measurement of 2.1 x 4.4 cm on 10/02/2015. Ill-defined soft tissue thickening and necrotic adenopathy are seen in the the left periaortic station, measuring up to 2.4 x 2.4 cm (series 2, image 102), previously measured at 2.6 x 3.1 cm. Visualized portion of the right kidney is unremarkable. Visualized portions of the spleen, pancreas, stomach and bowel are grossly unremarkable with exception of a small hiatal hernia. Porta hepatis lymph nodes measure up to 1.2 cm. Musculoskeletal: T9 and L2 sclerotic lesions. IMPRESSION: 1. Left adrenal metastasis and necrotic retroperitoneal adenopathy appear improved from 10/02/2015. 2. Osseous metastatic disease, grossly stable. 3. Three-vessel coronary artery calcification. 4. Cirrhosis. 5. Cholelithiasis. Electronically Signed   By: Lorin Picket M.D.   On: 01/02/2016 12:10    ASSESSMENT: 70 y.o. [place], [place] woman  (1) status post left breast biopsy in February of 2011 for an invasive lobular carcinoma measuring 2.9 cm by MRI, with a positive prechemotherapy axillary lymph node biopsy, strongly estrogen and progesterone receptor positive with no evidence of HER-2/neu amplification and an MIB-1 of 14%.   (2) received 6 cycles of docetaxel/ cyclophosphamide in the neoadjuvant setting, completed in mid July of 2011   (3) s/p left lumpectomy and axillary lymph node dissection in August of 2011 for a ypT1b yTN3 (14 positive lymph nodes), Stage IIIC, grade 1 residual  tumor. HER-2/neu was repeated, again not amplified.   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 Discontinued 08/29/15 with progression  (8) denosumab/ Xgeva started 07/18/2015, repeated monthly  (9) eribulin D1/D8 q 21 days started 09/23/14. 1 complete cycle given before hospitalization. Discontinued due to elevation in LFTs.   (10) hospitalized for infuenza A on 09/26/15. Pulmonary embolus found. Lovenox started daily.  (11) hepatic encephalopathy during hospital stay in January 2017. Lactulose completed. Awaiting approval of rifaximin due to financial strain.   (12) carboplatin d1/d8  every 21 days started 11/07/2015  (a) day 8 cycle 3 was held due to an increase in the serum creatinine  OTHER PROBLEMS: (a) the patient is status post hemorrhagic CVA January 2016  (b) congenital absence of left kidney  (c) non alcoholic cirrhosis  PLAN:  Maria Pruitt is doing well today. We have tracked down her prescription for sorbitol at our outpatient pharmacy so she will begin this at least daily for her constipation, titrating up or down depending on how frequently her stools  begin after this. The labs were reviewed in detail and her bilirubin is up to 1.6 today, while her alk phos dropped and her AST an ALT are normal. She will proceed with cycle 5 of carboplatin as planned today. We will continue to monitor these values.   Maria Pruitt will return in 2 weeks for cycle 9 of treatment. She understands and agrees with this plan. She knows the goal of treatment in her case is control. She has been encouraged to call with any issues that might arise before her next visit here.    Laurie Panda, NP 01/23/2016 12:53 PM

## 2016-01-25 MED FILL — oxyCODONE HCL 5 MG TABS: 5 | 5 days supply | Qty: 30 | Fill #0

## 2016-02-07 ENCOUNTER — Ambulatory Visit: Payer: Medicare Other

## 2016-02-07 ENCOUNTER — Telehealth: Payer: Self-pay | Admitting: Nurse Practitioner

## 2016-02-07 ENCOUNTER — Other Ambulatory Visit (HOSPITAL_BASED_OUTPATIENT_CLINIC_OR_DEPARTMENT_OTHER): Payer: Medicare Other

## 2016-02-07 ENCOUNTER — Ambulatory Visit (HOSPITAL_BASED_OUTPATIENT_CLINIC_OR_DEPARTMENT_OTHER): Payer: Medicare Other | Admitting: Nurse Practitioner

## 2016-02-07 ENCOUNTER — Other Ambulatory Visit: Payer: Self-pay | Admitting: Nurse Practitioner

## 2016-02-07 VITALS — BP 120/89 | HR 123 | Resp 18 | Ht 62.5 in | Wt 159.4 lb

## 2016-02-07 DIAGNOSIS — R109 Unspecified abdominal pain: Secondary | ICD-10-CM

## 2016-02-07 DIAGNOSIS — K5900 Constipation, unspecified: Secondary | ICD-10-CM

## 2016-02-07 DIAGNOSIS — Z95828 Presence of other vascular implants and grafts: Secondary | ICD-10-CM

## 2016-02-07 DIAGNOSIS — C50912 Malignant neoplasm of unspecified site of left female breast: Secondary | ICD-10-CM | POA: Diagnosis present

## 2016-02-07 DIAGNOSIS — I214 Non-ST elevation (NSTEMI) myocardial infarction: Secondary | ICD-10-CM | POA: Diagnosis not present

## 2016-02-07 DIAGNOSIS — Z7901 Long term (current) use of anticoagulants: Secondary | ICD-10-CM

## 2016-02-07 DIAGNOSIS — C7951 Secondary malignant neoplasm of bone: Principal | ICD-10-CM

## 2016-02-07 DIAGNOSIS — C50812 Malignant neoplasm of overlapping sites of left female breast: Secondary | ICD-10-CM

## 2016-02-07 DIAGNOSIS — G8929 Other chronic pain: Secondary | ICD-10-CM

## 2016-02-07 DIAGNOSIS — C50412 Malignant neoplasm of upper-outer quadrant of left female breast: Secondary | ICD-10-CM

## 2016-02-07 DIAGNOSIS — Z7982 Long term (current) use of aspirin: Secondary | ICD-10-CM | POA: Diagnosis not present

## 2016-02-07 LAB — COMPREHENSIVE METABOLIC PANEL
ALT: 11 U/L (ref 0–55)
ANION GAP: 8 meq/L (ref 3–11)
AST: 19 U/L (ref 5–34)
Albumin: 3.1 g/dL — ABNORMAL LOW (ref 3.5–5.0)
Alkaline Phosphatase: 143 U/L (ref 40–150)
BILIRUBIN TOTAL: 1.78 mg/dL — AB (ref 0.20–1.20)
BUN: 4.1 mg/dL — AB (ref 7.0–26.0)
CO2: 23 meq/L (ref 22–29)
CREATININE: 0.7 mg/dL (ref 0.6–1.1)
Calcium: 8.8 mg/dL (ref 8.4–10.4)
Chloride: 98 mEq/L (ref 98–109)
EGFR: >90 mL/min/{1.73_m2} (ref >90)
Glucose: 90 mg/dl (ref 70–140)
Potassium: 4 mEq/L (ref 3.5–5.1)
SODIUM: 129 meq/L — AB (ref 136–145)
TOTAL PROTEIN: 6.5 g/dL (ref 6.4–8.3)

## 2016-02-07 LAB — CBC WITH DIFFERENTIAL/PLATELET
BASO%: 0.7 % (ref 0.0–2.0)
BASOS ABS: 0 10*3/uL (ref 0.0–0.1)
EOS%: 2.7 % (ref 0.0–7.0)
Eosinophils Absolute: 0.2 10*3/uL (ref 0.0–0.5)
HCT: 34.4 % — ABNORMAL LOW (ref 34.8–46.6)
HGB: 12.3 g/dL (ref 11.6–15.9)
LYMPH%: 18 % (ref 14.0–49.7)
MCH: 35.2 pg — AB (ref 25.1–34.0)
MCHC: 35.8 g/dL (ref 31.5–36.0)
MCV: 98.6 fL (ref 79.5–101.0)
MONO#: 0.8 10*3/uL (ref 0.1–0.9)
MONO%: 13.8 % (ref 0.0–14.0)
NEUT#: 3.8 10*3/uL (ref 1.5–6.5)
NEUT%: 64.8 % (ref 38.4–76.8)
Platelets: 177 10*3/uL (ref 145–400)
RBC: 3.49 10*6/uL — AB (ref 3.70–5.45)
RDW: 14.3 % (ref 11.2–14.5)
WBC: 5.9 10*3/uL (ref 3.9–10.3)
lymph#: 1.1 10*3/uL (ref 0.9–3.3)

## 2016-02-07 MED ORDER — EPINEPHRINE HCL 0.1 MG/ML IJ SOSY
0.2500 mg | PREFILLED_SYRINGE | Freq: Once | INTRAMUSCULAR | Status: DC | PRN
  Filled 2016-02-07: qty 10

## 2016-02-07 MED ORDER — DIPHENHYDRAMINE HCL 50 MG/ML IJ SOLN: 25.0000 mg | Freq: Once | INTRAMUSCULAR | Status: DC | PRN

## 2016-02-07 MED ORDER — SODIUM CHLORIDE 0.9 % IV SOLN
Freq: Once | INTRAVENOUS | Status: DC | PRN
Start: 2016-02-07 — End: 2016-02-08

## 2016-02-07 MED ORDER — OXYCODONE HCL 5 MG PO TABS: 5.0000 mg | ORAL_TABLET | ORAL | Status: DC | PRN

## 2016-02-07 MED ORDER — SODIUM CHLORIDE 0.9 % IJ SOLN
10.0000 mL | INTRAMUSCULAR | Status: DC | PRN
  Administered 2016-02-07: 10 mL via INTRAVENOUS
  Filled 2016-02-07: qty 10

## 2016-02-07 MED ORDER — METHYLPREDNISOLONE SODIUM SUCC 125 MG IJ SOLR: 125.0000 mg | Freq: Once | INTRAMUSCULAR | Status: DC | PRN

## 2016-02-07 MED ORDER — DIPHENHYDRAMINE HCL 50 MG/ML IJ SOLN: 50.0000 mg | Freq: Once | INTRAMUSCULAR | Status: DC | PRN

## 2016-02-07 MED ORDER — ALBUTEROL SULFATE (2.5 MG/3ML) 0.083% IN NEBU
2.5000 mg | INHALATION_SOLUTION | Freq: Once | RESPIRATORY_TRACT | Status: DC | PRN
  Filled 2016-02-07: qty 3

## 2016-02-07 MED ORDER — HEPARIN SOD (PORK) LOCK FLUSH 100 UNIT/ML IV SOLN
500.0000 [IU] | Freq: Once | INTRAVENOUS | Status: AC | PRN
  Administered 2016-02-07: 500 [IU] via INTRAVENOUS
  Filled 2016-02-07: qty 5

## 2016-02-07 NOTE — Progress Notes (Signed)
. ID: Maria Pruitt   DOB: 08/08/1946  MR#: [id number]  NWG#:[id number]  Maria Nova, PA-C GYN:  SU:  OTHER MD:  CHIEF COMPLAINT: left adrenal metastatsis  CURRENT TREATMENT: denosumab, carboplatin  BREAST CANCER HISTORY:  from the original intake note:  Maria Pruitt herself palpated a change in her left breast, shortly before her mammogram was due.  She brought this to [name] attention and he set her up for diagnostic mammography on October 31, 2009.   Routine and implant displaced views of both breasts showed a 2 cm irregular mass in the outer left breast, seen only on the spot tangential view.  The breast tissue itself was fatty.  There were no suspicious calcifications.  This mass was palpable to Dr. [name].  An ultrasound showed it to be 1.7 cm irregular and hypoechoic.  There were at least two enlarged left axillary lymph nodes identified.  Dr. [name] recommended biopsy which was performed the same day and showed ([id number]) and invasive breast cancer felt most likely to be ductal with lobular features.  The left axillary lymph node biopsy also showed the same tumor (similar morphologic findings) and both prognostic profiles showed the cancer to be strongly ER and PR positive with a low to borderline proliferation fraction (13 and 14%).  Neither mass showed amplification of Her-2 by CISH.  (The ratios were 1.1 and 0.85.)    With this information, the patient was referred to Dr. [name] and bilateral breast MRIs were obtained March 1st.  This showed several left axillary lymph nodes with cortical thickening, the largest one measuring 1.1 cm.  The mass itself measured 2.9 cm by MRI.  It was spiculated and irregular.  There was no evidence of contralateral disease.   She was treated neoadjuvantly with docetaxel and cyclophosphamide for 6 cycles completed in July 2011, after which she underwent left lumpectomy and axillary dissection August 2011 for a ypT1b yTN3 (14 positive lymph nodes),  Stage IIIC, grade 1 residual tumor. HER-2/neu was repeated, again not amplified. After completing local regional radiation October 2011 she started letrozole November 2011, continued to September 2016, which she was found to have metastatic disease   METASTATIC DISEASE HISTORY: From the 06/08/2015 summary:   Maria Pruitt returns today for a Maria problem accompanied by her sister [name]. To summarize the recent history: She had a hemorrhagic stroke in January 2016 with some residuals. She has been living next door to her son in the [place] area since then.--In August she presented to her primary care physician, Dr. Thornton Papas, with a complaint of abdominal discomfort, nausea and vomiting. He treated her for a UTI w/o resolution and obtained a KUB in his office; this was nondiagnostic. Accordingly he set up the patient for CT scan of the abdomen and pelvis 05/06/2015. This showed the left adrenal gland to be enlarged to 7.5 x 4.0 cm. There was eccentric wall thickening of the gastric cardia. There were small lymph nodes in the upper abdomen and retroperitoneum. There was no liver involvement. The right adrenal gland was unremarkable. There was severe left kidney atrophy (congenital).  The patient was then set up for biopsy of the left adrenal mass on 05/30/2015. This showed [name] [id number]) metastatic carcinoma which was cytokeratin 7 positive, with rare cytokeratin 20 positivity and was negative for gross cystic disease fluid protein. The prognostic panel showed this tumor to be estrogen and progesterone receptor negative, HER-2 negative, with an MIB-1 of 90-100%.   PET scan10/03/2015 which showed in additional to the  left adrenal mass, some regional lymph nodes and multiple bone lesions. There was no obvious primary tumor. She also had an EGD to evaluate a possible gastric primary suggested by the yearly a CT scan of the chest. This was normal. We also obtained tumor markerswhich showed a normal CA 19,a mildly  elevated CEA at 9.7,Maria Pruitt is significantly elevated CA-27-29 at 125. Given this data, the most likely interpretation is that we are dealing with an estrogen receptor negative recurrence of her earlier estrogen receptor positive breast cancer, now stage IV  Her subsequent history is as detailed below  INTERVAL HISTORY: Maria Pruitt returns today for follow-up of her triple negative breast cancer metastatic to the adrenal gland, accompanied by her sister. Today is day 1 cycle 6 of carboplatin, now being given every 2 weeks. She has received 3 previous cycles of carboplatin, given days 1 and 8 of each 21 day cycle, with the day 8 of cycle 3 held because of a bump in her creatinine.   The interval history is remarkable for an NSTEMI. She suffered from sharp crushing sternal pain last Tuesday. She was admitted and a cardiac catheterization was performed on Wednesday. She was discharged by Thursday and is now on aspirin and plavix daily. She is in good condition today, thankful that things were not any worse.  REVIEW OF SYSTEMS: Maria Pruitt's main complaint today is still her left flank pain. It starts just under her left ribs and radiates around her flank to the same level in her midback. The pain is getting increasingly harder to control. She is reluctant to use narcotics, but she has been taking at least 74m oxycodone about 4 times daily, when her previous average was 2-3. She believes this pain is related to her left adrenal gland where there is a mass, but she wonders if some of it is constipation related. She has trouble regulating her bowels. The sorbitol given to her at the last visit caused diarrhea, but stool softeners and miralax are not quite enough to keep her regular. Occasionally she is nauseous. She denies fevers or chills. Her appetite is down. She is often fatigued. A detailed review of systems is otherwise stable.  PAST MEDICAL HISTORY: Past Medical History  Diagnosis Date  . Hypertension   .  Breast cancer ([facility] 2011    Stage 3  . Atrial fibrillation ([facility] [place]   . Hypercholesterolemia   . Abnormal ECG   . Allergy     seasonal  . Arthritis   . GERD (gastroesophageal reflux disease)   . 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 Seizures ([place]   . Stroke ([facility]     dec 15  . Thyroid disease   . Adrenal cancer ([facility]   . Fatty liver   . Influenza A    1. Significant for pituitary adenoma which was removed in 1983 but recurred, requiring radiation and briefly some Parlodel.  She has been off treatment since 1985.  She has some acromegaly secondary to this tumor.   2. She is status post bilateral submuscular breast implants under Dr. TTowanda Malkinin 1987.   3. She is status post C-section for her third child who was hydrocephalic.   4. She underwent rhinoplasty in 1984.   5. She has a history of hypertension. 6. History of mitral valve prolapse, but she says she has not been receiving antibiotics prior to dental or similar procedures.   7. History of hypothyroidism.   8. History of  congenital absence of one kidney.   9. History of hyperlipidemia.  10. History of obesity.   11. History of renal stones.   12. History of fatty liver.   13. History of gout.   14. History of palpitations.   History of childhood asthma  PAST SURGICAL HISTORY: Past Surgical History  Procedure Laterality Date  . Cesarean section  1980  . Breast enhancement surgery  1987  . Umbilical hernia repair    . Tubal ligation    . Scar revision    . Pituitary surgery      adenoma  . Rhinoplasty  1985  . Breast lumpectomy Left   . Colonoscopy      FAMILY HISTORY Family History  Problem Relation Age of Onset  . Heart disease Mother   . Heart attack Father   . Heart attack Brother   . Colon cancer Neg Hx    The patient's father died from a myocardial infarction at the age of 70.  The patient's mother died from complications of congestive heart failure at the age of 85.  The patient  had one brother who died from a myocardial infarction at the age of 68.  One of the brothers and two sisters are alive and well.  Sr. [name] is an ICU nurse. There is no history of breast or ovarian cancer in the family to her knowledge.  GYNECOLOGIC HISTORY: She is GX P3, first pregnancy to term at age 41.  She went through the change of life at the time of her pituitary surgery in 1983.  She took hormone replacement for less than a year because of poor tolerance.    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  She gave up her job in 2010/09/02.  Her first husband died from chronic myeloid leukemia in 1985.  Her three children from that marriage include her son Randall Hiss who died from complications of hydrocephaly at age 86; son [name] who is a Insurance underwriter and son [name] who is an Public house manager in this area.  The patient has five grandchildren.  Her second husband of 20+ years, Ron, is a Company secretary of an independent [employer] and also does Writer and is a Oceanographer. He is now disabled due toa traumatic brain injury. He lost his first wife to endometrial cancer.  He has a daughter from that marriage.  She lives in [place]: not in place  HEALTH MAINTENANCE: Social History  Substance Use Topics  . Smoking status: Never Smoker   . Smokeless tobacco: Never Used  . Alcohol Use: No     Colonoscopy:  PAP:  Bone density: January 2012/ osteopenia  Lipid panel:  Allergies  Allergen Reactions  . Ciprofloxacin Other (See Comments)  . Clonidine Derivatives Other (See Comments)  . Phenytoin     Other reaction(s): Other Elevated LFTs  . Sulfa Antibiotics Hives  . Benzonatate     REACTION: Swelling, tessalon perles; tolerates benadryl  . Codeine     REACTION: Nausea    Current Outpatient Prescriptions  Medication Sig Dispense Refill  . Ascorbic Acid (VITAMIN C) 100 MG tablet Take 1,000 mg by  mouth daily.     Marland Kitchen aspirin (ASPIRIN CHILDRENS) 81 MG chewable tablet Chew 1 tablet (81 mg total) by mouth daily. (Patient not taking: Reported on 12/05/2015) 100 tablet 4  . calcium carbonate (TUMS - DOSED IN MG ELEMENTAL CALCIUM) 500  MG chewable tablet Chew 1 tablet (200 mg of elemental calcium total) by mouth 3 (three) times daily. 30 tablet 0  . enoxaparin (LOVENOX) 40 MG/0.4ML injection Inject 0.4 mLs (40 mg total) into the skin daily. 30 Syringe 1  . famotidine (PEPCID) 20 MG tablet Take 1 tablet (20 mg total) by mouth 2 (two) times daily. 180 tablet 4  . hydrochlorothiazide (HYDRODIURIL) 25 MG tablet Take 1 tablet (25 mg total) by mouth daily.    Marland Kitchen lactulose (CHRONULAC) 10 GM/15ML solution Take 45 mLs (30 g total) by mouth 2 (two) times daily. (Patient not taking: Reported on 12/26/2015) 240 mL 1  . levothyroxine (SYNTHROID, LEVOTHROID) 100 MCG tablet Take 88 mcg by mouth daily before breakfast.    . loratadine (CLARITIN) 10 MG tablet Take 10 mg by mouth daily.    Marland Kitchen losartan (COZAAR) 50 MG tablet Take 50 mg by mouth daily.    . metoCLOPramide (REGLAN) 5 MG tablet Take 1 tablet (5 mg total) by mouth 3 (three) times daily as needed for nausea. (Patient not taking: Reported on 01/23/2016) 30 tablet 1  . metoprolol (LOPRESSOR) 50 MG tablet Take 50 mg by mouth 2 (two) times daily.    . ondansetron (ZOFRAN) 8 MG tablet Take 1 tablet (8 mg total) by mouth every 8 (eight) hours as needed for nausea or vomiting. 60 tablet 6  . oxyCODONE (OXY IR/ROXICODONE) 5 MG immediate release tablet Take 1 tablet (5 mg total) by mouth every 4 (four) hours as needed for severe pain. 60 tablet 0  . sorbitol 70 % solution Take 15 mLs by mouth daily as needed. (Patient not taking: Reported on 01/23/2016) 473 mL 0   Current Facility-Administered Medications  Medication Dose Route Frequency Provider Last Rate Last Dose  . 0.9 %  sodium chloride infusion   Intravenous Once PRN Laurie Panda, NP      . albuterol  (PROVENTIL) (2.5 MG/3ML) 0.083% nebulizer solution 2.5 mg  2.5 mg Nebulization Once PRN Laurie Panda, NP      . diphenhydrAMINE (BENADRYL) injection 25 mg  25 mg Intravenous Once PRN Laurie Panda, NP      . diphenhydrAMINE (BENADRYL) injection 50 mg  50 mg Intravenous Once PRN Laurie Panda, NP      . EPINEPHrine (ADRENALIN) 0.1 MG/ML injection 0.25 mg  0.25 mg Intravenous Once PRN Laurie Panda, NP      . EPINEPHrine (ADRENALIN) 0.1 MG/ML injection 0.25 mg  0.25 mg Intravenous Once PRN Laurie Panda, NP      . methylPREDNISolone sodium succinate (SOLU-MEDROL) 125 mg/2 mL injection 125 mg  125 mg Intravenous Once PRN Laurie Panda, NP      . sodium chloride 0.9 % injection 10 mL  10 mL Intravenous PRN [name], MD   10 mL at 02/07/16 1123   Facility-Administered Medications Ordered in Other Visits  Medication Dose Route Frequency Provider Last Rate Last Dose  . 0.9 %  sodium chloride infusion   Intravenous Once [name], MD        OBJECTIVE: Middle-aged white woman who appears stated age 30 Vitals:   02/07/16 1121  BP: 120/89  Pulse: 123  Resp: 18     Body mass index is 28.67 kg/(m^2).    ECOG FS: 1  Skin: warm, dry  HEENT: sclerae anicteric, conjunctivae pink, oropharynx clear. No thrush or mucositis.  Lymph Nodes: No cervical or supraclavicular lymphadenopathy  Lungs: clear to auscultation bilaterally, no  rales, wheezes, or rhonci  Heart: regular rate and rhythm  Abdomen: round, soft, non tender, positive bowel sounds  Musculoskeletal: No focal spinal tenderness, no peripheral edema  Neuro: non focal, well oriented, positive affect  Breasts: deferred  LAB RESULTS:  CBC Latest Ref Rng 02/07/2016 01/23/2016 01/09/2016  WBC 3.9 - 10.3 10e3/uL 5.9 5.2 4.3  Hemoglobin 11.6 - 15.9 g/dL 12.3 12.7 13.2  Hematocrit 34.8 - 46.6 % 34.4(L) 35.0 37.9  Platelets 145 - 400 10e3/uL 177 196 159     CMP Latest Ref Rng 02/07/2016 01/23/2016 01/09/2016   Glucose 70 - 140 mg/dl 90 89 115  BUN 7.0 - 26.0 mg/dL 4.1(L) 15.3 7.3  Creatinine 0.6 - 1.1 mg/dL 0.7 0.9 0.9  Sodium 136 - 145 mEq/L 129(L) 133(L) 132(L)  Potassium 3.5 - 5.1 mEq/L 4.0 4.3 4.2  CO2 22 - 29 mEq/L 23 28 28   Calcium 8.4 - 10.4 mg/dL 8.8 8.8 9.0  Total Protein 6.4 - 8.3 g/dL 6.5 6.7 6.6  Total Bilirubin 0.20 - 1.20 mg/dL 1.78(H) 1.66(H) 1.25(H)  Alkaline Phos 40 - 150 U/L 143 176(H) 205(H)  AST 5 - 34 U/L 19 24 24   ALT 0 - 55 U/L 11 16 15     Results for JIN, SHOCKLEY (MRN [id number]) as of 02/07/2016 12:36  Ref. Range 10/10/2015 12:56 11/07/2015 12:18 12/05/2015 08:23 01/09/2016 15:06  CA 27.29 Latest Ref Range: 0.0-38.6 U/mL 178.1 (H) 84.9 (H) 45.2 (H) 33.6    STUDIES: No results found.  ASSESSMENT: 70 y.o. Maria Pruitt, Maria Pruitt woman  (1) status post left breast biopsy in February of 2011 for an invasive lobular carcinoma measuring 2.9 cm by MRI, with a positive prechemotherapy axillary lymph node biopsy, strongly estrogen and progesterone receptor positive with no evidence of HER-2/neu amplification and an MIB-1 of 14%.   (2) received 6 cycles of docetaxel/ cyclophosphamide in the neoadjuvant setting, completed in mid July of 2011   (3) s/p left lumpectomy and axillary lymph node dissection in August of 2011 for a ypT1b yTN3 (14 positive lymph nodes), Stage IIIC, grade 1 residual tumor. HER-2/neu was repeated, again not amplified.   (4) Completed loco-regional radiation in late October of 2011   (5) on letrozole starting early November 2011, continued to October 2016, with progression  METASTATIC DISEASE: September 2016: Involving left adrenal gland, regional nodes and bones  (6) biopsy of a 7.5 cm left adrenal mass 05/30/2015 showed metastatic carcinoma, estrogen, progesterone, and HER-2 negative, with an MIB-1 of 90-100%; the tumor cells were cytokeratin 7 positive, cytokeratin 20 focally positive, gross cystic disease fluid protein negative  (a) the  CA-27-29 is informative  (b) PET scan 06/17/2015 shows, in addition to the left adrenal mass, some periaortic adenopathy and multiple hypermetabolic bone metastases (L2, T9, T2 and others)  (c) EGD on 06/16/2015 to evaluate suspicious cardiac wall thickening showed no evidence of malignancy  (7) capecitabine started 07/04/2015 at 1.5 g BID 7/7. Discontinued 08/29/15 with progression  (8) denosumab/ Xgeva started 07/18/2015, repeated monthly  (9) eribulin D1/D8 q 21 days started 09/23/14. 1 complete cycle given before hospitalization. Discontinued due to elevation in LFTs.   (10) hospitalized for infuenza A on 09/26/15. Pulmonary embolus found. Lovenox started daily.  (11) hepatic encephalopathy during hospital stay in January 2017. Lactulose completed. Awaiting approval of rifaximin due to financial strain.   (12) carboplatin d1/d8 every 21 days started 11/07/2015  (a) day 8 cycle 3 was held due to an increase in the serum creatinine  OTHER PROBLEMS: (  a) the patient is status post hemorrhagic CVA January 2016  (b) congenital absence of left kidney  (c) non alcoholic cirrhosis  (d) NSTEMI 01/31/16 treated at [facility]. Cardiac cath performed. Started on aspirin and plavix daily.   PLAN:  Given that she is just 7 days out from her NSTEMI, Dr. Jana Hakim suggests we hold treatment at this time, and resume 2 weeks from now in June. As far as her left abdominal radiating flank pain, he is not sure that this is going to be related to her left adrenal gland mass. Furthermore, this mass was shown to be shrinking with her last scan in late April. Dr. Jana Hakim suggests we reimage the area, so I have placed orders for a repeat CT scan to be performed in the next week.   The labs were reviewed in detail and are significant for a continual rise in her bilirubin for the past 4 weeks. Ger CA 27.29 has been trending downwards in the same fashion.   I refilled her prescription for oxycodone. She is  going to try sorbitol again, and I recommended she cut the dose in half.   Aileana will return in 2 weeks for cycle 9 of treatment and follow up with Dr. Jana Hakim. She understands and agrees with this plan. She knows the goal of treatment in her case is control. She has been encouraged to call with any issues that might arise before her next visit here.    Laurie Panda, NP 02/07/2016 12:25 PM    ADDENDUM: Harbour's recent cardiac event is of course of concern. I don't think her chemotherapy either precipitated it or would make it worse, but with so much going on at this point I think it is better to hold off and not treat this week. I don't expect this will make a significant difference long-term [facility] we do resume treatment in 2 weeks as planned.  Maurene was able to understand this. She will keep [place] posted on any Maria developments.  I personally saw this patient and performed a substantive portion of this encounter with the listed APP documented above.   [name], MD Medical Oncology and Hematology [facility] [address] [place], [place] [address] Tel. [phone]    Fax. [phone]

## 2016-02-07 NOTE — Telephone Encounter (Signed)
appt made and avs printed °

## 2016-02-07 NOTE — Patient Instructions (Signed)

## 2016-02-08 LAB — CANCER ANTIGEN 27.29: CA 27.29: 43.7 U/mL — ABNORMAL HIGH (ref 0.0–38.6)

## 2016-02-15 MED FILL — oxyCODONE HCL 5 MG TABS: 5 | 10 days supply | Qty: 60 | Fill #0

## 2016-02-20 ENCOUNTER — Other Ambulatory Visit (HOSPITAL_BASED_OUTPATIENT_CLINIC_OR_DEPARTMENT_OTHER): Payer: Medicare Other

## 2016-02-20 ENCOUNTER — Ambulatory Visit (HOSPITAL_BASED_OUTPATIENT_CLINIC_OR_DEPARTMENT_OTHER): Payer: Medicare Other

## 2016-02-20 ENCOUNTER — Ambulatory Visit (HOSPITAL_BASED_OUTPATIENT_CLINIC_OR_DEPARTMENT_OTHER): Payer: Medicare Other | Admitting: Oncology

## 2016-02-20 ENCOUNTER — Ambulatory Visit: Payer: Medicare Other

## 2016-02-20 VITALS — BP 117/57 | HR 72 | Temp 97.9°F | Resp 18 | Ht 62.5 in | Wt 152.2 lb

## 2016-02-20 DIAGNOSIS — C50912 Malignant neoplasm of unspecified site of left female breast: Secondary | ICD-10-CM | POA: Diagnosis not present

## 2016-02-20 DIAGNOSIS — C773 Secondary and unspecified malignant neoplasm of axilla and upper limb lymph nodes: Secondary | ICD-10-CM

## 2016-02-20 DIAGNOSIS — N179 Acute kidney failure, unspecified: Secondary | ICD-10-CM

## 2016-02-20 DIAGNOSIS — R109 Unspecified abdominal pain: Secondary | ICD-10-CM

## 2016-02-20 DIAGNOSIS — Z5111 Encounter for antineoplastic chemotherapy: Secondary | ICD-10-CM

## 2016-02-20 DIAGNOSIS — G8929 Other chronic pain: Secondary | ICD-10-CM

## 2016-02-20 DIAGNOSIS — K7469 Other cirrhosis of liver: Secondary | ICD-10-CM | POA: Diagnosis not present

## 2016-02-20 DIAGNOSIS — C7951 Secondary malignant neoplasm of bone: Secondary | ICD-10-CM | POA: Diagnosis not present

## 2016-02-20 DIAGNOSIS — C50412 Malignant neoplasm of upper-outer quadrant of left female breast: Secondary | ICD-10-CM

## 2016-02-20 DIAGNOSIS — I4891 Unspecified atrial fibrillation: Secondary | ICD-10-CM

## 2016-02-20 DIAGNOSIS — Z95828 Presence of other vascular implants and grafts: Secondary | ICD-10-CM

## 2016-02-20 DIAGNOSIS — C7972 Secondary malignant neoplasm of left adrenal gland: Secondary | ICD-10-CM | POA: Diagnosis not present

## 2016-02-20 LAB — CBC WITH DIFFERENTIAL/PLATELET
BASO%: 0.8 % (ref 0.0–2.0)
BASOS ABS: 0.1 10*3/uL (ref 0.0–0.1)
EOS%: 2.2 % (ref 0.0–7.0)
Eosinophils Absolute: 0.1 10*3/uL (ref 0.0–0.5)
HEMATOCRIT: 35.8 % (ref 34.8–46.6)
HEMOGLOBIN: 12.7 g/dL (ref 11.6–15.9)
LYMPH#: 1 10*3/uL (ref 0.9–3.3)
LYMPH%: 15 % (ref 14.0–49.7)
MCH: 35.8 pg — ABNORMAL HIGH (ref 25.1–34.0)
MCHC: 35.4 g/dL (ref 31.5–36.0)
MCV: 101.1 fL — ABNORMAL HIGH (ref 79.5–101.0)
MONO#: 0.8 10*3/uL (ref 0.1–0.9)
MONO%: 12 % (ref 0.0–14.0)
NEUT%: 70 % (ref 38.4–76.8)
NEUTROS ABS: 4.7 10*3/uL (ref 1.5–6.5)
Platelets: 226 10*3/uL (ref 145–400)
RBC: 3.54 10*6/uL — ABNORMAL LOW (ref 3.70–5.45)
RDW: 14.3 % (ref 11.2–14.5)
WBC: 6.7 10*3/uL (ref 3.9–10.3)

## 2016-02-20 LAB — COMPREHENSIVE METABOLIC PANEL
ALT: 12 U/L (ref 0–55)
ANION GAP: 9 meq/L (ref 3–11)
AST: 21 U/L (ref 5–34)
Albumin: 3.2 g/dL — ABNORMAL LOW (ref 3.5–5.0)
Alkaline Phosphatase: 148 U/L (ref 40–150)
BILIRUBIN TOTAL: 1.77 mg/dL — AB (ref 0.20–1.20)
BUN: 6.1 mg/dL — AB (ref 7.0–26.0)
CALCIUM: 8.8 mg/dL (ref 8.4–10.4)
CHLORIDE: 96 meq/L — AB (ref 98–109)
CO2: 23 mEq/L (ref 22–29)
CREATININE: 0.7 mg/dL (ref 0.6–1.1)
EGFR: >90 mL/min/{1.73_m2} (ref >90)
Glucose: 91 mg/dl (ref 70–140)
Potassium: 4.4 mEq/L (ref 3.5–5.1)
SODIUM: 127 meq/L — AB (ref 136–145)
TOTAL PROTEIN: 7 g/dL (ref 6.4–8.3)

## 2016-02-20 MED ORDER — PALONOSETRON HCL INJECTION 0.25 MG/5ML
INTRAVENOUS | Status: AC
  Filled 2016-02-20: qty 5

## 2016-02-20 MED ORDER — OXYCODONE-ACETAMINOPHEN 5-325 MG PO TABS
ORAL_TABLET | ORAL | Status: AC
  Filled 2016-02-20: qty 1

## 2016-02-20 MED ORDER — SODIUM CHLORIDE 0.9 % IV SOLN
Freq: Once | INTRAVENOUS | Status: AC
Start: 2016-02-20 — End: 2016-02-20
  Administered 2016-02-20: 15:00:00 via INTRAVENOUS

## 2016-02-20 MED ORDER — SODIUM CHLORIDE 0.9% FLUSH
10.0000 mL | INTRAVENOUS | Status: DC | PRN
  Administered 2016-02-20: 10 mL
  Filled 2016-02-20: qty 10

## 2016-02-20 MED ORDER — DOCUSATE SODIUM 250 MG PO CAPS: 250.0000 mg | ORAL_CAPSULE | Freq: Every day | ORAL | Status: DC

## 2016-02-20 MED ORDER — HEPARIN SOD (PORK) LOCK FLUSH 100 UNIT/ML IV SOLN
500.0000 [IU] | Freq: Once | INTRAVENOUS | Status: AC | PRN
  Administered 2016-02-20: 500 [IU]
  Filled 2016-02-20: qty 5

## 2016-02-20 MED ORDER — SODIUM CHLORIDE 0.9 % IV SOLN
10.0000 mg | Freq: Once | INTRAVENOUS | Status: AC
  Administered 2016-02-20: 10 mg via INTRAVENOUS
  Filled 2016-02-20: qty 1

## 2016-02-20 MED ORDER — OXYCODONE HCL 5 MG PO TABS: 5.0000 mg | ORAL_TABLET | ORAL | Status: DC | PRN

## 2016-02-20 MED ORDER — SODIUM CHLORIDE 0.9 % IV SOLN
136.5000 mg | Freq: Once | INTRAVENOUS | Status: AC
  Administered 2016-02-20: 140 mg via INTRAVENOUS
  Filled 2016-02-20: qty 14

## 2016-02-20 MED ORDER — SODIUM CHLORIDE 0.9% FLUSH
10.0000 mL | INTRAVENOUS | Status: DC | PRN
  Administered 2016-02-20: 10 mL via INTRAVENOUS
  Filled 2016-02-20: qty 10

## 2016-02-20 MED ORDER — POLYETHYLENE GLYCOL 3350 17 GM/SCOOP PO POWD: 1.0000 | Freq: Once | ORAL | Status: DC

## 2016-02-20 MED ORDER — OXYCODONE-ACETAMINOPHEN 5-325 MG PO TABS
1.0000 | ORAL_TABLET | Freq: Once | ORAL | Status: AC
  Administered 2016-02-20: 1 via ORAL

## 2016-02-20 MED ORDER — OXYCODONE-ACETAMINOPHEN 5-325 MG PO TABS
1.0000 | ORAL_TABLET | Freq: Once | ORAL | Status: DC
Start: 2016-02-20 — End: 2016-03-05

## 2016-02-20 MED ORDER — PALONOSETRON HCL INJECTION 0.25 MG/5ML
0.2500 mg | Freq: Once | INTRAVENOUS | Status: AC
  Administered 2016-02-20: 0.25 mg via INTRAVENOUS

## 2016-02-20 MED FILL — POLYETHYLENE GLYCOL 3350: 2 days supply | Qty: 255 | Fill #0

## 2016-02-20 NOTE — Patient Instructions (Signed)
Carboplatin injection  What is this medicine?  CARBOPLATIN (KAR boe pla tin) is a chemotherapy drug. It targets fast dividing cells, like cancer cells, and causes these cells to die. This medicine is used to treat ovarian cancer and many other cancers.  This medicine may be used for other purposes; ask your health care provider or pharmacist if you have questions.  What should I tell my health care provider before I take this medicine?  They need to know if you have any of these conditions:  -blood disorders  -hearing problems  -kidney disease  -recent or ongoing radiation therapy  -an unusual or allergic reaction to carboplatin, cisplatin, other chemotherapy, other medicines, foods, dyes, or preservatives  -pregnant or trying to get pregnant  -breast-feeding  How should I use this medicine?  This drug is usually given as an infusion into a vein. It is administered in a hospital or clinic by a specially trained health care professional.  Talk to your pediatrician regarding the use of this medicine in children. Special care may be needed.  Overdosage: If you think you have taken too much of this medicine contact a poison control center or emergency room at once.  NOTE: This medicine is only for you. Do not share this medicine with others.  What if I miss a dose?  It is important not to miss a dose. Call your doctor or health care professional if you are unable to keep an appointment.  What may interact with this medicine?  -medicines for seizures  -medicines to increase blood counts like filgrastim, pegfilgrastim, sargramostim  -some antibiotics like amikacin, gentamicin, neomycin, streptomycin, tobramycin  -vaccines  Talk to your doctor or health care professional before taking any of these medicines:  -acetaminophen  -aspirin  -ibuprofen  -ketoprofen  -naproxen  This list may not describe all possible interactions. Give your health care provider a list of all the medicines, herbs, non-prescription drugs, or dietary  supplements you use. Also tell them if you smoke, drink alcohol, or use illegal drugs. Some items may interact with your medicine.  What should I watch for while using this medicine?  Your condition will be monitored carefully while you are receiving this medicine. You will need important blood work done while you are taking this medicine.  This drug may make you feel generally unwell. This is not uncommon, as chemotherapy can affect healthy cells as well as cancer cells. Report any side effects. Continue your course of treatment even though you feel ill unless your doctor tells you to stop.  In some cases, you may be given additional medicines to help with side effects. Follow all directions for their use.  Call your doctor or health care professional for advice if you get a fever, chills or sore throat, or other symptoms of a cold or flu. Do not treat yourself. This drug decreases your body's ability to fight infections. Try to avoid being around people who are sick.  This medicine may increase your risk to bruise or bleed. Call your doctor or health care professional if you notice any unusual bleeding.  Be careful brushing and flossing your teeth or using a toothpick because you may get an infection or bleed more easily. If you have any dental work done, tell your dentist you are receiving this medicine.  Avoid taking products that contain aspirin, acetaminophen, ibuprofen, naproxen, or ketoprofen unless instructed by your doctor. These medicines may hide a fever.  Do not become pregnant while taking this medicine.   Women should inform their doctor if they wish to become pregnant or think they might be pregnant. There is a potential for serious side effects to an unborn child. Talk to your health care professional or pharmacist for more information. Do not breast-feed an infant while taking this medicine.  What side effects may I notice from receiving this medicine?  Side effects that you should report to your  doctor or health care professional as soon as possible:  -allergic reactions like skin rash, itching or hives, swelling of the face, lips, or tongue  -signs of infection - fever or chills, cough, sore throat, pain or difficulty passing urine  -signs of decreased platelets or bleeding - bruising, pinpoint red spots on the skin, black, tarry stools, nosebleeds  -signs of decreased red blood cells - unusually weak or tired, fainting spells, lightheadedness  -breathing problems  -changes in hearing  -changes in vision  -chest pain  -high blood pressure  -low blood counts - This drug may decrease the number of white blood cells, red blood cells and platelets. You may be at increased risk for infections and bleeding.  -nausea and vomiting  -pain, swelling, redness or irritation at the injection site  -pain, tingling, numbness in the hands or feet  -problems with balance, talking, walking  -trouble passing urine or change in the amount of urine  Side effects that usually do not require medical attention (report to your doctor or health care professional if they continue or are bothersome):  -hair loss  -loss of appetite  -metallic taste in the mouth or changes in taste  This list may not describe all possible side effects. Call your doctor for medical advice about side effects. You may report side effects to FDA at 1-800-FDA-1088.  Where should I keep my medicine?  This drug is given in a hospital or clinic and will not be stored at home.  NOTE: This sheet is a summary. It may not cover all possible information. If you have questions about this medicine, talk to your doctor, pharmacist, or health care provider.      2016, Elsevier/Gold Standard. (2007-12-02 14:38:05)

## 2016-02-20 NOTE — Progress Notes (Signed)
. ID: Maria Pruitt   DOB: 1945/09/19  MR#: [id number]  VPM#:[id number]  Maria Paris, PA-C GYN:  SU:  OTHER MD:  CHIEF COMPLAINT: left adrenal metastatsis  CURRENT TREATMENT: denosumab, carboplatin  BREAST CANCER HISTORY:  from the original intake note:  Maria Pruitt herself palpated a change in her left breast, shortly before her mammogram was due.  She brought this to Dr. Jerelene Pruitt attention and he set her up for diagnostic mammography on October 31, 2009.   Routine and implant displaced views of both breasts showed a 2 cm irregular mass in the outer left breast, seen only on the spot tangential view.  The breast tissue itself was fatty.  There were no suspicious calcifications.  This mass was palpable to Dr. Si Pruitt.  An ultrasound showed it to be 1.7 cm irregular and hypoechoic.  There were at least two enlarged left axillary lymph nodes identified.  Dr. Si Pruitt recommended biopsy which was performed the same day and showed ([id number]) and invasive breast cancer felt most likely to be ductal with lobular features.  The left axillary lymph node biopsy also showed the same tumor (similar morphologic findings) and both prognostic profiles showed the cancer to be strongly ER and PR positive with a low to borderline proliferation fraction (13 and 14%).  Neither mass showed amplification of Her-2 by CISH.  (The ratios were 1.1 and 0.85.)    With this information, the patient was referred to Dr. Jamey Pruitt and bilateral breast MRIs were obtained March 1st.  This showed several left axillary lymph nodes with cortical thickening, the largest one measuring 1.1 cm.  The mass itself measured 2.9 cm by MRI.  It was spiculated and irregular.  There was no evidence of contralateral disease.   She was treated neoadjuvantly with docetaxel and cyclophosphamide for 6 cycles completed in July 2011, after which she underwent left lumpectomy and axillary dissection August 2011 for a ypT1b yTN3 (14 positive lymph nodes),  Stage IIIC, grade 1 residual tumor. HER-2/neu was repeated, again not amplified. After completing local regional radiation October 2011 she started letrozole November 2011, continued to September 2016, which she was found to have metastatic disease   METASTATIC DISEASE HISTORY: From the 06/08/2015 summary:   Maria Pruitt returns today for a new problem accompanied by her sister Maria Pruitt. To summarize the recent history: She had a hemorrhagic stroke in January 2016 with some residuals. She has been living next door to her son in the [place] area since then.--In August she presented to her primary care physician, Dr. Tyron Pruitt, with a complaint of abdominal discomfort, nausea and vomiting. He treated her for a UTI w/o resolution and obtained a KUB in his office; this was nondiagnostic. Accordingly he set up the patient for CT scan of the abdomen and pelvis 05/06/2015. This showed the left adrenal gland to be enlarged to 7.5 x 4.0 cm. There was eccentric wall thickening of the gastric cardia. There were small lymph nodes in the upper abdomen and retroperitoneum. There was no liver involvement. The right adrenal gland was unremarkable. There was severe left kidney atrophy (congenital).  The patient was then set up for biopsy of the left adrenal mass on 05/30/2015. This showed Maria Pruitt [id number]) metastatic carcinoma which was cytokeratin 7 positive, with rare cytokeratin 20 positivity and was negative for gross cystic disease fluid protein. The prognostic panel showed this tumor to be estrogen and progesterone receptor negative, HER-2 negative, with an MIB-1 of 90-100%.   PET scan10/03/2015 which showed in additional to the  left adrenal mass, some regional lymph nodes and multiple bone lesions. There was no obvious primary tumor. She also had an EGD to evaluate a possible gastric primary suggested by the yearly a CT scan of the chest. This was normal. We also obtained tumor markerswhich showed a normal CA 19,a mildly  elevated CEA at 9.7,Maria Pruitt is significantly elevated CA-27-29 at 125. Given this data, the most likely interpretation is that we are dealing with an estrogen receptor negative recurrence of her earlier estrogen receptor positive breast cancer, now stage IV  Her subsequent history is as detailed below  INTERVAL HISTORY: Maria Pruitt returns today for follow-up of her stage IV triple negative breast cancer, accompanied by her granddaughter (who is currently a Ship broker at a [employer] in [place], Paediatric nurse education.. Today is day 1 cycle 9 of carboplatin, now being given every 2 weeks. She was supposed to have been treated last week but given her recent NSTEMI we held her treatment until today.   REVIEW OF SYSTEMS: Maria Pruitt' it is very stable as far as her heart is concerned, with no angina, shortness of breath, palpitations, or other symptoms. She will follow-up with cardiology later this week although she does not know the cardiologist. She still has pain in the left side. This is very intermittent. He can feel like it's in the back and it can feel like it's in the left lower quadrant. Some days she doesn't have it. Other days she takes 3 oxycodone.she is quite constipated from that. She is taking a stool softener but has run out of the sorbitol and did not let us know. Aside from these issues a detailed review of systems today was stable    PAST MEDICAL HISTORY: Past Medical History  Diagnosis Date  . Hypertension   . Breast cancer ([place]) 2011    Stage 3  . Atrial fibrillation ([place])   . Hypercholesterolemia   . Abnormal ECG   . Allergy     seasonal  . Arthritis   . GERD (gastroesophageal reflux disease)   . 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 Seizures ([place])   . Stroke ([place])     dec 15  . Thyroid disease   . Adrenal cancer ([place])   . Fatty liver   . Influenza A    1. Significant for pituitary adenoma which was removed in 1983 but recurred, requiring  radiation and briefly some Parlodel.  She has been off treatment since 1985.  She has some acromegaly secondary to this tumor.   2. She is status post bilateral submuscular breast implants under Dr. Towanda Malkin in 1987.   3. She is status post C-section for her third child who was hydrocephalic.   4. She underwent rhinoplasty in 1984.   5. She has a history of hypertension. 6. History of mitral valve prolapse, but she says she has not been receiving antibiotics prior to dental or similar procedures.   7. History of hypothyroidism.   8. History of congenital absence of one kidney.   9. History of hyperlipidemia.  10. History of obesity.   11. History of renal stones.   12. History of fatty liver.   13. History of gout.   14. History of palpitations.   History of childhood asthma  PAST SURGICAL HISTORY: Past Surgical History  Procedure Laterality Date  . Cesarean section  1980  . Breast enhancement surgery  1987  . Umbilical hernia repair    . Tubal  ligation    . Scar revision    . Pituitary surgery      adenoma  . Rhinoplasty  1985  . Breast lumpectomy Left   . Colonoscopy      FAMILY HISTORY Family History  Problem Relation Age of Onset  . Heart disease Mother   . Heart attack Father   . Heart attack Brother   . Colon cancer Neg Hx    The patient's father died from a myocardial infarction at the age of 83.  The patient's mother died from complications of congestive heart failure at the age of 8.  The patient had one brother who died from a myocardial infarction at the age of 56.  One of the brothers and two sisters are alive and well.  Sr. [name] is an ICU nurse. There is no history of breast or ovarian cancer in the family to her knowledge.  GYNECOLOGIC HISTORY: She is GX P3, first pregnancy to term at age 46.  She went through the change of life at the time of her pituitary surgery in 1983.  She took hormone replacement for less than a year because of poor tolerance.     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  She gave up her job in 07/31/10.  Her first husband died from chronic myeloid leukemia in 1985.  Her three children from that marriage include her son Randall Hiss who died from complications of hydrocephaly at age 81; son [name] who is a Insurance underwriter and son [name] who is an Public house manager in this area.  The patient has five grandchildren.  Her second husband of 20+ years, Ron, is a Company secretary of an independent [employer] and also does Writer and is a Oceanographer. He is now disabled due toa traumatic brain injury. He lost his first wife to endometrial cancer.  He has a daughter from that marriage.  She lives in [place]: not in place  HEALTH MAINTENANCE: Social History  Substance Use Topics  . Smoking status: Never Smoker   . Smokeless tobacco: Never Used  . Alcohol Use: No     Colonoscopy:  PAP:  Bone density: January 2012/ osteopenia  Lipid panel:  Allergies  Allergen Reactions  . Ciprofloxacin Other (See Comments)  . Clonidine Derivatives Other (See Comments)  . Phenytoin     Other reaction(s): Other Elevated LFTs  . Sulfa Antibiotics Hives  . Benzonatate     REACTION: Swelling, tessalon perles; tolerates benadryl  . Codeine     REACTION: Nausea    Current Outpatient Prescriptions  Medication Sig Dispense Refill  . Ascorbic Acid (VITAMIN C) 100 MG tablet Take 1,000 mg by mouth daily.     Marland Kitchen aspirin (ASPIRIN CHILDRENS) 81 MG chewable tablet Chew 1 tablet (81 mg total) by mouth daily. (Patient not taking: Reported on 12/05/2015) 100 tablet 4  . atorvastatin (LIPITOR) 40 MG tablet Take 40 mg by mouth.    . calcium carbonate (TUMS - DOSED IN MG ELEMENTAL CALCIUM) 500 MG chewable tablet Chew 1 tablet (200 mg of elemental calcium total) by mouth 3 (three) times daily. 30 tablet 0  . docusate sodium (COLACE) 250 MG capsule Take 1 capsule (250  mg total) by mouth daily. 10 capsule 0  . enoxaparin (LOVENOX) 40 MG/0.4ML injection Inject 0.4 mLs (40 mg total) into the skin daily. 30 Syringe 1  . famotidine (PEPCID) 20 MG tablet  Take 1 tablet (20 mg total) by mouth 2 (two) times daily. 180 tablet 4  . hydrochlorothiazide (HYDRODIURIL) 25 MG tablet Take 1 tablet (25 mg total) by mouth daily.    Marland Kitchen levothyroxine (SYNTHROID, LEVOTHROID) 100 MCG tablet Take 88 mcg by mouth daily before breakfast.    . loratadine (CLARITIN) 10 MG tablet Take 10 mg by mouth daily.    Marland Kitchen losartan (COZAAR) 50 MG tablet Take 50 mg by mouth daily.    . metoCLOPramide (REGLAN) 5 MG tablet Take 1 tablet (5 mg total) by mouth 3 (three) times daily as needed for nausea. (Patient not taking: Reported on 01/23/2016) 30 tablet 1  . metoprolol (LOPRESSOR) 50 MG tablet Take 50 mg by mouth 2 (two) times daily.    . ondansetron (ZOFRAN) 8 MG tablet Take 1 tablet (8 mg total) by mouth every 8 (eight) hours as needed for nausea or vomiting. 60 tablet 6  . oxyCODONE (OXY IR/ROXICODONE) 5 MG immediate release tablet Take 1 tablet (5 mg total) by mouth every 4 (four) hours as needed for severe pain. 60 tablet 0  . polyethylene glycol powder (MIRALAX) powder Take 255 g by mouth once. 255 g 0   No current facility-administered medications for this visit.   Facility-Administered Medications Ordered in Other Visits  Medication Dose Route Frequency Provider Last Rate Last Dose  . 0.9 %  sodium chloride infusion   Intravenous Once [name], MD      . sodium chloride flush (NS) 0.9 % injection 10 mL  10 mL Intravenous PRN Nicholas Lose, MD   10 mL at 02/20/16 1231    OBJECTIVE: Middle-aged white woman in no acute distress Filed Vitals:   02/20/16 1247  BP: 117/57  Pulse: 72  Temp: 97.9 F (36.6 C)  Resp: 18     Body mass index is 27.38 kg/(m^2).    ECOG FS: 1  Sclerae unicteric, pupils round and equal Oropharynx clear and moist-- no thrush or other lesions No cervical or  supraclavicular adenopathy Lungs no rales or rhonchi Heart regular rate and rhythm Abd soft, nontender, positive bowel sounds MSK no focal spinal tenderness, no upper extremity lymphedema Neuro: nonfocal, well oriented, appropriate affect Breasts: Deferred   LAB RESULTS:  CBC Latest Ref Rng 02/20/2016 02/07/2016 01/23/2016  WBC 3.9 - 10.3 10e3/uL 6.7 5.9 5.2  Hemoglobin 11.6 - 15.9 g/dL 12.7 12.3 12.7  Hematocrit 34.8 - 46.6 % 35.8 34.4(L) 35.0  Platelets 145 - 400 10e3/uL 226 177 196     CMP Latest Ref Rng 02/20/2016 02/07/2016 01/23/2016  Glucose 70 - 140 mg/dl 91 90 89  BUN 7.0 - 26.0 mg/dL 6.1(L) 4.1(L) 15.3  Creatinine 0.6 - 1.1 mg/dL 0.7 0.7 0.9  Sodium 136 - 145 mEq/L 127(L) 129(L) 133(L)  Potassium 3.5 - 5.1 mEq/L 4.4 4.0 4.3  CO2 22 - 29 mEq/L _0 Calcium 8.4 - 10.4 mg/dL 8.8 8.8 8.8  Total Protein 6.4 - 8.3 g/dL 7.0 6.5 6.7  Total Bilirubin 0.20 - 1.20 mg/dL 1.77(H) 1.78(H) 1.66(H)  Alkaline Phos 40 - 150 U/L 148 143 176(H)  AST 5 - 34 U/L _1 ALT 0 - 55 U/L _2 Results for Maria Pruitt, Maria Pruitt (MRN [id number]) as of 02/07/2016 12:36  Ref. Range 10/10/2015 12:56 11/07/2015 12:18 12/05/2015 08:23 01/09/2016 15:06  CA 27.29 Latest Ref Range: 0.0-38.6 U/mL 178.1 (H) 84.9 (H) 45.2 (H) 33.6    STUDIES: Restaging CT scans scheduled for  02/23/2016  ASSESSMENT: 70 y.o. [place], [place] woman  (1) status post left breast biopsy in February of 2011 for an invasive lobular carcinoma measuring 2.9 cm by MRI, with a positive prechemotherapy axillary lymph node biopsy, strongly estrogen and progesterone receptor positive with no evidence of HER-2/neu amplification and an MIB-1 of 14%.   (2) received 6 cycles of docetaxel/ cyclophosphamide in the neoadjuvant setting, completed in mid July of 2011   (3) s/p left lumpectomy and axillary lymph node dissection in August of 2011 for a ypT1b yTN3 (14 positive lymph nodes), Stage IIIC, grade 1 residual tumor.  HER-2/neu was repeated, again not amplified.   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 Discontinued 08/29/15 with progression  (8) denosumab/ Xgeva started 07/18/2015, repeated monthly  (9) eribulin D1/D8 q 21 days started 09/23/14. 1 complete cycle given before hospitalization. Discontinued due to elevation in LFTs.   (10) hospitalized for infuenza A on 09/26/15. Pulmonary embolus found. Lovenox started daily.  (11) hepatic encephalopathy during hospital stay in January 2017. Lactulose completed. Awaiting approval of rifaximin due to financial strain.   (12) carboplatin d1/d8 every 21 days started 11/07/2015  (a) day 8 cycle 3 was held due to an increase in the serum creatinine  OTHER PROBLEMS: (a) the patient is status post hemorrhagic CVA January 2016  (b) congenital absence of left kidney  (c) non alcoholic cirrhosis  (d) NSTEMI 01/31/16 treated at [facility]. Cardiac cath performed. Started on aspirin and plavix daily.   PLAN:  Maria Pruitt is doing well as far as her heart attack is concerned. She has follow-up with cardiology later this week.  She is  tolerating the current chemotherapy well and her CA-27-29 is stable.  She should have had a CT of the abdomen prior to this visit but for some reason that could not be scheduled. It will be done this Thursday, 3 days from now. I will call her with those results.  Meanwhile we are continuing chemotherapy as previously planned. She will be treated today and then again 2 weeks from today. If there is evidence of disease progression we will consider switching her to gemcitabine.   [name], MD 02/20/2016 1:22 PM   Medical Oncology and Hematology [facility] [address] [place], [place] [address] Tel. [phone]    Fax. [phone]

## 2016-02-22 ENCOUNTER — Telehealth: Payer: Self-pay | Admitting: *Deleted

## 2016-02-22 NOTE — Telephone Encounter (Signed)
Message left at 423 pm by pt's sister stating " Yaneli is scheduled for a CT of her abd tomorrow and she has a bottle of miralax but the instructions I have do not mention any miralax. " " could someone call me and tell me how she is to use the miralax "  Return call number given as [phone].  This RN returned call and obtained an identified VM for [place]. Message left informed her miralax is not to be used for above scan- pt should have a bottle of contrast. This RN also left the main number for radiology for [facility] to contact them for instuctions.

## 2016-02-23 ENCOUNTER — Encounter (HOSPITAL_COMMUNITY): Payer: Self-pay

## 2016-02-23 ENCOUNTER — Ambulatory Visit (HOSPITAL_COMMUNITY)
Admission: RE | Admit: 2016-02-23 | Discharge: 2016-02-23 | Disposition: A | Discharge status: Home / Self Care | Payer: Medicare Other | Source: Ambulatory Visit | Attending: Nurse Practitioner | Admitting: Nurse Practitioner

## 2016-02-23 DIAGNOSIS — I7090 Unspecified atherosclerosis: Secondary | ICD-10-CM | POA: Diagnosis not present

## 2016-02-23 DIAGNOSIS — C50912 Malignant neoplasm of unspecified site of left female breast: Secondary | ICD-10-CM | POA: Diagnosis present

## 2016-02-23 DIAGNOSIS — K8020 Calculus of gallbladder without cholecystitis without obstruction: Secondary | ICD-10-CM | POA: Insufficient documentation

## 2016-02-23 DIAGNOSIS — C772 Secondary and unspecified malignant neoplasm of intra-abdominal lymph nodes: Secondary | ICD-10-CM | POA: Insufficient documentation

## 2016-02-23 DIAGNOSIS — C7951 Secondary malignant neoplasm of bone: Secondary | ICD-10-CM | POA: Insufficient documentation

## 2016-02-23 DIAGNOSIS — R932 Abnormal findings on diagnostic imaging of liver and biliary tract: Secondary | ICD-10-CM | POA: Diagnosis not present

## 2016-02-23 DIAGNOSIS — C7972 Secondary malignant neoplasm of left adrenal gland: Secondary | ICD-10-CM | POA: Diagnosis not present

## 2016-02-23 DIAGNOSIS — I2510 Atherosclerotic heart disease of native coronary artery without angina pectoris: Secondary | ICD-10-CM | POA: Insufficient documentation

## 2016-02-23 MED ORDER — IOPAMIDOL (ISOVUE-300) INJECTION 61%
100.0000 mL | Freq: Once | INTRAVENOUS | Status: AC | PRN
  Administered 2016-02-23: 100 mL via INTRAVENOUS

## 2016-03-05 ENCOUNTER — Other Ambulatory Visit (HOSPITAL_BASED_OUTPATIENT_CLINIC_OR_DEPARTMENT_OTHER): Payer: Medicare Other

## 2016-03-05 ENCOUNTER — Ambulatory Visit (HOSPITAL_BASED_OUTPATIENT_CLINIC_OR_DEPARTMENT_OTHER): Payer: Medicare Other

## 2016-03-05 ENCOUNTER — Ambulatory Visit (HOSPITAL_BASED_OUTPATIENT_CLINIC_OR_DEPARTMENT_OTHER): Payer: Medicare Other | Admitting: Oncology

## 2016-03-05 ENCOUNTER — Other Ambulatory Visit: Payer: Self-pay | Admitting: *Deleted

## 2016-03-05 ENCOUNTER — Ambulatory Visit: Payer: Medicare Other

## 2016-03-05 VITALS — BP 124/91 | HR 79 | Temp 97.9°F | Resp 18

## 2016-03-05 DIAGNOSIS — R52 Pain, unspecified: Secondary | ICD-10-CM | POA: Insufficient documentation

## 2016-03-05 DIAGNOSIS — C50812 Malignant neoplasm of overlapping sites of left female breast: Secondary | ICD-10-CM

## 2016-03-05 DIAGNOSIS — C7972 Secondary malignant neoplasm of left adrenal gland: Secondary | ICD-10-CM

## 2016-03-05 DIAGNOSIS — C50412 Malignant neoplasm of upper-outer quadrant of left female breast: Secondary | ICD-10-CM

## 2016-03-05 DIAGNOSIS — G893 Neoplasm related pain (acute) (chronic): Secondary | ICD-10-CM | POA: Diagnosis not present

## 2016-03-05 DIAGNOSIS — C50912 Malignant neoplasm of unspecified site of left female breast: Secondary | ICD-10-CM

## 2016-03-05 DIAGNOSIS — K7469 Other cirrhosis of liver: Secondary | ICD-10-CM

## 2016-03-05 DIAGNOSIS — Z171 Estrogen receptor negative status [ER-]: Secondary | ICD-10-CM

## 2016-03-05 DIAGNOSIS — C773 Secondary and unspecified malignant neoplasm of axilla and upper limb lymph nodes: Secondary | ICD-10-CM

## 2016-03-05 DIAGNOSIS — Z95828 Presence of other vascular implants and grafts: Secondary | ICD-10-CM

## 2016-03-05 DIAGNOSIS — C7951 Secondary malignant neoplasm of bone: Principal | ICD-10-CM

## 2016-03-05 DIAGNOSIS — E278 Other specified disorders of adrenal gland: Secondary | ICD-10-CM

## 2016-03-05 LAB — CBC WITH DIFFERENTIAL/PLATELET
BASO%: 0.6 % (ref 0.0–2.0)
Basophils Absolute: 0 10*3/uL (ref 0.0–0.1)
EOS%: 0.6 % (ref 0.0–7.0)
Eosinophils Absolute: 0.1 10*3/uL (ref 0.0–0.5)
HEMATOCRIT: 36 % (ref 34.8–46.6)
HGB: 12.7 g/dL (ref 11.6–15.9)
LYMPH#: 0.6 10*3/uL — AB (ref 0.9–3.3)
LYMPH%: 8.3 % — AB (ref 14.0–49.7)
MCH: 35.3 pg — ABNORMAL HIGH (ref 25.1–34.0)
MCHC: 35.3 g/dL (ref 31.5–36.0)
MCV: 100 fL (ref 79.5–101.0)
MONO#: 0.8 10*3/uL (ref 0.1–0.9)
MONO%: 10.5 % (ref 0.0–14.0)
NEUT#: 6.2 10*3/uL (ref 1.5–6.5)
NEUT%: 80 % — AB (ref 38.4–76.8)
Platelets: 285 10*3/uL (ref 145–400)
RBC: 3.6 10*6/uL — ABNORMAL LOW (ref 3.70–5.45)
RDW: 14.2 % (ref 11.2–14.5)
WBC: 7.8 10*3/uL (ref 3.9–10.3)

## 2016-03-05 LAB — COMPREHENSIVE METABOLIC PANEL
ALBUMIN: 3 g/dL — AB (ref 3.5–5.0)
ALK PHOS: 148 U/L (ref 40–150)
ALT: 16 U/L (ref 0–55)
ANION GAP: 10 meq/L (ref 3–11)
AST: 18 U/L (ref 5–34)
BUN: 7.9 mg/dL (ref 7.0–26.0)
CALCIUM: 9.3 mg/dL (ref 8.4–10.4)
CHLORIDE: 92 meq/L — AB (ref 98–109)
CO2: 23 mEq/L (ref 22–29)
CREATININE: 0.7 mg/dL (ref 0.6–1.1)
EGFR: >90 mL/min/{1.73_m2} (ref >90)
Glucose: 133 mg/dl (ref 70–140)
Potassium: 4.1 mEq/L (ref 3.5–5.1)
Sodium: 125 mEq/L — ABNORMAL LOW (ref 136–145)
Total Bilirubin: 1.69 mg/dL — ABNORMAL HIGH (ref 0.20–1.20)
Total Protein: 7.1 g/dL (ref 6.4–8.3)

## 2016-03-05 MED ORDER — SODIUM CHLORIDE 0.9% FLUSH
10.0000 mL | INTRAVENOUS | Status: DC | PRN
  Administered 2016-03-05: 10 mL
  Filled 2016-03-05: qty 10

## 2016-03-05 MED ORDER — SODIUM CHLORIDE 0.9 % IV SOLN
INTRAVENOUS | Status: DC
  Administered 2016-03-05: 13:00:00 via INTRAVENOUS

## 2016-03-05 MED ORDER — MORPHINE SULFATE 15 MG PO TABS: 15.0000 mg | ORAL_TABLET | ORAL | Status: DC | PRN

## 2016-03-05 MED ORDER — HYDROMORPHONE HCL 1 MG/ML IJ SOLN
1.0000 mg | Freq: Once | INTRAMUSCULAR | Status: AC
  Administered 2016-03-05: 1 mg via INTRAVENOUS
  Filled 2016-03-05: qty 1

## 2016-03-05 MED ORDER — MORPHINE SULFATE ER 15 MG PO TBCR: EXTENDED_RELEASE_TABLET | ORAL | Status: DC

## 2016-03-05 MED ORDER — SODIUM CHLORIDE 0.9 % IJ SOLN
10.0000 mL | INTRAMUSCULAR | Status: DC | PRN
  Administered 2016-03-05: 10 mL via INTRAVENOUS
  Filled 2016-03-05: qty 10

## 2016-03-05 MED ORDER — HYDROMORPHONE HCL 4 MG/ML IJ SOLN
INTRAMUSCULAR | Status: AC
  Filled 2016-03-05: qty 1

## 2016-03-05 MED ORDER — HEPARIN SOD (PORK) LOCK FLUSH 100 UNIT/ML IV SOLN
500.0000 [IU] | Freq: Once | INTRAVENOUS | Status: AC | PRN
  Administered 2016-03-05: 500 [IU]
  Filled 2016-03-05: qty 5

## 2016-03-05 MED FILL — MORPHINE SULF ER 15 MG TAB: 15 | 30 days supply | Qty: 120 | Fill #0

## 2016-03-05 MED FILL — MORPHINE SULFATE IR 15 MG T: 15 | 7 days supply | Qty: 60 | Fill #0

## 2016-03-05 NOTE — Progress Notes (Signed)
Pt arrived to infusion area via w/c. In obvious distress-rates abdominal pain @ 6-7 /10. Has just taken  5mg  Oxycodone.  Pt and daughter state that pain has gotten worse and current meds are not helping as well as they had in the past. Pt and daughter requesting to see Dr. Jana Hakim for pain issues as well as to review scans from 02/23/16. Pt with progressive disease.  Reviewed labs. N+ 125; Total Bili 1.69  Spoke to Dr. Virgie Dad nurse, Val. She will have Dr. Jana Hakim come to see pt in infusion area. Also order placed for Normal Saline IVFs @ 500cc/hour for 1 liter. Chemo to be held today.  Ptgiven Dilaudid 1mg  IV push per order Dr. Jana Hakim.

## 2016-03-05 NOTE — Progress Notes (Signed)
. ID: Maria Pruitt   DOB: 11-16-1945  MR#: [id number]  EXB#:[id number]  Maria Nova, PA-C GYN:  SU:  OTHER MD:  CHIEF COMPLAINT: left adrenal metastatsis  CURRENT TREATMENT: denosumab,  Considering palliative radiation  BREAST CANCER HISTORY:  from the original intake note:  Maria Pruitt herself palpated a change in her left breast, shortly before her mammogram was due.  She brought this to Maria Pruitt attention and he set her up for diagnostic mammography on October 31, 2009.   Routine and implant displaced views of both breasts showed a 2 cm irregular mass in the outer left breast, seen only on the spot tangential view.  The breast tissue itself was fatty.  There were no suspicious calcifications.  This mass was palpable to Dr. Melanee Pruitt.  An ultrasound showed it to be 1.7 cm irregular and hypoechoic.  There were at least two enlarged left axillary lymph nodes identified.  Dr. Melanee Pruitt recommended biopsy which was performed the same day and showed ([id number]) and invasive breast cancer felt most likely to be ductal with lobular features.  The left axillary lymph node biopsy also showed the same tumor (similar morphologic findings) and both prognostic profiles showed the cancer to be strongly ER and PR positive with a low to borderline proliferation fraction (13 and 14%).  Neither mass showed amplification of Her-2 by CISH.  (The ratios were 1.1 and 0.85.)    With this information, the patient was referred to Dr. Margot Pruitt and bilateral breast MRIs were obtained March 1st.  This showed several left axillary lymph nodes with cortical thickening, the largest one measuring 1.1 cm.  The mass itself measured 2.9 cm by MRI.  It was spiculated and irregular.  There was no evidence of contralateral disease.   She was treated neoadjuvantly with docetaxel and cyclophosphamide for 6 cycles completed in July 2011, after which she underwent left lumpectomy and axillary dissection August 2011 for a ypT1b yTN3 (14  positive lymph nodes), Stage IIIC, grade 1 residual tumor. HER-2/neu was repeated, again not amplified. After completing local regional radiation October 2011 she started letrozole November 2011, continued to September 2016, which she was found to have metastatic disease   METASTATIC DISEASE HISTORY: From the 06/08/2015 summary:   Maria Pruitt returns today for a new problem accompanied by her sister Maria Pruitt. To summarize the recent history: She had a hemorrhagic stroke in January 2016 with some residuals. She has been living next door to her son in the [place] area since then.--In August she presented to her primary care physician, Maria Pruitt, with a complaint of abdominal discomfort, nausea and vomiting. He treated her for a UTI w/o resolution and obtained a KUB in his office; this was nondiagnostic. Accordingly he set up the patient for CT scan of the abdomen and pelvis 05/06/2015. This showed the left adrenal gland to be enlarged to 7.5 x 4.0 cm. There was eccentric wall thickening of the gastric cardia. There were small lymph nodes in the upper abdomen and retroperitoneum. There was no liver involvement. The right adrenal gland was unremarkable. There was severe left kidney atrophy (congenital).  The patient was then set up for biopsy of the left adrenal mass on 05/30/2015. This showed Maria Pruitt [id number]) metastatic carcinoma which was cytokeratin 7 positive, with rare cytokeratin 20 positivity and was negative for gross cystic disease fluid protein. The prognostic panel showed this tumor to be estrogen and progesterone receptor negative, HER-2 negative, with an MIB-1 of 90-100%.   PET scan10/03/2015 which showed in  additional to the left adrenal mass, some regional lymph nodes and multiple bone lesions. There was no obvious primary tumor. She also had an EGD to evaluate a possible gastric primary suggested by the yearly a CT scan of the chest. This was normal. We also obtained tumor markerswhich showed a  normal CA 19,a mildly elevated CEA at 9.7,Maria Pruitt is significantly elevated CA-27-29 at 125. Given this data, the most likely interpretation is that we are dealing with an estrogen receptor negative recurrence of her earlier estrogen receptor positive breast cancer, now stage IV  Her subsequent history is as detailed below  INTERVAL HISTORY: Maria Pruitt returns today for follow-up of her triple negative breast cancer, accompanied by her daughter.  Today is day 1 cycle 10 of carboplatin, now being given every 2 weeks. -- However since her last visit here we obtained restaging CT scans of the chest, abdomen and pelvis and this shows disease progression, specifically the left periaortic renal mass, which is the likely source of her pain, now measures 4.3 cm as compared to 2.4 cm in April of this year. Accordingly we are discontinuing the carboplatin and reassess   REVIEW OF SYSTEMS: [name] tells me her pain has not been controlled for several days. She has been taking hydrocodone APAP 2 or 3 times in the morning and at least twice in the afternoon, with no significant relief. Despite the narcotics she is not constipated. She is taking 2 stool softeners twice a day. She has not started her MiraLAX. She has not been confused. She is not nauseated. There have not been any unusual headaches Or visual changes. The rest of the detailed review of systems today was stable   PAST MEDICAL HISTORY: Past Medical History  Diagnosis Date  . Hypertension   . Breast cancer ([place]) 2011    Stage 3  . Atrial fibrillation ([place])   . Hypercholesterolemia   . Abnormal ECG   . Allergy     seasonal  . Arthritis   . GERD (gastroesophageal reflux disease)   . 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 Seizures ([place])   . Stroke ([place])     dec 15  . Thyroid disease   . Adrenal cancer ([place])   . Fatty liver   . Influenza A    1. Significant for pituitary adenoma which was removed in 1983 but recurred,  requiring radiation and briefly some Parlodel.  She has been off treatment since 1985.  She has some acromegaly secondary to this tumor.   2. She is status post bilateral submuscular breast implants under Dr. Towanda Malkin in 1987.   3. She is status post C-section for her third child who was hydrocephalic.   4. She underwent rhinoplasty in 1984.   5. She has a history of hypertension. 6. History of mitral valve prolapse, but she says she has not been receiving antibiotics prior to dental or similar procedures.   7. History of hypothyroidism.   8. History of congenital absence of one kidney.   9. History of hyperlipidemia.  10. History of obesity.   11. History of renal stones.   12. History of fatty liver.   13. History of gout.   14. History of palpitations.   History of childhood asthma  PAST SURGICAL HISTORY: Past Surgical History  Procedure Laterality Date  . Cesarean section  1980  . Breast enhancement surgery  1987  . Umbilical hernia repair    . Tubal ligation    .  Scar revision    . Pituitary surgery      adenoma  . Rhinoplasty  1985  . Breast lumpectomy Left   . Colonoscopy      FAMILY HISTORY Family History  Problem Relation Age of Onset  . Heart disease Mother   . Heart attack Father   . Heart attack Brother   . Colon cancer Neg Hx    The patient's father died from a myocardial infarction at the age of 19.  The patient's mother died from complications of congestive heart failure at the age of 65.  The patient had one brother who died from a myocardial infarction at the age of 36.  One of the brothers and two sisters are alive and well.  Sr. Maria Pruitt is an ICU nurse. There is no history of breast or ovarian cancer in the family to her knowledge.  GYNECOLOGIC HISTORY: She is GX P3, first pregnancy to term at age 68.  She went through the change of life at the time of her pituitary surgery in 1983.  She took hormone replacement for less than a year because of poor  tolerance.    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  She gave up her job in 08/09/2010.  Her first husband died from chronic myeloid leukemia in 1985.  Her three children from that marriage include her son Randall Hiss who died from complications of hydrocephaly at age 73; son [name] who is a Insurance underwriter and son [name] who is an Public house manager in this area.  The patient has five grandchildren.  Her second husband of 20+ years, Ron, is a Company secretary of an independent [employer] and also does Writer and is a Oceanographer. He is now disabled due toa traumatic brain injury. He lost his first wife to endometrial cancer.  He has a daughter from that marriage.  She lives in [place]: not in place  HEALTH MAINTENANCE: Social History  Substance Use Topics  . Smoking status: Never Smoker   . Smokeless tobacco: Never Used  . Alcohol Use: No     Colonoscopy:  PAP:  Bone density: January 2012/ osteopenia  Lipid panel:  Allergies  Allergen Reactions  . Ciprofloxacin Other (See Comments)  . Clonidine Derivatives Other (See Comments)  . Phenytoin     Other reaction(s): Other Elevated LFTs  . Sulfa Antibiotics Hives  . Benzonatate     REACTION: Swelling, tessalon perles; tolerates benadryl  . Codeine     REACTION: Nausea    Current Outpatient Prescriptions  Medication Sig Dispense Refill  . Ascorbic Acid (VITAMIN C) 100 MG tablet Take 1,000 mg by mouth daily.     Marland Kitchen aspirin (ASPIRIN CHILDRENS) 81 MG chewable tablet Chew 1 tablet (81 mg total) by mouth daily. (Patient not taking: Reported on 12/05/2015) 100 tablet 4  . atorvastatin (LIPITOR) 40 MG tablet Take 40 mg by mouth.    . calcium carbonate (TUMS - DOSED IN MG ELEMENTAL CALCIUM) 500 MG chewable tablet Chew 1 tablet (200 mg of elemental calcium total) by mouth 3 (three) times daily. 30 tablet 0  . docusate sodium (COLACE) 250 MG capsule Take 1  capsule (250 mg total) by mouth daily. 10 capsule 0  . enoxaparin (LOVENOX) 40 MG/0.4ML injection Inject 0.4 mLs (40 mg total) into the skin daily. 30 Syringe 1  . famotidine (PEPCID) 20 MG tablet Take 1 tablet (20 mg  total) by mouth 2 (two) times daily. 180 tablet 4  . hydrochlorothiazide (HYDRODIURIL) 25 MG tablet Take 1 tablet (25 mg total) by mouth daily.    Marland Kitchen levothyroxine (SYNTHROID, LEVOTHROID) 100 MCG tablet Take 88 mcg by mouth daily before breakfast.    . loratadine (CLARITIN) 10 MG tablet Take 10 mg by mouth daily.    Marland Kitchen losartan (COZAAR) 50 MG tablet Take 50 mg by mouth daily.    . metoCLOPramide (REGLAN) 5 MG tablet Take 1 tablet (5 mg total) by mouth 3 (three) times daily as needed for nausea. (Patient not taking: Reported on 01/23/2016) 30 tablet 1  . metoprolol (LOPRESSOR) 50 MG tablet Take 50 mg by mouth 2 (two) times daily.    . ondansetron (ZOFRAN) 8 MG tablet Take 1 tablet (8 mg total) by mouth every 8 (eight) hours as needed for nausea or vomiting. 60 tablet 6  . oxyCODONE (OXY IR/ROXICODONE) 5 MG immediate release tablet Take 1 tablet (5 mg total) by mouth every 4 (four) hours as needed for severe pain. 60 tablet 0  . oxyCODONE-acetaminophen (PERCOCET/ROXICET) 5-325 MG tablet Take 1 tablet by mouth once. 30 tablet 0  . polyethylene glycol powder (MIRALAX) powder Take 255 g by mouth once. 255 g 0   No current facility-administered medications for this visit.   Facility-Administered Medications Ordered in Other Visits  Medication Dose Route Frequency Provider Last Rate Last Dose  . 0.9 %  sodium chloride infusion   Intravenous Once Maria Cruel, MD      . 0.9 %  sodium chloride infusion   Intravenous Continuous Maria Cruel, MD 500 mL/hr at 03/05/16 1246    . sodium chloride 0.9 % injection 10 mL  10 mL Intravenous PRN Maria Cruel, MD   10 mL at 03/05/16 1138    OBJECTIVE: Middle-aged white woman examined in the infusion area  There were no vitals filed for  this visit.   There is no weight on file to calculate BMI.    ECOG FS: 1 Vitals - 1 value per visit 1/74/9449  SYSTOLIC 675  DIASTOLIC 91  Pulse 79  Temperature 97.9  Respirations 18  Weight (lb)   Height   BMI   VISIT REPORT    Sclerae unicteric, EOMs intact No cervical or supraclavicular adenopathy Lungs no rales orwheezes, auscultated anterolaterally Heart regular rate and rhythm Abd soft, positive bowel sounds Neuro: nonfocal, well oriented, appropriate affect Breasts: deferred   LAB RESULTS:  CBC Latest Ref Rng 03/05/2016 02/20/2016 02/07/2016  WBC 3.9 - 10.3 10e3/uL 7.8 6.7 5.9  Hemoglobin 11.6 - 15.9 g/dL 12.7 12.7 12.3  Hematocrit 34.8 - 46.6 % 36.0 35.8 34.4(L)  Platelets 145 - 400 10e3/uL 285 226 177     CMP Latest Ref Rng 03/05/2016 02/20/2016 02/07/2016  Glucose 70 - 140 mg/dl 133 91 90  BUN 7.0 - 26.0 mg/dL 7.9 6.1(L) 4.1(L)  Creatinine 0.6 - 1.1 mg/dL 0.7 0.7 0.7  Sodium 136 - 145 mEq/L 125(L) 127(L) 129(L)  Potassium 3.5 - 5.1 mEq/L 4.1 4.4 4.0  CO2 22 - 29 mEq/L _0 Calcium 8.4 - 10.4 mg/dL 9.3 8.8 8.8  Total Protein 6.4 - 8.3 g/dL 7.1 7.0 6.5  Total Bilirubin 0.20 - 1.20 mg/dL 1.69(H) 1.77(H) 1.78(H)  Alkaline Phos 40 - 150 U/L 148 148 143  AST 5 - 34 U/L _1 ALT 0 - 55 U/L _2 Results for Maria Pruitt, Maria Pruitt (MRN  [id number]) as of 02/07/2016 12:36  Ref. Range 10/10/2015 12:56 11/07/2015 12:18 12/05/2015 08:23 01/09/2016 15:06  CA 27.29 Latest Ref Range: 0.0-38.6 U/mL 178.1 (H) 84.9 (H) 45.2 (H) 33.6    STUDIES: Ct Chest W Contrast  02/23/2016  CLINICAL DATA:  70 year old female with history of right-sided breast cancer with metastatic disease to the left adrenal gland and bones. Chemotherapy ongoing. Restaging examination. Chronic left-sided back pain for the past year. EXAM: CT CHEST, ABDOMEN, AND PELVIS WITH CONTRAST TECHNIQUE: Multidetector CT imaging of the chest, abdomen and pelvis was performed following the standard protocol during  bolus administration of intravenous contrast. CONTRAST:  171m ISOVUE-300 IOPAMIDOL (ISOVUE-300) INJECTION 61% COMPARISON:  Chest CT 01/02/2016. MRI of the abdomen 10/02/2015. PET-CT 06/17/2015. Multiple other prior examinations. FINDINGS: CT CHEST FINDINGS Mediastinum/Lymph Nodes: Heart size is normal. There is no significant pericardial fluid, thickening or pericardial calcification. There is atherosclerosis of the thoracic aorta, the great vessels of the mediastinum and the coronary arteries, including calcified atherosclerotic plaque in the left anterior descending, left circumflex and right coronary arteries. No pathologically enlarged mediastinal or hilar lymph nodes. Small hiatal hernia. No axillary lymphadenopathy. Surgical clips in left axilla related to prior left axillary lymph node dissection. Right internal jugular single-lumen porta cath with tip terminating in the distal superior vena cava. Lungs/Pleura: New 6 x 4 mm (mean diameter of 5 mm) subpleural nodule in the medial aspect of the left upper lobe (image 43 of series 4) is nonspecific, but favored to represent a subpleural lymph node. 6 mm nodule in the left lower lobe (image 126 of series 4) is unchanged compared to prior study 12/08/2009, considered benign. Likewise, 4 mm nodules in the right lower lobe (image 82 of series 4) and in the right upper lobe (image 37 of series 4) are unchanged compared to 12/08/2009, also benign. No other new suspicious appearing pulmonary nodules or masses are noted. There is no acute consolidative airspace disease. No pleural effusions. Septal thickening, ground-glass attenuation, architectural distortion and mild cylindrical bronchiectasis in the left upper lobe deep to the left axilla is similar to prior studies, most compatible with evolving postradiation changes. Musculoskeletal/Soft Tissues: Mixed lucent and sclerotic lesion again noted in T9 vertebral body measuring up to 1.6 cm in diameter (sagittal image  119 of series 602). Old nondisplaced fractures of the posterior aspect of the left tenth and eleventh ribs are again noted. Bilateral breast implants are noted. Extensive capsular calcification. Skin thickening and stranding throughout the soft tissues of the left breast, presumably from prior radiation therapy. CT ABDOMEN AND PELVIS FINDINGS Hepatobiliary: The liver has a shrunken appearance and nodular contour, compatible with underlying cirrhosis. No cystic or solid hepatic lesions. No intra or extrahepatic biliary ductal dilatation. Multiple tiny calcified gallstones lie dependently in the gallbladder. No findings to suggest an acute cholecystitis at this time. Pancreas: No pancreatic mass. No pancreatic ductal dilatation. No pancreatic or peripancreatic fluid or inflammatory changes. Spleen: Unremarkable. Adrenals/Urinary Tract: Right adrenal gland is normal. Left adrenal nodule (likely a metastasis) has slightly increased in size compared to the prior study, currently measuring 2.2 x 1.4 cm (image 16 of series 5), previously 2.0 x 1.0 cm on prior chest CT 01/02/2016. Severe atrophy of the new left native kidney again noted. Right kidney is normal in appearance. No hydroureteronephrosis. Urinary bladder is normal in appearance. Stomach/Bowel: Normal appearance of the stomach. There is no pathologic dilatation of small bowel or colon. Normal appendix. Vascular/Lymphatic: Atherosclerosis throughout the abdominal and pelvic vasculature, without  evidence of aneurysm or dissection. Further enlargement of the previously demonstrated left periaortic nodal mass which currently measures 4.3 x 3.4 cm (axial image 62 of series 2), previously 2.4 x 2.4 cm on prior chest CT 01/02/2016. 12 mm short axis hepatoduodenal ligament lymph node is unchanged and nonspecific. Reproductive: Uterus and ovaries are unremarkable in appearance. Other: Small paraumbilical ventral hernia containing only omental fat incidentally noted. No  significant volume of ascites. No pneumoperitoneum. Musculoskeletal: Sclerotic metastasis in the L2 vertebral body is similar to prior examinations, measuring 1.9 x 3.2 cm on today's study. No new osseous lesions are noted elsewhere. IMPRESSION: 1. Interval enlargement of left adrenal metastasis and malignant left para-aortic nodal metastasis cysts, compatible with progression of disease. 2. There is also a new subpleural nodule with a mean diameter of 5 mm in the medial aspect of the left upper lobe near the apex. This is nonspecific, and may represent a subpleural lymph node, but attention on followup studies is recommended. 3. Previously demonstrated osseous metastases appear unchanged. No other new metastatic lesions identified. 4. Morphologic changes in the liver compatible with underlying cirrhosis. 5. Chronic changes of mild postradiation fibrosis in the left upper lobe deep to the left axilla again noted. 6. Atherosclerosis, including 3 vessel coronary artery disease. Please note that although the presence of coronary artery calcium documents the presence of coronary artery disease, the severity of this disease and any potential stenosis cannot be assessed on this non-gated CT examination. Assessment for potential risk factor modification, dietary therapy or pharmacologic therapy may be warranted, if clinically indicated. 7. Cholelithiasis without evidence of acute cholecystitis at this time. 8. Additional incidental findings, as above. Electronically Signed   By: Vinnie Langton M.D.   On: 02/23/2016 09:49   Ct Abdomen Pelvis W Contrast  02/23/2016  CLINICAL DATA:  70 year old female with history of right-sided breast cancer with metastatic disease to the left adrenal gland and bones. Chemotherapy ongoing. Restaging examination. Chronic left-sided back pain for the past year. EXAM: CT CHEST, ABDOMEN, AND PELVIS WITH CONTRAST TECHNIQUE: Multidetector CT imaging of the chest, abdomen and pelvis was  performed following the standard protocol during bolus administration of intravenous contrast. CONTRAST:  140m ISOVUE-300 IOPAMIDOL (ISOVUE-300) INJECTION 61% COMPARISON:  Chest CT 01/02/2016. MRI of the abdomen 10/02/2015. PET-CT 06/17/2015. Multiple other prior examinations. FINDINGS: CT CHEST FINDINGS Mediastinum/Lymph Nodes: Heart size is normal. There is no significant pericardial fluid, thickening or pericardial calcification. There is atherosclerosis of the thoracic aorta, the great vessels of the mediastinum and the coronary arteries, including calcified atherosclerotic plaque in the left anterior descending, left circumflex and right coronary arteries. No pathologically enlarged mediastinal or hilar lymph nodes. Small hiatal hernia. No axillary lymphadenopathy. Surgical clips in left axilla related to prior left axillary lymph node dissection. Right internal jugular single-lumen porta cath with tip terminating in the distal superior vena cava. Lungs/Pleura: New 6 x 4 mm (mean diameter of 5 mm) subpleural nodule in the medial aspect of the left upper lobe (image 43 of series 4) is nonspecific, but favored to represent a subpleural lymph node. 6 mm nodule in the left lower lobe (image 126 of series 4) is unchanged compared to prior study 12/08/2009, considered benign. Likewise, 4 mm nodules in the right lower lobe (image 82 of series 4) and in the right upper lobe (image 37 of series 4) are unchanged compared to 12/08/2009, also benign. No other new suspicious appearing pulmonary nodules or masses are noted. There is no acute consolidative airspace  disease. No pleural effusions. Septal thickening, ground-glass attenuation, architectural distortion and mild cylindrical bronchiectasis in the left upper lobe deep to the left axilla is similar to prior studies, most compatible with evolving postradiation changes. Musculoskeletal/Soft Tissues: Mixed lucent and sclerotic lesion again noted in T9 vertebral body  measuring up to 1.6 cm in diameter (sagittal image 119 of series 602). Old nondisplaced fractures of the posterior aspect of the left tenth and eleventh ribs are again noted. Bilateral breast implants are noted. Extensive capsular calcification. Skin thickening and stranding throughout the soft tissues of the left breast, presumably from prior radiation therapy. CT ABDOMEN AND PELVIS FINDINGS Hepatobiliary: The liver has a shrunken appearance and nodular contour, compatible with underlying cirrhosis. No cystic or solid hepatic lesions. No intra or extrahepatic biliary ductal dilatation. Multiple tiny calcified gallstones lie dependently in the gallbladder. No findings to suggest an acute cholecystitis at this time. Pancreas: No pancreatic mass. No pancreatic ductal dilatation. No pancreatic or peripancreatic fluid or inflammatory changes. Spleen: Unremarkable. Adrenals/Urinary Tract: Right adrenal gland is normal. Left adrenal nodule (likely a metastasis) has slightly increased in size compared to the prior study, currently measuring 2.2 x 1.4 cm (image 16 of series 5), previously 2.0 x 1.0 cm on prior chest CT 01/02/2016. Severe atrophy of the new left native kidney again noted. Right kidney is normal in appearance. No hydroureteronephrosis. Urinary bladder is normal in appearance. Stomach/Bowel: Normal appearance of the stomach. There is no pathologic dilatation of small bowel or colon. Normal appendix. Vascular/Lymphatic: Atherosclerosis throughout the abdominal and pelvic vasculature, without evidence of aneurysm or dissection. Further enlargement of the previously demonstrated left periaortic nodal mass which currently measures 4.3 x 3.4 cm (axial image 62 of series 2), previously 2.4 x 2.4 cm on prior chest CT 01/02/2016. 12 mm short axis hepatoduodenal ligament lymph node is unchanged and nonspecific. Reproductive: Uterus and ovaries are unremarkable in appearance. Other: Small paraumbilical ventral hernia  containing only omental fat incidentally noted. No significant volume of ascites. No pneumoperitoneum. Musculoskeletal: Sclerotic metastasis in the L2 vertebral body is similar to prior examinations, measuring 1.9 x 3.2 cm on today's study. No new osseous lesions are noted elsewhere. IMPRESSION: 1. Interval enlargement of left adrenal metastasis and malignant left para-aortic nodal metastasis cysts, compatible with progression of disease. 2. There is also a new subpleural nodule with a mean diameter of 5 mm in the medial aspect of the left upper lobe near the apex. This is nonspecific, and may represent a subpleural lymph node, but attention on followup studies is recommended. 3. Previously demonstrated osseous metastases appear unchanged. No other new metastatic lesions identified. 4. Morphologic changes in the liver compatible with underlying cirrhosis. 5. Chronic changes of mild postradiation fibrosis in the left upper lobe deep to the left axilla again noted. 6. Atherosclerosis, including 3 vessel coronary artery disease. Please note that although the presence of coronary artery calcium documents the presence of coronary artery disease, the severity of this disease and any potential stenosis cannot be assessed on this non-gated CT examination. Assessment for potential risk factor modification, dietary therapy or pharmacologic therapy may be warranted, if clinically indicated. 7. Cholelithiasis without evidence of acute cholecystitis at this time. 8. Additional incidental findings, as above. Electronically Signed   By: Vinnie Langton M.D.   On: 02/23/2016 09:49     ASSESSMENT: 70 y.o. Maria Pruitt, [place] woman  (1) status post left breast biopsy in February of 2011 for an invasive lobular carcinoma measuring 2.9 cm by MRI,  with a positive prechemotherapy axillary lymph node biopsy, strongly estrogen and progesterone receptor positive with no evidence of HER-2/neu amplification and an MIB-1 of 14%.    (2) received 6 cycles of docetaxel/ cyclophosphamide in the neoadjuvant setting, completed in mid July of 2011   (3) s/p left lumpectomy and axillary lymph node dissection in August of 2011 for a ypT1b yTN3 (14 positive lymph nodes), Stage IIIC, grade 1 residual tumor. HER-2/neu was repeated, again not amplified.   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 Discontinued 08/29/15 with progression  (8) denosumab/ Xgeva started 07/18/2015, repeated monthly  (9) eribulin D1/D8 q 21 days started 09/23/14. 1 complete cycle given before hospitalization. Discontinued due to elevation in LFTs.   (10) hospitalized for infuenza A on 09/26/15. Pulmonary embolus found. Lovenox started daily.  (11) hepatic encephalopathy during hospital stay in January 2017. Lactulose completed. Awaiting approval of rifaximin due to financial strain.   (12) carboplatin d1/d8 every 21 days started 11/07/2015  (a) day 8 cycle 3 was held due to an increase in the serum creatinine  (b) carboplatin discontinued after 02/20/2016 dose because of progression  (13) palliative radiation pending  (14)  uncontrolled pain: Started on MS Contin 30 mg twice daily beginning 03/05/2016, with MSIR 15 mg every 3 hours as needed  (a) bowel prophylaxis includes stool softeners 2 tablets twice daily, with MiraLAX daily as needed  OTHER PROBLEMS: (a) the patient is status post hemorrhagic CVA January 2016  (b) congenital absence of left kidney  (c) non alcoholic cirrhosis  (d) NSTEMI 01/31/16 treated at [facility]. Cardiac cath performed. Started on aspirin and plavix daily.   PLAN:  I gave Maria Pruitt a copy of her scan and discussed it with her. She understands there has been disease progression on carboplatin. She did try benefit from this drug and it clearly did work initially but it is not working now. Accordingly we are stopping that treatment.  She is uncontrolled pain. I gave her some intravenous Dilaudid in the infusion area and she will start MS Contin at 15 mg 2 tablets in the morning and 2 tablets in the evening with MSIR 15 mg every 3 hours as needed. I gave her those prescriptions today and she will fill them in our pharmacy.  She already has a bowel prophylaxis in place, and if that is insufficient she will add MiraLAX.  I have referred her to radiation oncology and she will see Dr. Kyung Rudd on 03/07/2016 to discuss palliative radiation to the area of nodal disease which is causing her pain area  She will then return to see me on 03/09/2016. At that time we will discuss whether we want to continue treatment, versus place of full hospice referral and change to  a comfort care approach.   Maria Cruel, MD 03/05/2016 12:52 PM   Medical Oncology and Hematology [facility] [address] [place], [place] [address] Tel. [phone]    Fax. [phone]

## 2016-03-05 NOTE — Patient Instructions (Signed)

## 2016-03-06 LAB — CANCER ANTIGEN 27.29: CA 27.29: 66 U/mL — ABNORMAL HIGH (ref 0.0–38.6)

## 2016-03-06 NOTE — Progress Notes (Signed)
Breast Cancer metastatic  To Left Adrenal gland and bones   History Radiation  To the Left  Breast 05/25/2010-07/10/10 Dr. Luciana Axe   Radiation to Pituitary 01/26/85-03/10/85 Dr. Link Snuffer, MD  Dr. Jana Hakim Medical Oncology last note 03/05/16 =Current treatment=denosumab, to consider palliative radiation, Follow up 03/09/16 to discuss whether to continue treatment or hospice referral  HX Hemorrhagic CVA 09/2014, Congenital absence of left kidney,  non alcoholic Cirrhosis  [id number] treated at [facility]=NSTEMI ,cardiac cath  Pain=uncontrolled  BP 85/41 mmHg  Pulse 86  Temp(Src) 98 F (36.7 C) (Oral)  Resp 16  Ht 5\' 2"  (1.575 m)  Wt 150 lb (68.04 kg)  BMI 27.43 kg/m2  SpO2 95% sitting right arm BP 60/31 mmHg  Pulse 84  Temp(Src) 98 F (36.7 C) (Oral)  Resp 16  Ht 5\' 2"  (1.575 m)  Wt 150 lb (68.04 kg)  BMI 27.43 kg/m2  SpO2 95% standing right arm Manual right arm sitting=78/60 10:05 AM

## 2016-03-07 ENCOUNTER — Encounter: Payer: Self-pay | Admitting: Radiation Oncology

## 2016-03-07 ENCOUNTER — Ambulatory Visit
Admission: RE | Admit: 2016-03-07 | Discharge: 2016-03-07 | Disposition: A | Discharge status: Home / Self Care | Payer: Medicare Other | Source: Ambulatory Visit | Attending: Radiation Oncology | Admitting: Radiation Oncology

## 2016-03-07 VITALS — BP 60/31 | HR 84 | Temp 98.0°F | Resp 16 | Ht 62.0 in | Wt 150.0 lb

## 2016-03-07 DIAGNOSIS — R569 Unspecified convulsions: Secondary | ICD-10-CM | POA: Diagnosis not present

## 2016-03-07 DIAGNOSIS — Z8673 Personal history of transient ischemic attack (TIA), and cerebral infarction without residual deficits: Secondary | ICD-10-CM | POA: Insufficient documentation

## 2016-03-07 DIAGNOSIS — E7800 Pure hypercholesterolemia, unspecified: Secondary | ICD-10-CM | POA: Insufficient documentation

## 2016-03-07 DIAGNOSIS — I129 Hypertensive chronic kidney disease with stage 1 through stage 4 chronic kidney disease, or unspecified chronic kidney disease: Secondary | ICD-10-CM | POA: Insufficient documentation

## 2016-03-07 DIAGNOSIS — C50912 Malignant neoplasm of unspecified site of left female breast: Secondary | ICD-10-CM | POA: Diagnosis not present

## 2016-03-07 DIAGNOSIS — Z510 Encounter for antineoplastic radiation therapy: Secondary | ICD-10-CM | POA: Diagnosis not present

## 2016-03-07 DIAGNOSIS — I4891 Unspecified atrial fibrillation: Secondary | ICD-10-CM | POA: Diagnosis not present

## 2016-03-07 DIAGNOSIS — C7972 Secondary malignant neoplasm of left adrenal gland: Secondary | ICD-10-CM | POA: Diagnosis not present

## 2016-03-07 DIAGNOSIS — C50919 Malignant neoplasm of unspecified site of unspecified female breast: Secondary | ICD-10-CM

## 2016-03-07 DIAGNOSIS — C7951 Secondary malignant neoplasm of bone: Secondary | ICD-10-CM | POA: Insufficient documentation

## 2016-03-07 DIAGNOSIS — E278 Other specified disorders of adrenal gland: Secondary | ICD-10-CM

## 2016-03-07 DIAGNOSIS — C7492 Malignant neoplasm of unspecified part of left adrenal gland: Secondary | ICD-10-CM | POA: Insufficient documentation

## 2016-03-07 DIAGNOSIS — Z7982 Long term (current) use of aspirin: Secondary | ICD-10-CM | POA: Diagnosis not present

## 2016-03-07 DIAGNOSIS — Z885 Allergy status to narcotic agent status: Secondary | ICD-10-CM | POA: Insufficient documentation

## 2016-03-07 DIAGNOSIS — I10 Essential (primary) hypertension: Secondary | ICD-10-CM | POA: Insufficient documentation

## 2016-03-07 DIAGNOSIS — C50412 Malignant neoplasm of upper-outer quadrant of left female breast: Secondary | ICD-10-CM

## 2016-03-07 DIAGNOSIS — N183 Chronic kidney disease, stage 3 (moderate): Secondary | ICD-10-CM | POA: Diagnosis not present

## 2016-03-07 DIAGNOSIS — C7970 Secondary malignant neoplasm of unspecified adrenal gland: Secondary | ICD-10-CM | POA: Insufficient documentation

## 2016-03-07 HISTORY — DX: Benign neoplasm of pituitary gland: D35.2

## 2016-03-07 NOTE — Progress Notes (Signed)
Please see the Nurse Progress Note in the MD Initial Consult Encounter for this patient. 

## 2016-03-07 NOTE — Progress Notes (Signed)
Radiation Oncology         [phone] ________________________________  Name: Maria Pruitt MRN: [id number]  Date: 03/07/2016  DOB: 04-22-1946  GO:TLXBWIOM K Thornton Papas, PA-C  Magrinat, Virgie Dad, MD     REFERRING PHYSICIAN: Magrinat, Virgie Dad, MD   DIAGNOSIS: The primary encounter diagnosis was Breast cancer metastasized to bone, unspecified laterality ([place]). A diagnosis of Secondary malignant neoplasm of left adrenal gland [facility]) was also pertinent to this visit. Recurrent Stage III carcinoma of the left breast with metastatic disease to the adrenal gland, retroperitoneal lymph node, andT9.    HISTORY OF PRESENT ILLNESS:Maria Pruitt is a 70 y.o. female who is seen for an initial consultation visit. She has a history of stage IIIleft breast cancer and was treated with neoadjuvant chemotherapy in 2011 and followed this with lumpectomy, and adjuvant radiotherapy with Dr. Tammi Klippel. After radiation in October 2011, she started letrozole November 2011 and continued to September 2016 when she was found to have metastatic disease.   She presented to her primary care in August 2016 with a complaint of abdominal discomfort, nausea, and vomiting. She was treated for a UTI without resolution and obtained a KUB in his office that was nondiagnostic. She had a CT of the abdomen and pelvis 05/06/2015, revealing the left adrenal gland was enlarged 7.5 x 4.0 cm. There was eccentric thickening of the gastric cardia. There were small lymph nodes in the upper abdomen and retroperitoneum. There was no liver involvement. There was severe left kidney atrophy. A biopsy on 05/30/2015, revealing metastatic carcinoma. Pathology showed the tumor was ER and PR negative, Her2-neu negative. She had a PET scan 06/17/2015, showing, in addition to the left adrenal mass, some regional lymph nodes and multiple lesions. She had an EGD that was normal. She went on to complete several courses of systemic therapy which improved her  disease by imaging. Her most recent regimen was carboplatin, though she did have progressive disease noted on her most recent CT scan on 6/  PREVIOUS RADIATION THERAPY: Yes.  Radiation to the left breast 05/25/2010-07/10/2010 by Dr. Tammi Klippel, MD.  Radiation to pituitary 01/26/1985-03/10/1985 by Dr Link Snuffer, MD.   PAST MEDICAL HISTORY:  Past Medical History  Diagnosis Date  . Hypertension   . Breast cancer ([place]) 2011    Stage 3  . Atrial fibrillation ([place])   . Hypercholesterolemia   . Abnormal ECG   . Allergy     seasonal  . Arthritis   . GERD (gastroesophageal reflux disease)   . 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 Seizures ([place])   . Stroke ([place])     dec 15  . Thyroid disease   . Adrenal cancer ([place])   . Fatty liver   . Influenza A       PAST SURGICAL HISTORY: Past Surgical History  Procedure Laterality Date  . Cesarean section  1980  . Breast enhancement surgery  1987  . Umbilical hernia repair    . Tubal ligation    . Scar revision    . Pituitary surgery      adenoma  . Rhinoplasty  1985  . Breast lumpectomy Left   . Colonoscopy       FAMILY HISTORY:  Family History  Problem Relation Age of Onset  . Heart disease Mother   . Heart attack Father   . Heart attack Brother   . Colon cancer Neg Hx      SOCIAL HISTORY:  reports that she has never smoked. She has never used smokeless tobacco. She reports that she does not drink alcohol or use illicit drugs. She is married and resides in [place]. She is accompanied by her sister. 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   ALLERGIES: Ciprofloxacin; Clonidine derivatives; Phenytoin; Sulfa antibiotics; Benzonatate; and Codeine   MEDICATIONS:  Current Outpatient Prescriptions  Medication Sig Dispense Refill  . Ascorbic Acid (VITAMIN C) 100 MG tablet Take 1,000 mg by mouth daily.     Marland Kitchen aspirin (ASPIRIN CHILDRENS) 81 MG chewable tablet Chew 1 tablet (81 mg total) by mouth daily. 100 tablet 4  .  atorvastatin (LIPITOR) 40 MG tablet Take 40 mg by mouth.    . calcium carbonate (TUMS - DOSED IN MG ELEMENTAL CALCIUM) 500 MG chewable tablet Chew 1 tablet (200 mg of elemental calcium total) by mouth 3 (three) times daily. 30 tablet 0  . clopidogrel (PLAVIX) 75 MG tablet Take 75 mg by mouth daily.    . famotidine (PEPCID) 20 MG tablet Take 1 tablet (20 mg total) by mouth 2 (two) times daily. 180 tablet 4  . levothyroxine (SYNTHROID, LEVOTHROID) 100 MCG tablet Take 88 mcg by mouth daily before breakfast.    . losartan (COZAAR) 50 MG tablet Take 50 mg by mouth daily.    . metoprolol (LOPRESSOR) 50 MG tablet Take 50 mg by mouth 2 (two) times daily.    . metoprolol succinate (TOPROL-XL) 50 MG 24 hr tablet Take 50 mg by mouth daily.  0  . morphine (MS CONTIN) 15 MG 12 hr tablet Take 15-30 mg in AM and 30 mg in PM 120 tablet 0  . morphine (MSIR) 15 MG tablet Take 1 tablet (15 mg total) by mouth every 3 (three) hours as needed for moderate pain or severe pain. 60 tablet 0  . ondansetron (ZOFRAN ODT) 8 MG disintegrating tablet Place 8 mg under the tongue as needed.    . ondansetron (ZOFRAN) 8 MG tablet Take 1 tablet (8 mg total) by mouth every 8 (eight) hours as needed for nausea or vomiting. 60 tablet 6  . senna (SENOKOT) 8.6 MG tablet Take 8.6 tablets by mouth 2 (two) times daily. takes 2 bid    . docusate sodium (COLACE) 250 MG capsule Take 1 capsule (250 mg total) by mouth daily. (Patient not taking: Reported on 03/07/2016) 10 capsule 0  . metoCLOPramide (REGLAN) 5 MG tablet Take 1 tablet (5 mg total) by mouth 3 (three) times daily as needed for nausea. (Patient not taking: Reported on 01/23/2016) 30 tablet 1  . polyethylene glycol powder (MIRALAX) powder Take 255 g by mouth once. (Patient not taking: Reported on 03/07/2016) 255 g 0   No current facility-administered medications for this encounter.   Facility-Administered Medications Ordered in Other Encounters  Medication Dose Route Frequency Provider  Last Rate Last Dose  . 0.9 %  sodium chloride infusion   Intravenous Once [name], MD         REVIEW OF SYSTEMS:  On review of systems, the patient reports that today she is doing well overall. She mentions she experiences pain in her left side. She describes it as intense and ranges from a 7 to a 10. She couldn't drink because it made her nauseous. She mentions her pain the last 2 or 3 weeks is constant. Her sister mentions she was taking 5 Oxycodone for immediate relief but did not have long term relief. She was recently given MS Contin to help with the  pain. She denies any chest pain, shortness of breath, cough, fevers, chills, night sweats, unintended weight changes. She denies any bowel or bladder disturbances, and denies  vomiting. She denies any new musculoskeletal or joint aches or pains. A complete review of systems is obtained and is otherwise negative.   PHYSICAL EXAM:  height is _0  (1.575 m) and weight is 150 lb (68.04 kg). Her oral temperature is 98 F (36.7 C). Her blood pressure is 60/31 and her pulse is 84. Her respiration is 16 and oxygen saturation is 95%.    In general this is a well appearing caucasian woman in no acute distress. She is alert and oriented x4 and appropriate throughout the examination. HEENT reveals that the patient is normocephalic, atraumatic. EOMs are intact. PERRLA. Skin is intact without any evidence of gross lesions. Cardiovascular exam reveals a regular rate and rhythm, no clicks rubs or murmurs are auscultated. Chest is clear to auscultation bilaterally. Lymphatic assessment is performed and does not reveal any adenopathy in the cervical, supraclavicular, axillary, or inguinal chains. Abdomen has active bowel sounds in all quadrants and is intact. To the left of the midline at the umbilicus, there is a palpable hernia that reduces and is approximately 3 cm in greatest dimension. Her adrenal tumor cannot be palpated and there is no CVA tenderness.The  abdomen is otherwise soft, non tender, non distended. Lower extremities are negative for pretibial pitting edema, deep calf tenderness, cyanosis or clubbing.   ECOG = 3  0 - Asymptomatic (Fully active, able to carry on all predisease activities without restriction)  1 - Symptomatic but completely ambulatory (Restricted in physically strenuous activity but ambulatory and able to carry out work of a light or sedentary nature. For example, light housework, office work)  2 - Symptomatic, <50% in bed during the day (Ambulatory and capable of all self care but unable to carry out any work activities. Up and about more than 50% of waking hours)  3 - Symptomatic, >50% in bed, but not bedbound (Capable of only limited self-care, confined to bed or chair 50% or more of waking hours)  4 - Bedbound (Completely disabled. Cannot carry on any self-care. Totally confined to bed or chair)  5 - Death   Eustace Pen MM, Creech RH, Tormey DC, et al. [phone]). "Toxicity and response criteria of the [facility]". Kirkpatrick Oncol. 5 (6): 649-55    LABORATORY DATA:  Lab Results  Component Value Date   WBC 7.8 03/05/2016   HGB 12.7 03/05/2016   HCT 36.0 03/05/2016   MCV 100.0 03/05/2016   PLT 285 03/05/2016   Lab Results  Component Value Date   NA 125* 03/05/2016   K 4.1 03/05/2016   CL 103 10/03/2015   CO2 23 03/05/2016   Lab Results  Component Value Date   ALT 16 03/05/2016   AST 18 03/05/2016   ALKPHOS 148 03/05/2016   BILITOT 1.69* 03/05/2016      RADIOGRAPHY: Ct Chest W Contrast  02/23/2016  CLINICAL DATA:  70 year old female with history of right-sided breast cancer with metastatic disease to the left adrenal gland and bones. Chemotherapy ongoing. Restaging examination. Chronic left-sided back pain for the past year. EXAM: CT CHEST, ABDOMEN, AND PELVIS WITH CONTRAST TECHNIQUE: Multidetector CT imaging of the chest, abdomen and pelvis was performed following the standard  protocol during bolus administration of intravenous contrast. CONTRAST:  159m ISOVUE-300 IOPAMIDOL (ISOVUE-300) INJECTION 61% COMPARISON:  Chest CT 01/02/2016. MRI of the abdomen 10/02/2015.  PET-CT 06/17/2015. Multiple other prior examinations. FINDINGS: CT CHEST FINDINGS Mediastinum/Lymph Nodes: Heart size is normal. There is no significant pericardial fluid, thickening or pericardial calcification. There is atherosclerosis of the thoracic aorta, the great vessels of the mediastinum and the coronary arteries, including calcified atherosclerotic plaque in the left anterior descending, left circumflex and right coronary arteries. No pathologically enlarged mediastinal or hilar lymph nodes. Small hiatal hernia. No axillary lymphadenopathy. Surgical clips in left axilla related to prior left axillary lymph node dissection. Right internal jugular single-lumen porta cath with tip terminating in the distal superior vena cava. Lungs/Pleura: New 6 x 4 mm (mean diameter of 5 mm) subpleural nodule in the medial aspect of the left upper lobe (image 43 of series 4) is nonspecific, but favored to represent a subpleural lymph node. 6 mm nodule in the left lower lobe (image 126 of series 4) is unchanged compared to prior study 12/08/2009, considered benign. Likewise, 4 mm nodules in the right lower lobe (image 82 of series 4) and in the right upper lobe (image 37 of series 4) are unchanged compared to 12/08/2009, also benign. No other new suspicious appearing pulmonary nodules or masses are noted. There is no acute consolidative airspace disease. No pleural effusions. Septal thickening, ground-glass attenuation, architectural distortion and mild cylindrical bronchiectasis in the left upper lobe deep to the left axilla is similar to prior studies, most compatible with evolving postradiation changes. Musculoskeletal/Soft Tissues: Mixed lucent and sclerotic lesion again noted in T9 vertebral body measuring up to 1.6 cm in diameter  (sagittal image 119 of series 602). Old nondisplaced fractures of the posterior aspect of the left tenth and eleventh ribs are again noted. Bilateral breast implants are noted. Extensive capsular calcification. Skin thickening and stranding throughout the soft tissues of the left breast, presumably from prior radiation therapy. CT ABDOMEN AND PELVIS FINDINGS Hepatobiliary: The liver has a shrunken appearance and nodular contour, compatible with underlying cirrhosis. No cystic or solid hepatic lesions. No intra or extrahepatic biliary ductal dilatation. Multiple tiny calcified gallstones lie dependently in the gallbladder. No findings to suggest an acute cholecystitis at this time. Pancreas: No pancreatic mass. No pancreatic ductal dilatation. No pancreatic or peripancreatic fluid or inflammatory changes. Spleen: Unremarkable. Adrenals/Urinary Tract: Right adrenal gland is normal. Left adrenal nodule (likely a metastasis) has slightly increased in size compared to the prior study, currently measuring 2.2 x 1.4 cm (image 16 of series 5), previously 2.0 x 1.0 cm on prior chest CT 01/02/2016. Severe atrophy of the new left native kidney again noted. Right kidney is normal in appearance. No hydroureteronephrosis. Urinary bladder is normal in appearance. Stomach/Bowel: Normal appearance of the stomach. There is no pathologic dilatation of small bowel or colon. Normal appendix. Vascular/Lymphatic: Atherosclerosis throughout the abdominal and pelvic vasculature, without evidence of aneurysm or dissection. Further enlargement of the previously demonstrated left periaortic nodal mass which currently measures 4.3 x 3.4 cm (axial image 62 of series 2), previously 2.4 x 2.4 cm on prior chest CT 01/02/2016. 12 mm short axis hepatoduodenal ligament lymph node is unchanged and nonspecific. Reproductive: Uterus and ovaries are unremarkable in appearance. Other: Small paraumbilical ventral hernia containing only omental fat  incidentally noted. No significant volume of ascites. No pneumoperitoneum. Musculoskeletal: Sclerotic metastasis in the L2 vertebral body is similar to prior examinations, measuring 1.9 x 3.2 cm on today's study. No new osseous lesions are noted elsewhere. IMPRESSION: 1. Interval enlargement of left adrenal metastasis and malignant left para-aortic nodal metastasis cysts, compatible with progression of  disease. 2. There is also a new subpleural nodule with a mean diameter of 5 mm in the medial aspect of the left upper lobe near the apex. This is nonspecific, and may represent a subpleural lymph node, but attention on followup studies is recommended. 3. Previously demonstrated osseous metastases appear unchanged. No other new metastatic lesions identified. 4. Morphologic changes in the liver compatible with underlying cirrhosis. 5. Chronic changes of mild postradiation fibrosis in the left upper lobe deep to the left axilla again noted. 6. Atherosclerosis, including 3 vessel coronary artery disease. Please note that although the presence of coronary artery calcium documents the presence of coronary artery disease, the severity of this disease and any potential stenosis cannot be assessed on this non-gated CT examination. Assessment for potential risk factor modification, dietary therapy or pharmacologic therapy may be warranted, if clinically indicated. 7. Cholelithiasis without evidence of acute cholecystitis at this time. 8. Additional incidental findings, as above. Electronically Signed   By: Vinnie Langton M.D.   On: 02/23/2016 09:49   Ct Abdomen Pelvis W Contrast  02/23/2016  CLINICAL DATA:  70 year old female with history of right-sided breast cancer with metastatic disease to the left adrenal gland and bones. Chemotherapy ongoing. Restaging examination. Chronic left-sided back pain for the past year. EXAM: CT CHEST, ABDOMEN, AND PELVIS WITH CONTRAST TECHNIQUE: Multidetector CT imaging of the chest,  abdomen and pelvis was performed following the standard protocol during bolus administration of intravenous contrast. CONTRAST:  128m ISOVUE-300 IOPAMIDOL (ISOVUE-300) INJECTION 61% COMPARISON:  Chest CT 01/02/2016. MRI of the abdomen 10/02/2015. PET-CT 06/17/2015. Multiple other prior examinations. FINDINGS: CT CHEST FINDINGS Mediastinum/Lymph Nodes: Heart size is normal. There is no significant pericardial fluid, thickening or pericardial calcification. There is atherosclerosis of the thoracic aorta, the great vessels of the mediastinum and the coronary arteries, including calcified atherosclerotic plaque in the left anterior descending, left circumflex and right coronary arteries. No pathologically enlarged mediastinal or hilar lymph nodes. Small hiatal hernia. No axillary lymphadenopathy. Surgical clips in left axilla related to prior left axillary lymph node dissection. Right internal jugular single-lumen porta cath with tip terminating in the distal superior vena cava. Lungs/Pleura: New 6 x 4 mm (mean diameter of 5 mm) subpleural nodule in the medial aspect of the left upper lobe (image 43 of series 4) is nonspecific, but favored to represent a subpleural lymph node. 6 mm nodule in the left lower lobe (image 126 of series 4) is unchanged compared to prior study 12/08/2009, considered benign. Likewise, 4 mm nodules in the right lower lobe (image 82 of series 4) and in the right upper lobe (image 37 of series 4) are unchanged compared to 12/08/2009, also benign. No other new suspicious appearing pulmonary nodules or masses are noted. There is no acute consolidative airspace disease. No pleural effusions. Septal thickening, ground-glass attenuation, architectural distortion and mild cylindrical bronchiectasis in the left upper lobe deep to the left axilla is similar to prior studies, most compatible with evolving postradiation changes. Musculoskeletal/Soft Tissues: Mixed lucent and sclerotic lesion again noted  in T9 vertebral body measuring up to 1.6 cm in diameter (sagittal image 119 of series 602). Old nondisplaced fractures of the posterior aspect of the left tenth and eleventh ribs are again noted. Bilateral breast implants are noted. Extensive capsular calcification. Skin thickening and stranding throughout the soft tissues of the left breast, presumably from prior radiation therapy. CT ABDOMEN AND PELVIS FINDINGS Hepatobiliary: The liver has a shrunken appearance and nodular contour, compatible with underlying cirrhosis. No  cystic or solid hepatic lesions. No intra or extrahepatic biliary ductal dilatation. Multiple tiny calcified gallstones lie dependently in the gallbladder. No findings to suggest an acute cholecystitis at this time. Pancreas: No pancreatic mass. No pancreatic ductal dilatation. No pancreatic or peripancreatic fluid or inflammatory changes. Spleen: Unremarkable. Adrenals/Urinary Tract: Right adrenal gland is normal. Left adrenal nodule (likely a metastasis) has slightly increased in size compared to the prior study, currently measuring 2.2 x 1.4 cm (image 16 of series 5), previously 2.0 x 1.0 cm on prior chest CT 01/02/2016. Severe atrophy of the new left native kidney again noted. Right kidney is normal in appearance. No hydroureteronephrosis. Urinary bladder is normal in appearance. Stomach/Bowel: Normal appearance of the stomach. There is no pathologic dilatation of small bowel or colon. Normal appendix. Vascular/Lymphatic: Atherosclerosis throughout the abdominal and pelvic vasculature, without evidence of aneurysm or dissection. Further enlargement of the previously demonstrated left periaortic nodal mass which currently measures 4.3 x 3.4 cm (axial image 62 of series 2), previously 2.4 x 2.4 cm on prior chest CT 01/02/2016. 12 mm short axis hepatoduodenal ligament lymph node is unchanged and nonspecific. Reproductive: Uterus and ovaries are unremarkable in appearance. Other: Small  paraumbilical ventral hernia containing only omental fat incidentally noted. No significant volume of ascites. No pneumoperitoneum. Musculoskeletal: Sclerotic metastasis in the L2 vertebral body is similar to prior examinations, measuring 1.9 x 3.2 cm on today's study. No new osseous lesions are noted elsewhere. IMPRESSION: 1. Interval enlargement of left adrenal metastasis and malignant left para-aortic nodal metastasis cysts, compatible with progression of disease. 2. There is also a new subpleural nodule with a mean diameter of 5 mm in the medial aspect of the left upper lobe near the apex. This is nonspecific, and may represent a subpleural lymph node, but attention on followup studies is recommended. 3. Previously demonstrated osseous metastases appear unchanged. No other new metastatic lesions identified. 4. Morphologic changes in the liver compatible with underlying cirrhosis. 5. Chronic changes of mild postradiation fibrosis in the left upper lobe deep to the left axilla again noted. 6. Atherosclerosis, including 3 vessel coronary artery disease. Please note that although the presence of coronary artery calcium documents the presence of coronary artery disease, the severity of this disease and any potential stenosis cannot be assessed on this non-gated CT examination. Assessment for potential risk factor modification, dietary therapy or pharmacologic therapy may be warranted, if clinically indicated. 7. Cholelithiasis without evidence of acute cholecystitis at this time. 8. Additional incidental findings, as above. Electronically Signed   By: Vinnie Langton M.D.   On: 02/23/2016 09:49       IMPRESSION: Maria Pruitt is a 70 yo woman with breast cancer of the left breast metastatic to the left adrenal gland, and retroperitoneal node and extending into the T9 vertebral body. She is a good candidate for palliative external radiation.    PLAN: Dr. Lisbeth Renshaw discussed the findings from her imaging studies,  and outlined the role of radiation with palliative intent. Dr. Lisbeth Renshaw discusses the rationale for 30 Gy in 10 fractions over 2 weeks time. We discussed the risks and benefits of radiotherapy including short and long term effects of radiotherapy. She has signed written consent and is interested in moving forward. She has a CT simulation appointment scheduled today.   The above documentation reflects my direct findings during this shared patient visit. Please see the separate note by Dr. Lisbeth Renshaw on this date for the remainder of the patient's plan of care.  Carola Rhine, PAC    This document serves as a record of services personally performed by Shona Simpson, PAC and Kyung Rudd, MD. It was created on their behalf by  Lendon Collar, a trained medical scribe. The creation of this record is based on the scribe's personal observations and the provider's statements to them. This document has been checked and approved by the attending provider.

## 2016-03-07 NOTE — Addendum Note (Signed)
Encounter addended by: Doreen Beam, RN on: 03/07/2016  2:02 PM<BR>     Documentation filed: Visit Diagnoses, Charges VN

## 2016-03-09 ENCOUNTER — Ambulatory Visit: Payer: Medicare Other | Admitting: Oncology

## 2016-03-09 DIAGNOSIS — Z510 Encounter for antineoplastic radiation therapy: Secondary | ICD-10-CM | POA: Diagnosis not present

## 2016-03-14 ENCOUNTER — Encounter: Payer: Self-pay | Admitting: Skilled Nursing Facility1

## 2016-03-14 ENCOUNTER — Ambulatory Visit
Admission: RE | Admit: 2016-03-14 | Discharge: 2016-03-14 | Disposition: A | Discharge status: Home / Self Care | Payer: Medicare Other | Source: Ambulatory Visit | Attending: Radiation Oncology | Admitting: Radiation Oncology

## 2016-03-14 DIAGNOSIS — Z510 Encounter for antineoplastic radiation therapy: Secondary | ICD-10-CM | POA: Diagnosis not present

## 2016-03-14 NOTE — Progress Notes (Signed)
Subjective:     Patient ID: Maria Pruitt, female   DOB: [birth year], 70 y.o.   MRN: [id number]  HPI   Review of Systems     Objective:   Physical Exam To assist the pt in identifying dietary strategies to gain lost wt.    Assessment:     Pt identified as being malnourished due to lost wt. Pt contacted via the telephone at [phone]. Pt states she has lost about 50 pounds. Pt states her appetite is poor. Pt states she can only tolerate soft foods like soup, chicken and dumplings. Pt states she cannot drink ensure or boost without vomiting. Pt states she also struggles with constipation. Pt would like information sent to her.    Plan:     Dietitian offered the option of trying to eat something throughout the day. Smoothie recipes were offered as well as other nutrient dense options. Kefir was offered as an option to help with constipation. Dietitian will send information on constipation and calorie/protein dense information.

## 2016-03-15 ENCOUNTER — Ambulatory Visit: Payer: Medicare Other

## 2016-03-15 ENCOUNTER — Other Ambulatory Visit: Payer: Self-pay

## 2016-03-15 ENCOUNTER — Ambulatory Visit (HOSPITAL_BASED_OUTPATIENT_CLINIC_OR_DEPARTMENT_OTHER): Payer: Medicare Other | Admitting: Nurse Practitioner

## 2016-03-15 ENCOUNTER — Ambulatory Visit
Admission: RE | Admit: 2016-03-15 | Discharge: 2016-03-15 | Disposition: A | Discharge status: Home / Self Care | Payer: Medicare Other | Source: Ambulatory Visit | Attending: Radiation Oncology | Admitting: Radiation Oncology

## 2016-03-15 ENCOUNTER — Other Ambulatory Visit (HOSPITAL_BASED_OUTPATIENT_CLINIC_OR_DEPARTMENT_OTHER): Payer: Medicare Other

## 2016-03-15 ENCOUNTER — Telehealth: Payer: Self-pay | Admitting: Oncology

## 2016-03-15 ENCOUNTER — Ambulatory Visit: Payer: Medicare Other | Admitting: Radiation Oncology

## 2016-03-15 ENCOUNTER — Telehealth: Payer: Self-pay | Admitting: Radiation Oncology

## 2016-03-15 ENCOUNTER — Encounter: Payer: Self-pay | Admitting: Radiation Oncology

## 2016-03-15 VITALS — BP 107/75 | HR 148 | Temp 97.9°F | Resp 18

## 2016-03-15 VITALS — BP 65/20 | HR 86 | Resp 17 | Ht 62.0 in | Wt 146.0 lb

## 2016-03-15 VITALS — BP 82/67 | HR 121 | Temp 97.8°F | Resp 20 | Wt 146.2 lb

## 2016-03-15 DIAGNOSIS — Z885 Allergy status to narcotic agent status: Secondary | ICD-10-CM | POA: Diagnosis not present

## 2016-03-15 DIAGNOSIS — R112 Nausea with vomiting, unspecified: Secondary | ICD-10-CM | POA: Insufficient documentation

## 2016-03-15 DIAGNOSIS — R109 Unspecified abdominal pain: Secondary | ICD-10-CM | POA: Diagnosis not present

## 2016-03-15 DIAGNOSIS — C7972 Secondary malignant neoplasm of left adrenal gland: Secondary | ICD-10-CM

## 2016-03-15 DIAGNOSIS — I959 Hypotension, unspecified: Secondary | ICD-10-CM

## 2016-03-15 DIAGNOSIS — I4891 Unspecified atrial fibrillation: Secondary | ICD-10-CM | POA: Diagnosis not present

## 2016-03-15 DIAGNOSIS — I471 Supraventricular tachycardia: Secondary | ICD-10-CM

## 2016-03-15 DIAGNOSIS — C7970 Secondary malignant neoplasm of unspecified adrenal gland: Secondary | ICD-10-CM | POA: Diagnosis not present

## 2016-03-15 DIAGNOSIS — C50912 Malignant neoplasm of unspecified site of left female breast: Secondary | ICD-10-CM

## 2016-03-15 DIAGNOSIS — C50412 Malignant neoplasm of upper-outer quadrant of left female breast: Secondary | ICD-10-CM

## 2016-03-15 DIAGNOSIS — C50111 Malignant neoplasm of central portion of right female breast: Secondary | ICD-10-CM | POA: Diagnosis not present

## 2016-03-15 DIAGNOSIS — C50919 Malignant neoplasm of unspecified site of unspecified female breast: Secondary | ICD-10-CM

## 2016-03-15 DIAGNOSIS — E860 Dehydration: Secondary | ICD-10-CM | POA: Diagnosis not present

## 2016-03-15 DIAGNOSIS — C7951 Secondary malignant neoplasm of bone: Secondary | ICD-10-CM | POA: Diagnosis not present

## 2016-03-15 DIAGNOSIS — Z7982 Long term (current) use of aspirin: Secondary | ICD-10-CM | POA: Insufficient documentation

## 2016-03-15 DIAGNOSIS — Z95828 Presence of other vascular implants and grafts: Secondary | ICD-10-CM

## 2016-03-15 DIAGNOSIS — Z510 Encounter for antineoplastic radiation therapy: Secondary | ICD-10-CM | POA: Diagnosis not present

## 2016-03-15 LAB — COMPREHENSIVE METABOLIC PANEL
ALBUMIN: 2.9 g/dL — AB (ref 3.5–5.0)
ALT: 26 U/L (ref 0–55)
AST: 36 U/L — ABNORMAL HIGH (ref 5–34)
Alkaline Phosphatase: 226 U/L — ABNORMAL HIGH (ref 40–150)
Anion Gap: 13 mEq/L — ABNORMAL HIGH (ref 3–11)
BUN: 9.8 mg/dL (ref 7.0–26.0)
CALCIUM: 8.3 mg/dL — AB (ref 8.4–10.4)
CO2: 22 mEq/L (ref 22–29)
CREATININE: 0.8 mg/dL (ref 0.6–1.1)
Chloride: 90 mEq/L — ABNORMAL LOW (ref 98–109)
EGFR: 72 mL/min/{1.73_m2} — AB (ref >90)
Glucose: 117 mg/dl (ref 70–140)
POTASSIUM: 3.6 meq/L (ref 3.5–5.1)
SODIUM: 126 meq/L — AB (ref 136–145)
Total Bilirubin: 1.48 mg/dL — ABNORMAL HIGH (ref 0.20–1.20)
Total Protein: 6.9 g/dL (ref 6.4–8.3)

## 2016-03-15 LAB — CBC WITH DIFFERENTIAL/PLATELET
BASO%: 0.8 % (ref 0.0–2.0)
BASOS ABS: 0.1 10*3/uL (ref 0.0–0.1)
EOS%: 1.3 % (ref 0.0–7.0)
Eosinophils Absolute: 0.1 10*3/uL (ref 0.0–0.5)
HCT: 36.7 % (ref 34.8–46.6)
HEMOGLOBIN: 12.9 g/dL (ref 11.6–15.9)
LYMPH#: 0.7 10*3/uL — AB (ref 0.9–3.3)
LYMPH%: 6.6 % — AB (ref 14.0–49.7)
MCH: 35.3 pg — AB (ref 25.1–34.0)
MCHC: 35 g/dL (ref 31.5–36.0)
MCV: 101 fL (ref 79.5–101.0)
MONO#: 1.1 10*3/uL — ABNORMAL HIGH (ref 0.1–0.9)
MONO%: 11 % (ref 0.0–14.0)
NEUT#: 7.9 10*3/uL — ABNORMAL HIGH (ref 1.5–6.5)
NEUT%: 80.3 % — AB (ref 38.4–76.8)
Platelets: 266 10*3/uL (ref 145–400)
RBC: 3.64 10*6/uL — ABNORMAL LOW (ref 3.70–5.45)
RDW: 13.6 % (ref 11.2–14.5)
WBC: 9.8 10*3/uL (ref 3.9–10.3)
nRBC: 0 % (ref 0–0)

## 2016-03-15 MED ORDER — ALRA NON-METALLIC DEODORANT (RAD-ONC)
1.0000 | Freq: Once | TOPICAL | Status: DC
Start: 2016-03-15 — End: 2016-03-15

## 2016-03-15 MED ORDER — PROCHLORPERAZINE MALEATE 10 MG PO TABS: 10.0000 mg | ORAL_TABLET | Freq: Four times a day (QID) | ORAL | Status: DC | PRN

## 2016-03-15 MED ORDER — ONDANSETRON 8 MG PO TBDP: 8.0000 mg | ORAL_TABLET | Freq: Three times a day (TID) | ORAL | Status: AC | PRN

## 2016-03-15 MED ORDER — HEPARIN SOD (PORK) LOCK FLUSH 100 UNIT/ML IV SOLN
500.0000 [IU] | Freq: Once | INTRAVENOUS | Status: DC | PRN
  Filled 2016-03-15: qty 5

## 2016-03-15 MED ORDER — LORAZEPAM 0.5 MG PO TABS: 0.5000 mg | ORAL_TABLET | ORAL | Status: DC | PRN

## 2016-03-15 MED ORDER — METOPROLOL TARTRATE 25 MG PO TABS: 25.0000 mg | ORAL_TABLET | Freq: Every day | ORAL | Status: DC

## 2016-03-15 MED ORDER — SODIUM CHLORIDE 0.9 % IV SOLN
INTRAVENOUS | Status: AC
  Administered 2016-03-15: 14:00:00 via INTRAVENOUS

## 2016-03-15 MED ORDER — SODIUM CHLORIDE 0.9 % IJ SOLN
10.0000 mL | INTRAMUSCULAR | Status: DC | PRN
  Filled 2016-03-15: qty 10

## 2016-03-15 MED ORDER — SODIUM CHLORIDE 0.9 % IJ SOLN
10.0000 mL | INTRAMUSCULAR | Status: DC | PRN
  Administered 2016-03-15: 10 mL via INTRAVENOUS
  Filled 2016-03-15: qty 10

## 2016-03-15 MED ORDER — RADIAPLEXRX EX GEL: Freq: Once | CUTANEOUS | Status: DC

## 2016-03-15 MED FILL — PROCHLORPERAZINE 10 MG TAB: 10 | 7 days supply | Qty: 30 | Fill #0

## 2016-03-15 MED FILL — ONDANSETRON ODT 8 MG TABLET: 8 | 10 days supply | Qty: 30 | Fill #0

## 2016-03-15 MED FILL — METOPROLOL TARTRATE 25 MG T: 25 | 30 days supply | Qty: 30 | Fill #0

## 2016-03-15 MED FILL — LORazepam 0.5 MG TABS: 0.5 | 7 days supply | Qty: 30 | Fill #0

## 2016-03-15 NOTE — Telephone Encounter (Signed)
The patient was seen in the supportive clinic

## 2016-03-15 NOTE — Progress Notes (Signed)
Patient became tachycardic on monitor and then briefly bradycardic,  Selena Lesser, NP notified, EKG ordered, resulted with A-fib episodes.  Will consult again with Dr. Jana Hakim.

## 2016-03-15 NOTE — Progress Notes (Signed)
The patient was seen by Dr. Lisbeth Renshaw today for an under treatment assessment. I dropped in to say hello to the patient and she was having vitals taken. BP 82/67 mmHg  Pulse 121  Temp(Src) 97.8 F (36.6 C) (Oral)  Resp 20  Wt 146 lb 3.2 oz (66.316 kg) Her initial BP was 70s/60s and she complained of nausea with emesis. She continues to have left sided abdominal pain controlled by morphine and morphine ER. She is in need of a new Rx for Zofran, requests compazine, and also was given a prescription for ativan as an off label antiemetic. On exam she is tachycardic, has a soft, nondistended abdomen, and no reproducible pain, nor rebound tenderness. Lower extremities are negative for pitting edema.   She will be seen today in symptom management clinic and her case was d/w Selena Lesser, NP.     Carola Rhine, PAC

## 2016-03-15 NOTE — Progress Notes (Signed)
Patient education, radiation therapy and you book, my business card and radiaplex gel given, discussed ways to manage side effects , mild fatigue, skin irritation, n,v, d, weight loss dehydration, drink plenty fluids, may need to eat smaller meals 5-6 during the day with snacks in between, smoothies,   , low fiber diet for diarrhea,imodium prn , discussed with sister , patsint not feeling well at all,  Weekly rad tx to abdomen 2 completed, ,  Has seen dietician 03/14/16, unable to eat much, vomited this am, feels very dizzy light headed, took ortho vitals, sister with patient,  BP 72/50 mmHg  Pulse 134  Temp(Src) 97.8 F (36.6 C) (Oral)  Resp 20  Wt 146 lb 3.2 oz (66.316 kg) right arm sitting BP 82/67 mmHg  Pulse 121  Temp(Src) 97.8 F (36.6 C) (Oral)  Resp 20  Wt 146 lb 3.2 oz (66.316 kg) right arm standing with assist Wt Readings from Last 3 Encounters:  03/15/16 146 lb 3.2 oz (66.316 kg)  03/07/16 150 lb (68.04 kg)  02/20/16 152 lb 3.2 oz (69.037 kg)  Called scheduling  In med onc ,spoke with Sharyn Lull, if we needed to send patient to  Have IVF"s what time could she go? Nothing available till 3pm, thankedMichelle and informed Shona Simpson, our PA,she is calling  Selena Lesser, symptom management NP 12:42 PM

## 2016-03-15 NOTE — Telephone Encounter (Signed)
per pof to sch pt appt-per Ubaldo Glassing ok to sch IVF @ 3-pt here

## 2016-03-15 NOTE — Patient Instructions (Signed)

## 2016-03-15 NOTE — Progress Notes (Signed)
Retrieved patient from Symptom Management and transferred over to infusion.  1 liter of NS was almost completely infused.  VS checked upon transfer to chair.  VS improved.  Selena Lesser NP came to evaluate patient chair side.  She consulted with Dr. Jana Hakim regarding further orders.  Patient states she "feels much improved".

## 2016-03-15 NOTE — Progress Notes (Signed)
Department of Radiation Oncology  Phone:  [phone] Fax:        [phone]  Weekly Treatment Note    Name: Maria Pruitt Date: 03/15/2016 MRN: [id number] DOB: 01/30/1946   Diagnosis:     ICD-9-CM ICD-10-CM   1. Malignant neoplasm of central portion of right female breast (HCC) 174.1 C50.111 hyaluronate sodium (RADIAPLEXRX) gel     prochlorperazine (COMPAZINE) 10 MG tablet     ondansetron (ZOFRAN ODT) 8 MG disintegrating tablet     LORazepam (ATIVAN) 0.5 MG tablet     DISCONTINUED: non-metallic deodorant (ALRA) 1 application  2. Malignant neoplasm metastatic to left adrenal gland (HCC) 198.7 C79.72      Current dose: 6 Gy  Current fraction: 2   MEDICATIONS: Current Outpatient Prescriptions  Medication Sig Dispense Refill  . hyaluronate sodium (RADIAPLEXRX) GEL Apply 1 application topically daily.    . Ascorbic Acid (VITAMIN C) 100 MG tablet Take 1,000 mg by mouth daily.     Marland Kitchen aspirin (ASPIRIN CHILDRENS) 81 MG chewable tablet Chew 1 tablet (81 mg total) by mouth daily. 100 tablet 4  . atorvastatin (LIPITOR) 40 MG tablet Take 40 mg by mouth.    . calcium carbonate (TUMS - DOSED IN MG ELEMENTAL CALCIUM) 500 MG chewable tablet Chew 1 tablet (200 mg of elemental calcium total) by mouth 3 (three) times daily. 30 tablet 0  . clopidogrel (PLAVIX) 75 MG tablet Take 75 mg by mouth daily.    Marland Kitchen docusate sodium (COLACE) 250 MG capsule Take 1 capsule (250 mg total) by mouth daily. (Patient not taking: Reported on 03/07/2016) 10 capsule 0  . famotidine (PEPCID) 20 MG tablet Take 1 tablet (20 mg total) by mouth 2 (two) times daily. 180 tablet 4  . levothyroxine (SYNTHROID, LEVOTHROID) 100 MCG tablet Take 88 mcg by mouth daily before breakfast.    . LORazepam (ATIVAN) 0.5 MG tablet Take 1 tablet (0.5 mg total) by mouth every 4 (four) hours as needed (nausea or vomiting). 30 tablet 1  . losartan (COZAAR) 50 MG tablet Take 50 mg by mouth daily.    . metoCLOPramide (REGLAN) 5 MG  tablet Take 1 tablet (5 mg total) by mouth 3 (three) times daily as needed for nausea. (Patient not taking: Reported on 01/23/2016) 30 tablet 1  . metoprolol (LOPRESSOR) 50 MG tablet Take 50 mg by mouth 2 (two) times daily.    . metoprolol succinate (TOPROL-XL) 50 MG 24 hr tablet Take 50 mg by mouth daily.  0  . morphine (MS CONTIN) 15 MG 12 hr tablet Take 15-30 mg in AM and 30 mg in PM 120 tablet 0  . morphine (MSIR) 15 MG tablet Take 1 tablet (15 mg total) by mouth every 3 (three) hours as needed for moderate pain or severe pain. 60 tablet 0  . ondansetron (ZOFRAN ODT) 8 MG disintegrating tablet Take 1 tablet (8 mg total) by mouth every 8 (eight) hours as needed for nausea. 30 tablet 2  . ondansetron (ZOFRAN) 8 MG tablet Take 1 tablet (8 mg total) by mouth every 8 (eight) hours as needed for nausea or vomiting. 60 tablet 6  . polyethylene glycol powder (MIRALAX) powder Take 255 g by mouth once. (Patient not taking: Reported on 03/07/2016) 255 g 0  . prochlorperazine (COMPAZINE) 10 MG tablet Take 1 tablet (10 mg total) by mouth every 6 (six) hours as needed for nausea or vomiting. 30 tablet 2  . senna (SENOKOT) 8.6 MG tablet Take 8.6 tablets by mouth  2 (two) times daily. takes 2 bid     Current Facility-Administered Medications  Medication Dose Route Frequency Provider Last Rate Last Dose  . hyaluronate sodium (RADIAPLEXRX) gel   Topical Once Hayden Pedro, PA-C       Facility-Administered Medications Ordered in Other Encounters  Medication Dose Route Frequency Provider Last Rate Last Dose  . 0.9 %  sodium chloride infusion   Intravenous Once [name], MD      . sodium chloride 0.9 % injection 10 mL  10 mL Intravenous PRN [name], MD   10 mL at 03/15/16 1321     ALLERGIES: Ciprofloxacin; Clonidine derivatives; Phenytoin; Sulfa antibiotics; Benzonatate; and Codeine   LABORATORY DATA:  Lab Results  Component Value Date   WBC 7.8 03/05/2016   HGB 12.7 03/05/2016    HCT 36.0 03/05/2016   MCV 100.0 03/05/2016   PLT 285 03/05/2016   Lab Results  Component Value Date   NA 125* 03/05/2016   K 4.1 03/05/2016   CL 103 10/03/2015   CO2 23 03/05/2016   Lab Results  Component Value Date   ALT 16 03/05/2016   AST 18 03/05/2016   ALKPHOS 148 03/05/2016   BILITOT 1.69* 03/05/2016     NARRATIVE: Maria Pruitt was seen today for weekly treatment management. The chart was checked and the patient's films were reviewed.  The patient has completed 2 fraction to her abdomen. She saw the dietician on 03/14/16 and is unable to eat much. Reports emesis this morning and feels dizzy and light headed.  PHYSICAL EXAMINATION: weight is 146 lb 3.2 oz (66.316 kg). Her oral temperature is 97.8 F (36.6 C). Her blood pressure is 82/67 and her pulse is 121. Her respiration is 20.   Patient in a wheelchair.  ASSESSMENT: The patient is having significant nausea despite taking Zofran at home. Given refills and additional nausea medicine today. The patient also has abdominal pain and low blood pressure.  PLAN: We will continue with the patient's radiation treatment as planned. She will be seen by symptom management upstairs today for evalation and possible fluids.  ------------------------------------------------  Jodelle Gross, MD, PhD  This document serves as a record of services personally performed by Kyung Rudd, MD. It was created on his behalf by Darcus Austin, a trained medical scribe. The creation of this record is based on the scribe's personal observations and the provider's statements to them. This document has been checked and approved by the attending provider.

## 2016-03-16 ENCOUNTER — Ambulatory Visit
Admission: RE | Admit: 2016-03-16 | Discharge: 2016-03-16 | Disposition: A | Discharge status: Home / Self Care | Payer: Medicare Other | Source: Ambulatory Visit | Attending: Radiation Oncology | Admitting: Radiation Oncology

## 2016-03-16 ENCOUNTER — Other Ambulatory Visit: Payer: Self-pay | Admitting: Nurse Practitioner

## 2016-03-16 ENCOUNTER — Ambulatory Visit (HOSPITAL_BASED_OUTPATIENT_CLINIC_OR_DEPARTMENT_OTHER): Payer: Medicare Other | Admitting: Oncology

## 2016-03-16 ENCOUNTER — Encounter: Payer: Self-pay | Admitting: *Deleted

## 2016-03-16 ENCOUNTER — Other Ambulatory Visit: Payer: Medicare Other

## 2016-03-16 ENCOUNTER — Encounter: Payer: Self-pay | Admitting: Nurse Practitioner

## 2016-03-16 VITALS — BP 108/71 | HR 86 | Temp 97.7°F | Resp 18 | Ht 62.0 in | Wt 146.4 lb

## 2016-03-16 DIAGNOSIS — C773 Secondary and unspecified malignant neoplasm of axilla and upper limb lymph nodes: Secondary | ICD-10-CM

## 2016-03-16 DIAGNOSIS — C7951 Secondary malignant neoplasm of bone: Secondary | ICD-10-CM | POA: Diagnosis not present

## 2016-03-16 DIAGNOSIS — Z510 Encounter for antineoplastic radiation therapy: Secondary | ICD-10-CM | POA: Diagnosis not present

## 2016-03-16 DIAGNOSIS — C50912 Malignant neoplasm of unspecified site of left female breast: Secondary | ICD-10-CM | POA: Diagnosis not present

## 2016-03-16 DIAGNOSIS — C50111 Malignant neoplasm of central portion of right female breast: Secondary | ICD-10-CM

## 2016-03-16 DIAGNOSIS — C7972 Secondary malignant neoplasm of left adrenal gland: Secondary | ICD-10-CM | POA: Diagnosis not present

## 2016-03-16 DIAGNOSIS — E860 Dehydration: Secondary | ICD-10-CM | POA: Insufficient documentation

## 2016-03-16 DIAGNOSIS — R112 Nausea with vomiting, unspecified: Secondary | ICD-10-CM | POA: Insufficient documentation

## 2016-03-16 DIAGNOSIS — I959 Hypotension, unspecified: Secondary | ICD-10-CM | POA: Insufficient documentation

## 2016-03-16 DIAGNOSIS — C50412 Malignant neoplasm of upper-outer quadrant of left female breast: Secondary | ICD-10-CM

## 2016-03-16 MED ORDER — MORPHINE SULFATE ER 15 MG PO TBCR: EXTENDED_RELEASE_TABLET | ORAL | Status: AC

## 2016-03-16 MED ORDER — MORPHINE SULFATE 15 MG PO TABS: 15.0000 mg | ORAL_TABLET | ORAL | Status: AC | PRN

## 2016-03-16 MED ORDER — METOCLOPRAMIDE HCL 5 MG PO TABS: 5.0000 mg | ORAL_TABLET | Freq: Three times a day (TID) | ORAL | Status: DC | PRN

## 2016-03-16 MED ORDER — PROMETHAZINE HCL 25 MG RE SUPP: 25.0000 mg | Freq: Four times a day (QID) | RECTAL | Status: DC | PRN

## 2016-03-16 MED FILL — METOCLOPRAMIDE 5 MG TABLET: 5 | 10 days supply | Qty: 30 | Fill #0

## 2016-03-16 NOTE — Assessment & Plan Note (Signed)
C/o chronic nausea/vomiting; with subsequent dehydration.  Patient will receive IV fluid rehydration while cancer Center today.  She will also be encouraged to push fluids is much as possible.

## 2016-03-16 NOTE — Assessment & Plan Note (Signed)
Blood pressure on initial check your the cancer Center with 65/20.  Patient has had nausea, vomiting and subsequent dehydration.  Patient was given IV fluid rehydration and blood pressure returned to baseline.  Patient stated that she felt much better.  Review of patient's medication list reveal the patient has been taking both losartan and metoprolol in the past.  Patient's family has been holding all of her blood pressure medications for the past 4 days, however.  Patient was advised to continue holding the valsartan; but to take the half dose metoprolol at 25 mg every morning to control her atrial fib history.  Patient was also encouraged to check her blood pressure at home; and to keep a blood pressure log.  She was encouraged to return to the [facility] for additional IV fluid rehydration if needed.

## 2016-03-16 NOTE — Progress Notes (Signed)
[facility] Psychosocial Distress Screening Clinical Social Work  Clinical Social Work was referred by distress screening protocol.  The patient scored a 9 on the Psychosocial Distress Thermometer which indicates severe distress. Clinical Social Worker phoned pt at home to assess for distress and other psychosocial needs. CSW spoke with pt and her sister via phone. Both report to be RNs. Sister is helping care for both pt and pt's husband, but it appears some additional HH support could be helpful. They report plans for family discussion with MD today and will also request additional support at home. Sister and pt discussed issues affording medications, chemo and high insurance costs in general. They have met with financial counselor for assistance and CSW made referral to her and billing for additional assistance. Medical team appears to be addressing physical concerns as appropriate. Pt and sister report to have good support and will reach out as needed. They appreciated call to check in.   ON[facility]N DISTRESS SCREENING 03/07/2016  Screening Type Initial Screening  Distress experienced in past week (1-10) 9  Practical problem type Insurance  Emotional problem type Adjusting to illness  Physical Problem type Pain;Loss of appetitie;Constipation/diarrhea  Physician notified of physical symptoms Yes  Referral to clinical social work Yes    Clinical Social Worker follow up needed: Yes.    If yes, follow up plan: Check on outcome of follow up oncology visit and assist as appropriate.  Loren Racer, [place] Worker [place]  [place] Phone: [phone] Fax: [phone]

## 2016-03-16 NOTE — Assessment & Plan Note (Signed)
Patient has been nauseous and vomiting and feels dehydrated today.  She received approximately 1 L normal saline IV fluid rehydration while at the cancer center.  Also, patient has had chronic hyponatremia with sodium of down to 126 today.  Most likely, this is disease related.  Will continue to monitor closely.

## 2016-03-16 NOTE — Progress Notes (Signed)
. Maria Pruitt   DOB: 1945-09-27  MR#: [id number]  LTR#:[id number]  Maria Nova, PA-C GYN:  SU:  OTHER MD:  CHIEF COMPLAINT: left adrenal metastatsis  CURRENT TREATMENT: palliative radiation  BREAST CANCER HISTORY:  from the original intake note:  Maria Pruitt herself palpated a change in her left breast, shortly before her mammogram was due.  She brought this to [name] attention and he set her up for diagnostic mammography on October 31, 2009.   Routine and implant displaced views of both breasts showed a 2 cm irregular mass in the outer left breast, seen only on the spot tangential view.  The breast tissue itself was fatty.  There were no suspicious calcifications.  This mass was palpable to Dr. [name].  An ultrasound showed it to be 1.7 cm irregular and hypoechoic.  There were at least two enlarged left axillary lymph nodes identified.  Dr. [name] recommended biopsy which was performed the same day and showed ([id number]) and invasive breast cancer felt most likely to be ductal with lobular features.  The left axillary lymph node biopsy also showed the same tumor (similar morphologic findings) and both prognostic profiles showed the cancer to be strongly ER and PR positive with a low to borderline proliferation fraction (13 and 14%).  Neither mass showed amplification of Her-2 by CISH.  (The ratios were 1.1 and 0.85.)    With this information, the patient was referred to Dr. [name] and bilateral breast MRIs were obtained March 1st.  This showed several left axillary lymph nodes with cortical thickening, the largest one measuring 1.1 cm.  The mass itself measured 2.9 cm by MRI.  It was spiculated and irregular.  There was no evidence of contralateral disease.   She was treated neoadjuvantly with docetaxel and cyclophosphamide for 6 cycles completed in July 2011, after which she underwent left lumpectomy and axillary dissection August 2011 for a ypT1b yTN3 (14 positive lymph nodes),  Stage IIIC, grade 1 residual tumor. HER-2/neu was repeated, again not amplified. After completing local regional radiation October 2011 she started letrozole November 2011, continued to September 2016, which she was found to have metastatic disease   METASTATIC DISEASE HISTORY: From the 06/08/2015 summary:   Maria Pruitt returns today for a new problem accompanied by her sister Maria Pruitt. To summarize the recent history: She had a hemorrhagic stroke in January 2016 with some residuals. She has been living next door to her son in the [place] area since then.--In August she presented to her primary care physician, Dr. Thornton Papas, with a complaint of abdominal discomfort, nausea and vomiting. He treated her for a UTI w/o resolution and obtained a KUB in his office; this was nondiagnostic. Accordingly he set up the patient for CT scan of the abdomen and pelvis 05/06/2015. This showed the left adrenal gland to be enlarged to 7.5 x 4.0 cm. There was eccentric wall thickening of the gastric cardia. There were small lymph nodes in the upper abdomen and retroperitoneum. There was no liver involvement. The right adrenal gland was unremarkable. There was severe left kidney atrophy (congenital).  The patient was then set up for biopsy of the left adrenal mass on 05/30/2015. This showed Maria Pruitt [id number]) metastatic carcinoma which was cytokeratin 7 positive, with rare cytokeratin 20 positivity and was negative for gross cystic disease fluid protein. The prognostic panel showed this tumor to be estrogen and progesterone receptor negative, HER-2 negative, with an MIB-1 of 90-100%.   PET scan10/03/2015 which showed in additional to the  left adrenal mass, some regional lymph nodes and multiple bone lesions. There was no obvious primary tumor. She also had an EGD to evaluate a possible gastric primary suggested by the yearly a CT scan of the chest. This was normal. We also obtained tumor markerswhich showed a normal CA 19,a mildly  elevated CEA at 9.7,Maria Pruitt is significantly elevated CA-27-29 at 125. Given this data, the most likely interpretation is that we are dealing with an estrogen receptor negative recurrence of her earlier estrogen receptor positive breast cancer, now stage IV  Her subsequent history is as detailed below  INTERVAL HISTORY: Maria Pruitt returns today for follow-up of her metastatic breast cancer, accompanied by her 2 sons and her daughter in law. Since her last visit here she had studies showing disease progression on her chemotherapy, which has been stopped. She also started palliative radiation to the left adrenal area chiefly for pain control. She had her family are here today to discuss her prognosis and next steps in her treatment   REVIEW OF SYSTEMS: Maria Pruitt has been moderately confused on and off in the past several days. She continues to be more concerned about her husband's health then her own. Her blood pressure has been very low and her family has wisely stopped her blood pressure medications. Her Plavix also was recently stopped, after a month, but she continues appropriately on low-dose aspirin. She denies unusual headaches, visual changes, dizziness, gait imbalance, or falls. She denies cough, phlegm production, or pleurisy. She remains moderately constipated but does take Senokot twice daily. A detailed review of systems today was otherwise stable  PAST MEDICAL HISTORY: Past Medical History  Diagnosis Date  . Hypertension   . Breast cancer ([place]) 2011    Stage 3; recurrence in 2016 with adrenal metastases  . Atrial fibrillation ([place])   . Hypercholesterolemia   . Abnormal ECG   . Allergy     seasonal  . Arthritis   . GERD (gastroesophageal reflux disease)   . 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 Seizures ([place])   . Stroke ([place])     dec 15  . Thyroid disease   . Fatty liver   . Influenza A   . Pituitary adenoma ([place])     s/p surgery and radiation in the 1980s    1. Significant for pituitary adenoma which was removed in 1983 but recurred, requiring radiation and briefly some Parlodel.  She has been off treatment since 1985.  She has some acromegaly secondary to this tumor.   2. She is status post bilateral submuscular breast implants under Dr. Towanda Malkin in 1987.   3. She is status post C-section for her third child who was hydrocephalic.   4. She underwent rhinoplasty in 1984.   5. She has a history of hypertension. 6. History of mitral valve prolapse, but she says she has not been receiving antibiotics prior to dental or similar procedures.   7. History of hypothyroidism.   8. History of congenital absence of one kidney.   9. History of hyperlipidemia.  10. History of obesity.   11. History of renal stones.   12. History of fatty liver.   13. History of gout.   14. History of palpitations.   History of childhood asthma  PAST SURGICAL HISTORY: Past Surgical History  Procedure Laterality Date  . Cesarean section  1980  . Breast enhancement surgery  1987  . Umbilical hernia repair    . Tubal ligation    .  Scar revision    . Pituitary surgery      adenoma  . Rhinoplasty  1985  . Breast lumpectomy Left   . Colonoscopy      FAMILY HISTORY Family History  Problem Relation Age of Onset  . Heart disease Mother   . Heart attack Father   . Heart attack Brother   . Colon cancer Neg Hx    The patient's father died from a myocardial infarction at the age of 45.  The patient's mother died from complications of congestive heart failure at the age of 18.  The patient had one brother who died from a myocardial infarction at the age of 87.  One of the brothers and two sisters are alive and well.  Sr. Maria Pruitt is an ICU nurse. There is no history of breast or ovarian cancer in the family to her knowledge.  GYNECOLOGIC HISTORY: She is GX P3, first pregnancy to term at age 19.  She went through the change of life at the time of her pituitary surgery in  1983.  She took hormone replacement for less than a year because of poor tolerance.    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  She gave up her job in 08/14/10.  Her first husband died from chronic myeloid leukemia in 1985.  Her three children from that marriage include her son Randall Hiss who died from complications of hydrocephaly at age 59; son [name] who is a Insurance underwriter and son [name] who is an Public house manager in this area.  The patient has five grandchildren.  Her second husband of 20+ years, Ron, is a Company secretary of an independent [employer] and also does Writer and is a Oceanographer. He is now disabled due toa traumatic brain injury. He lost his first wife to endometrial cancer.  He has a daughter from that marriage.  She lives in [place]: At the 03/16/2016 visit with the patient's 2 children and daughter-in-law present the family and patient agreed that in case of a terminal event she should not be resuscitated.  HEALTH MAINTENANCE: Social History  Substance Use Topics  . Smoking status: Never Smoker   . Smokeless tobacco: Never Used  . Alcohol Use: No     Colonoscopy:  PAP:  Bone density: January 2012/ osteopenia  Lipid panel:  Allergies  Allergen Reactions  . Ciprofloxacin Other (See Comments)  . Clonidine Derivatives Other (See Comments)  . Phenytoin     Other reaction(s): Other Elevated LFTs  . Sulfa Antibiotics Hives  . Benzonatate     REACTION: Swelling, tessalon perles; tolerates benadryl  . Codeine     REACTION: Nausea    Current Outpatient Prescriptions  Medication Sig Dispense Refill  . Ascorbic Acid (VITAMIN C) 100 MG tablet Take 1,000 mg by mouth daily.     Maria Pruitt Kitchen aspirin (ASPIRIN CHILDRENS) 81 MG chewable tablet Chew 1 tablet (81 mg total) by mouth daily. 100 tablet 4  . atorvastatin (LIPITOR) 40 MG tablet Take 40 mg by mouth.    . calcium carbonate (TUMS - DOSED  IN MG ELEMENTAL CALCIUM) 500 MG chewable tablet Chew 1 tablet (200 mg of elemental calcium total) by mouth 3 (three) times daily. 30 tablet 0  . clopidogrel (PLAVIX) 75 MG tablet Take 75 mg by mouth daily.    Maria Pruitt Kitchen docusate sodium (COLACE) 250 MG capsule Take 1 capsule (250 mg total) by mouth daily. (  Patient not taking: Reported on 03/07/2016) 10 capsule 0  . famotidine (PEPCID) 20 MG tablet Take 1 tablet (20 mg total) by mouth 2 (two) times daily. 180 tablet 4  . hyaluronate sodium (RADIAPLEXRX) GEL Apply 1 application topically daily.    Maria Pruitt Kitchen levothyroxine (SYNTHROID, LEVOTHROID) 100 MCG tablet Take 88 mcg by mouth daily before breakfast.    . LORazepam (ATIVAN) 0.5 MG tablet Take 1 tablet (0.5 mg total) by mouth every 4 (four) hours as needed (nausea or vomiting). 30 tablet 1  . losartan (COZAAR) 50 MG tablet Take 50 mg by mouth daily.    . metoCLOPramide (REGLAN) 5 MG tablet Take 1 tablet (5 mg total) by mouth 3 (three) times daily as needed for nausea. (Patient not taking: Reported on 01/23/2016) 30 tablet 1  . metoprolol succinate (TOPROL-XL) 50 MG 24 hr tablet Take 50 mg by mouth daily.  0  . metoprolol tartrate (LOPRESSOR) 25 MG tablet Take 1 tablet (25 mg total) by mouth daily. 30 tablet 1  . morphine (MS CONTIN) 15 MG 12 hr tablet Take 15-30 mg in AM and 30 mg in PM 120 tablet 0  . morphine (MSIR) 15 MG tablet Take 1 tablet (15 mg total) by mouth every 3 (three) hours as needed for moderate pain or severe pain. 60 tablet 0  . ondansetron (ZOFRAN ODT) 8 MG disintegrating tablet Take 1 tablet (8 mg total) by mouth every 8 (eight) hours as needed for nausea. 30 tablet 2  . ondansetron (ZOFRAN) 8 MG tablet Take 1 tablet (8 mg total) by mouth every 8 (eight) hours as needed for nausea or vomiting. 60 tablet 6  . polyethylene glycol powder (MIRALAX) powder Take 255 g by mouth once. (Patient not taking: Reported on 03/07/2016) 255 g 0  . prochlorperazine (COMPAZINE) 10 MG tablet Take 1 tablet (10 mg total)  by mouth every 6 (six) hours as needed for nausea or vomiting. 30 tablet 2  . senna (SENOKOT) 8.6 MG tablet Take 8.6 tablets by mouth 2 (two) times daily. takes 2 bid     No current facility-administered medications for this visit.   Facility-Administered Medications Ordered in Other Visits  Medication Dose Route Frequency Provider Last Rate Last Dose  . 0.9 %  sodium chloride infusion   Intravenous Once Maria Cruel, MD      . sodium chloride 0.9 % injection 10 mL  10 mL Intravenous PRN Maria Cruel, MD   10 mL at 03/15/16 1321    OBJECTIVE: Middle-aged white woman Who appears stated age 21 Vitals:   03/16/16 1609  BP: 108/71  Pulse: 86  Temp: 97.7 F (36.5 C)  Resp: 18     Body mass index is 26.77 kg/(m^2).    ECOG FS: 2  Sclerae unicteric, EOMs intact Oropharynx clear, slightly dry No cervical or supraclavicular adenopathy Lungs no rales or rhonchi Heart regular rate and rhythm Abd soft, nontender, positive bowel sounds MSK no focal spinal tenderness, no upper extremity lymphedema Neuro: nonfocal, A&O x3, appropriate affect Breasts: Deferred   LAB RESULTS:  CBC Latest Ref Rng 03/15/2016 03/05/2016 02/20/2016  WBC 3.9 - 10.3 10e3/uL 9.8 7.8 6.7  Hemoglobin 11.6 - 15.9 g/dL 12.9 12.7 12.7  Hematocrit 34.8 - 46.6 % 36.7 36.0 35.8  Platelets 145 - 400 10e3/uL 266 285 226     CMP Latest Ref Rng 03/15/2016 03/05/2016 02/20/2016  Glucose 70 - 140 mg/dl 117 133 91  BUN 7.0 - 26.0 mg/dL 9.8 7.9 6.1(L)  Creatinine 0.6 - 1.1 mg/dL 0.8 0.7 0.7  Sodium 136 - 145 mEq/L 126(L) 125(L) 127(L)  Potassium 3.5 - 5.1 mEq/L 3.6 4.1 4.4  CO2 22 - 29 mEq/L 22 23 23   Calcium 8.4 - 10.4 mg/dL 8.3(L) 9.3 8.8  Total Protein 6.4 - 8.3 g/dL 6.9 7.1 7.0  Total Bilirubin 0.20 - 1.20 mg/dL 1.48(H) 1.69(H) 1.77(H)  Alkaline Phos 40 - 150 U/L 226(H) 148 148  AST 5 - 34 U/L 36(H) 18 21  ALT 0 - 55 U/L 26 16 12     STUDIES: Ct Chest W Contrast  02/23/2016  CLINICAL DATA:  70 year old  female with history of right-sided breast cancer with metastatic disease to the left adrenal gland and bones. Chemotherapy ongoing. Restaging examination. Chronic left-sided back pain for the past year. EXAM: CT CHEST, ABDOMEN, AND PELVIS WITH CONTRAST TECHNIQUE: Multidetector CT imaging of the chest, abdomen and pelvis was performed following the standard protocol during bolus administration of intravenous contrast. CONTRAST:  177m ISOVUE-300 IOPAMIDOL (ISOVUE-300) INJECTION 61% COMPARISON:  Chest CT 01/02/2016. MRI of the abdomen 10/02/2015. PET-CT 06/17/2015. Multiple other prior examinations. FINDINGS: CT CHEST FINDINGS Mediastinum/Lymph Nodes: Heart size is normal. There is no significant pericardial fluid, thickening or pericardial calcification. There is atherosclerosis of the thoracic aorta, the great vessels of the mediastinum and the coronary arteries, including calcified atherosclerotic plaque in the left anterior descending, left circumflex and right coronary arteries. No pathologically enlarged mediastinal or hilar lymph nodes. Small hiatal hernia. No axillary lymphadenopathy. Surgical clips in left axilla related to prior left axillary lymph node dissection. Right internal jugular single-lumen porta cath with tip terminating in the distal superior vena cava. Lungs/Pleura: New 6 x 4 mm (mean diameter of 5 mm) subpleural nodule in the medial aspect of the left upper lobe (image 43 of series 4) is nonspecific, but favored to represent a subpleural lymph node. 6 mm nodule in the left lower lobe (image 126 of series 4) is unchanged compared to prior study 12/08/2009, considered benign. Likewise, 4 mm nodules in the right lower lobe (image 82 of series 4) and in the right upper lobe (image 37 of series 4) are unchanged compared to 12/08/2009, also benign. No other new suspicious appearing pulmonary nodules or masses are noted. There is no acute consolidative airspace disease. No pleural effusions. Septal  thickening, ground-glass attenuation, architectural distortion and mild cylindrical bronchiectasis in the left upper lobe deep to the left axilla is similar to prior studies, most compatible with evolving postradiation changes. Musculoskeletal/Soft Tissues: Mixed lucent and sclerotic lesion again noted in T9 vertebral body measuring up to 1.6 cm in diameter (sagittal image 119 of series 602). Old nondisplaced fractures of the posterior aspect of the left tenth and eleventh ribs are again noted. Bilateral breast implants are noted. Extensive capsular calcification. Skin thickening and stranding throughout the soft tissues of the left breast, presumably from prior radiation therapy. CT ABDOMEN AND PELVIS FINDINGS Hepatobiliary: The liver has a shrunken appearance and nodular contour, compatible with underlying cirrhosis. No cystic or solid hepatic lesions. No intra or extrahepatic biliary ductal dilatation. Multiple tiny calcified gallstones lie dependently in the gallbladder. No findings to suggest an acute cholecystitis at this time. Pancreas: No pancreatic mass. No pancreatic ductal dilatation. No pancreatic or peripancreatic fluid or inflammatory changes. Spleen: Unremarkable. Adrenals/Urinary Tract: Right adrenal gland is normal. Left adrenal nodule (likely a metastasis) has slightly increased in size compared to the prior study, currently measuring 2.2 x 1.4 cm (image 16 of series 5),  previously 2.0 x 1.0 cm on prior chest CT 01/02/2016. Severe atrophy of the new left native kidney again noted. Right kidney is normal in appearance. No hydroureteronephrosis. Urinary bladder is normal in appearance. Stomach/Bowel: Normal appearance of the stomach. There is no pathologic dilatation of small bowel or colon. Normal appendix. Vascular/Lymphatic: Atherosclerosis throughout the abdominal and pelvic vasculature, without evidence of aneurysm or dissection. Further enlargement of the previously demonstrated left periaortic  nodal mass which currently measures 4.3 x 3.4 cm (axial image 62 of series 2), previously 2.4 x 2.4 cm on prior chest CT 01/02/2016. 12 mm short axis hepatoduodenal ligament lymph node is unchanged and nonspecific. Reproductive: Uterus and ovaries are unremarkable in appearance. Other: Small paraumbilical ventral hernia containing only omental fat incidentally noted. No significant volume of ascites. No pneumoperitoneum. Musculoskeletal: Sclerotic metastasis in the L2 vertebral body is similar to prior examinations, measuring 1.9 x 3.2 cm on today's study. No new osseous lesions are noted elsewhere. IMPRESSION: 1. Interval enlargement of left adrenal metastasis and malignant left para-aortic nodal metastasis cysts, compatible with progression of disease. 2. There is also a new subpleural nodule with a mean diameter of 5 mm in the medial aspect of the left upper lobe near the apex. This is nonspecific, and may represent a subpleural lymph node, but attention on followup studies is recommended. 3. Previously demonstrated osseous metastases appear unchanged. No other new metastatic lesions identified. 4. Morphologic changes in the liver compatible with underlying cirrhosis. 5. Chronic changes of mild postradiation fibrosis in the left upper lobe deep to the left axilla again noted. 6. Atherosclerosis, including 3 vessel coronary artery disease. Please note that although the presence of coronary artery calcium documents the presence of coronary artery disease, the severity of this disease and any potential stenosis cannot be assessed on this non-gated CT examination. Assessment for potential risk factor modification, dietary therapy or pharmacologic therapy may be warranted, if clinically indicated. 7. Cholelithiasis without evidence of acute cholecystitis at this time. 8. Additional incidental findings, as above. Electronically Signed   By: Vinnie Langton M.D.   On: 02/23/2016 09:49   Ct Abdomen Pelvis W  Contrast  02/23/2016  CLINICAL DATA:  70 year old female with history of right-sided breast cancer with metastatic disease to the left adrenal gland and bones. Chemotherapy ongoing. Restaging examination. Chronic left-sided back pain for the past year. EXAM: CT CHEST, ABDOMEN, AND PELVIS WITH CONTRAST TECHNIQUE: Multidetector CT imaging of the chest, abdomen and pelvis was performed following the standard protocol during bolus administration of intravenous contrast. CONTRAST:  187m ISOVUE-300 IOPAMIDOL (ISOVUE-300) INJECTION 61% COMPARISON:  Chest CT 01/02/2016. MRI of the abdomen 10/02/2015. PET-CT 06/17/2015. Multiple other prior examinations. FINDINGS: CT CHEST FINDINGS Mediastinum/Lymph Nodes: Heart size is normal. There is no significant pericardial fluid, thickening or pericardial calcification. There is atherosclerosis of the thoracic aorta, the great vessels of the mediastinum and the coronary arteries, including calcified atherosclerotic plaque in the left anterior descending, left circumflex and right coronary arteries. No pathologically enlarged mediastinal or hilar lymph nodes. Small hiatal hernia. No axillary lymphadenopathy. Surgical clips in left axilla related to prior left axillary lymph node dissection. Right internal jugular single-lumen porta cath with tip terminating in the distal superior vena cava. Lungs/Pleura: New 6 x 4 mm (mean diameter of 5 mm) subpleural nodule in the medial aspect of the left upper lobe (image 43 of series 4) is nonspecific, but favored to represent a subpleural lymph node. 6 mm nodule in the left lower lobe (image 126  of series 4) is unchanged compared to prior study 12/08/2009, considered benign. Likewise, 4 mm nodules in the right lower lobe (image 82 of series 4) and in the right upper lobe (image 37 of series 4) are unchanged compared to 12/08/2009, also benign. No other new suspicious appearing pulmonary nodules or masses are noted. There is no acute  consolidative airspace disease. No pleural effusions. Septal thickening, ground-glass attenuation, architectural distortion and mild cylindrical bronchiectasis in the left upper lobe deep to the left axilla is similar to prior studies, most compatible with evolving postradiation changes. Musculoskeletal/Soft Tissues: Mixed lucent and sclerotic lesion again noted in T9 vertebral body measuring up to 1.6 cm in diameter (sagittal image 119 of series 602). Old nondisplaced fractures of the posterior aspect of the left tenth and eleventh ribs are again noted. Bilateral breast implants are noted. Extensive capsular calcification. Skin thickening and stranding throughout the soft tissues of the left breast, presumably from prior radiation therapy. CT ABDOMEN AND PELVIS FINDINGS Hepatobiliary: The liver has a shrunken appearance and nodular contour, compatible with underlying cirrhosis. No cystic or solid hepatic lesions. No intra or extrahepatic biliary ductal dilatation. Multiple tiny calcified gallstones lie dependently in the gallbladder. No findings to suggest an acute cholecystitis at this time. Pancreas: No pancreatic mass. No pancreatic ductal dilatation. No pancreatic or peripancreatic fluid or inflammatory changes. Spleen: Unremarkable. Adrenals/Urinary Tract: Right adrenal gland is normal. Left adrenal nodule (likely a metastasis) has slightly increased in size compared to the prior study, currently measuring 2.2 x 1.4 cm (image 16 of series 5), previously 2.0 x 1.0 cm on prior chest CT 01/02/2016. Severe atrophy of the new left native kidney again noted. Right kidney is normal in appearance. No hydroureteronephrosis. Urinary bladder is normal in appearance. Stomach/Bowel: Normal appearance of the stomach. There is no pathologic dilatation of small bowel or colon. Normal appendix. Vascular/Lymphatic: Atherosclerosis throughout the abdominal and pelvic vasculature, without evidence of aneurysm or dissection.  Further enlargement of the previously demonstrated left periaortic nodal mass which currently measures 4.3 x 3.4 cm (axial image 62 of series 2), previously 2.4 x 2.4 cm on prior chest CT 01/02/2016. 12 mm short axis hepatoduodenal ligament lymph node is unchanged and nonspecific. Reproductive: Uterus and ovaries are unremarkable in appearance. Other: Small paraumbilical ventral hernia containing only omental fat incidentally noted. No significant volume of ascites. No pneumoperitoneum. Musculoskeletal: Sclerotic metastasis in the L2 vertebral body is similar to prior examinations, measuring 1.9 x 3.2 cm on today's study. No new osseous lesions are noted elsewhere. IMPRESSION: 1. Interval enlargement of left adrenal metastasis and malignant left para-aortic nodal metastasis cysts, compatible with progression of disease. 2. There is also a new subpleural nodule with a mean diameter of 5 mm in the medial aspect of the left upper lobe near the apex. This is nonspecific, and may represent a subpleural lymph node, but attention on followup studies is recommended. 3. Previously demonstrated osseous metastases appear unchanged. No other new metastatic lesions identified. 4. Morphologic changes in the liver compatible with underlying cirrhosis. 5. Chronic changes of mild postradiation fibrosis in the left upper lobe deep to the left axilla again noted. 6. Atherosclerosis, including 3 vessel coronary artery disease. Please note that although the presence of coronary artery calcium documents the presence of coronary artery disease, the severity of this disease and any potential stenosis cannot be assessed on this non-gated CT examination. Assessment for potential risk factor modification, dietary therapy or pharmacologic therapy may be warranted, if clinically indicated. 7.  Cholelithiasis without evidence of acute cholecystitis at this time. 8. Additional incidental findings, as above. Electronically Signed   By: Vinnie Langton M.D.   On: 02/23/2016 09:49     ASSESSMENT: 70 y.o. Maria Pruitt, [place] woman  (1) status post left breast biopsy in February of 2011 for an invasive lobular carcinoma measuring 2.9 cm by MRI, with a positive prechemotherapy axillary lymph node biopsy, strongly estrogen and progesterone receptor positive with no evidence of HER-2/neu amplification and an MIB-1 of 14%.   (2) received 6 cycles of docetaxel/ cyclophosphamide in the neoadjuvant setting, completed in mid July of 2011   (3) s/p left lumpectomy and axillary lymph node dissection in August of 2011 for a ypT1b yTN3 (14 positive lymph nodes), Stage IIIC, grade 1 residual tumor. HER-2/neu was repeated, again not amplified.   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 Discontinued 08/29/15 with progression  (8) denosumab/ Xgeva started 07/18/2015, repeated monthly  (9) eribulin D1/D8 q 21 days started 09/23/14. 1 complete cycle given before hospitalization. Discontinued due to elevation in LFTs.   (10) hospitalized for infuenza A on 09/26/15. Pulmonary embolus found. Lovenox started daily.  (11)  hepatic encephalopathy during hospital stay in January 2017. Lactulose completed. Awaiting approval of rifaximin due to financial strain.   (12) carboplatin d1/d8 every 21 days started 11/07/2015  (a) day 8 cycle 3 was held due to an increase in the serum creatinine  (b) carboplatin discontinued after 02/20/2016 dose because of progression  (13) palliative radiation to left adrenal area be completed 03/27/2016  (14) uncontrolled pain: Started on MS Contin 30 mg twice daily beginning 03/05/2016, with MSIR 15 mg every 3 hours as needed  (a) bowel prophylaxis includes stool softeners 2 tablets twice daily, with MiraLAX daily as needed  (15) switched to best supportive care and hospice referral placed 03/16/2016  OTHER PROBLEMS: (a) the patient is 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 Cardiac cath performed. Started on aspirin and plavix daily.   PLAN:  I spent approximately 40 minutes today with the patient and her 2 sons and her daughter-in-law. We reviewed the fact that stage IV breast cancer is not curable. It is treatable, and we have treated Maria Pruitt with drugs that are relatively easy to tolerate (at least at the doses and intervals she has received). Despite this there has been clear evidence of disease progression.  We have other drugs that we could use, but they can be expected to be less well tolerated and to cause her more side effects. At the same time the more drugs her cancer learns to bypass the less likely it is that  she will have a significant response to treatment  Given this pattern--more side effects, less likelihood of benefit--I think we are at the point where continuing treatment is more likely to hurt Maria Pruitt than to help her. In fact I think chemotherapy at this point may shorten her life rather than lengthen it.  The patient and her family have  a good understanding of this unfortunate situation. They are agreeable to switching to a best supportive care strategy. They're also agreeable to a hospice referral.  Today we made numerous changes in her medications and specifically I stopped most of her nonessential medications. At the same time I urged the patient to make maximal use of her supportive medicines--she has not been taking metoclopramide for example and this would help her both with constipation and nausea.  We are going to concentrate on hydration--I urged her to drink between 1-1/2 quarts of liquid daily--and nutrition. She is going to consider adding a tablespoon of rum to her boost. Even though we are not going to be treating her cancer itself, my feeling is she is perhaps more likely to die from her other problems including the heart disease and the hepatic failure, which are not related to the cancer diagnosis.  We discussed the fact that in case of a terminal event Maria Pruitt should not be resuscitated or be placed on life support. The patient and her family understand and are in agreement with this.  I'm going to see her just before she completes her palliative radiation treatments to make sure everything is in place. Thereafter I will see her every 6 weeks or as needed. The patient and her family do know to call us for any problems that may develop before that. Maria Cruel, MD 03/16/2016 4:24 PM   Medical Oncology and Hematology [facility] [address] [place], [place] [address] Tel. [phone]    Fax. [phone]

## 2016-03-16 NOTE — Assessment & Plan Note (Addendum)
Patient had received all of her IV fluid rehydration while in the infusion area; and developed a rapid heart rate is high as 148.  Patient continued to deny any chest pain, chest pressure, shortness of breath, or pain with inspiration.  EKG obtained today revealed atrial fibrillation with rapid ventricular response.  Ventricular rate of that time was 140.  Patient states she has a history of atrial fibrillation the past; and has been taking both Losartan and metoprolol for rate control.  Patient also states that she knows how to make herself vagal control any atrial fibrillation in the past. 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   Patient's been experiencing increasingly low blood pressure; and patient's family made the decision to hold all of patient's blood pressure medications for the past 4 days.  Patient states that she has recently experienced a heart attack in May 2017 and has been treated by Dr. Purcell Nails cardiologist.  Patient was closely monitored; and patient's rate returned to patient's baseline; and all other vital signs were stable.  Reviewed all findings with Dr. Jana Hakim; he recommended patient to continue holding the losartan; but to take half dose metoprolol at 25 mg on a daily basis for treatment of the atrial fibrillation.  Also, patient was advised to follow-up with her cardiologist as well.  She was advised to go directly to the emergency department for any worsening symptoms whatsoever.

## 2016-03-16 NOTE — Progress Notes (Signed)
SYMPTOM MANAGEMENT CLINIC    Chief Complaint: Hypotension, dehydration   HPI:  Maria Pruitt 70 y.o. female diagnosed with breast cancer with metastasis to both the bone and adrenal glands.  Patient is status post carboplatin chemotherapy regimen; and continues with radiation treatments.   No history exists.    Review of Systems  Constitutional: Positive for weight loss and malaise/fatigue.  Gastrointestinal: Positive for nausea and vomiting. Negative for abdominal pain.  Neurological: Positive for weakness.  All other systems reviewed and are negative.   Past Medical History  Diagnosis Date  . Hypertension   . Breast cancer ([place]) 2011    Stage 3; recurrence in 2016 with adrenal metastases  . Atrial fibrillation ([place])   . Hypercholesterolemia   . Abnormal ECG   . Allergy     seasonal  . Arthritis   . GERD (gastroesophageal reflux disease)   . 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 Seizures ([place])   . Stroke ([place])     dec 15  . Thyroid disease   . Fatty liver   . Influenza A   . Pituitary adenoma [facility])     s/p surgery and radiation in the 1980s    Past Surgical History  Procedure Laterality Date  . Cesarean section  1980  . Breast enhancement surgery  1987  . Umbilical hernia repair    . Tubal ligation    . Scar revision    . Pituitary surgery      adenoma  . Rhinoplasty  1985  . Breast lumpectomy Left   . Colonoscopy      has HYPERTENSION; Atrial fibrillation with RVR, CHADS 2-3  ([place]); Breast cancer of upper-outer quadrant of left female breast ([place]); Stroke occurring within last month; Adrenal mass, left ([place]); Breast cancer metastasized to bone [facility]); Anemia of chronic disease; Acute kidney injury ([place]); Acute septic pulmonary embolism without acute cor pulmonale ([facility]); PE (pulmonary embolism); Malignant neoplasm of central portion of right female breast ([place]); Cirrhosis, nonalcoholic ([place]); Port catheter in place; Uncontrolled  pain; Metastasis to adrenal gland ([place]); Nausea with vomiting; Dehydration; and Hypotension on her problem list.    is allergic to ciprofloxacin; clonidine derivatives; phenytoin; sulfa antibiotics; benzonatate; and codeine.    Medication List       This list is accurate as of: 03/15/16 11:59 PM.  Always use your most recent med list.               aspirin 81 MG chewable tablet  Commonly known as:  ASPIRIN CHILDRENS  Chew 1 tablet (81 mg total) by mouth daily.     atorvastatin 40 MG tablet  Commonly known as:  LIPITOR  Take 40 mg by mouth.     calcium carbonate 500 MG chewable tablet  Commonly known as:  TUMS - dosed in mg elemental calcium  Chew 1 tablet (200 mg of elemental calcium total) by mouth 3 (three) times daily.     clopidogrel 75 MG tablet  Commonly known as:  PLAVIX  Take 75 mg by mouth daily.     docusate sodium 250 MG capsule  Commonly known as:  COLACE  Take 1 capsule (250 mg total) by mouth daily.     famotidine 20 MG tablet  Commonly known as:  PEPCID  Take 1 tablet (20 mg total) by mouth 2 (two) times daily.     hyaluronate sodium Gel  Apply 1 application topically daily.  levothyroxine 100 MCG tablet  Commonly known as:  SYNTHROID, LEVOTHROID  Take 88 mcg by mouth daily before breakfast.     LORazepam 0.5 MG tablet  Commonly known as:  ATIVAN  Take 1 tablet (0.5 mg total) by mouth every 4 (four) hours as needed (nausea or vomiting).     losartan 50 MG tablet  Commonly known as:  COZAAR  Take 50 mg by mouth daily.     metoCLOPramide 5 MG tablet  Commonly known as:  REGLAN  Take 1 tablet (5 mg total) by mouth 3 (three) times daily as needed for nausea.     metoprolol succinate 50 MG 24 hr tablet  Commonly known as:  TOPROL-XL  Take 50 mg by mouth daily.     metoprolol tartrate 25 MG tablet  Commonly known as:  LOPRESSOR  Take 1 tablet (25 mg total) by mouth daily.     morphine 15 MG 12 hr tablet  Commonly known as:  MS CONTIN  Take  15-30 mg in AM and 30 mg in PM     morphine 15 MG tablet  Commonly known as:  MSIR  Take 1 tablet (15 mg total) by mouth every 3 (three) hours as needed for moderate pain or severe pain.     ondansetron 8 MG disintegrating tablet  Commonly known as:  ZOFRAN ODT  Take 1 tablet (8 mg total) by mouth every 8 (eight) hours as needed for nausea.     ondansetron 8 MG tablet  Commonly known as:  ZOFRAN  Take 1 tablet (8 mg total) by mouth every 8 (eight) hours as needed for nausea or vomiting.     polyethylene glycol powder powder  Commonly known as:  MIRALAX  Take 255 g by mouth once.     prochlorperazine 10 MG tablet  Commonly known as:  COMPAZINE  Take 1 tablet (10 mg total) by mouth every 6 (six) hours as needed for nausea or vomiting.     senna 8.6 MG tablet  Commonly known as:  SENOKOT  Take 8.6 tablets by mouth 2 (two) times daily. takes 2 bid     vitamin C 100 MG tablet  Take 1,000 mg by mouth daily.         PHYSICAL EXAMINATION  Oncology Vitals 03/16/2016 03/15/2016  Height 158 cm -  Weight 66.407 kg -  Weight (lbs) 146 lbs 6 oz -  BMI (kg/m2) 26.78 kg/m2 -  Temp 97.7 -  Pulse 86 148  Resp 18 -  SpO2 100 -  BSA (m2) 1.7 m2 -   BP Readings from Last 2 Encounters:  03/16/16 108/71  03/15/16 107/75    Physical Exam  Constitutional: She is oriented to person, place, and time. She appears dehydrated. She appears unhealthy.  HENT:  Head: Normocephalic and atraumatic.  Eyes: Conjunctivae and EOM are normal. Pupils are equal, round, and reactive to light. Right eye exhibits no discharge. Left eye exhibits no discharge. No scleral icterus.  Neck: Normal range of motion. Neck supple. No JVD present. No tracheal deviation present. No thyromegaly present.  Cardiovascular: Normal heart sounds and intact distal pulses.   Patient had one run of atrial fib; which converted to a normal sinus rhythm.  Pulmonary/Chest: Effort normal and breath sounds normal. No respiratory  distress. She has no wheezes. She has no rales. She exhibits no tenderness.  Abdominal: Soft. Bowel sounds are normal. She exhibits no distension and no mass. There is no tenderness. There is no rebound  and no guarding.  Musculoskeletal: Normal range of motion. She exhibits no edema or tenderness.  Lymphadenopathy:    She has no cervical adenopathy.  Neurological: She is alert and oriented to person, place, and time.  Patient needs assistance with all ambulation.  Skin: Skin is warm and dry. No rash noted. No erythema. There is pallor.  Psychiatric: Affect normal.    LABORATORY DATA:. Appointment on 03/15/2016  Component Date Value Ref Range Status  . WBC 03/15/2016 9.8  3.9 - 10.3 10e3/uL Final  . NEUT# 03/15/2016 7.9* 1.5 - 6.5 10e3/uL Final  . HGB 03/15/2016 12.9  11.6 - 15.9 g/dL Final  . HCT 03/15/2016 36.7  34.8 - 46.6 % Final  . Platelets 03/15/2016 266  145 - 400 10e3/uL Final  . MCV 03/15/2016 101.0  79.5 - 101.0 fL Final  . MCH 03/15/2016 35.3* 25.1 - 34.0 pg Final  . MCHC 03/15/2016 35.0  31.5 - 36.0 g/dL Final  . RBC 03/15/2016 3.64* 3.70 - 5.45 10e6/uL Final  . RDW 03/15/2016 13.6  11.2 - 14.5 % Final  . lymph# 03/15/2016 0.7* 0.9 - 3.3 10e3/uL Final  . MONO# 03/15/2016 1.1* 0.1 - 0.9 10e3/uL Final  . Eosinophils Absolute 03/15/2016 0.1  0.0 - 0.5 10e3/uL Final  . Basophils Absolute 03/15/2016 0.1  0.0 - 0.1 10e3/uL Final  . NEUT% 03/15/2016 80.3* 38.4 - 76.8 % Final  . LYMPH% 03/15/2016 6.6* 14.0 - 49.7 % Final  . MONO% 03/15/2016 11.0  0.0 - 14.0 % Final  . EOS% 03/15/2016 1.3  0.0 - 7.0 % Final  . BASO% 03/15/2016 0.8  0.0 - 2.0 % Final  . nRBC 03/15/2016 0  0 - 0 % Final  . Sodium 03/15/2016 126* 136 - 145 mEq/L Final  . Potassium 03/15/2016 3.6  3.5 - 5.1 mEq/L Final  . Chloride 03/15/2016 90* 98 - 109 mEq/L Final  . CO2 03/15/2016 22  22 - 29 mEq/L Final  . Glucose 03/15/2016 117  70 - 140 mg/dl Final   Glucose reference range is for nonfasting patients.  Fasting glucose reference range is 70- 100.  Marland Kitchen BUN 03/15/2016 9.8  7.0 - 26.0 mg/dL Final  . Creatinine 03/15/2016 0.8  0.6 - 1.1 mg/dL Final  . Total Bilirubin 03/15/2016 1.48* 0.20 - 1.20 mg/dL Final  . Alkaline Phosphatase 03/15/2016 226* 40 - 150 U/L Final  . AST 03/15/2016 36* 5 - 34 U/L Final  . ALT 03/15/2016 26  0 - 55 U/L Final  . Total Protein 03/15/2016 6.9  6.4 - 8.3 g/dL Final  . Albumin 03/15/2016 2.9* 3.5 - 5.0 g/dL Final  . Calcium 03/15/2016 8.3* 8.4 - 10.4 mg/dL Final  . Anion Gap 03/15/2016 13* 3 - 11 mEq/L Final  . EGFR 03/15/2016 72* >90 ml/min/1.73 m2 Final   eGFR is calculated using the CKD-EPI Creatinine Equation (2009)    RADIOGRAPHIC STUDIES: No results found.  ASSESSMENT/PLAN:    Breast cancer metastasized to bone [facility]) Patient is status post carboplatin chemotherapy.  She underwent a restaging scan on 02/23/2016 which revealed progression.  Patient also continues with daily radiation treatments.  She has an appointment to review all findings of her scan with Dr. Jana Hakim tomorrow 03/16/2016.     Atrial fibrillation with RVR, CHADS 2-3  ([place]) Patient had received all of her IV fluid rehydration while in the infusion area; and developed a rapid heart rate is high as 148.  Patient continued to deny any chest pain, chest pressure, shortness of  breath, or pain with inspiration.  EKG obtained today revealed atrial fibrillation with rapid ventricular response.  Ventricular rate of that time was 140.  Patient states she has a history of atrial fibrillation the past; and has been taking both Losartan and metoprolol for rate control.  Patient also states that she knows how to make herself vagal control any atrial fibrillation in the past. 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   Patient's been experiencing increasingly low blood pressure; and patient's family made the decision to hold all of patient's blood pressure medications for the past 4 days.  Patient states that she has  recently experienced a heart attack in May 2017 and has been treated by Dr. Purcell Nails cardiologist.  Patient was closely monitored; and patient's rate returned to patient's baseline; and all other vital signs were stable.  Reviewed all findings with Dr. Jana Hakim; he recommended patient to continue holding the losartan; but to take half dose metoprolol at 25 mg on a daily basis for treatment of the atrial fibrillation.  Also, patient was advised to follow-up with her cardiologist as well.  She was advised to go directly to the emergency department for any worsening symptoms whatsoever.  Nausea with vomiting C/o chronic nausea/vomiting; with subsequent dehydration.  Patient will receive IV fluid rehydration while cancer Center today.  She will also be encouraged to push fluids is much as possible.  Hypotension Blood pressure on initial check your the cancer Center with 65/20.  Patient has had nausea, vomiting and subsequent dehydration.  Patient was given IV fluid rehydration and blood pressure returned to baseline.  Patient stated that she felt much better.  Review of patient's medication list reveal the patient has been taking both losartan and metoprolol in the past.  Patient's family has been holding all of her blood pressure medications for the past 4 days, however.  Patient was advised to continue holding the valsartan; but to take the half dose metoprolol at 25 mg every morning to control her atrial fib history.  Patient was also encouraged to check her blood pressure at home; and to keep a blood pressure log.  She was encouraged to return to the [facility] for additional IV fluid rehydration if needed.  Dehydration Patient has been nauseous and vomiting and feels dehydrated today.  She received approximately 1 L normal saline IV fluid rehydration while at the cancer center.  Also, patient has had chronic hyponatremia with sodium of down to 126 today.  Most likely, this is disease related.   Will continue to monitor closely.    Patient stated understanding of all instructions; and was in agreement with this plan of care. The patient knows to call the clinic with any problems, questions or concerns.   Total time spent with patient was 40 minutes;  with greater than 75 percent of that time spent in face to face counseling regarding patient's symptoms,  and coordination of care and follow up.  Disclaimer:This dictation was prepared with Dragon/digital dictation along with [employer]. Any transcriptional errors that result from this process are unintentional.  Drue Second, NP 03/16/2016

## 2016-03-16 NOTE — Assessment & Plan Note (Addendum)
Patient is status post carboplatin chemotherapy.  She underwent a restaging scan on 02/23/2016 which revealed progression.  Patient also continues with daily radiation treatments.  She has an appointment to review all findings of her scan with Dr. Jana Hakim tomorrow 03/16/2016.

## 2016-03-18 ENCOUNTER — Other Ambulatory Visit: Payer: Self-pay | Admitting: Oncology

## 2016-03-19 ENCOUNTER — Ambulatory Visit
Admission: RE | Admit: 2016-03-19 | Discharge: 2016-03-19 | Disposition: A | Discharge status: Home / Self Care | Payer: Medicare Other | Source: Ambulatory Visit | Attending: Radiation Oncology | Admitting: Radiation Oncology

## 2016-03-19 ENCOUNTER — Ambulatory Visit: Payer: Medicare Other

## 2016-03-19 ENCOUNTER — Other Ambulatory Visit: Payer: Medicare Other

## 2016-03-19 ENCOUNTER — Other Ambulatory Visit: Payer: Self-pay | Admitting: Nurse Practitioner

## 2016-03-19 ENCOUNTER — Telehealth: Payer: Self-pay | Admitting: *Deleted

## 2016-03-19 DIAGNOSIS — Z510 Encounter for antineoplastic radiation therapy: Secondary | ICD-10-CM | POA: Diagnosis not present

## 2016-03-19 NOTE — Telephone Encounter (Signed)
This RN contacted [facility] per MD request to place referral.  Records faxed to above at [phone].

## 2016-03-20 ENCOUNTER — Ambulatory Visit
Admission: RE | Admit: 2016-03-20 | Discharge: 2016-03-20 | Disposition: A | Discharge status: Home / Self Care | Payer: Medicare Other | Source: Ambulatory Visit | Attending: Radiation Oncology | Admitting: Radiation Oncology

## 2016-03-20 ENCOUNTER — Telehealth: Payer: Self-pay | Admitting: *Deleted

## 2016-03-20 DIAGNOSIS — Z510 Encounter for antineoplastic radiation therapy: Secondary | ICD-10-CM | POA: Diagnosis not present

## 2016-03-20 NOTE — Telephone Encounter (Signed)
This RN received call from [employer] with [facility] states pt is " not interested in our services at this time and wants to complete her pallative radiation " . " I left her the information and she will contact us when and if she wants to proceed with services "

## 2016-03-21 ENCOUNTER — Ambulatory Visit
Admission: RE | Admit: 2016-03-21 | Discharge: 2016-03-21 | Disposition: A | Discharge status: Home / Self Care | Payer: Medicare Other | Source: Ambulatory Visit | Attending: Radiation Oncology | Admitting: Radiation Oncology

## 2016-03-21 DIAGNOSIS — Z510 Encounter for antineoplastic radiation therapy: Secondary | ICD-10-CM | POA: Diagnosis not present

## 2016-03-22 ENCOUNTER — Ambulatory Visit
Admission: RE | Admit: 2016-03-22 | Discharge: 2016-03-22 | Disposition: A | Discharge status: Home / Self Care | Payer: Medicare Other | Source: Ambulatory Visit | Attending: Radiation Oncology | Admitting: Radiation Oncology

## 2016-03-22 DIAGNOSIS — Z510 Encounter for antineoplastic radiation therapy: Secondary | ICD-10-CM | POA: Diagnosis not present

## 2016-03-23 ENCOUNTER — Encounter: Payer: Self-pay | Admitting: Radiation Oncology

## 2016-03-23 ENCOUNTER — Ambulatory Visit
Admission: RE | Admit: 2016-03-23 | Discharge: 2016-03-23 | Disposition: A | Discharge status: Home / Self Care | Payer: Medicare Other | Source: Ambulatory Visit | Attending: Radiation Oncology | Admitting: Radiation Oncology

## 2016-03-23 ENCOUNTER — Telehealth: Payer: Self-pay | Admitting: *Deleted

## 2016-03-23 VITALS — BP 92/72 | HR 91 | Temp 98.1°F | Resp 16 | Wt 142.0 lb

## 2016-03-23 DIAGNOSIS — C7972 Secondary malignant neoplasm of left adrenal gland: Secondary | ICD-10-CM

## 2016-03-23 DIAGNOSIS — Z510 Encounter for antineoplastic radiation therapy: Secondary | ICD-10-CM | POA: Diagnosis not present

## 2016-03-23 MED ORDER — MEGESTROL ACETATE 625 MG/5ML PO SUSP: 625.0000 mg | Freq: Every day | ORAL | Status: DC

## 2016-03-23 MED FILL — MEGESTROL ACET 40 MG/ML SUS: 40 | 10 days supply | Qty: 150 | Fill #0

## 2016-03-23 NOTE — Telephone Encounter (Signed)
[facility] outpt pharmacy called stating the megace 625mg es/ml was too expensive, can they change it to 40mg /ml?mspoke with Shona Simpson PA, and yes can be changed to 40mg /ml, to take 25ml per day to equal the 625mg /ml, thanked  This RN and [facility] PA 12:09 PM

## 2016-03-23 NOTE — Progress Notes (Signed)
Weekly radiation treatments left adrenal 8/10  Completed, started reglan bid for nausea, no c/o pain,  Per sister Dr. Jana Hakim was going to rx appetite enhancement, not at pharmacy, poor appetite, fatigu BP 92/72 mmHg  Pulse 91  Temp(Src) 98.1 F (36.7 C) (Oral)  Resp 16  Wt 142 lb (64.411 kg)  Wt Readings from Last 3 Encounters:  03/23/16 142 lb (64.411 kg)  03/16/16 146 lb 6.4 oz (66.407 kg)  03/15/16 146 lb (66.225 kg)  e and weakness

## 2016-03-24 NOTE — Progress Notes (Signed)
Department of Radiation Oncology  Phone:  [phone] Fax:        [phone]  Weekly Treatment Note    Name: Maria Pruitt Date: 03/24/2016 MRN: [id number] DOB: 1946/01/28   Diagnosis:     ICD-9-CM ICD-10-CM   1. Malignant neoplasm metastatic to left adrenal gland (HCC) 198.7 C79.72      Current dose: 24 Gy  Current fraction: 8   MEDICATIONS: Current Outpatient Prescriptions  Medication Sig Dispense Refill  . Ascorbic Acid (VITAMIN C) 100 MG tablet Take 1,000 mg by mouth daily.     Marland Kitchen aspirin (ASPIRIN CHILDRENS) 81 MG chewable tablet Chew 1 tablet (81 mg total) by mouth daily. 100 tablet 4  . famotidine (PEPCID) 20 MG tablet Take 1 tablet (20 mg total) by mouth 2 (two) times daily. 180 tablet 4  . hyaluronate sodium (RADIAPLEXRX) GEL Apply 1 application topically daily.    Marland Kitchen levothyroxine (SYNTHROID, LEVOTHROID) 100 MCG tablet Take 88 mcg by mouth daily before breakfast.    . metoCLOPramide (REGLAN) 5 MG tablet Take 1 tablet (5 mg total) by mouth 3 (three) times daily as needed for nausea. 30 tablet 1  . metoprolol tartrate (LOPRESSOR) 25 MG tablet Take 1 tablet (25 mg total) by mouth daily. 30 tablet 1  . morphine (MS CONTIN) 15 MG 12 hr tablet Take 15-30 mg in AM and 30 mg in PM 120 tablet 0  . morphine (MSIR) 15 MG tablet Take 1 tablet (15 mg total) by mouth every 3 (three) hours as needed for moderate pain or severe pain. 60 tablet 0  . prochlorperazine (COMPAZINE) 10 MG tablet Take 1 tablet (10 mg total) by mouth every 6 (six) hours as needed for nausea or vomiting. 30 tablet 2  . promethazine (PHENERGAN) 25 MG suppository Place 1 suppository (25 mg total) rectally every 6 (six) hours as needed for nausea or vomiting. 12 each 0  . senna (SENOKOT) 8.6 MG tablet Take 8.6 tablets by mouth 2 (two) times daily. takes 2 bid    . megestrol (MEGACE ES) 625 MG/5ML suspension Take 5 mLs (625 mg total) by mouth daily. 150 mL 1  . ondansetron (ZOFRAN ODT) 8 MG  disintegrating tablet Take 1 tablet (8 mg total) by mouth every 8 (eight) hours as needed for nausea. (Patient not taking: Reported on 03/23/2016) 30 tablet 2  . polyethylene glycol powder (MIRALAX) powder Take 255 g by mouth once. (Patient not taking: Reported on 03/07/2016) 255 g 0   No current facility-administered medications for this encounter.     ALLERGIES: Ciprofloxacin; Clonidine derivatives; Phenytoin; Sulfa antibiotics; Benzonatate; and Codeine   LABORATORY DATA:  Lab Results  Component Value Date   WBC 9.8 03/15/2016   HGB 12.9 03/15/2016   HCT 36.7 03/15/2016   MCV 101.0 03/15/2016   PLT 266 03/15/2016   Lab Results  Component Value Date   NA 126* 03/15/2016   K 3.6 03/15/2016   CL 103 10/03/2015   CO2 22 03/15/2016   Lab Results  Component Value Date   ALT 26 03/15/2016   AST 36* 03/15/2016   ALKPHOS 226* 03/15/2016   BILITOT 1.48* 03/15/2016     NARRATIVE: Maria Pruitt was seen today for weekly treatment management. The chart was checked and the patient's films were reviewed.  Weekly radiation treatments left adrenal 8/10  Completed, started reglan bid for nausea, no c/o pain,  Per sister Dr. Jana Hakim was going to rx appetite enhancement, not at pharmacy, poor appetite, fatigu BP  92/72 mmHg  Pulse 91  Temp(Src) 98.1 F (36.7 C) (Oral)  Resp 16  Wt 142 lb (64.411 kg)  Wt Readings from Last 3 Encounters:  03/23/16 142 lb (64.411 kg)  03/16/16 146 lb 6.4 oz (66.407 kg)  03/15/16 146 lb (66.225 kg)  e and weakness   PHYSICAL EXAMINATION: weight is 142 lb (64.411 kg). Her oral temperature is 98.1 F (36.7 C). Her blood pressure is 92/72 and her pulse is 91. Her respiration is 16.        ASSESSMENT: The patient is doing satisfactorily with treatment.  PLAN: We will continue with the patient's radiation treatment as planned. The patient has been given a prescription for Megace.

## 2016-03-26 ENCOUNTER — Other Ambulatory Visit: Payer: Self-pay | Admitting: Nurse Practitioner

## 2016-03-26 ENCOUNTER — Ambulatory Visit
Admission: RE | Admit: 2016-03-26 | Discharge: 2016-03-26 | Disposition: A | Discharge status: Home / Self Care | Payer: Medicare Other | Source: Ambulatory Visit | Attending: Radiation Oncology | Admitting: Radiation Oncology

## 2016-03-26 ENCOUNTER — Telehealth: Payer: Self-pay | Admitting: Nurse Practitioner

## 2016-03-26 ENCOUNTER — Encounter: Payer: Self-pay | Admitting: Radiation Oncology

## 2016-03-26 ENCOUNTER — Ambulatory Visit (HOSPITAL_BASED_OUTPATIENT_CLINIC_OR_DEPARTMENT_OTHER): Payer: Medicare Other | Admitting: Nurse Practitioner

## 2016-03-26 ENCOUNTER — Other Ambulatory Visit (HOSPITAL_BASED_OUTPATIENT_CLINIC_OR_DEPARTMENT_OTHER): Payer: Medicare Other

## 2016-03-26 ENCOUNTER — Ambulatory Visit (HOSPITAL_BASED_OUTPATIENT_CLINIC_OR_DEPARTMENT_OTHER): Payer: Medicare Other

## 2016-03-26 VITALS — BP 86/55 | HR 82 | Temp 97.8°F | Resp 16

## 2016-03-26 VITALS — BP 96/51 | HR 80 | Temp 98.8°F | Resp 18

## 2016-03-26 VITALS — BP 91/53 | HR 81

## 2016-03-26 DIAGNOSIS — Z452 Encounter for adjustment and management of vascular access device: Secondary | ICD-10-CM

## 2016-03-26 DIAGNOSIS — E46 Unspecified protein-calorie malnutrition: Secondary | ICD-10-CM

## 2016-03-26 DIAGNOSIS — E860 Dehydration: Secondary | ICD-10-CM

## 2016-03-26 DIAGNOSIS — C50412 Malignant neoplasm of upper-outer quadrant of left female breast: Secondary | ICD-10-CM

## 2016-03-26 DIAGNOSIS — C7951 Secondary malignant neoplasm of bone: Secondary | ICD-10-CM

## 2016-03-26 DIAGNOSIS — R531 Weakness: Secondary | ICD-10-CM

## 2016-03-26 DIAGNOSIS — C7972 Secondary malignant neoplasm of left adrenal gland: Secondary | ICD-10-CM

## 2016-03-26 DIAGNOSIS — R770 Abnormality of albumin: Secondary | ICD-10-CM

## 2016-03-26 DIAGNOSIS — Z95828 Presence of other vascular implants and grafts: Secondary | ICD-10-CM

## 2016-03-26 DIAGNOSIS — Z510 Encounter for antineoplastic radiation therapy: Secondary | ICD-10-CM | POA: Diagnosis not present

## 2016-03-26 DIAGNOSIS — I959 Hypotension, unspecified: Secondary | ICD-10-CM

## 2016-03-26 DIAGNOSIS — R112 Nausea with vomiting, unspecified: Secondary | ICD-10-CM | POA: Diagnosis not present

## 2016-03-26 DIAGNOSIS — E8809 Other disorders of plasma-protein metabolism, not elsewhere classified: Secondary | ICD-10-CM

## 2016-03-26 DIAGNOSIS — C7970 Secondary malignant neoplasm of unspecified adrenal gland: Secondary | ICD-10-CM | POA: Diagnosis not present

## 2016-03-26 LAB — COMPREHENSIVE METABOLIC PANEL
ALBUMIN: 2.6 g/dL — AB (ref 3.5–5.0)
ALK PHOS: 211 U/L — AB (ref 40–150)
ALT: 13 U/L (ref 0–55)
ANION GAP: 12 meq/L — AB (ref 3–11)
AST: 20 U/L (ref 5–34)
BILIRUBIN TOTAL: 1.8 mg/dL — AB (ref 0.20–1.20)
BUN: 10 mg/dL (ref 7.0–26.0)
CO2: 20 mEq/L — ABNORMAL LOW (ref 22–29)
Calcium: 7.8 mg/dL — ABNORMAL LOW (ref 8.4–10.4)
Chloride: 95 mEq/L — ABNORMAL LOW (ref 98–109)
Creatinine: 0.6 mg/dL (ref 0.6–1.1)
Glucose: 114 mg/dl (ref 70–140)
POTASSIUM: 3.6 meq/L (ref 3.5–5.1)
Sodium: 127 mEq/L — ABNORMAL LOW (ref 136–145)
TOTAL PROTEIN: 6.9 g/dL (ref 6.4–8.3)

## 2016-03-26 LAB — CBC WITH DIFFERENTIAL/PLATELET
BASO%: 0.4 % (ref 0.0–2.0)
Basophils Absolute: 0 10*3/uL (ref 0.0–0.1)
EOS%: 0.6 % (ref 0.0–7.0)
Eosinophils Absolute: 0 10*3/uL (ref 0.0–0.5)
HEMATOCRIT: 35.2 % (ref 34.8–46.6)
HGB: 12.2 g/dL (ref 11.6–15.9)
LYMPH#: 0.3 10*3/uL — AB (ref 0.9–3.3)
LYMPH%: 4.3 % — ABNORMAL LOW (ref 14.0–49.7)
MCH: 34.9 pg — ABNORMAL HIGH (ref 25.1–34.0)
MCHC: 34.6 g/dL (ref 31.5–36.0)
MCV: 101 fL (ref 79.5–101.0)
MONO#: 0.8 10*3/uL (ref 0.1–0.9)
MONO%: 10.6 % (ref 0.0–14.0)
NEUT#: 6.6 10*3/uL — ABNORMAL HIGH (ref 1.5–6.5)
NEUT%: 84.1 % — AB (ref 38.4–76.8)
Platelets: 261 10*3/uL (ref 145–400)
RBC: 3.49 10*6/uL — ABNORMAL LOW (ref 3.70–5.45)
RDW: 13.9 % (ref 11.2–14.5)
WBC: 7.9 10*3/uL (ref 3.9–10.3)

## 2016-03-26 MED ORDER — HEPARIN SOD (PORK) LOCK FLUSH 100 UNIT/ML IV SOLN
500.0000 [IU] | Freq: Once | INTRAVENOUS | Status: AC | PRN
  Administered 2016-03-26: 500 [IU] via INTRAVENOUS
  Filled 2016-03-26: qty 5

## 2016-03-26 MED ORDER — SODIUM CHLORIDE 0.9 % IV SOLN
Freq: Once | INTRAVENOUS | Status: AC
  Administered 2016-03-26: 14:00:00 via INTRAVENOUS
  Filled 2016-03-26: qty 4

## 2016-03-26 MED ORDER — SODIUM CHLORIDE 0.9 % IJ SOLN
10.0000 mL | INTRAMUSCULAR | Status: DC | PRN
  Filled 2016-03-26: qty 10

## 2016-03-26 MED ORDER — SODIUM CHLORIDE 0.9 % IJ SOLN
10.0000 mL | INTRAMUSCULAR | Status: DC | PRN
  Administered 2016-03-26 (×2): 10 mL via INTRAVENOUS
  Filled 2016-03-26: qty 10

## 2016-03-26 MED ORDER — SODIUM CHLORIDE 0.9 % IV SOLN
Freq: Once | INTRAVENOUS | Status: AC
  Administered 2016-03-26: 14:00:00 via INTRAVENOUS

## 2016-03-26 NOTE — Telephone Encounter (Signed)
Added apt per pof, apt next available

## 2016-03-26 NOTE — Progress Notes (Signed)
Labs drawn on Ms. Uhl then escorted to Retta Mac, NP accompanied by her sister.

## 2016-03-26 NOTE — Progress Notes (Addendum)
patient in room 7 at nursing  lyng down,c/o extreme weakness, no nausea, hasn't eaten anything today as yet, started megace  Saturday to increase appetite, took ortho vitals Lying vitals:BP 109/50 mmHg  Pulse 60  Temp(Src) 97.8 F (36.6 C) (Oral)  Resp 16  SpO2 100%  Siting vitals:BP 131/102 mmHg  Pulse 53  Temp(Src) 97.8 F (36.6 C) (Oral)  Resp 16  SpO2 100%  Standing vitals:BP 86/55 mmHg  Pulse 82  Temp(Src) 97.8 F (36.6 C) (Oral)  Resp 16  SpO2 100%  Called Al[facility] to se patient 11:20 AM

## 2016-03-26 NOTE — Progress Notes (Addendum)
Department of Radiation Oncology  Phone:  [phone] Fax:        [phone]  Work In Visit    Name: Maria Pruitt Date: 03/26/2016 MRN: [id number] DOB: 11/24/1945   Diagnosis:  No diagnosis found.   Current dose: 27 Gy  Current fraction:9/10   MEDICATIONS: Current Outpatient Prescriptions  Medication Sig Dispense Refill  . Ascorbic Acid (VITAMIN C) 100 MG tablet Take 1,000 mg by mouth daily. Reported on 03/26/2016    . aspirin (ASPIRIN CHILDRENS) 81 MG chewable tablet Chew 1 tablet (81 mg total) by mouth daily. 100 tablet 4  . famotidine (PEPCID) 20 MG tablet Take 1 tablet (20 mg total) by mouth 2 (two) times daily. 180 tablet 4  . hyaluronate sodium (RADIAPLEXRX) GEL Apply 1 application topically daily. Reported on 03/26/2016    . levothyroxine (SYNTHROID, LEVOTHROID) 100 MCG tablet Take 88 mcg by mouth daily before breakfast.    . megestrol (MEGACE ES) 625 MG/5ML suspension Take 5 mLs (625 mg total) by mouth daily. 150 mL 1  . metoCLOPramide (REGLAN) 5 MG tablet Take 1 tablet (5 mg total) by mouth 3 (three) times daily as needed for nausea. 30 tablet 1  . metoprolol tartrate (LOPRESSOR) 25 MG tablet Take 1 tablet (25 mg total) by mouth daily. 30 tablet 1  . morphine (MS CONTIN) 15 MG 12 hr tablet Take 15-30 mg in AM and 30 mg in PM 120 tablet 0  . morphine (MSIR) 15 MG tablet Take 1 tablet (15 mg total) by mouth every 3 (three) hours as needed for moderate pain or severe pain. 60 tablet 0  . ondansetron (ZOFRAN ODT) 8 MG disintegrating tablet Take 1 tablet (8 mg total) by mouth every 8 (eight) hours as needed for nausea. 30 tablet 2  . polyethylene glycol powder (MIRALAX) powder Take 255 g by mouth once. 255 g 0  . prochlorperazine (COMPAZINE) 10 MG tablet Take 1 tablet (10 mg total) by mouth every 6 (six) hours as needed for nausea or vomiting. 30 tablet 2  . promethazine (PHENERGAN) 25 MG suppository Place 1 suppository (25 mg total) rectally every 6 (six) hours as  needed for nausea or vomiting. 12 each 0  . senna (SENOKOT) 8.6 MG tablet Take 8.6 tablets by mouth 2 (two) times daily. takes 2 bid     No current facility-administered medications for this encounter.   Facility-Administered Medications Ordered in Other Encounters  Medication Dose Route Frequency Provider Last Rate Last Dose  . sodium chloride 0.9 % injection 10 mL  10 mL Intravenous PRN [name], MD   10 mL at 03/26/16 1336     ALLERGIES: Ciprofloxacin; Clonidine derivatives; Phenytoin; Sulfa antibiotics; Benzonatate; and Codeine   LABORATORY DATA:  Lab Results  Component Value Date   WBC 7.9 03/26/2016   HGB 12.2 03/26/2016   HCT 35.2 03/26/2016   MCV 101.0 03/26/2016   PLT 261 03/26/2016   Lab Results  Component Value Date   NA 127* 03/26/2016   K 3.6 03/26/2016   CL 103 10/03/2015   CO2 20* 03/26/2016   Lab Results  Component Value Date   ALT 13 03/26/2016   AST 20 03/26/2016   ALKPHOS 211* 03/26/2016   BILITOT 1.80* 03/26/2016     NARRATIVE: Maria Pruitt was Seen today following her daily palliative radiotherapy. The patient has one additional fractions to complete prior to completing her entire treatment. She is being treated the left adrenal gland due to metastatic breast cancer.  The patient has been significantly declining in the last 2 weeks, and has received IV hydration, and has been noted to have hyponatremia. She is still in the workup process with her endocrinologist for this. She has had poor intake, and describes very little appetite. She is not experiencing any nausea or vomiting despite this. She is not experiencing any abdominal pain, fevers or chills. She denies shortness of breath or chest pain.  PHYSICAL EXAMINATION: Vitals can be referenced in the chart and were reviewed but not yet entered by nursing staff In general this is a chronically ill appearing caucasian female in no acute distress. She is alert and oriented x4 and  appropriate throughout the examination. HEENT reveals that the patient is normocephalic, atraumatic. EOMs are intact. PERRLA. Skin is intact without any evidence of gross lesions but is tented. Cardiovascular exam reveals a irregularly irregular rate and rhythm , no clicks rubs or murmurs are auscultated. Chest is clear to auscultation bilaterally. Lymphatic assessment is performed and does not reveal any adenopathy in the cervical, supraclavicular, axillary, or inguinal chains. Abdomen has active bowel sounds in all quadrants and is intact. The abdomen is soft, non tender, non distended. Lower extremities are negative for pretibial pitting edema, deep calf tenderness, cyanosis or clubbing.   ASSESSMENT/PLAN: 1. Atrial fibrillation. The patient appears to be stable despite the fact that she appears to be in A. fib. Without evidence of RVR, I would defer any additional changes in medication or management to symptom management clinic. Again the long-term prognosis for this patient is quite poor, and she has been recommended to proceed with hospice following radiotherapy. I have tried to coordinate her care to be expedited for her to receive twice a day treatment today however she is not interested in this. We will complete her treatment tomorrow so that she may enroll in hospice. 2. Probable dehydration. I'll discuss her case with Selena Lesser, she will proceed for labs in symptom management clinic and will likely benefit from IV hydration. She is been hyponatremic recently in the past, and we do feel that she would benefit from hydration. 3. Radiotherapy to the adrenal gland for adrenal metastatic disease from breast cancer. Per #1 we will complete her radiation with fraction 10 tomorrow and move towards plans for transition to hospice.      Carola Rhine, PAC

## 2016-03-26 NOTE — Patient Instructions (Signed)

## 2016-03-26 NOTE — Patient Instructions (Signed)

## 2016-03-27 ENCOUNTER — Encounter: Payer: Self-pay | Admitting: Nurse Practitioner

## 2016-03-27 ENCOUNTER — Ambulatory Visit: Payer: Medicare Other

## 2016-03-27 ENCOUNTER — Other Ambulatory Visit: Payer: Self-pay | Admitting: Oncology

## 2016-03-27 ENCOUNTER — Ambulatory Visit
Admission: RE | Admit: 2016-03-27 | Discharge: 2016-03-27 | Disposition: A | Discharge status: Home / Self Care | Payer: Medicare Other | Source: Ambulatory Visit | Attending: Radiation Oncology | Admitting: Radiation Oncology

## 2016-03-27 ENCOUNTER — Other Ambulatory Visit: Payer: Self-pay

## 2016-03-27 ENCOUNTER — Ambulatory Visit (HOSPITAL_BASED_OUTPATIENT_CLINIC_OR_DEPARTMENT_OTHER): Payer: Medicare Other

## 2016-03-27 ENCOUNTER — Ambulatory Visit (HOSPITAL_BASED_OUTPATIENT_CLINIC_OR_DEPARTMENT_OTHER): Payer: Self-pay | Admitting: Nurse Practitioner

## 2016-03-27 ENCOUNTER — Encounter: Payer: Self-pay | Admitting: Radiation Oncology

## 2016-03-27 VITALS — BP 102/53 | HR 70 | Temp 98.0°F | Resp 16

## 2016-03-27 DIAGNOSIS — C50412 Malignant neoplasm of upper-outer quadrant of left female breast: Secondary | ICD-10-CM | POA: Diagnosis not present

## 2016-03-27 DIAGNOSIS — Z452 Encounter for adjustment and management of vascular access device: Secondary | ICD-10-CM

## 2016-03-27 DIAGNOSIS — I959 Hypotension, unspecified: Secondary | ICD-10-CM | POA: Diagnosis not present

## 2016-03-27 DIAGNOSIS — C50912 Malignant neoplasm of unspecified site of left female breast: Secondary | ICD-10-CM | POA: Diagnosis not present

## 2016-03-27 DIAGNOSIS — E860 Dehydration: Secondary | ICD-10-CM

## 2016-03-27 DIAGNOSIS — E46 Unspecified protein-calorie malnutrition: Secondary | ICD-10-CM | POA: Insufficient documentation

## 2016-03-27 DIAGNOSIS — E8809 Other disorders of plasma-protein metabolism, not elsewhere classified: Secondary | ICD-10-CM | POA: Insufficient documentation

## 2016-03-27 DIAGNOSIS — Z95828 Presence of other vascular implants and grafts: Secondary | ICD-10-CM

## 2016-03-27 DIAGNOSIS — Z510 Encounter for antineoplastic radiation therapy: Secondary | ICD-10-CM | POA: Diagnosis not present

## 2016-03-27 MED ORDER — HEPARIN SOD (PORK) LOCK FLUSH 100 UNIT/ML IV SOLN
500.0000 [IU] | Freq: Once | INTRAVENOUS | Status: AC | PRN
Start: 2016-03-27 — End: 2016-03-27
  Administered 2016-03-27: 500 [IU] via INTRAVENOUS
  Filled 2016-03-27: qty 5

## 2016-03-27 MED ORDER — SODIUM CHLORIDE 0.9 % IJ SOLN
10.0000 mL | INTRAMUSCULAR | Status: DC | PRN
  Administered 2016-03-27: 10 mL via INTRAVENOUS
  Filled 2016-03-27: qty 10

## 2016-03-27 MED ORDER — SODIUM CHLORIDE 0.9 % IV SOLN
1000.0000 mL | Freq: Once | INTRAVENOUS | Status: AC
  Administered 2016-03-27: 1000 mL via INTRAVENOUS

## 2016-03-27 NOTE — Progress Notes (Signed)
SYMPTOM MANAGEMENT CLINIC    Chief Complaint: Nausea, dehydration, weakness  HPI:  Maria Pruitt 70 y.o. female diagnosed with breast cancer with both bone and adrenal metastasis.  Currently undergoing radiation treatments.  Patient is complaining of nausea and minimal appetite.  She has had decreased oral intake and feels dehydrated today.  She denies any recent fevers or chills.   No history exists.    Review of Systems  Constitutional: Positive for weight loss and malaise/fatigue.  Neurological: Positive for weakness.  All other systems reviewed and are negative.   Past Medical History  Diagnosis Date  . Hypertension   . Breast cancer ([place]) 2011    Stage 3; recurrence in 2016 with adrenal metastases  . Atrial fibrillation ([place])   . Hypercholesterolemia   . Abnormal ECG   . Allergy     seasonal  . Arthritis   . GERD (gastroesophageal reflux disease)   . 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 Seizures ([place])   . Stroke ([place])     dec 15  . Thyroid disease   . Fatty liver   . Influenza A   . Pituitary adenoma [facility]     s/p surgery and radiation in the 1980s    Past Surgical History  Procedure Laterality Date  . Cesarean section  1980  . Breast enhancement surgery  1987  . Umbilical hernia repair    . Tubal ligation    . Scar revision    . Pituitary surgery      adenoma  . Rhinoplasty  1985  . Breast lumpectomy Left   . Colonoscopy      has HYPERTENSION; Atrial fibrillation with RVR, CHADS 2-3  ([place]); Breast cancer of upper-outer quadrant of left female breast ([place]); Stroke occurring within last month; Adrenal mass, left ([place]); Breast cancer metastasized to bone [facility] Anemia of chronic disease; Acute kidney injury ([place]); Acute septic pulmonary embolism without acute cor pulmonale (HCC); PE (pulmonary embolism); Malignant neoplasm of central portion of right female breast ([place]); Cirrhosis, nonalcoholic ([place]); Port catheter in place;  Uncontrolled pain; Metastasis to adrenal gland ([place]); Nausea with vomiting; Dehydration; Hypotension; Hyperbilirubinemia; and Hypoalbuminemia due to protein-calorie malnutrition ([place]) on her problem list.    is allergic to ciprofloxacin; clonidine derivatives; phenytoin; sulfa antibiotics; benzonatate; and codeine.    Medication List       This list is accurate as of: 03/26/16 11:59 PM.  Always use your most recent med list.               aspirin 81 MG chewable tablet  Commonly known as:  ASPIRIN CHILDRENS  Chew 1 tablet (81 mg total) by mouth daily.     famotidine 20 MG tablet  Commonly known as:  PEPCID  Take 1 tablet (20 mg total) by mouth 2 (two) times daily.     hyaluronate sodium Gel  Apply 1 application topically daily. Reported on 03/26/2016     levothyroxine 100 MCG tablet  Commonly known as:  SYNTHROID, LEVOTHROID  Take 88 mcg by mouth daily before breakfast.     megestrol 625 MG/5ML suspension  Commonly known as:  MEGACE ES  Take 5 mLs (625 mg total) by mouth daily.     metoCLOPramide 5 MG tablet  Commonly known as:  REGLAN  Take 1 tablet (5 mg total) by mouth 3 (three) times daily as needed for nausea.     metoprolol tartrate 25 MG tablet  Commonly  known as:  LOPRESSOR  Take 1 tablet (25 mg total) by mouth daily.     morphine 15 MG tablet  Commonly known as:  MSIR  Take 1 tablet (15 mg total) by mouth every 3 (three) hours as needed for moderate pain or severe pain.     morphine 15 MG 12 hr tablet  Commonly known as:  MS CONTIN  Take 15-30 mg in AM and 30 mg in PM     ondansetron 8 MG disintegrating tablet  Commonly known as:  ZOFRAN ODT  Take 1 tablet (8 mg total) by mouth every 8 (eight) hours as needed for nausea.     polyethylene glycol powder powder  Commonly known as:  MIRALAX  Take 255 g by mouth once.     prochlorperazine 10 MG tablet  Commonly known as:  COMPAZINE  Take 1 tablet (10 mg total) by mouth every 6 (six) hours as needed for  nausea or vomiting.     promethazine 25 MG suppository  Commonly known as:  PHENERGAN  Place 1 suppository (25 mg total) rectally every 6 (six) hours as needed for nausea or vomiting.     senna 8.6 MG tablet  Commonly known as:  SENOKOT  Take 8.6 tablets by mouth 2 (two) times daily. takes 2 bid     vitamin C 100 MG tablet  Take 1,000 mg by mouth daily. Reported on 03/26/2016         PHYSICAL EXAMINATION  Oncology Vitals 03/27/2016 03/27/2016  Weight - -  Weight (lbs) - -  Temp 98 97.8  Pulse 70 75  Resp 16 17  SpO2 100 100   BP Readings from Last 2 Encounters:  03/27/16 102/53  03/26/16 91/53    Physical Exam  Constitutional: She is oriented to person, place, and time. She appears malnourished and dehydrated. She appears unhealthy. She appears cachectic.  HENT:  Head: Normocephalic and atraumatic.  Mouth/Throat: Oropharynx is clear and moist.  Eyes: Conjunctivae and EOM are normal. Pupils are equal, round, and reactive to light. Right eye exhibits no discharge. Left eye exhibits no discharge. No scleral icterus.  Neck: Normal range of motion. Neck supple. No JVD present. No tracheal deviation present. No thyromegaly present.  Cardiovascular: Normal rate, regular rhythm, normal heart sounds and intact distal pulses.   Pulmonary/Chest: Effort normal and breath sounds normal. No respiratory distress. She has no wheezes. She has no rales. She exhibits no tenderness.  Abdominal: Soft. Bowel sounds are normal. She exhibits no distension and no mass. There is no tenderness. There is no rebound and no guarding.  Musculoskeletal: Normal range of motion. She exhibits no edema or tenderness.  Lymphadenopathy:    She has no cervical adenopathy.  Neurological: She is alert and oriented to person, place, and time.  Skin: Skin is warm and dry. No rash noted. No erythema. There is pallor.  Psychiatric: Affect normal.  Nursing note and vitals reviewed.   LABORATORY DATA:. Appointment  on 03/26/2016  Component Date Value Ref Range Status  . WBC 03/26/2016 7.9  3.9 - 10.3 10e3/uL Final  . NEUT# 03/26/2016 6.6* 1.5 - 6.5 10e3/uL Final  . HGB 03/26/2016 12.2  11.6 - 15.9 g/dL Final  . HCT 03/26/2016 35.2  34.8 - 46.6 % Final  . Platelets 03/26/2016 261  145 - 400 10e3/uL Final  . MCV 03/26/2016 101.0  79.5 - 101.0 fL Final  . MCH 03/26/2016 34.9* 25.1 - 34.0 pg Final  . MCHC 03/26/2016 34.6  31.5 - 36.0 g/dL Final  . RBC 03/26/2016 3.49* 3.70 - 5.45 10e6/uL Final  . RDW 03/26/2016 13.9  11.2 - 14.5 % Final  . lymph# 03/26/2016 0.3* 0.9 - 3.3 10e3/uL Final  . MONO# 03/26/2016 0.8  0.1 - 0.9 10e3/uL Final  . Eosinophils Absolute 03/26/2016 0.0  0.0 - 0.5 10e3/uL Final  . Basophils Absolute 03/26/2016 0.0  0.0 - 0.1 10e3/uL Final  . NEUT% 03/26/2016 84.1* 38.4 - 76.8 % Final  . LYMPH% 03/26/2016 4.3* 14.0 - 49.7 % Final  . MONO% 03/26/2016 10.6  0.0 - 14.0 % Final  . EOS% 03/26/2016 0.6  0.0 - 7.0 % Final  . BASO% 03/26/2016 0.4  0.0 - 2.0 % Final  . Sodium 03/26/2016 127* 136 - 145 mEq/L Final  . Potassium 03/26/2016 3.6  3.5 - 5.1 mEq/L Final  . Chloride 03/26/2016 95* 98 - 109 mEq/L Final  . CO2 03/26/2016 20* 22 - 29 mEq/L Final  . Glucose 03/26/2016 114  70 - 140 mg/dl Final   Glucose reference range is for nonfasting patients. Fasting glucose reference range is 70- 100.  Marland Kitchen BUN 03/26/2016 10.0  7.0 - 26.0 mg/dL Final  . Creatinine 03/26/2016 0.6  0.6 - 1.1 mg/dL Final  . Total Bilirubin 03/26/2016 1.80* 0.20 - 1.20 mg/dL Final  . Alkaline Phosphatase 03/26/2016 211* 40 - 150 U/L Final  . AST 03/26/2016 20  5 - 34 U/L Final  . ALT 03/26/2016 13  0 - 55 U/L Final  . Total Protein 03/26/2016 6.9  6.4 - 8.3 g/dL Final  . Albumin 03/26/2016 2.6* 3.5 - 5.0 g/dL Final  . Calcium 03/26/2016 7.8* 8.4 - 10.4 mg/dL Final  . Anion Gap 03/26/2016 12* 3 - 11 mEq/L Final  . EGFR 03/26/2016 >90  >90 ml/min/1.73 m2 Final   eGFR is calculated using the CKD-EPI Creatinine  Equation (2009)    RADIOGRAPHIC STUDIES: No results found.  ASSESSMENT/PLAN:    Nausea with vomiting Patient continues with chronic nausea; but has not vomited recently.  She has anti--nausea medication at home to take on an as-needed basis.  She received IV Zofran while at the cancer center as well.  Hypotension Blood pressure slightly improved today at  96/51.  Patient continues to complain of chronic dehydration and generalized weakness.  Confirmed the patient is not taking any blood pressure medications at this time.  Patient will receive IV fluid rehydration while cancer Center today.  Hypoalbuminemia due to protein-calorie malnutrition (HCC) Albumin continues to be decreased at 2.6 today.  Patient was encouraged to push protein in her diet is much as possible.  Hyperbilirubinemia Bilirubin continues elevated at 1.8 chronically.  Will continue to monitor closely.  Dehydration Patient continues with nausea; but no recent vomiting.  She has had decreased oral intake secondary to no appetite.  She feels dehydrated today.  Patient's sodium continues low at 127 which is chronic for the patient.  The wil patient l receive IV fluid rehydration today.  Also, patient will return tomorrow for additional IV fluid rehydration.  She was encouraged to push fluids at home since possible.  Breast cancer of upper-outer quadrant of left female breast [facility]) Patient will receive her final radiation treatment tomorrow 03/27/2016.  Patient states that she has 30 minute once with the hospice nurse in her home; that is awaiting completion of her radiation treatments tomorrow before she becomes an actual hospice patient.  Confirmed again the patient does wish to proceed with the hospice program.  This provider called the hosp[facility] at # [phone] to confirm that patient will complete her radiation treatments on 03/27/2016; and then would appreciate a hospice visit, and her home for further  evaluation and management.  Patient is scheduled for radiation oncology follow-up on 05/08/2016.  She has no follow-up otherwise with Dr. Jana Hakim.    Patient stated understanding of all instructions; and was in agreement with this plan of care. The patient knows to call the clinic with any problems, questions or concerns.   Total time spent with patient was 25 minutes;  with greater than 75 percent of that time spent in face to face counseling regarding patient's symptoms,  and coordination of care and follow up.  Disclaimer:This dictation was prepared with Dragon/digital dictation along with [employer]. Any transcriptional errors that result from this process are unintentional.  Drue Second, NP 03/27/2016

## 2016-03-27 NOTE — Patient Instructions (Signed)
Dehydration, Adult Dehydration is when you lose more fluids from the body than you take in. Vital organs like the kidneys, brain, and heart cannot function without a proper amount of fluids and salt. Any loss of fluids from the body can cause dehydration.  CAUSES   Vomiting.  Diarrhea.  Excessive sweating.  Excessive urine output.  Fever. SYMPTOMS  Mild dehydration  Thirst.  Dry lips.  Slightly dry mouth. Moderate dehydration  Very dry mouth.  Sunken eyes.  Skin does not bounce back quickly when lightly pinched and released.  Dark urine and decreased urine production.  Decreased tear production.  Headache. Severe dehydration  Very dry mouth.  Extreme thirst.  Rapid, weak pulse (more than 100 beats per minute at rest).  Cold hands and feet.  Not able to sweat in spite of heat and temperature.  Rapid breathing.  Blue lips.  Confusion and lethargy.  Difficulty being awakened.  Minimal urine production.  No tears. DIAGNOSIS  Your caregiver will diagnose dehydration based on your symptoms and your exam. Blood and urine tests will help confirm the diagnosis. The diagnostic evaluation should also identify the cause of dehydration. TREATMENT  Treatment of mild or moderate dehydration can often be done at home by increasing the amount of fluids that you drink. It is best to drink small amounts of fluid more often. Drinking too much at one time can make vomiting worse. Refer to the home care instructions below. Severe dehydration needs to be treated at the hospital where you will probably be given intravenous (IV) fluids that contain water and electrolytes. HOME CARE INSTRUCTIONS   Ask your caregiver about specific rehydration instructions.  Drink enough fluids to keep your urine clear or pale yellow.  Drink small amounts frequently if you have nausea and vomiting.  Eat as you normally do.  Avoid:  Foods or drinks high in sugar.  Carbonated  drinks.  Juice.  Extremely hot or cold fluids.  Drinks with caffeine.  Fatty, greasy foods.  Alcohol.  Tobacco.  Overeating.  Gelatin desserts.  Wash your hands well to avoid spreading bacteria and viruses.  Only take over-the-counter or prescription medicines for pain, discomfort, or fever as directed by your caregiver.  Ask your caregiver if you should continue all prescribed and over-the-counter medicines.  Keep all follow-up appointments with your caregiver. SEEK MEDICAL CARE IF:  You have abdominal pain and it increases or stays in one area (localizes).  You have a rash, stiff neck, or severe headache.  You are irritable, sleepy, or difficult to awaken.  You are weak, dizzy, or extremely thirsty. SEEK IMMEDIATE MEDICAL CARE IF:   You are unable to keep fluids down or you get worse despite treatment.  You have frequent episodes of vomiting or diarrhea.  You have blood or green matter (bile) in your vomit.  You have blood in your stool or your stool looks black and tarry.  You have not urinated in 6 to 8 hours, or you have only urinated a small amount of very dark urine.  You have a fever.  You faint. MAKE SURE YOU:   Understand these instructions.  Will watch your condition.  Will get help right away if you are not doing well or get worse. Document Released: 08/27/2005 Document Revised: 11/19/2011 Document Reviewed: 04/16/2011 Exit[employer] Patient Information 2015 [employer]. This information is not intended to replace advice given to you by your health care provider. Make sure you discuss any questions you have with your health care   provider.  

## 2016-03-27 NOTE — Assessment & Plan Note (Addendum)
Patient will receive her final radiation treatment tomorrow 03/27/2016.  Patient states that she has 30 minute once with the hospice nurse in her home; that is awaiting completion of her radiation treatments tomorrow before she becomes an actual hospice patient.  Confirmed again the patient does wish to proceed with the hospice program.  This provider called the hospice of [facility] at # [phone] to confirm that patient will complete her radiation treatments on 03/27/2016; and then would appreciate a hospice visit, and her home for further evaluation and management.  Patient is scheduled for radiation oncology follow-up on 05/08/2016.  She has no follow-up otherwise with Dr. Jana Hakim.

## 2016-03-27 NOTE — Assessment & Plan Note (Signed)
Patient continues with chronic nausea; but has not vomited recently.  She has anti--nausea medication at home to take on an as-needed basis.  She received IV Zofran while at the cancer center as well.

## 2016-03-27 NOTE — Assessment & Plan Note (Signed)
Blood pressure slightly improved today at  96/51.  Patient continues to complain of chronic dehydration and generalized weakness.  Confirmed the patient is not taking any blood pressure medications at this time.  Patient will receive IV fluid rehydration while cancer Center today.

## 2016-03-27 NOTE — Assessment & Plan Note (Addendum)
Patient continues with nausea; but no recent vomiting.  She has had decreased oral intake secondary to no appetite.  She feels dehydrated today.  Patient's sodium continues low at 127 which is chronic for the patient.  The wil patient l receive IV fluid rehydration today.  Also, patient will return tomorrow for additional IV fluid rehydration.  She was encouraged to push fluids at home since possible.

## 2016-03-27 NOTE — Assessment & Plan Note (Signed)
Bilirubin continues elevated at 1.8 chronically.  Will continue to monitor closely.

## 2016-03-27 NOTE — Progress Notes (Signed)
Patient was added to schedule in flush 1 upon error already accessed. Was only getting fluids in infusion. Patient appeared upset and wanted to be deaccessed because of appt confusion. Reassured patient that things will be fine. Arrived patient to infusion to nurse.

## 2016-03-27 NOTE — Assessment & Plan Note (Signed)
Albumin continues to be decreased at 2.6 today.  Patient was encouraged to push protein in her diet is much as possible.

## 2016-03-28 ENCOUNTER — Ambulatory Visit: Payer: Medicare Other

## 2016-03-28 ENCOUNTER — Telehealth: Payer: Self-pay | Admitting: Oncology

## 2016-03-28 NOTE — Telephone Encounter (Signed)
Spoke with pt son to confirm 8/24 at 2 pm appt with GM

## 2016-04-12 MED FILL — METOPROLOL TARTRATE 25 MG T: 25 | 30 days supply | Qty: 30 | Fill #1

## 2016-04-25 ENCOUNTER — Other Ambulatory Visit: Payer: Self-pay | Admitting: Nurse Practitioner

## 2016-04-25 DIAGNOSIS — C50912 Malignant neoplasm of unspecified site of left female breast: Secondary | ICD-10-CM

## 2016-04-25 DIAGNOSIS — C7951 Secondary malignant neoplasm of bone: Secondary | ICD-10-CM

## 2016-04-25 DIAGNOSIS — Z95828 Presence of other vascular implants and grafts: Secondary | ICD-10-CM

## 2016-04-26 NOTE — Progress Notes (Signed)
°  Radiation Oncology         [phone] ________________________________  Name: Maria Pruitt MRN: [id number]  Date: 03/27/2016  DOB: May 20, 1946  End of Treatment Note  Diagnosis:      ICD-9-CM ICD-10-CM   1. Malignant neoplasm metastatic to left adrenal gland ([place]) 198.7 C79.72        Indication for treatment:  Palliative       Radiation treatment dates:   03/14/2016 to 03/27/2016  Site/dose:   The Left adrenal mass 2 cm and Retroperitoneal node 4.3 cm were treated to 30 Gy in 10 fractions at 3 Gy per fraction.   Beams/energy:   3D // 15X  Narrative: The patient tolerated radiation treatment relatively poorly. The patient has been significantly declining over the last 2 weeks of treatment, requiring IV hydration, and was noted to have hyponatremia. The patient is still in the work up process with her endocrinologist for her hyponatremia. She reports poor intake and very little appetite.  Plan: The patient has completed radiation treatment. The patient will return to radiation oncology clinic for routine followup in one month. The patient will follow with endocrinology for hyponatremia. I advised them to call or return sooner if they have any questions or concerns related to their recovery or treatment.  ------------------------------------------------  Jodelle Gross, MD, PhD  This document serves as a record of services personally performed by Kyung Rudd, MD. It was created on his behalf by Arlyce Harman, a trained medical scribe. The creation of this record is based on the scribe's personal observations and the provider's statements to them. This document has been checked and approved by the attending provider.

## 2016-05-03 ENCOUNTER — Ambulatory Visit (HOSPITAL_BASED_OUTPATIENT_CLINIC_OR_DEPARTMENT_OTHER): Admitting: Oncology

## 2016-05-03 ENCOUNTER — Telehealth: Payer: Self-pay | Admitting: Oncology

## 2016-05-03 VITALS — BP 132/91 | HR 94 | Temp 98.1°F | Resp 17 | Ht 62.0 in | Wt 140.6 lb

## 2016-05-03 DIAGNOSIS — C7972 Secondary malignant neoplasm of left adrenal gland: Secondary | ICD-10-CM

## 2016-05-03 DIAGNOSIS — C7971 Secondary malignant neoplasm of right adrenal gland: Secondary | ICD-10-CM

## 2016-05-03 DIAGNOSIS — C7951 Secondary malignant neoplasm of bone: Secondary | ICD-10-CM

## 2016-05-03 DIAGNOSIS — C773 Secondary and unspecified malignant neoplasm of axilla and upper limb lymph nodes: Secondary | ICD-10-CM

## 2016-05-03 DIAGNOSIS — C50111 Malignant neoplasm of central portion of right female breast: Secondary | ICD-10-CM

## 2016-05-03 DIAGNOSIS — Z95828 Presence of other vascular implants and grafts: Secondary | ICD-10-CM

## 2016-05-03 DIAGNOSIS — K7469 Other cirrhosis of liver: Secondary | ICD-10-CM

## 2016-05-03 DIAGNOSIS — C50912 Malignant neoplasm of unspecified site of left female breast: Secondary | ICD-10-CM

## 2016-05-03 DIAGNOSIS — C50412 Malignant neoplasm of upper-outer quadrant of left female breast: Secondary | ICD-10-CM

## 2016-05-03 DIAGNOSIS — C50911 Malignant neoplasm of unspecified site of right female breast: Secondary | ICD-10-CM

## 2016-05-03 MED ORDER — METOPROLOL TARTRATE 25 MG PO TABS: 25.0000 mg | ORAL_TABLET | Freq: Every day | ORAL | 1 refills | Status: AC

## 2016-05-03 MED FILL — METOPROLOL TARTRATE 25 MG T: 25 | 60 days supply | Qty: 60 | Fill #0

## 2016-05-03 NOTE — Telephone Encounter (Signed)
appt made and avs printed °

## 2016-05-03 NOTE — Addendum Note (Signed)
Encounter addended by: Kyung Rudd, MD on: 05/03/2016  5:12 PM<BR>    Actions taken: Visit diagnoses modified, Sign clinical note

## 2016-05-03 NOTE — Progress Notes (Signed)
. Maria Pruitt   DOB: [birth year]  MR#: [id number]  XBJ#:[id number]  Maria Nova, PA-C GYN:  SU:  OTHER MD:  CHIEF COMPLAINT: left adrenal metastatsis  CURRENT TREATMENT: Under hospice care  BREAST CANCER HISTORY:  from the original intake note:  Maria Pruitt herself palpated a change in her left breast, shortly before her mammogram was due.  She brought this to Maria Pruitt attention and he set her up for diagnostic mammography on October 31, 2009.   Routine and implant displaced views of both breasts showed a 2 cm irregular mass in the outer left breast, seen only on the spot tangential view.  The breast tissue itself was fatty.  There were no suspicious calcifications.  This mass was palpable to Maria Pruitt.  An ultrasound showed it to be 1.7 cm irregular and hypoechoic.  There were at least two enlarged left axillary lymph nodes identified.  Maria Pruitt recommended biopsy which was performed the same day and showed ([id number]) and invasive breast cancer felt most likely to be ductal with lobular features.  The left axillary lymph node biopsy also showed the same tumor (similar morphologic findings) and both prognostic profiles showed the cancer to be strongly ER and PR positive with a low to borderline proliferation fraction (13 and 14%).  Neither mass showed amplification of Her-2 by CISH.  (The ratios were 1.1 and 0.85.)    With this information, the patient was referred to Maria Pruitt and bilateral breast MRIs were obtained March 1st.  This showed several left axillary lymph nodes with cortical thickening, the largest one measuring 1.1 cm.  The mass itself measured 2.9 cm by MRI.  It was spiculated and irregular.  There was no evidence of contralateral disease.   She was treated neoadjuvantly with docetaxel and cyclophosphamide for 6 cycles completed in July 2011, after which she underwent left lumpectomy and axillary dissection August 2011 for a ypT1b yTN3 (14 positive lymph nodes),  Stage IIIC, grade 1 residual tumor. HER-2/neu was repeated, again not amplified. After completing local regional radiation October 2011 she started letrozole November 2011, continued to September 2016, which she was found to have metastatic disease   METASTATIC DISEASE HISTORY: From the 06/08/2015 summary:   Maria Pruitt returns today for a new problem accompanied by her sister Maria Pruitt. To summarize the recent history: She had a hemorrhagic stroke in January 2016 with some residuals. She has been living next door to her son in the [place] area since then.--In August she presented to her primary care physician, Maria Pruitt, with a complaint of abdominal discomfort, nausea and vomiting. He treated her for a UTI w/o resolution and obtained a KUB in his office; this was nondiagnostic. Accordingly he set up the patient for CT scan of the abdomen and pelvis 05/06/2015. This showed the left adrenal gland to be enlarged to 7.5 x 4.0 cm. There was eccentric wall thickening of the gastric cardia. There were small lymph nodes in the upper abdomen and retroperitoneum. There was no liver involvement. The right adrenal gland was unremarkable. There was severe left kidney atrophy (congenital).  The patient was then set up for biopsy of the left adrenal mass on 05/30/2015. This showed Maria Pruitt [id number]) metastatic carcinoma which was cytokeratin 7 positive, with rare cytokeratin 20 positivity and was negative for gross cystic disease fluid protein. The prognostic panel showed this tumor to be estrogen and progesterone receptor negative, HER-2 negative, with an MIB-1 of 90-100%.   PET scan10/03/2015 which showed in additional to  the left adrenal mass, some regional lymph nodes and multiple bone lesions. There was no obvious primary tumor. She also had an EGD to evaluate a possible gastric primary suggested by the yearly a CT scan of the chest. This was normal. We also obtained tumor markerswhich showed a normal CA 19,a mildly  elevated CEA at 9.7,Maria Pruitt is significantly elevated CA-27-29 at 125. Given this data, the most likely interpretation is that we are dealing with an estrogen receptor negative recurrence of her earlier estrogen receptor positive breast cancer, now stage IV  Her subsequent history is as detailed below  INTERVAL HISTORY: Maria Pruitt returns today for follow-up of her stage IV breast cancer, accompanied by her daughter in law. Since her last visit with me she completed her radiation treatments. She is now back under hospice care. The nurses visit once a week. She says it is usually a different nursing each time. She says they are "okay". She does not resent they're coming.  REVIEW OF SYSTEMS: [name] was eating poorly. The family started her on hemp oil and that has increased her appetite. Her pain improved after radiation and currently she is only taking OxyContin in the morning, usually with no breakthrough pain medication for the rest of the day. Maria Pruitt usually gets up around 8, makes her husband his extensive breakfast, and then "is lazy". She does a little walking and a little washing close and washing dishes. If other people want to do it for her she let's them. She would like to go to the beach with her husband. She thinks she would walk they're more. She is not drinking enough fluid and she wears that she might need fluids and hospice will give them to her. Aside from these issues a detailed review of systems today was stable  PAST MEDICAL HISTORY: Past Medical History:  Diagnosis Date  . Abnormal ECG   . Allergy    seasonal  . Arthritis   . Atrial fibrillation ([place])   . Breast cancer ([place]) 2011   Stage 3; recurrence in 2016 with adrenal metastases  . Chronic kidney disease    only one kidney from birth  . Fatty liver   . GERD (gastroesophageal reflux disease)   . Hypercholesterolemia   . Hypertension   . Influenza A   . Pituitary adenoma ([place])    s/p surgery and radiation in the 1980s   . Seizures ([place])   . Stroke ([place])    dec 15  . Thyroid disease    1. Significant for pituitary adenoma which was removed in 1983 but recurred, requiring radiation and briefly some Parlodel.  She has been off treatment since 1985.  She has some acromegaly secondary to this tumor.   2. She is status post bilateral submuscular breast implants under Dr. Towanda Malkin in 1987.   3. She is status post C-section for her third child who was hydrocephalic.   4. She underwent rhinoplasty in 1984.   5. She has a history of hypertension. 6. History of mitral valve prolapse, but she says she has not been receiving antibiotics prior to dental or similar procedures.   7. History of hypothyroidism.   8. History of congenital absence of one kidney.   9. History of hyperlipidemia.  10. History of obesity.   11. History of renal stones.   12. History of fatty liver.   13. History of gout.   14. History of palpitations.   History of childhood asthma  PAST SURGICAL HISTORY: Past Surgical History:  Procedure Laterality Date  . BREAST ENHANCEMENT SURGERY  1987  . BREAST LUMPECTOMY Left   . CESAREAN SECTION  1980  . COLONOSCOPY    . PITUITARY SURGERY     adenoma  . RHINOPLASTY  1985  . SCAR REVISION    . TUBAL LIGATION    . UMBILICAL HERNIA REPAIR      FAMILY HISTORY Family History  Problem Relation Age of Onset  . Heart disease Mother   . Heart attack Father   . Heart attack Brother   . Colon cancer Neg Hx    The patient's father died from a myocardial infarction at the age of 3.  The patient's mother died from complications of congestive heart failure at the age of 76.  The patient had one brother who died from a myocardial infarction at the age of 83.  One of the brothers and two sisters are alive and well.  Sr. Maria Pruitt is an ICU nurse. There is no history of breast or ovarian cancer in the family to her knowledge.  GYNECOLOGIC HISTORY: She is GX P3, first pregnancy to term at age 14.  She  went through the change of life at the time of her pituitary surgery in 1983.  She took hormone replacement for less than a year because of poor tolerance.    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  She gave up her job in 2010-08-23.  Her first husband died from chronic myeloid leukemia in 1985.  Her three children from that marriage include her son Randall Hiss who died from complications of hydrocephaly at age 22; son [name] who is a Insurance underwriter and son [name] who is an Public house manager in this area.  The patient has five grandchildren.  Her second husband of 20+ years, Ron, is a Company secretary of an independent [employer] and also does Writer and is a Oceanographer. He is now disabled due toa traumatic brain injury. He lost his first wife to endometrial cancer.  He has a daughter from that marriage.  She lives in [place]: At the 03/16/2016 visit with the patient's 2 children and daughter-in-law present the family and patient agreed that in case of a terminal event she should not be resuscitated.  HEALTH MAINTENANCE: Social History  Substance Use Topics  . Smoking status: Never Smoker  . Smokeless tobacco: Never Used  . Alcohol use No     Colonoscopy:  PAP:  Bone density: January 2012/ osteopenia  Lipid panel:  Allergies  Allergen Reactions  . Ciprofloxacin Other (See Comments)  . Clonidine Derivatives Other (See Comments)  . Phenytoin     Other reaction(s): Other Elevated LFTs  . Sulfa Antibiotics Hives  . Benzonatate     REACTION: Swelling, tessalon perles; tolerates benadryl  . Codeine     REACTION: Nausea    Current Outpatient Prescriptions  Medication Sig Dispense Refill  . Ascorbic Acid (VITAMIN C) 100 MG tablet Take 1,000 mg by mouth daily. Reported on 03/26/2016    . aspirin (ASPIRIN CHILDRENS) 81 MG chewable tablet Chew 1 tablet (81 mg total) by mouth daily. 100 tablet 4  .  famotidine (PEPCID) 20 MG tablet Take 1 tablet (20 mg total) by mouth 2 (two) times daily. 180 tablet 4  . hyaluronate sodium (RADIAPLEXRX) GEL Apply 1 application topically daily. Reported on 03/26/2016    . levothyroxine (SYNTHROID, LEVOTHROID) 100 MCG tablet Take 88  mcg by mouth daily before breakfast.    . megestrol (MEGACE ES) 625 MG/5ML suspension Take 5 mLs (625 mg total) by mouth daily. 150 mL 1  . metoCLOPramide (REGLAN) 5 MG tablet Take 1 tablet (5 mg total) by mouth 3 (three) times daily as needed for nausea. 30 tablet 1  . metoprolol tartrate (LOPRESSOR) 25 MG tablet Take 1 tablet (25 mg total) by mouth daily. 30 tablet 1  . morphine (MS CONTIN) 15 MG 12 hr tablet Take 15-30 mg in AM and 30 mg in PM 120 tablet 0  . morphine (MSIR) 15 MG tablet Take 1 tablet (15 mg total) by mouth every 3 (three) hours as needed for moderate pain or severe pain. 60 tablet 0  . ondansetron (ZOFRAN ODT) 8 MG disintegrating tablet Take 1 tablet (8 mg total) by mouth every 8 (eight) hours as needed for nausea. 30 tablet 2  . polyethylene glycol powder (MIRALAX) powder Take 255 g by mouth once. 255 g 0  . prochlorperazine (COMPAZINE) 10 MG tablet Take 1 tablet (10 mg total) by mouth every 6 (six) hours as needed for nausea or vomiting. 30 tablet 2  . promethazine (PHENERGAN) 25 MG suppository Place 1 suppository (25 mg total) rectally every 6 (six) hours as needed for nausea or vomiting. 12 each 0  . senna (SENOKOT) 8.6 MG tablet Take 8.6 tablets by mouth 2 (two) times daily. takes 2 bid     No current facility-administered medications for this visit.     OBJECTIVE: Middle-aged white woman  Vitals:   05/03/16 1432  BP: (!) 132/91  Pulse: 94  Resp: 17  Temp: 98.1 F (36.7 C)     Body mass index is 25.72 kg/m.    ECOG FS: 2  Sclerae unicteric, pupils round and equal Oropharynx clear, slightly dry No cervical or supraclavicular adenopathy Lungs no rales or rhonchi Heart regular rate and rhythm Abd  soft, nontender, positive bowel sounds MSK no focal spinal tenderness, no upper extremity lymphedema Neuro: nonfocal, well oriented, appropriate affect Breasts: Deferred    LAB RESULTS:  CBC Latest Ref Rng & Units 03/26/2016 03/15/2016 03/05/2016  WBC 3.9 - 10.3 10e3/uL 7.9 9.8 7.8  Hemoglobin 11.6 - 15.9 g/dL 12.2 12.9 12.7  Hematocrit 34.8 - 46.6 % 35.2 36.7 36.0  Platelets 145 - 400 10e3/uL 261 266 285     CMP Latest Ref Rng & Units 03/26/2016 03/15/2016 03/05/2016  Glucose 70 - 140 mg/dl 114 117 133  BUN 7.0 - 26.0 mg/dL 10.0 9.8 7.9  Creatinine 0.6 - 1.1 mg/dL 0.6 0.8 0.7  Sodium 136 - 145 mEq/L 127(L) 126(L) 125(L)  Potassium 3.5 - 5.1 mEq/L 3.6 3.6 4.1  Chloride 101 - 111 mmol/L - - -  CO2 22 - 29 mEq/L 20(L) 22 23  Calcium 8.4 - 10.4 mg/dL 7.8(L) 8.3(L) 9.3  Total Protein 6.4 - 8.3 g/dL 6.9 6.9 7.1  Total Bilirubin 0.20 - 1.20 mg/dL 1.80(H) 1.48(H) 1.69(H)  Alkaline Phos 40 - 150 U/L 211(H) 226(H) 148  AST 5 - 34 U/L 20 36(H) 18  ALT 0 - 55 U/L 13 26 16     STUDIES: No results found.   ASSESSMENT: 70 y.o. [place], [place] woman  (1) status post left breast biopsy in February of 2011 for an invasive lobular carcinoma measuring 2.9 cm by MRI, with a positive prechemotherapy axillary lymph node biopsy, strongly estrogen and progesterone receptor positive with no evidence of HER-2/neu amplification and an MIB-1 of 14%.   (  2) received 6 cycles of docetaxel/ cyclophosphamide in the neoadjuvant setting, completed in mid July of 2011   (3) s/p left lumpectomy and axillary lymph node dissection in August of 2011 for a ypT1b yTN3 (14 positive lymph nodes), Stage IIIC, grade 1 residual tumor. HER-2/neu was repeated, again not amplified.   (4) Completed loco-regional radiation in late October of 2011   (5) on letrozole starting early November 2011, continued to October 2016, with progression  METASTATIC DISEASE: September 2016: Involving left adrenal gland, regional nodes  and bones  (6) biopsy of a 7.5 cm left adrenal mass 05/30/2015 showed metastatic carcinoma, estrogen, progesterone, and HER-2 negative, with an MIB-1 of 90-100%; the tumor cells were cytokeratin 7 positive, cytokeratin 20 focally positive, gross cystic disease fluid protein negative  (a) the CA-27-29 is informative  (b) PET scan 06/17/2015 shows, in addition to the left adrenal mass, some periaortic adenopathy and multiple hypermetabolic bone metastases (L2, T9, T2 and others)  (c) EGD on 06/16/2015 to evaluate suspicious cardiac wall thickening showed no evidence of malignancy  (7) capecitabine started 07/04/2015 at 1.5 g BID 7/7. Discontinued 08/29/15 with progression  (8) denosumab/ Xgeva started 07/18/2015, repeated monthly  (9) eribulin D1/D8 q 21 days started 09/23/14. 1 complete cycle given before hospitalization. Discontinued due to elevation in LFTs.   (10) hospitalized for infuenza A on 09/26/15. Pulmonary embolus found. Lovenox started daily.  (11) hepatic encephalopathy during hospital stay in January 2017. Lactulose completed. Awaiting approval of rifaximin due to financial strain.   (12) carboplatin d1/d8 every 21 days started 11/07/2015  (a) day 8 cycle 3 was held due to an increase in the serum creatinine  (b) carboplatin discontinued after 02/20/2016 dose because of progression  (13) palliative radiation to left adrenal area 03/14/2016 to 03/27/2016: Site/dose:   The Left adrenal mass 2 cm and Retroperitoneal node 4.3 cm were treated to 30 Gy in 10 fractions at 3 Gy per fraction.    (14) uncontrolled pain: Started on MS Contin 30 mg twice daily beginning 03/05/2016, with MSIR 15 mg every 3 hours as needed  (a) bowel prophylaxis includes stool softeners 2 tablets twice daily, with MiraLAX daily as needed  (15) switched to best supportive care and hospice referral placed 03/16/2016  OTHER PROBLEMS: (a) the patient is status post hemorrhagic CVA January 2016  (b) congenital  absence of left kidney  (c) non alcoholic cirrhosis  (d) NSTEMI 01/31/16 treated at [facility]. Cardiac cath performed. Started on aspirin and plavix daily.   PLAN: Susane is declining slowly. I do believe the radiation was helpful and she is having less pain. This in turn means less pain medicine and then less complications from the pain medicine. For example she is eating a little bit better and not being so constipated.  If there was one thing that I suggest that she work on is drinking. I suggest that she really needs to drink a quart and a half of fluid a day. Water does not taste good. She can doctorate up with lemon juice, or she can use none caffeine aided teas, or drink soda water instead of plain water. They're going to work on this.  In fact the dehydration issue is the source of contention with hospice. The hospice nurses have told him that they don't usually do fluids at home and does wear his Meili: Want if she becomes severely dehydrated. I told her to call us and she can come here and we will be glad to give  her fluids if the need arises. Hopefully that will relieve that concern.  Otherwise she will return to see me in approximately 6-8 weeks. They know to call for any problems that may develop before the next visit.  [facility] does have it out facility DO NOT RESUSCITATE in place and would not be resuscitated in case of a terminal event.      Maria Cruel, MD 05/03/2016 2:37 PM   Medical Oncology and Hematology [facility] [address] [place], [place] [address] Tel. [phone]    Fax. [phone]

## 2016-05-03 NOTE — Progress Notes (Addendum)
  Radiation Oncology         [phone] ________________________________  Name: Maria Pruitt MRN: [id number]  Date: 03/07/2016  DOB: [birth year]  SIMULATION AND TREATMENT PLANNING NOTE  DIAGNOSIS:     ICD-9-CM ICD-10-CM   1. Malignant neoplasm metastatic to left adrenal gland (HCC) 198.7 C79.72      Site:  Left adrenal gland  NARRATIVE:  The patient was brought to the [facility].  Identity was confirmed.  All relevant records and images related to the planned course of therapy were reviewed.   Written consent to proceed with treatment was confirmed which was freely given after reviewing the details related to the planned course of therapy had been reviewed with the patient.  Then, the patient was set-up in a stable reproducible  supine position for radiation therapy.  CT images were obtained.  Surface markings were placed.    Medically necessary complex treatment device(s) for immobilization:  Customized vac lock bag.   The CT images were loaded into the planning software.  Then the target and avoidance structures were contoured.  Treatment planning then occurred.  The radiation prescription was entered and confirmed.  A total of 4 complex treatment devices were fabricated which relate to the designed radiation treatment fields. Each of these customized fields/ complex treatment devices will be used on a daily basis during the radiation course. I have requested : 3D Simulation  I have requested a DVH of the following structures: Target volume, left kidney, right kidney, spinal cord.   The patient will undergo daily image guidance to ensure accurate localization of the target, and adequate minimize dose to the normal surrounding structures in close proximity to the target.   PLAN:  The patient will receive 30 Gy in 10 fractions.   ________________________________   Jodelle Gross, MD, PhD

## 2016-05-07 ENCOUNTER — Encounter: Payer: Self-pay | Admitting: Radiation Oncology

## 2016-05-08 ENCOUNTER — Ambulatory Visit: Admission: RE | Admit: 2016-05-08 | Payer: Medicare Other | Source: Ambulatory Visit | Admitting: Radiation Oncology

## 2016-05-08 ENCOUNTER — Telehealth: Payer: Self-pay | Admitting: *Deleted

## 2016-05-08 HISTORY — DX: Personal history of irradiation: Z92.3

## 2016-05-08 NOTE — Telephone Encounter (Signed)
CALLED PATIENT TO INFORM OF FU APPT. FOR 05-08-16 HAS BEEN CANCELLED AND RESCHEDULED FOR 05-22-16 @ 3:15 PM, SPOKE WITH PATIENT'S SISTER AND SHE IS AWARE OF THIS CHANGE

## 2016-05-10 ENCOUNTER — Other Ambulatory Visit: Payer: Self-pay | Admitting: *Deleted

## 2016-05-10 ENCOUNTER — Telehealth: Payer: Self-pay | Admitting: *Deleted

## 2016-05-10 ENCOUNTER — Ambulatory Visit (HOSPITAL_BASED_OUTPATIENT_CLINIC_OR_DEPARTMENT_OTHER)

## 2016-05-10 DIAGNOSIS — E860 Dehydration: Secondary | ICD-10-CM | POA: Diagnosis not present

## 2016-05-10 DIAGNOSIS — C50912 Malignant neoplasm of unspecified site of left female breast: Secondary | ICD-10-CM | POA: Diagnosis not present

## 2016-05-10 DIAGNOSIS — C50919 Malignant neoplasm of unspecified site of unspecified female breast: Secondary | ICD-10-CM

## 2016-05-10 DIAGNOSIS — C7951 Secondary malignant neoplasm of bone: Principal | ICD-10-CM

## 2016-05-10 MED ORDER — SODIUM CHLORIDE 0.9 % IV SOLN
Freq: Once | INTRAVENOUS | Status: AC
  Administered 2016-05-10: 14:00:00 via INTRAVENOUS

## 2016-05-10 MED ORDER — HEPARIN SOD (PORK) LOCK FLUSH 100 UNIT/ML IV SOLN
500.0000 [IU] | Freq: Once | INTRAVENOUS | Status: AC
  Administered 2016-05-10: 500 [IU] via INTRAVENOUS
  Filled 2016-05-10: qty 5

## 2016-05-10 MED ORDER — SODIUM CHLORIDE 0.9% FLUSH
10.0000 mL | Freq: Once | INTRAVENOUS | Status: AC
  Administered 2016-05-10: 10 mL via INTRAVENOUS
  Filled 2016-05-10: qty 10

## 2016-05-10 NOTE — Telephone Encounter (Signed)
Call from patient's sister "Maria Pruitt reporting her sister is super weak and not much intake.  Believe she needs IVF. Verbal order received and read back from Dr. Jana Hakim for 1L Normal Saline today at [facility].  Order given to Infusion Triage nurse and called the Sister at this time.  IVF scheduled today at 2:00 PM.

## 2016-05-10 NOTE — Patient Instructions (Signed)
Dehydration, Adult Dehydration is when you lose more fluids from the body than you take in. Vital organs like the kidneys, brain, and heart cannot function without a proper amount of fluids and salt. Any loss of fluids from the body can cause dehydration.  CAUSES   Vomiting.  Diarrhea.  Excessive sweating.  Excessive urine output.  Fever. SYMPTOMS  Mild dehydration  Thirst.  Dry lips.  Slightly dry mouth. Moderate dehydration  Very dry mouth.  Sunken eyes.  Skin does not bounce back quickly when lightly pinched and released.  Dark urine and decreased urine production.  Decreased tear production.  Headache. Severe dehydration  Very dry mouth.  Extreme thirst.  Rapid, weak pulse (more than 100 beats per minute at rest).  Cold hands and feet.  Not able to sweat in spite of heat and temperature.  Rapid breathing.  Blue lips.  Confusion and lethargy.  Difficulty being awakened.  Minimal urine production.  No tears. DIAGNOSIS  Your caregiver will diagnose dehydration based on your symptoms and your exam. Blood and urine tests will help confirm the diagnosis. The diagnostic evaluation should also identify the cause of dehydration. TREATMENT  Treatment of mild or moderate dehydration can often be done at home by increasing the amount of fluids that you drink. It is best to drink small amounts of fluid more often. Drinking too much at one time can make vomiting worse. Refer to the home care instructions below. Severe dehydration needs to be treated at the hospital where you will probably be given intravenous (IV) fluids that contain water and electrolytes. HOME CARE INSTRUCTIONS   Ask your caregiver about specific rehydration instructions.  Drink enough fluids to keep your urine clear or pale yellow.  Drink small amounts frequently if you have nausea and vomiting.  Eat as you normally do.  Avoid:  Foods or drinks high in sugar.  Carbonated  drinks.  Juice.  Extremely hot or cold fluids.  Drinks with caffeine.  Fatty, greasy foods.  Alcohol.  Tobacco.  Overeating.  Gelatin desserts.  Wash your hands well to avoid spreading bacteria and viruses.  Only take over-the-counter or prescription medicines for pain, discomfort, or fever as directed by your caregiver.  Ask your caregiver if you should continue all prescribed and over-the-counter medicines.  Keep all follow-up appointments with your caregiver. SEEK MEDICAL CARE IF:  You have abdominal pain and it increases or stays in one area (localizes).  You have a rash, stiff neck, or severe headache.  You are irritable, sleepy, or difficult to awaken.  You are weak, dizzy, or extremely thirsty. SEEK IMMEDIATE MEDICAL CARE IF:   You are unable to keep fluids down or you get worse despite treatment.  You have frequent episodes of vomiting or diarrhea.  You have blood or green matter (bile) in your vomit.  You have blood in your stool or your stool looks black and tarry.  You have not urinated in 6 to 8 hours, or you have only urinated a small amount of very dark urine.  You have a fever.  You faint. MAKE SURE YOU:   Understand these instructions.  Will watch your condition.  Will get help right away if you are not doing well or get worse. Document Released: 08/27/2005 Document Revised: 11/19/2011 Document Reviewed: 04/16/2011 Exit[employer] Patient Information 2015 [employer]. This information is not intended to replace advice given to you by your health care provider. Make sure you discuss any questions you have with your health care   provider.  

## 2016-05-13 NOTE — Progress Notes (Signed)
error 

## 2016-05-15 ENCOUNTER — Telehealth: Payer: Self-pay | Admitting: Nurse Practitioner

## 2016-05-15 NOTE — Telephone Encounter (Signed)
Received phone message dated 05/11/16 at 8:29 pm from [facility] [place] [facility] that patient has revoked her hospice benefit as of 05/11/16 due to wanting to continue receiving IV fluids and "other measures" that are not covered under hospice. Dr. Jana Hakim notified. Patient has next MD apt scheduled 06/20/16.

## 2016-05-22 ENCOUNTER — Inpatient Hospital Stay: Admission: RE | Admit: 2016-05-22 | Payer: Medicare Other | Source: Ambulatory Visit | Admitting: Radiation Oncology

## 2016-05-22 ENCOUNTER — Ambulatory Visit: Admission: RE | Admit: 2016-05-22 | Payer: Medicare Other | Source: Ambulatory Visit | Admitting: Radiation Oncology

## 2016-05-29 ENCOUNTER — Other Ambulatory Visit: Payer: Self-pay | Admitting: *Deleted

## 2016-05-29 MED ORDER — METOCLOPRAMIDE HCL 5 MG PO TABS: 5.0000 mg | ORAL_TABLET | Freq: Three times a day (TID) | ORAL | 1 refills | Status: AC | PRN

## 2016-06-16 ENCOUNTER — Other Ambulatory Visit: Payer: Self-pay | Admitting: Oncology

## 2016-06-18 ENCOUNTER — Telehealth: Payer: Self-pay | Admitting: *Deleted

## 2016-06-18 ENCOUNTER — Other Ambulatory Visit: Payer: Self-pay | Admitting: *Deleted

## 2016-06-18 MED ORDER — MORPHINE SULFATE (CONCENTRATE) 10 MG /0.5 ML PO SOLN: ORAL | 0 refills | Status: AC

## 2016-06-18 NOTE — Telephone Encounter (Signed)
This RN spoke with pt's hospice nurse Olin Hauser per her call stating need to convert pt from MS Contin 15 mg and MSIR to liquid -   Per MD order given for Roxanol 20mg /ml with starting dose of 5-10 mg ( 1/4 -1/2 ml ) q 1-2 hours prn.  Olin Hauser states orders can be written by her through the hospice pharmacy and delivered today.  No other needs at this time.

## 2016-06-20 ENCOUNTER — Ambulatory Visit: Payer: Medicare Other | Admitting: Oncology

## 2016-06-26 ENCOUNTER — Telehealth: Payer: Self-pay | Admitting: *Deleted

## 2016-06-26 NOTE — Telephone Encounter (Signed)
This RN received call from [place] with [facility].  She states pt is " approaching end of life with inability to swallow medications in tablet/capsule form "  Request per discussion with family - including sister who is a nurse is to d/c above forms of meds.  Maintain comfort med per MSO4 elixer in home but also obtain liquid haldol, lorazepam and atropine.  Per discussion - [facility] will institute above - prescriptions will be called to the hospice pharmacy and orders sent for MD signiture.  No other needs at this time.  Return call for [facility] given as [phone].

## 2016-07-11 DEATH — deceased

## 2017-03-30 IMAGING — MR MR ABDOMEN WO/W CM
10 of 19 series · 20 of 48 positions shown · IV contrast (multihance)
Comparison: PET-CT 06/17/2015.

CLINICAL DATA: 69-year-old female with history of metastatic breast
cancer. Known bone metastasis, metastatic periaortic adenopathy in
the upper abdomen and left adrenal gland metastasis.

EXAM:
MRI ABDOMEN WITHOUT AND WITH CONTRAST
TECHNIQUE: Multiplanar multisequence MR imaging of the abdomen was performed
both before and after the administration of intravenous contrast.
CONTRAST:  15mL MULTIHANCE GADOBENATE DIMEGLUMINE 529 MG/ML IV SOLN

[Series 4: T2 fat-sat · axial · 5.0mm · 0.78mm/px · z∈[-29,+191]mm · 3 of 45 slices shown]
[im 1/45]
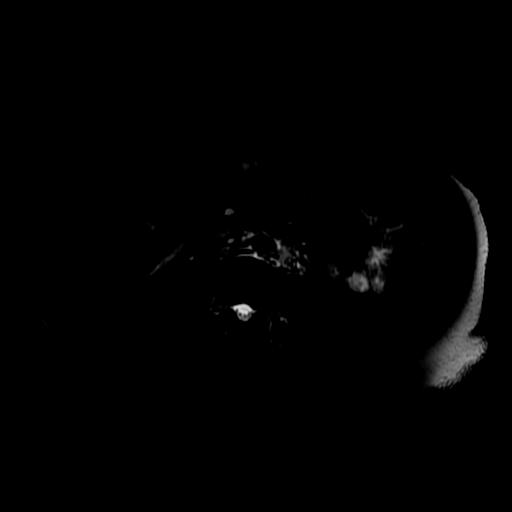
[im 23/45]
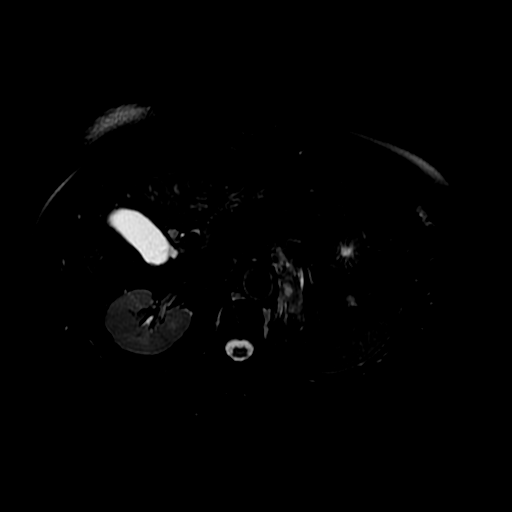
[im 45/45]
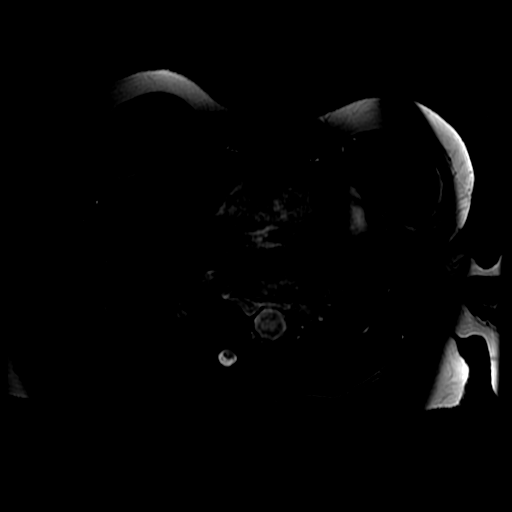

[Series 5: DWI b500 · axial · 6.0mm · 1.48mm/px · z∈[-45,+189]mm · 2 of 62 slices shown]
[im 1/62]
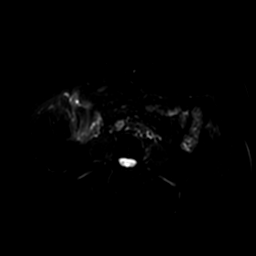
[im 62/62]
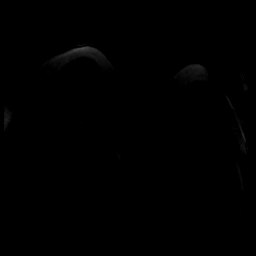

[Series 8: ax dualecho · axial · 5.0mm · 0.78mm/px · z∈[-38,+172]mm · 3 of 86 slices shown]
[im 1/86]
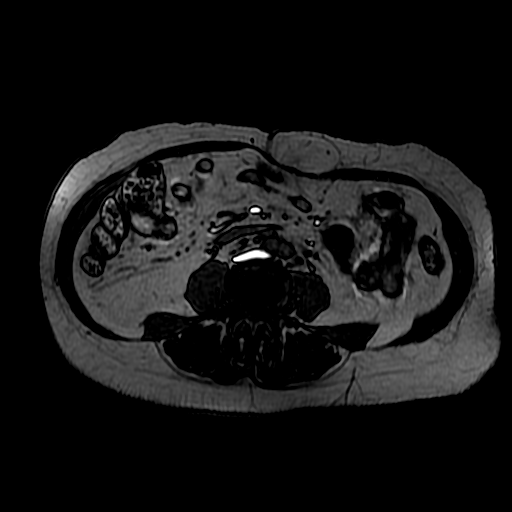
[im 43/86]
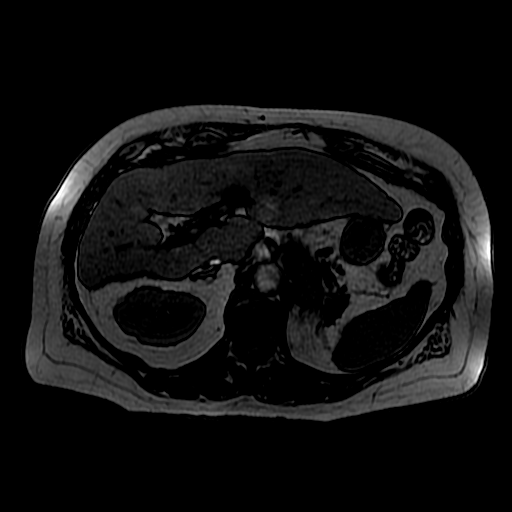
[im 86/86]
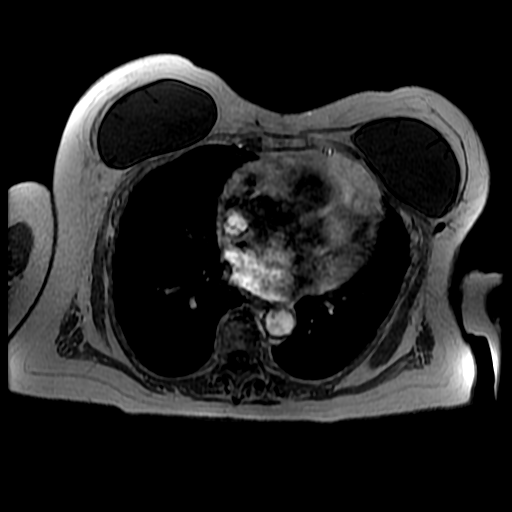

[Series 9: cor dualecho · coronal · 5.0mm · 0.86mm/px · 3 of 94 slices shown]
[im 1/94]
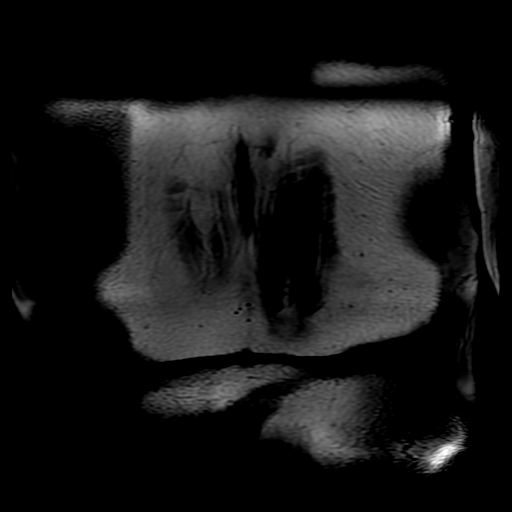
[im 47/94]
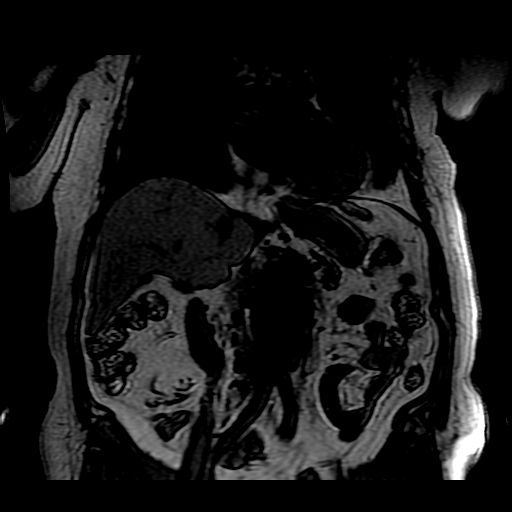
[im 94/94]
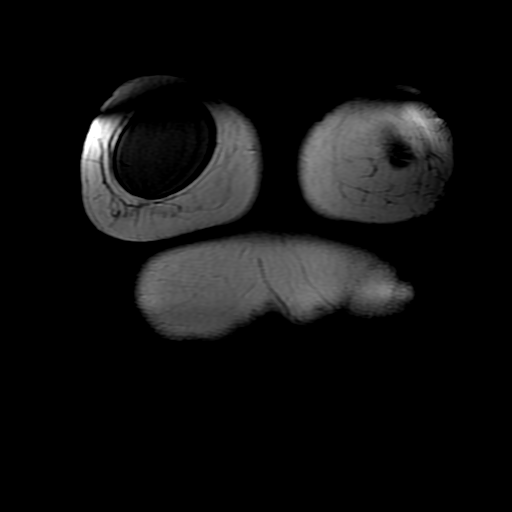

[Series 10: T2 · axial · 5.0mm · 0.78mm/px · 1 of 43 slices shown (1 of 2)]
[im 1/43]
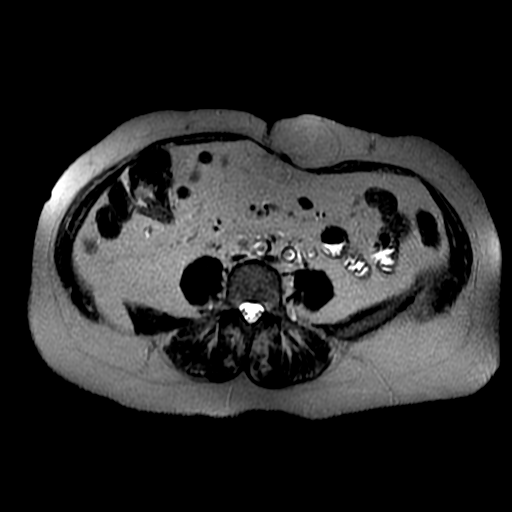

[Series 11: T2 · coronal · 5.0mm · 0.78mm/px · 1 of 38 slices shown (2 of 2)]
[im 1/38]
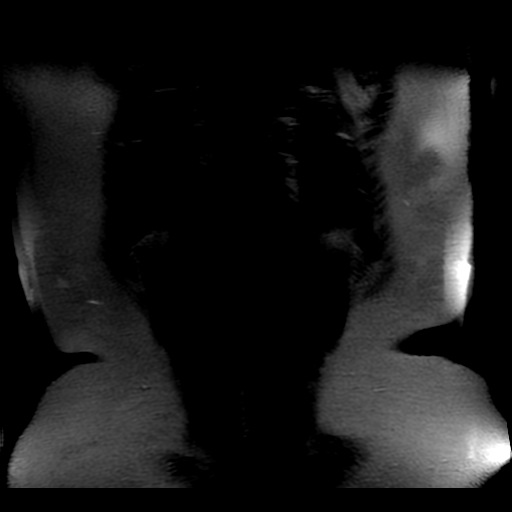

[Series 12: bSSFP · axial · 5.0mm · 0.78mm/px · 1 of 43 slices shown]
[im 1/43]
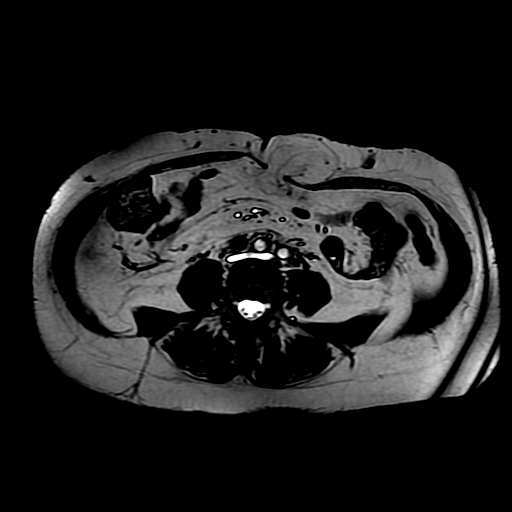

[Series 500: DWI · axial · 6.0mm · 1.48mm/px · 1 of 31 slices shown]
[im 1/31]
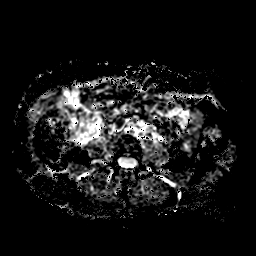

[Series 1300: T1 dynamic · axial · 5.0mm · 0.78mm/px · z∈[-70,+148]mm · 3 of 88 slices shown (1 of 2)]
[im 1/88]
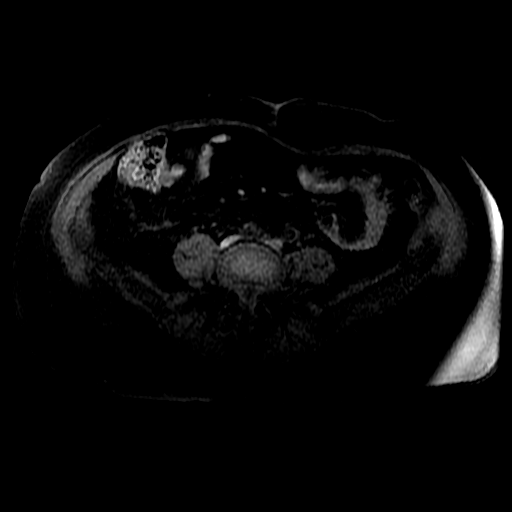
[im 44/88]
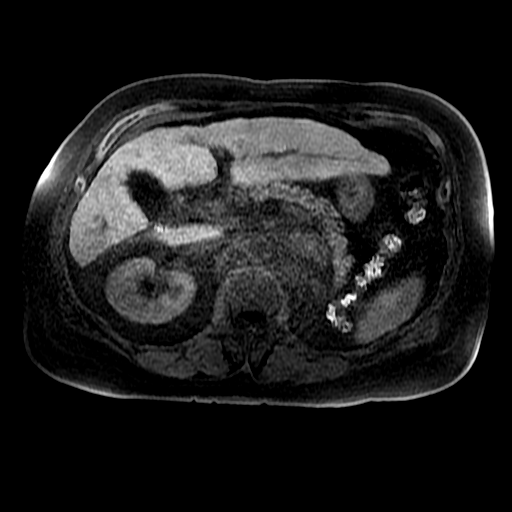
[im 88/88]
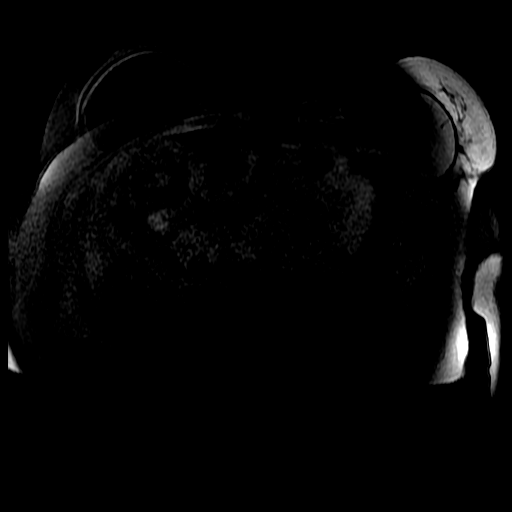

[Series 1301: T1 dynamic · axial · 5.0mm · 0.78mm/px · z∈[-70,+38]mm · 2 of 88 slices shown (2 of 2)]
[im 1/88]
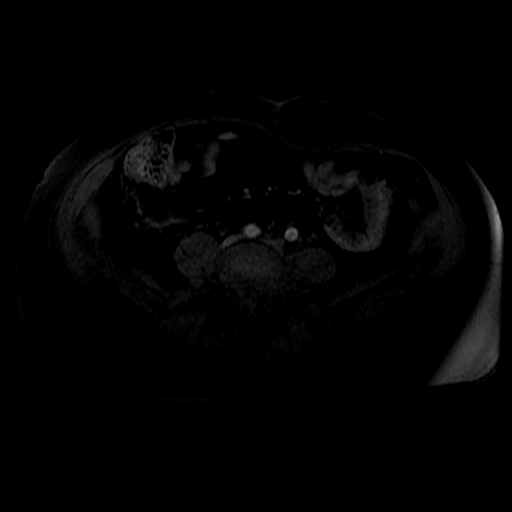
[im 44/88]
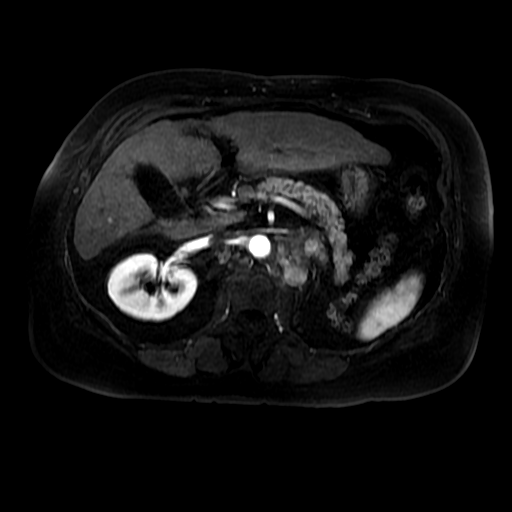

[20 of 48 positions shown; findings below may reference images not displayed]

FINDINGS: Lower chest: Bilateral breast implants incidentally noted.
Otherwise, unremarkable.

Hepatobiliary: The liver has a nodular contour, suggestive of
underlying cirrhosis. No discrete cystic or solid hepatic lesions
are identified. No intra or extrahepatic biliary ductal dilatation.
Gallbladder is normal in appearance.

Pancreas: No pancreatic mass. No pancreatic ductal dilatation. No
pancreatic or peripancreatic fluid or inflammatory changes.

Spleen: Unremarkable.

Adrenals/Urinary Tract: 4.7 x 2.5 cm mass in the left adrenal gland
demonstrates heterogeneous enhancement (image 39 of series 7919),
compatible with a metastatic lesion. This lesion is inseparable from
the adjacent left para-aortic lymphadenopathy. Right adrenal gland
and right kidney are normal in appearance. Left kidney is not
visualized, presumably surgically resected or congenitally absent
(no susceptibility artifact from surgical clips are identified). No
right-sided hydroureteronephrosis in the visualized abdomen.

Stomach/Bowel: Visualized portions are unremarkable.

Vascular/Lymphatic: No aneurysm identified in the visualized
abdominal vasculature. Bulky of left-sided para-aortic
lymphadenopathy again noted, measuring up to 4.7 x 3.9 cm (image 50
of series 7919), extending cephalad from the level of the aortic
bifurcation to the upper retroperitoneum. Mildly enlarged right
common iliac lymph node measuring 1 cm in short axis also noted.

Other: No significant volume of ascites in the visualized peritoneal
cavity.

Musculoskeletal: Aggressive appearing lesion in the L2 vertebral
body anteriorly is low T1 signal intensity, heterogeneously
hyperintense T2 signal intensity, and avid enhancement, compatible
with a metastatic lesion.
IMPRESSION: 1. Bulky metastatic lymphadenopathy in the left para-aortic nodal
stations and right common iliac station appears slightly increased
in number and bulk compared to prior PET-CT 06/17/2015, presumably
reflective of progression of metastatic adenopathy.
2. 4.7 x 2.5 cm left adrenal mass is unchanged, also likely
metastatic.
3. Aggressive appearing osseous lesion in the L2 vertebral body is
similar to the prior examination in terms of size, also likely
metastatic.
4. Left kidney is not visualized, either surgically absent or
congenitally absent.

## 2017-08-17 ENCOUNTER — Other Ambulatory Visit: Payer: Self-pay | Admitting: Nurse Practitioner
